# Patient Record
Sex: Female | Born: 1950 | Race: White | Hispanic: No | Smoking: Never smoker
Health system: Southern US, Community
[De-identification: ages and names within clinical notes are randomized; demographics above are authoritative.]

## PROBLEM LIST (undated history)

## (undated) DIAGNOSIS — C801 Malignant (primary) neoplasm, unspecified: Secondary | ICD-10-CM

## (undated) DIAGNOSIS — M810 Age-related osteoporosis without current pathological fracture: Secondary | ICD-10-CM

## (undated) DIAGNOSIS — K635 Polyp of colon: Secondary | ICD-10-CM

## (undated) DIAGNOSIS — D649 Anemia, unspecified: Secondary | ICD-10-CM

## (undated) DIAGNOSIS — H33329 Round hole, unspecified eye: Secondary | ICD-10-CM

## (undated) HISTORY — DX: Polyp of colon: K63.5

## (undated) HISTORY — PX: LAPAROSCOPIC HYSTERECTOMY: SHX1926

## (undated) HISTORY — PX: HAND / FINGER LESION EXCISION: SUR531

## (undated) HISTORY — DX: Age-related osteoporosis without current pathological fracture: M81.0

## (undated) HISTORY — PX: DILATION AND CURETTAGE OF UTERUS: SHX78

---

## 1978-03-24 HISTORY — PX: BILATERAL SALPINGECTOMY: SHX5743

## 1978-03-24 HISTORY — PX: TUBAL LIGATION: SHX77

## 1997-06-02 ENCOUNTER — Ambulatory Visit (HOSPITAL_COMMUNITY): Admission: RE | Admit: 1997-06-02 | Discharge: 1997-06-02 | Payer: Self-pay | Admitting: *Deleted

## 1998-07-20 ENCOUNTER — Other Ambulatory Visit: Admission: RE | Admit: 1998-07-20 | Discharge: 1998-07-20 | Payer: Self-pay | Admitting: *Deleted

## 1999-08-25 ENCOUNTER — Other Ambulatory Visit: Admission: RE | Admit: 1999-08-25 | Discharge: 1999-08-25 | Payer: Self-pay | Admitting: *Deleted

## 1999-12-02 ENCOUNTER — Emergency Department (HOSPITAL_COMMUNITY): Admission: EM | Admit: 1999-12-02 | Discharge: 1999-12-02 | Payer: Self-pay | Admitting: Emergency Medicine

## 2000-08-29 ENCOUNTER — Other Ambulatory Visit: Admission: RE | Admit: 2000-08-29 | Discharge: 2000-08-29 | Payer: Self-pay | Admitting: *Deleted

## 2001-09-30 ENCOUNTER — Other Ambulatory Visit: Admission: RE | Admit: 2001-09-30 | Discharge: 2001-09-30 | Payer: Self-pay | Admitting: Obstetrics and Gynecology

## 2002-07-14 ENCOUNTER — Ambulatory Visit (HOSPITAL_COMMUNITY): Admission: RE | Admit: 2002-07-14 | Discharge: 2002-07-14 | Payer: Self-pay | Admitting: Gastroenterology

## 2002-10-14 ENCOUNTER — Other Ambulatory Visit: Admission: RE | Admit: 2002-10-14 | Discharge: 2002-10-14 | Payer: Self-pay | Admitting: Obstetrics and Gynecology

## 2005-03-01 ENCOUNTER — Encounter: Admission: RE | Admit: 2005-03-01 | Discharge: 2005-03-01 | Payer: Self-pay | Admitting: Obstetrics and Gynecology

## 2006-01-29 ENCOUNTER — Encounter: Admission: RE | Admit: 2006-01-29 | Discharge: 2006-01-29 | Payer: Self-pay | Admitting: Obstetrics and Gynecology

## 2006-02-20 ENCOUNTER — Encounter: Admission: RE | Admit: 2006-02-20 | Discharge: 2006-02-20 | Payer: Self-pay | Admitting: Obstetrics and Gynecology

## 2007-02-27 ENCOUNTER — Observation Stay (HOSPITAL_COMMUNITY): Admission: EM | Admit: 2007-02-27 | Discharge: 2007-03-01 | Payer: Self-pay | Admitting: Emergency Medicine

## 2007-02-27 ENCOUNTER — Encounter (INDEPENDENT_AMBULATORY_CARE_PROVIDER_SITE_OTHER): Payer: Self-pay | Admitting: Surgery

## 2007-02-27 HISTORY — PX: APPENDECTOMY: SHX54

## 2007-04-01 ENCOUNTER — Encounter: Admission: RE | Admit: 2007-04-01 | Discharge: 2007-04-01 | Payer: Self-pay | Admitting: Obstetrics and Gynecology

## 2008-03-24 IMAGING — MG MM SCREEN MAMMOGRAM BILATERAL
5 series · 5 of 5 positions shown · non-contrast
Comparison: none

DG SCREEN MAMMOGRAM BILATERAL
Bilateral CC and MLO view(s) were taken.

DIGITAL SCREENING MAMMOGRAM WITH CAD:
The breast tissue is heterogeneously dense.  No masses or malignant type calcifications are 
identified.  Compared with prior studies.

[R CC]
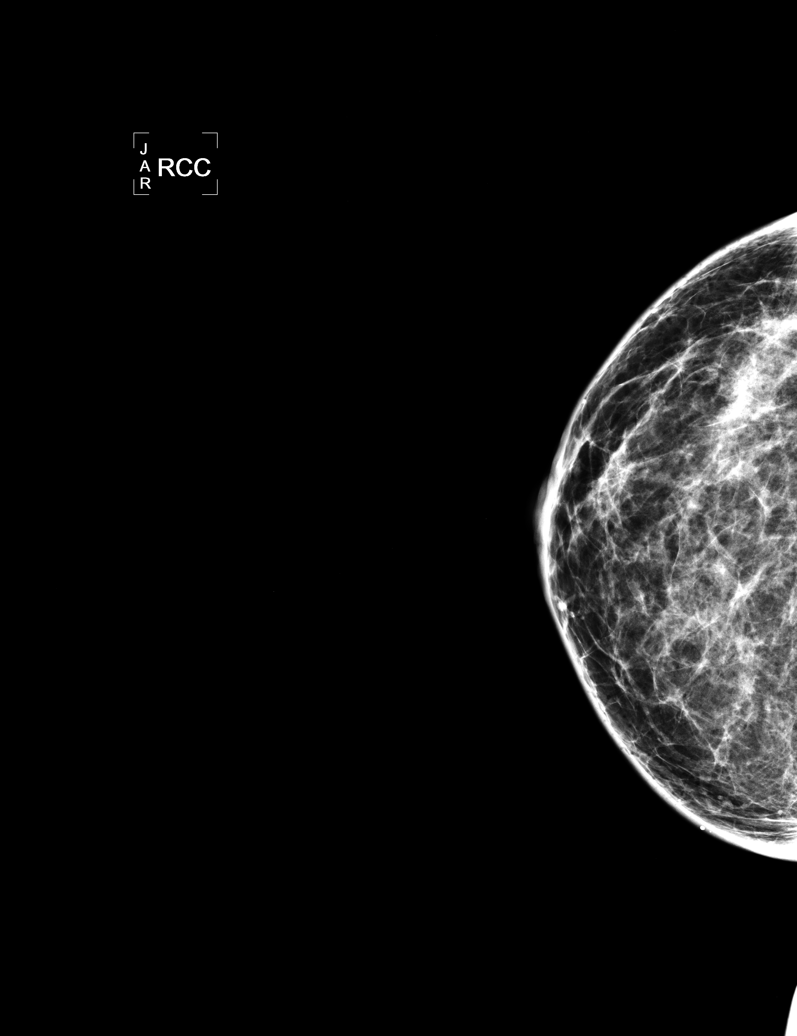

[L CC]
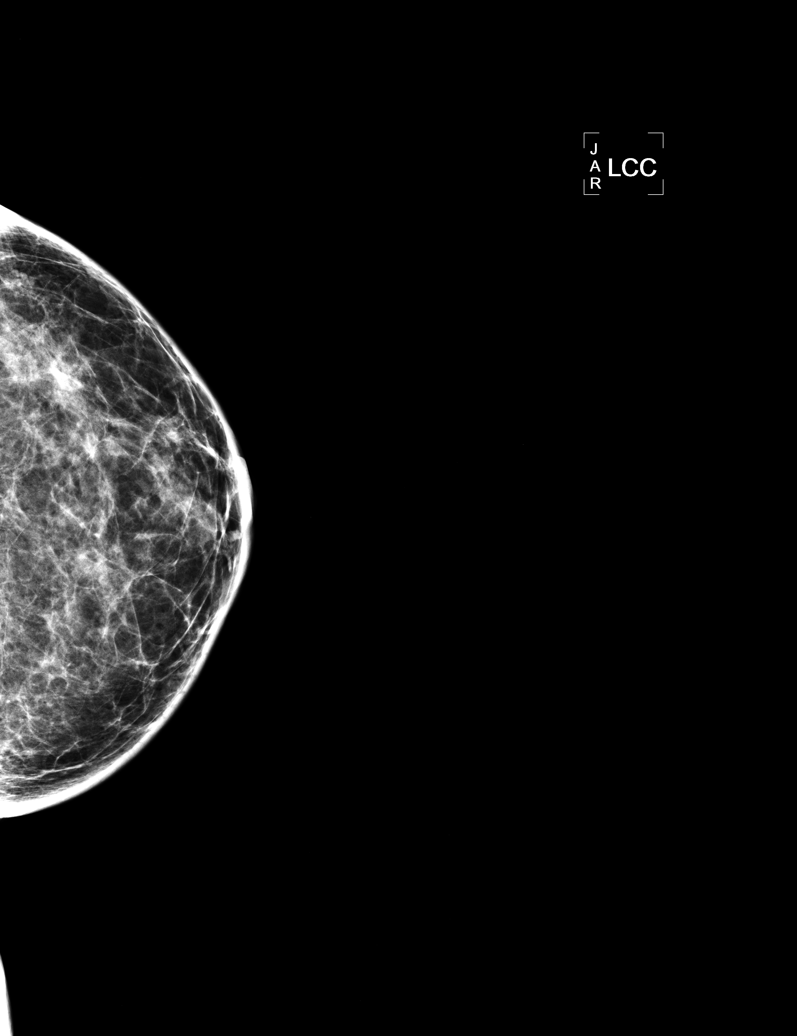

[L MLO (1 of 2)]
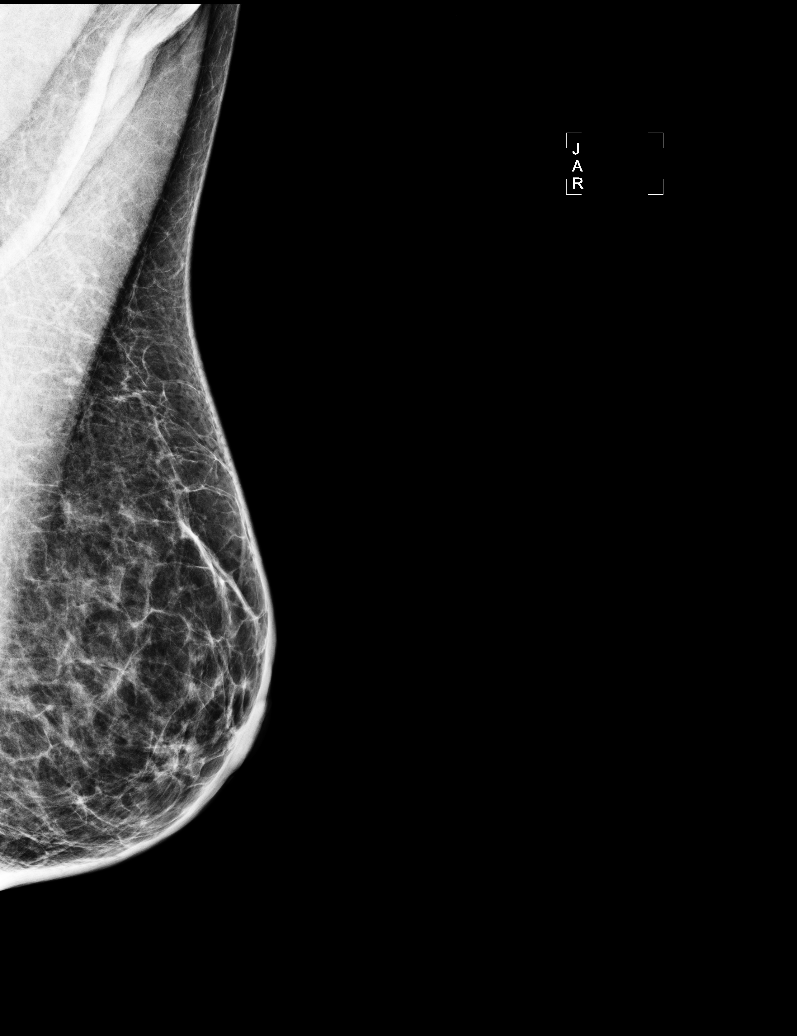

[R MLO]
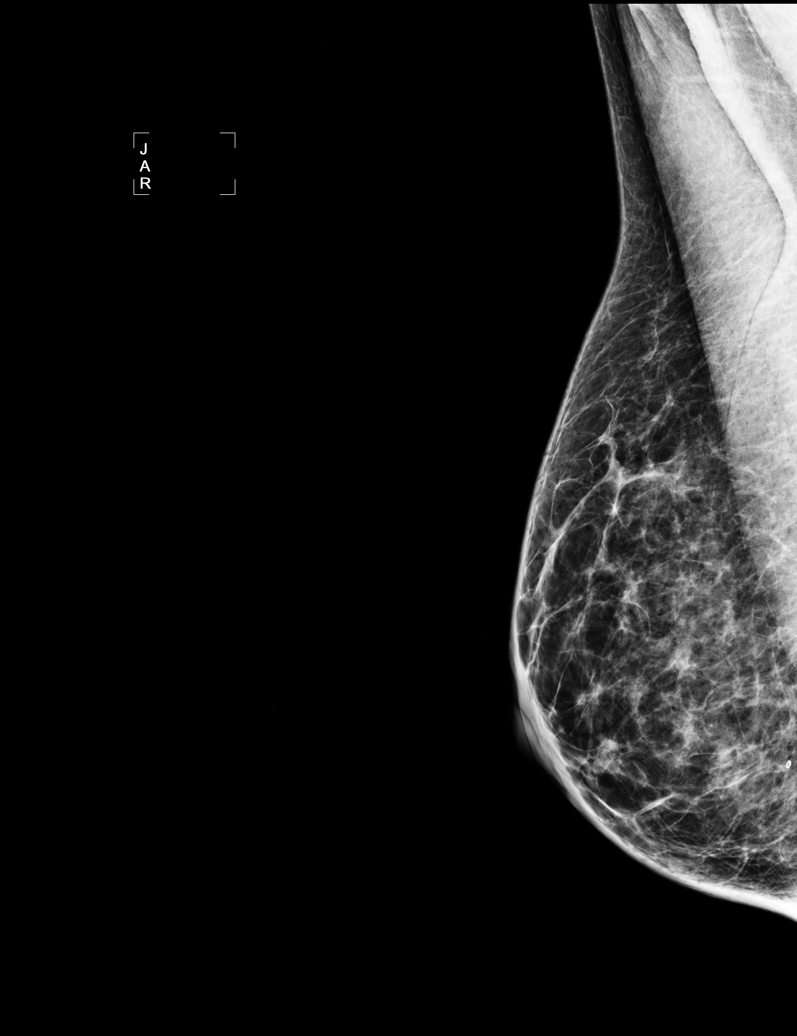

[L MLO (2 of 2)]
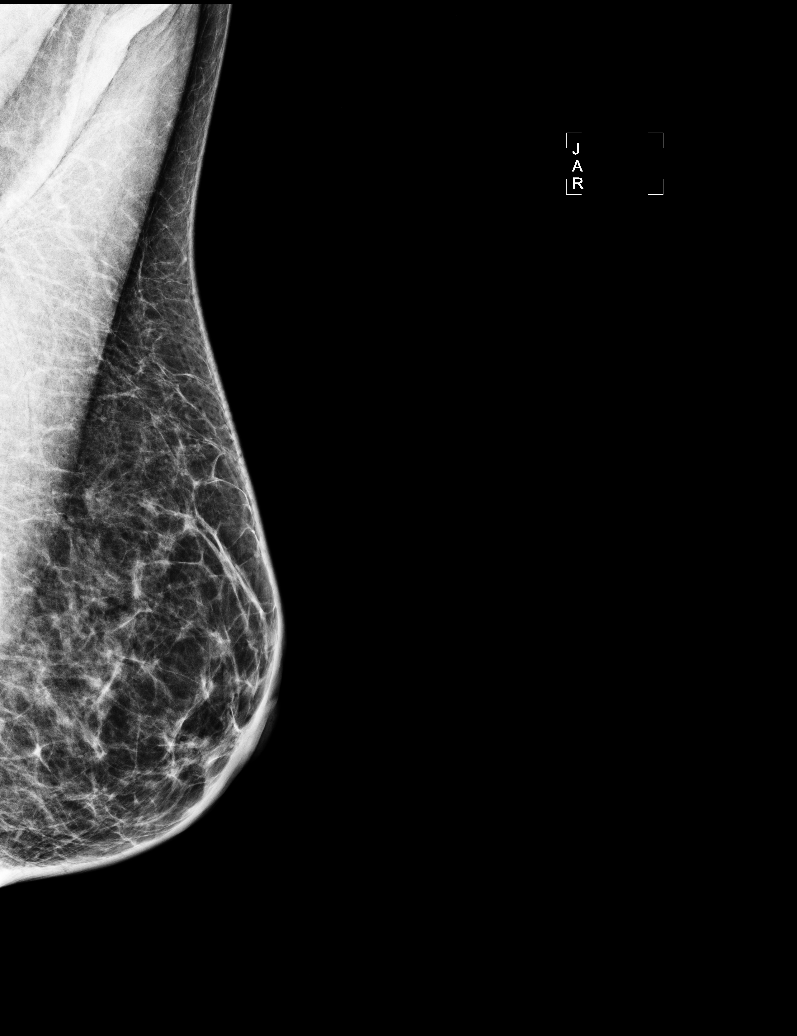

[5 of 5 positions shown; findings below may reference images not displayed]

IMPRESSION: No specific mammographic evidence of malignancy.  Next screening mammogram is recommended in one 
year.

ASSESSMENT: Negative - BI-RADS 1

Screening mammogram in 1 year.
ANALYZED BY COMPUTER AIDED DETECTION. , THIS PROCEDURE WAS A DIGITAL MAMMOGRAM.

## 2008-04-02 ENCOUNTER — Encounter: Admission: RE | Admit: 2008-04-02 | Discharge: 2008-04-02 | Payer: Self-pay | Admitting: Obstetrics and Gynecology

## 2009-04-06 ENCOUNTER — Encounter: Admission: RE | Admit: 2009-04-06 | Discharge: 2009-04-06 | Payer: Self-pay | Admitting: Obstetrics and Gynecology

## 2010-04-07 ENCOUNTER — Encounter
Admission: RE | Admit: 2010-04-07 | Discharge: 2010-04-07 | Payer: Self-pay | Source: Home / Self Care | Attending: Internal Medicine | Admitting: Internal Medicine

## 2010-05-14 ENCOUNTER — Encounter: Payer: Self-pay | Admitting: Obstetrics and Gynecology

## 2010-09-06 NOTE — Op Note (Signed)
NAMEDENA, ESPERANZA             ACCOUNT NO.:  [id number]   MEDICAL RECORD NO.:  [id number]          PATIENT TYPE:  INP   LOCATION:  0105                         FACILITY:  [facility]   PHYSICIAN:  Thornton Park. Daphine Deutscher, MD  DATE OF BIRTH:  Apr 16, 1951   DATE OF PROCEDURE:  02/27/2007  DATE OF DISCHARGE:                               OPERATIVE REPORT   PREOPERATIVE DIAGNOSIS:  Acute appendicitis.   POSTOPERATIVE DIAGNOSIS:  Acute suppurative appendicitis.   PROCEDURE:  Laparoscopic appendectomy.   SURGEON:  Thornton Park. Daphine Deutscher, M.D.   ANESTHESIA:  General.   DESCRIPTION OF PROCEDURE:  The patient was taken to room #1 the evening  of February 27, 2007 and given general anesthesia.  She received Mefoxin  1 gram preop.  The abdomen was prepped with Techni-Care and draped  sterilely.  A longitudinal incision was made down to the umbilicus  through which I inserted an applied medical Hassan-type trocar.  It was  insufflated.  Her cecum was noted to be very floppy and was very  distended, probably from her CT contrast.  Under laparoscopic vision, I  inserted a 5 mm trocar in the right upper quadrant and a 10/11 in the  left lower quadrant and then put the 0 degree scope in from the left  side.  The appendix itself was stuck and was fused to the wall on the  lateral aspect and I teased this up with a Kingsley Spittle and then was able to  identify the mesentery and went across that with the Harmonic scalpel.  This isolated the base and which I then transected with the Endo-GIA  with a vascular cartridge.  The appendix had one little attachment of  mesentery.  I transected that with the Harmonic and then placed the  appendix in a bag and brought it out through the umbilicus.  I inspected  it and then a small appendiceal stump seen both on the inside and there  was a small a suppurative appendix on the outside.  I irrigated the base  of the cecum and looked for evidence of bleeding and none was seen.  I   irrigated the pelvis and withdrew the irrigant.  Everything appeared  viable and there was no evidence of bleeding.  I then repaired the  umbilical defect with a figure-of-eight suture of 0 Vicryl and use a  laparoscope to check the area after it had been repaired.  I injected  the ports with Marcaine and then deflated the abdomen, closed skin with  4-0 Vicryl with Benzoin and Steri-Strips.  The patient tolerated  procedure well and was taken recovery room in satisfactory condition.      Thornton Park Daphine Deutscher, MD  Electronically Signed     MBM/MEDQ  D:  02/27/2007  T:  02/28/2007  Job:  [id number]

## 2010-09-09 NOTE — Op Note (Signed)
   NAME:  Robin Sellers, Robin Sellers                       ACCOUNT NO.:  [id number]   MEDICAL RECORD NO.:  [id number]                   PATIENT TYPE:  AMB   LOCATION:  ENDO                                 FACILITY:  [facility]   PHYSICIAN:  James L. Malon Kindle., M.D.          DATE OF BIRTH:  1951/04/05   DATE OF PROCEDURE:  07/14/2002  DATE OF DISCHARGE:                                 OPERATIVE REPORT   PROCEDURE:  Colonoscopy.   MEDICATIONS:  Fentanyl 75 mcg, Versed 6 mg IV.   INDICATIONS FOR PROCEDURE:  Heme positive stool in a 60 year old.   SCOPE:  Olympus pediatric colonoscope.   DESCRIPTION OF PROCEDURE:  The procedure had been explained to the patient  and consent obtained. With the patient in the left lateral decubitus  position, the pediatric video colonoscope was inserted and advanced under  direct visualization. The prep was excellent. We reached the transverse  colon without a great deal of difficulty, had trouble advancing from there  and we finally went ahead with the patient on her right side and advanced to  the cecum. The ileocecal valve and appendiceal orifice were seen. The  ileocecal valve was entered for a short distance and the terminal ileum  appeared normal. The scope was withdrawn and the cecum, ascending colon,  transverse colon, descending and sigmoid colon were seen well and no  significant diverticular disease.  No polyps were seen throughout. The scope  was withdrawn down to the rectum. The rectum was free of polyps. The scope  was retroflexed, there were some small internal hemorrhoids. The scope  withdrawn. The patient tolerated the procedure well.   ASSESSMENT:  Heme positive stool the only significant finding, internal  hemorrhoids.   PLAN:  Will give her a hemorrhoid instruction sheet, see back in the office  in three months and recheck her stools.                                                James L. Malon Kindle., M.D.    Waldron Session  D:  07/14/2002   T:  07/14/2002  Job:  [id number]   cc:   Gretta Arab. Valentina Lucks, M.D.  [address] [place]  [place] [address]  Fax: [phone]

## 2010-11-25 ENCOUNTER — Ambulatory Visit (HOSPITAL_BASED_OUTPATIENT_CLINIC_OR_DEPARTMENT_OTHER)
Admission: RE | Admit: 2010-11-25 | Discharge: 2010-11-25 | Disposition: A | Discharge status: Home / Self Care | Payer: 59 | Source: Ambulatory Visit | Attending: Orthopedic Surgery | Admitting: Orthopedic Surgery

## 2010-11-25 DIAGNOSIS — D1610 Benign neoplasm of short bones of unspecified upper limb: Secondary | ICD-10-CM | POA: Insufficient documentation

## 2010-11-25 DIAGNOSIS — M19049 Primary osteoarthritis, unspecified hand: Secondary | ICD-10-CM | POA: Insufficient documentation

## 2010-11-25 DIAGNOSIS — M6740 Ganglion, unspecified site: Secondary | ICD-10-CM | POA: Insufficient documentation

## 2010-11-29 NOTE — Op Note (Signed)
NAMEJAQUELINE, [name]             ACCOUNT NO.:  [id number]  MEDICAL RECORD NO.:  [id number]  LOCATION:                                 FACILITY:  PHYSICIAN:  Katy Fitch. Bertice Risse, M.D.      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  OPERATION:  Debridement of mucoid cyst followed by DIP joint arthrotomy, synovectomy, and osteophyte resection.  OPERATING SURGEON:  Katy Fitch. Zayed Griffie, MD  ASSISTANT:  Marveen Reeks Dasnoit, PA-C  ANESTHESIA:  2% lidocaine at metacarpal head level block, left index finger.  This was performed as a minor operating room procedure.  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  She has had prior drainage without relief of the cyst.  She has background osteoarthritis with marginal osteophyte formation at the base of the left index finger distal phalanx.  We explained her the spectrum of treatment options for mucoid cyst.  The highest level of success was achieved by DIP joint debridement with removal of osteophytes and joint irrigation.  Questions regarding the anticipated procedure were invited and answered in detail both in the office and in the holding area prior to surgery.  Questions were invited and answered.  PROCEDURE:  Robin Sellers was brought to room #1 of the [facility] and placed in supine position on the operating table.  Following informed consent and Betadine prep, 2% lidocaine was infiltrated at metacarpal head level to  obtain a digital block.  After 5 minutes, excellent anesthesia was achieved and confirmed by direct stimulation.  The left arm was then prepped with Betadine soap solution and sterilely draped.  Following a routine surgical time-out, the finger was exsanguinated with a gauze wrap and a 0.5-inch Penrose drain placed over the proximal phalangeal segment as digital tourniquet.  Procedure commenced with a curvilinear incision incorporating the cyst distally.  Subcutaneous tissues were carefully divided raising skin flaps allowing exposure of the mucin collection.  The mucin was debrided with a rongeur down to normal-appearing dermis.  A dorsoulnar arthrotomy was accomplished by resection of a triangular portion of the capsule between the terminal extensor tendon slip and the ulnar collateral ligament.  A large osteophyte at the base of distal phalanx was immediately evident.  This was removed piecemeal with a rongeur and a microcurette.  A micro-Freer elevator was used to palpate deep to the extensor mechanism looking for cartilaginous loose bodies.  The joint was then irrigated with a blunt dental needle and syringe under pressure.  Hemostasis was achieved followed by repair of the wound with mattress suture of 5-0 nylon.  There were no apparent complications.  Robin Sellers tolerated the surgery and anesthesia well.  For aftercare, she is provided prescriptions for Keflex 500 mg one p.o. q.8 h. x4 days as a prophylactic  antibiotic with the indication for antibiotics joint entry.  She is also provided Vicodin 5 mg one p.o. q. 4-6 h. p.r.n. pain, 20 tablets without refill.     Katy Fitch Wanita Derenzo, M.D.     RVS/MEDQ  D:  11/25/2010  T:  11/25/2010  Job:  [id number]  Electronically Signed by Josephine Igo M.D. on 11/29/2010 08:22:17 AM

## 2011-01-31 LAB — COMPREHENSIVE METABOLIC PANEL
ALT: 13
AST: 19
Albumin: 4.2
Alkaline Phosphatase: 36 — ABNORMAL LOW
BUN: 14
CO2: 28
Calcium: 9.6
Chloride: 102
Creatinine, Ser: 0.82
GFR calc Af Amer: >60
GFR calc non Af Amer: >60
Glucose, Bld: 128 — ABNORMAL HIGH
Potassium: 3.8
Sodium: 137
Total Bilirubin: 1.3 — ABNORMAL HIGH
Total Protein: 7.1

## 2011-01-31 LAB — CBC
HCT: 39.7
Hemoglobin: 13.7
MCHC: 34.5
MCV: 88.4
Platelets: 181
RBC: 4.5
RDW: 13.5
WBC: 20.2 — ABNORMAL HIGH

## 2011-01-31 LAB — DIFFERENTIAL
Basophils Absolute: 0.1
Basophils Relative: 1
Eosinophils Absolute: 0
Eosinophils Relative: 0
Lymphocytes Relative: 3 — ABNORMAL LOW
Lymphs Abs: 0.6 — ABNORMAL LOW
Monocytes Absolute: 0.9 — ABNORMAL HIGH
Monocytes Relative: 4
Neutro Abs: 18.6 — ABNORMAL HIGH
Neutrophils Relative %: 92 — ABNORMAL HIGH

## 2011-02-27 ENCOUNTER — Other Ambulatory Visit: Payer: Self-pay | Admitting: Internal Medicine

## 2011-02-27 DIAGNOSIS — Z1231 Encounter for screening mammogram for malignant neoplasm of breast: Secondary | ICD-10-CM

## 2011-04-11 ENCOUNTER — Ambulatory Visit
Admission: RE | Admit: 2011-04-11 | Discharge: 2011-04-11 | Disposition: A | Discharge status: Home / Self Care | Payer: 59 | Source: Ambulatory Visit | Attending: Internal Medicine | Admitting: Internal Medicine

## 2011-04-11 DIAGNOSIS — Z1231 Encounter for screening mammogram for malignant neoplasm of breast: Secondary | ICD-10-CM

## 2011-04-25 HISTORY — PX: RETINAL TEAR REPAIR CRYOTHERAPY: SHX5304

## 2011-10-31 HISTORY — PX: CATARACT EXTRACTION: SUR2

## 2012-03-07 ENCOUNTER — Other Ambulatory Visit: Payer: Self-pay | Admitting: Internal Medicine

## 2012-03-07 DIAGNOSIS — Z1231 Encounter for screening mammogram for malignant neoplasm of breast: Secondary | ICD-10-CM

## 2012-04-01 ENCOUNTER — Ambulatory Visit: Payer: 59

## 2012-04-03 ENCOUNTER — Ambulatory Visit
Admission: RE | Admit: 2012-04-03 | Discharge: 2012-04-03 | Disposition: A | Discharge status: Home / Self Care | Payer: 59 | Source: Ambulatory Visit | Attending: Internal Medicine | Admitting: Internal Medicine

## 2012-04-03 DIAGNOSIS — Z1231 Encounter for screening mammogram for malignant neoplasm of breast: Secondary | ICD-10-CM

## 2012-04-12 ENCOUNTER — Other Ambulatory Visit: Payer: Self-pay | Admitting: Obstetrics and Gynecology

## 2012-04-12 DIAGNOSIS — M8580 Other specified disorders of bone density and structure, unspecified site: Secondary | ICD-10-CM

## 2013-01-07 ENCOUNTER — Other Ambulatory Visit: Payer: 59

## 2013-02-12 ENCOUNTER — Ambulatory Visit
Admission: RE | Admit: 2013-02-12 | Discharge: 2013-02-12 | Disposition: A | Discharge status: Home / Self Care | Payer: 59 | Source: Ambulatory Visit | Attending: Obstetrics and Gynecology | Admitting: Obstetrics and Gynecology

## 2013-02-12 DIAGNOSIS — M8580 Other specified disorders of bone density and structure, unspecified site: Secondary | ICD-10-CM

## 2013-02-24 ENCOUNTER — Other Ambulatory Visit: Payer: Self-pay

## 2013-02-24 DIAGNOSIS — Z1231 Encounter for screening mammogram for malignant neoplasm of breast: Secondary | ICD-10-CM

## 2013-04-04 ENCOUNTER — Ambulatory Visit
Admission: RE | Admit: 2013-04-04 | Discharge: 2013-04-04 | Disposition: A | Discharge status: Home / Self Care | Payer: 59 | Source: Ambulatory Visit

## 2013-04-04 DIAGNOSIS — Z1231 Encounter for screening mammogram for malignant neoplasm of breast: Secondary | ICD-10-CM

## 2014-03-03 ENCOUNTER — Other Ambulatory Visit: Payer: Self-pay

## 2014-03-03 DIAGNOSIS — Z1231 Encounter for screening mammogram for malignant neoplasm of breast: Secondary | ICD-10-CM

## 2014-04-06 ENCOUNTER — Ambulatory Visit
Admission: RE | Admit: 2014-04-06 | Discharge: 2014-04-06 | Disposition: A | Discharge status: Home / Self Care | Payer: BC Managed Care – PPO | Source: Ambulatory Visit

## 2014-04-06 DIAGNOSIS — Z1231 Encounter for screening mammogram for malignant neoplasm of breast: Secondary | ICD-10-CM

## 2015-03-01 ENCOUNTER — Other Ambulatory Visit: Payer: Self-pay

## 2015-03-01 DIAGNOSIS — Z1231 Encounter for screening mammogram for malignant neoplasm of breast: Secondary | ICD-10-CM

## 2015-04-08 ENCOUNTER — Ambulatory Visit
Admission: RE | Admit: 2015-04-08 | Discharge: 2015-04-08 | Disposition: A | Discharge status: Home / Self Care | Payer: PRIVATE HEALTH INSURANCE | Source: Ambulatory Visit

## 2015-04-08 DIAGNOSIS — Z1231 Encounter for screening mammogram for malignant neoplasm of breast: Secondary | ICD-10-CM

## 2016-02-21 ENCOUNTER — Other Ambulatory Visit: Payer: Self-pay | Admitting: Internal Medicine

## 2016-02-21 DIAGNOSIS — Z1231 Encounter for screening mammogram for malignant neoplasm of breast: Secondary | ICD-10-CM

## 2016-04-11 ENCOUNTER — Ambulatory Visit
Admission: RE | Admit: 2016-04-11 | Discharge: 2016-04-11 | Disposition: A | Discharge status: Home / Self Care | Payer: PRIVATE HEALTH INSURANCE | Source: Ambulatory Visit | Attending: Internal Medicine | Admitting: Internal Medicine

## 2016-04-11 DIAGNOSIS — Z1231 Encounter for screening mammogram for malignant neoplasm of breast: Secondary | ICD-10-CM

## 2017-02-14 ENCOUNTER — Other Ambulatory Visit: Payer: Self-pay | Admitting: Internal Medicine

## 2017-02-14 DIAGNOSIS — Z1231 Encounter for screening mammogram for malignant neoplasm of breast: Secondary | ICD-10-CM

## 2017-04-04 ENCOUNTER — Other Ambulatory Visit: Payer: Self-pay | Admitting: Obstetrics and Gynecology

## 2017-04-05 ENCOUNTER — Other Ambulatory Visit: Payer: Self-pay | Admitting: Obstetrics and Gynecology

## 2017-04-05 DIAGNOSIS — M810 Age-related osteoporosis without current pathological fracture: Secondary | ICD-10-CM

## 2017-04-12 ENCOUNTER — Ambulatory Visit
Admission: RE | Admit: 2017-04-12 | Discharge: 2017-04-12 | Disposition: A | Discharge status: Home / Self Care | Payer: Medicare Other | Source: Ambulatory Visit | Attending: Internal Medicine | Admitting: Internal Medicine

## 2017-04-12 DIAGNOSIS — Z1231 Encounter for screening mammogram for malignant neoplasm of breast: Secondary | ICD-10-CM

## 2018-03-08 ENCOUNTER — Other Ambulatory Visit: Payer: Self-pay | Admitting: Internal Medicine

## 2018-03-08 DIAGNOSIS — Z1231 Encounter for screening mammogram for malignant neoplasm of breast: Secondary | ICD-10-CM

## 2018-03-14 ENCOUNTER — Other Ambulatory Visit: Payer: Self-pay | Admitting: Gastroenterology

## 2018-04-01 ENCOUNTER — Ambulatory Visit (HOSPITAL_COMMUNITY)
Admission: RE | Admit: 2018-04-01 | Discharge: 2018-04-01 | Disposition: A | Discharge status: Home / Self Care | Payer: Medicare Other | Source: Ambulatory Visit | Attending: Gastroenterology | Admitting: Gastroenterology

## 2018-04-01 ENCOUNTER — Other Ambulatory Visit: Payer: Self-pay

## 2018-04-01 ENCOUNTER — Encounter (HOSPITAL_COMMUNITY): Payer: Self-pay | Admitting: Emergency Medicine

## 2018-04-01 ENCOUNTER — Encounter (HOSPITAL_COMMUNITY)
Admission: RE | Disposition: A | Discharge status: Home / Self Care | Payer: Self-pay | Source: Ambulatory Visit | Attending: Gastroenterology

## 2018-04-01 ENCOUNTER — Ambulatory Visit (HOSPITAL_COMMUNITY): Payer: Medicare Other | Admitting: Certified Registered"

## 2018-04-01 DIAGNOSIS — D122 Benign neoplasm of ascending colon: Secondary | ICD-10-CM | POA: Diagnosis not present

## 2018-04-01 DIAGNOSIS — D120 Benign neoplasm of cecum: Secondary | ICD-10-CM | POA: Diagnosis not present

## 2018-04-01 DIAGNOSIS — Z86010 Personal history of colonic polyps: Secondary | ICD-10-CM | POA: Insufficient documentation

## 2018-04-01 DIAGNOSIS — K621 Rectal polyp: Secondary | ICD-10-CM | POA: Diagnosis not present

## 2018-04-01 DIAGNOSIS — Z79899 Other long term (current) drug therapy: Secondary | ICD-10-CM | POA: Insufficient documentation

## 2018-04-01 HISTORY — PX: POLYPECTOMY: SHX5525

## 2018-04-01 HISTORY — PX: COLONOSCOPY WITH PROPOFOL: SHX5780

## 2018-04-01 HISTORY — PX: SUBMUCOSAL INJECTION: SHX5543

## 2018-04-01 SURGERY — COLONOSCOPY WITH PROPOFOL
Anesthesia: Monitor Anesthesia Care

## 2018-04-01 MED ORDER — PROPOFOL 10 MG/ML IV BOLUS
INTRAVENOUS | Status: AC
  Filled 2018-04-01: qty 80

## 2018-04-01 MED ORDER — SODIUM CHLORIDE 0.9 % IV SOLN: INTRAVENOUS | Status: DC

## 2018-04-01 MED ORDER — PROPOFOL 500 MG/50ML IV EMUL
INTRAVENOUS | Status: DC | PRN
  Administered 2018-04-01: 135 ug/kg/min via INTRAVENOUS

## 2018-04-01 MED ORDER — SPOT INK MARKER SYRINGE KIT
PACK | SUBMUCOSAL | Status: AC
  Filled 2018-04-01: qty 5

## 2018-04-01 MED ORDER — SPOT INK MARKER SYRINGE KIT
PACK | SUBMUCOSAL | Status: DC | PRN
  Administered 2018-04-01: 2 mL via SUBMUCOSAL

## 2018-04-01 MED ORDER — PROPOFOL 10 MG/ML IV BOLUS
INTRAVENOUS | Status: DC | PRN
  Administered 2018-04-01 (×2): 20 mg via INTRAVENOUS
  Administered 2018-04-01: 40 mg via INTRAVENOUS

## 2018-04-01 MED ORDER — LACTATED RINGERS IV SOLN
INTRAVENOUS | Status: DC
  Administered 2018-04-01: 08:00:00 via INTRAVENOUS

## 2018-04-01 SURGICAL SUPPLY — 22 items

## 2018-04-01 NOTE — Transfer of Care (Signed)
Immediate Anesthesia Transfer of Care Note  Patient: Robin Sellers  Procedure(s) Performed: COLONOSCOPY WITH PROPOFOL (N/A ) POLYPECTOMY  Patient Location: PACU  Anesthesia Type:MAC  Level of Consciousness: awake, alert  and oriented  Airway & Oxygen Therapy: Patient Spontanous Breathing and Patient connected to face mask oxygen  Post-op Assessment: Report given to RN and Post -op Vital signs reviewed and stable  Post vital signs: Reviewed and stable  Last Vitals:  Vitals Value Taken Time  BP 108/36 04/01/2018  9:46 AM  Temp    Pulse    Resp 17 04/01/2018  9:47 AM  SpO2    Vitals shown include unvalidated device data.  Last Pain:  Vitals:   04/01/18 0726  TempSrc: Oral         Complications: No apparent anesthesia complications

## 2018-04-01 NOTE — Anesthesia Procedure Notes (Signed)
Date/Time: 04/01/2018 8:33 AM Performed by: Niel Hummer, CRNA Pre-anesthesia Checklist: Patient being monitored, Suction available, Emergency Drugs available and Patient identified Oxygen Delivery Method: Nasal cannula

## 2018-04-01 NOTE — Op Note (Signed)
[facility] Patient Name: Robin Sellers Procedure Date: 04/01/2018 MRN: [id number] Attending MD: Otis Brace , MD Date of Birth: March 23, 1951 CSN: [id number] Age: 67 Admit Type: Outpatient Procedure:                Colonoscopy Indications:              Therapeutic procedure for known colon polyps Providers:                Otis Brace, MD, Cleda Daub, RN, Cherylynn Ridges, Technician Referring MD:              Medicines:                Sedation Administered by an Anesthesia Professional Complications:            No immediate complications. Estimated Blood Loss:     Estimated blood loss was minimal. Procedure:                Pre-Anesthesia Assessment:                           - Prior to the procedure, a History and Physical                            was performed, and patient medications and                            allergies were reviewed. The patient's tolerance of                            previous anesthesia was also reviewed. The risks                            and benefits of the procedure and the sedation                            options and risks were discussed with the patient.                            All questions were answered, and informed consent                            was obtained. Prior Anticoagulants: The patient has                            taken no previous anticoagulant or antiplatelet                            agents. ASA Grade Assessment: II - A patient with                            mild systemic disease. After reviewing the risks  and benefits, the patient was deemed in                            satisfactory condition to undergo the procedure.                           After obtaining informed consent, the colonoscope                            was passed under direct vision. Throughout the                            procedure, the patient's blood pressure, pulse,  and                            oxygen saturations were monitored continuously. The                            PCF-H190DL ([id number]) Olympus peds colonscope was                            introduced through the anus and advanced to the the                            cecum, identified by appendiceal orifice and                            ileocecal valve. The colonoscopy was performed                            without difficulty. The patient tolerated the                            procedure well. The quality of the bowel                            preparation was good. Scope In: 8:41:10 AM Scope Out: 9:39:55 AM Scope Withdrawal Time: 0 hours 51 minutes 3 seconds  Total Procedure Duration: 0 hours 58 minutes 45 seconds  Findings:      The perianal and digital rectal examinations were normal.      moderately friable mucosa with contact bleeding was found in the entire       colon.      A 4 mm polyp was found in the cecum. The polyp was sessile. The polyp       was removed with a hot snare. Resection and retrieval were complete.      A 20 mm polyp was found in the proximal ascending colon. The polyp was       sessile. Area was unsuccessfully injected with Eleview for a lift       polypectomy. The polyp was removed with a piecemeal technique using a       hot snare. Resection and retrieval were complete. To close a defect       after polypectomy, three hemostatic clips were successfully placed. Area       was tattooed  with an injection of Spot (carbon black).      A 10 mm polyp was found in the distal ascending colon. The polyp was       sessile. The polyp was removed with a piecemeal technique using a hot       snare. Resection and retrieval were complete.      A 7 mm polyp was found in the rectum. The polyp was sessile. The polyp       was removed with a hot snare. Resection and retrieval were complete.      The retroflexed view of the distal rectum and anal verge was normal and        showed no anal or rectal abnormalities. Impression:               - One 4 mm polyp in the cecum, removed with a hot                            snare. Resected and retrieved.                           - One 20 mm polyp in the proximal ascending colon,                            removed piecemeal using a hot snare. Resected and                            retrieved. Treatment not successful. Clips were                            placed. Tattooed.                           - One 10 mm polyp in the distal ascending colon,                            removed piecemeal using a hot snare. Resected and                            retrieved.                           - One 7 mm polyp in the rectum, removed with a hot                            snare. Resected and retrieved. Moderate Sedation:      Moderate (conscious) sedation was personally administered by an       anesthesia professional. The following parameters were monitored: oxygen       saturation, heart rate, blood pressure, and response to care. Recommendation:           - Patient has a contact number available for                            emergencies. The signs and symptoms of potential  delayed complications were discussed with the                            patient. Return to normal activities tomorrow.                            Written discharge instructions were provided to the                            patient.                           - Resume previous diet.                           - Continue present medications.                           - Await pathology results.                           - Repeat colonoscopy date to be determined after                            pending pathology results are reviewed for                            surveillance based on pathology results.                           - Return to my office PRN. Procedure Code(s):        --- Professional ---                            [id number], Colonoscopy, flexible; with removal of                            tumor(s), polyp(s), or other lesion(s) by snare                            technique                           45381, Colonoscopy, flexible; with directed                            submucosal injection(s), any substance Diagnosis Code(s):        --- Professional ---                           D12.0, Benign neoplasm of cecum                           D12.2, Benign neoplasm of ascending colon                           K62.1, Rectal polyp  K63.5, Polyp of colon CPT copyright 2018 American Medical Association. All rights reserved. The codes documented in this report are preliminary and upon coder review may  be revised to meet current compliance requirements. Otis Brace, MD Otis Brace, MD 04/01/2018 9:49:46 AM Number of Addenda: 0

## 2018-04-01 NOTE — H&P (Signed)
Referring Provider:  Oletta Lamas Primary Care Physician:  Lorene Dy, MD Primary Gastroenterologist:  Dr. Oletta Lamas   Reason for Visit : Large Ascending colon polyp   HPI: Robin Sellers is a 67 y.o. female underwent surveillance colonoscopy on February 28, 2018 with Dr. Oletta Lamas and was found to have multiple polyps.  There was one large, 20 mm, sessile polyp in the ascending colon which was not removed.  She is undergoing another colonoscopy today for removal of large polyp.  She denies any GI symptoms today.  History reviewed. No pertinent past medical history.  History reviewed. No pertinent surgical history.  Prior to Admission medications   Medication Sig Start Date End Date Taking? Authorizing Provider  Ascorbic Acid (VITAMIN C) 1000 MG tablet Take 1,000 mg by mouth 4 (four) times a week.   Yes [provider]  Calcium Carb-Cholecalciferol (CALCIUM+D3 PO) Take 1 tablet by mouth 4 (four) times a week.   Yes [provider]  Cholecalciferol (D3 SUPER STRENGTH) 50 MCG (2000 UT) CAPS Take 2,000 Units by mouth 4 (four) times a week.   Yes [provider]  Multiple Vitamin (MULTIVITAMIN WITH MINERALS) TABS tablet Take 1 tablet by mouth 4 (four) times a week. WITH BIOTIN   Yes [provider]    Scheduled Meds: Continuous Infusions: . sodium chloride    . lactated ringers 125 mL/hr at 04/01/18 0741   PRN Meds:.  Allergies as of 03/14/2018  . (Not on File)    Family History  Problem Relation Age of Onset  . Breast cancer Neg Hx     Social History   Socioeconomic History  . Marital status: Legally Separated    Spouse name: Not on file  . Number of children: Not on file  . Years of education: Not on file  . Highest education level: Not on file  Occupational History  . Not on file  Social Needs  . Financial resource strain: Not on file  . Food insecurity:    Worry: Not on file    Inability: Not on file  . Transportation needs:     Medical: Not on file    Non-medical: Not on file  Tobacco Use  . Smoking status: Not on file  Substance and Sexual Activity  . Alcohol use: Not on file  . Drug use: Not on file  . Sexual activity: Not on file  Lifestyle  . Physical activity:    Days per week: Not on file    Minutes per session: Not on file  . Stress: Not on file  Relationships  . Social connections:    Talks on phone: Not on file    Gets together: Not on file    Attends religious service: Not on file    Active member of club or organization: Not on file    Attends meetings of clubs or organizations: Not on file    Relationship status: Not on file  . Intimate partner violence:    Fear of current or ex partner: Not on file    Emotionally abused: Not on file    Physically abused: Not on file    Forced sexual activity: Not on file  Other Topics Concern  . Not on file  Social History Narrative  . Not on file    Review of Systems: All negative except as stated above in HPI.  Physical Exam: Vital signs: Vitals:   04/01/18 0726  BP: (!) 119/56  Pulse: 68  Resp: 10  Temp:  98.8 F (37.1 C)  SpO2: 94%     General:   Alert,  Well-developed, well-nourished, pleasant and cooperative in NAD Lungs:  Clear throughout to auscultation.   No wheezes, crackles, or rhonchi. No acute distress. Heart:  Regular rate and rhythm; no murmurs, clicks, rubs,  or gallops. Abdomen: Soft, nontender, nondistended, bowel sounds present. Rectal:  Deferred  GI:  Lab Results: No results for input(s): WBC, HGB, HCT, PLT in the last 72 hours. BMET No results for input(s): NA, K, CL, CO2, GLUCOSE, BUN, CREATININE, CALCIUM in the last 72 hours. LFT No results for input(s): PROT, ALBUMIN, AST, ALT, ALKPHOS, BILITOT, BILIDIR, IBILI in the last 72 hours. PT/INR No results for input(s): LABPROT, INR in the last 72 hours.   Studies/Results: No results found.  Impression/Plan: -Large ascending colon  polyp.  Recommendations ------------------------- - Colonoscopy  with possible removal of large polyps today. -Increased risk of bleeding and perforation associated with removal of large polyp discussed with the patient.  Possibility of incomplete resection also discussed with the patient.  If not able to remove polyp completely, she may need to go to tertiary center for complete resection.  Risks (bleeding, infection, bowel perforation that could require surgery, sedation-related changes in cardiopulmonary systems), benefits (identification and possible treatment of source of symptoms, exclusion of certain causes of symptoms), and alternatives (watchful waiting, radiographic imaging studies, empiric medical treatment)  were explained to patient in detail and patient wishes to proceed.    LOS: 0 days   Otis Brace  MD, FACP 04/01/2018, 8:23 AM  Contact #  [phone]

## 2018-04-01 NOTE — Discharge Instructions (Signed)

## 2018-04-01 NOTE — Anesthesia Preprocedure Evaluation (Signed)
Anesthesia Evaluation  Patient identified by MRN, date of birth, ID band Patient awake    Reviewed: Allergy & Precautions, NPO status , Patient's Chart, lab work & pertinent test results  Airway Mallampati: I  TM Distance: >3 FB Neck ROM: Full    Dental  (+) Teeth Intact, Dental Advisory Given   Pulmonary neg pulmonary ROS,    Pulmonary exam normal breath sounds clear to auscultation       Cardiovascular Exercise Tolerance: Good negative cardio ROS Normal cardiovascular exam Rhythm:Regular Rate:Normal     Neuro/Psych negative neurological ROS  negative psych ROS   GI/Hepatic Neg liver ROS, Large Polyp of colon   Endo/Other  negative endocrine ROS  Renal/GU negative Renal ROS     Musculoskeletal negative musculoskeletal ROS (+)   Abdominal   Peds  Hematology negative hematology ROS (+)   Anesthesia Other Findings Day of surgery medications reviewed with the patient.  Reproductive/Obstetrics                             Anesthesia Physical Anesthesia Plan  ASA: I  Anesthesia Plan: MAC   Post-op Pain Management:    Induction: Intravenous  PONV Risk Score and Plan: 2 and Propofol infusion and Treatment may vary due to age or medical condition  Airway Management Planned: Nasal Cannula and Natural Airway  Additional Equipment:   Intra-op Plan:   Post-operative Plan:   Informed Consent: I have reviewed the patients History and Physical, chart, labs and discussed the procedure including the risks, benefits and alternatives for the proposed anesthesia with the patient or authorized representative who has indicated his/her understanding and acceptance.   Dental advisory given  Plan Discussed with: CRNA and Anesthesiologist  Anesthesia Plan Comments:         Anesthesia Quick Evaluation

## 2018-04-02 NOTE — Anesthesia Postprocedure Evaluation (Signed)
Anesthesia Post Note  Patient: Robin Sellers  Procedure(s) Performed: COLONOSCOPY WITH PROPOFOL (N/A ) POLYPECTOMY SUBMUCOSAL INJECTION HEMOSTASIS CLIP PLACEMENT     Patient location during evaluation: PACU Anesthesia Type: MAC Level of consciousness: awake and alert, awake and oriented Pain management: pain level controlled Vital Signs Assessment: post-procedure vital signs reviewed and stable Respiratory status: spontaneous breathing, nonlabored ventilation and respiratory function stable Cardiovascular status: stable and blood pressure returned to baseline Postop Assessment: no apparent nausea or vomiting Anesthetic complications: no    Last Vitals:  Vitals:   04/01/18 0950 04/01/18 1000  BP: (!) 113/56 (!) 120/52  Pulse: (!) 57 (!) 53  Resp: 18 17  Temp:    SpO2: 100% 100%    Last Pain:  Vitals:   04/01/18 1000  TempSrc:   PainSc: 0-No pain                 Catalina Gravel

## 2018-04-04 ENCOUNTER — Encounter (HOSPITAL_COMMUNITY): Payer: Self-pay | Admitting: Gastroenterology

## 2018-04-23 ENCOUNTER — Ambulatory Visit
Admission: RE | Admit: 2018-04-23 | Discharge: 2018-04-23 | Disposition: A | Discharge status: Home / Self Care | Payer: Medicare Other | Source: Ambulatory Visit | Attending: Internal Medicine | Admitting: Internal Medicine

## 2018-04-23 DIAGNOSIS — Z1231 Encounter for screening mammogram for malignant neoplasm of breast: Secondary | ICD-10-CM

## 2019-03-14 ENCOUNTER — Other Ambulatory Visit: Payer: Self-pay | Admitting: Internal Medicine

## 2019-03-14 DIAGNOSIS — Z1231 Encounter for screening mammogram for malignant neoplasm of breast: Secondary | ICD-10-CM

## 2019-03-24 ENCOUNTER — Other Ambulatory Visit: Payer: Self-pay | Admitting: Internal Medicine

## 2019-03-24 DIAGNOSIS — E2839 Other primary ovarian failure: Secondary | ICD-10-CM

## 2019-03-25 HISTORY — PX: COLONOSCOPY: SHX174

## 2019-05-08 ENCOUNTER — Ambulatory Visit
Admission: RE | Admit: 2019-05-08 | Discharge: 2019-05-08 | Disposition: A | Discharge status: Home / Self Care | Payer: Medicare Other | Source: Ambulatory Visit | Attending: Internal Medicine | Admitting: Internal Medicine

## 2019-05-08 ENCOUNTER — Other Ambulatory Visit: Payer: Self-pay

## 2019-05-08 DIAGNOSIS — Z1231 Encounter for screening mammogram for malignant neoplasm of breast: Secondary | ICD-10-CM

## 2019-06-19 ENCOUNTER — Other Ambulatory Visit: Payer: Self-pay

## 2019-06-19 ENCOUNTER — Ambulatory Visit
Admission: RE | Admit: 2019-06-19 | Discharge: 2019-06-19 | Disposition: A | Discharge status: Home / Self Care | Payer: Medicare Other | Source: Ambulatory Visit | Attending: Internal Medicine | Admitting: Internal Medicine

## 2019-06-19 DIAGNOSIS — E2839 Other primary ovarian failure: Secondary | ICD-10-CM

## 2020-03-29 ENCOUNTER — Other Ambulatory Visit: Payer: Self-pay | Admitting: Internal Medicine

## 2020-03-29 DIAGNOSIS — Z1231 Encounter for screening mammogram for malignant neoplasm of breast: Secondary | ICD-10-CM

## 2020-05-12 ENCOUNTER — Other Ambulatory Visit: Payer: Self-pay

## 2020-05-12 ENCOUNTER — Ambulatory Visit
Admission: RE | Admit: 2020-05-12 | Discharge: 2020-05-12 | Disposition: A | Discharge status: Home / Self Care | Payer: Medicare Other | Source: Ambulatory Visit | Attending: Internal Medicine | Admitting: Internal Medicine

## 2020-05-12 DIAGNOSIS — Z1231 Encounter for screening mammogram for malignant neoplasm of breast: Secondary | ICD-10-CM

## 2020-08-21 ENCOUNTER — Other Ambulatory Visit: Payer: Self-pay

## 2020-08-21 ENCOUNTER — Emergency Department (HOSPITAL_COMMUNITY): Payer: Medicare Other

## 2020-08-21 ENCOUNTER — Emergency Department (HOSPITAL_COMMUNITY)
Admission: EM | Admit: 2020-08-21 | Discharge: 2020-08-22 | Disposition: A | Discharge status: Home / Self Care | Payer: Medicare Other | Attending: Emergency Medicine | Admitting: Emergency Medicine

## 2020-08-21 DIAGNOSIS — R109 Unspecified abdominal pain: Secondary | ICD-10-CM | POA: Diagnosis present

## 2020-08-21 DIAGNOSIS — R1011 Right upper quadrant pain: Secondary | ICD-10-CM | POA: Diagnosis not present

## 2020-08-21 LAB — CBC WITH DIFFERENTIAL/PLATELET
Abs Immature Granulocytes: 0.02 10*3/uL (ref 0.00–0.07)
Basophils Absolute: 0.1 10*3/uL (ref 0.0–0.1)
Basophils Relative: 1 %
Eosinophils Absolute: 0.1 10*3/uL (ref 0.0–0.5)
Eosinophils Relative: 1 %
HCT: 40.9 % (ref 36.0–46.0)
Hemoglobin: 13.2 g/dL (ref 12.0–15.0)
Immature Granulocytes: 0 %
Lymphocytes Relative: 22 %
Lymphs Abs: 2 10*3/uL (ref 0.7–4.0)
MCH: 29.4 pg (ref 26.0–34.0)
MCHC: 32.3 g/dL (ref 30.0–36.0)
MCV: 91.1 fL (ref 80.0–100.0)
Monocytes Absolute: 0.7 10*3/uL (ref 0.1–1.0)
Monocytes Relative: 7 %
Neutro Abs: 6.3 10*3/uL (ref 1.7–7.7)
Neutrophils Relative %: 69 %
Platelets: 203 10*3/uL (ref 150–400)
RBC: 4.49 MIL/uL (ref 3.87–5.11)
RDW: 12.1 % (ref 11.5–15.5)
WBC: 9.2 10*3/uL (ref 4.0–10.5)
nRBC: 0 % (ref 0.0–0.2)

## 2020-08-21 LAB — COMPREHENSIVE METABOLIC PANEL
ALT: 19 U/L (ref 0–44)
AST: 26 U/L (ref 15–41)
Albumin: 4.6 g/dL (ref 3.5–5.0)
Alkaline Phosphatase: 41 U/L (ref 38–126)
Anion gap: 7 (ref 5–15)
BUN: 15 mg/dL (ref 8–23)
CO2: 30 mmol/L (ref 22–32)
Calcium: 9.4 mg/dL (ref 8.9–10.3)
Chloride: 103 mmol/L (ref 98–111)
Creatinine, Ser: 0.77 mg/dL (ref 0.44–1.00)
GFR, Estimated: >60 mL/min (ref >60)
Glucose, Bld: 81 mg/dL (ref 70–99)
Potassium: 3.9 mmol/L (ref 3.5–5.1)
Sodium: 140 mmol/L (ref 135–145)
Total Bilirubin: 0.9 mg/dL (ref 0.3–1.2)
Total Protein: 7.8 g/dL (ref 6.5–8.1)

## 2020-08-21 LAB — URINALYSIS, ROUTINE W REFLEX MICROSCOPIC
Bacteria, UA: NONE SEEN
Bilirubin Urine: NEGATIVE
Glucose, UA: NEGATIVE mg/dL
Ketones, ur: NEGATIVE mg/dL
Leukocytes,Ua: NEGATIVE
Nitrite: NEGATIVE
Protein, ur: NEGATIVE mg/dL
Specific Gravity, Urine: 1.003 — ABNORMAL LOW (ref 1.005–1.030)
pH: 8 (ref 5.0–8.0)

## 2020-08-21 LAB — LIPASE, BLOOD: Lipase: 34 U/L (ref 11–51)

## 2020-08-21 NOTE — ED Provider Notes (Addendum)
[facility] DEPT Provider Note   CSN: [id number] Arrival date & time: 08/21/20  1745     History Chief Complaint  Patient presents with  . Abdominal Pain    Robin Sellers is a 70 y.o. female.  HPI    70 year old female comes in a chief complaint of abdominal pain Patient reports that she started having severe abdominal pain last night around 2 AM.  She had some discomfort at 1030 before she went to bed.  The pain was severe, right-sided and there was no radiation of it.  The pain is since calmed down and now is a constant dull pain.  She denies any direct association with p.o. intake.  There is no associated nausea, vomiting, fevers, chills, UTI-like symptoms and there is no history of kidney stone or similar pain in the past. Of note, patient reports that she had noted that her pain was worse with deep inspiration or with cough.  Pt has no hx of PE, DVT and denies any exogenous hormone (testosterone / estrogen) use, long distance travels or surgery in the past 6 weeks, active cancer, recent immobilization.   No past medical history on file.  There are no problems to display for this patient.   Past Surgical History:  Procedure Laterality Date  . COLONOSCOPY WITH PROPOFOL N/A 04/01/2018   Procedure: COLONOSCOPY WITH PROPOFOL;  Surgeon: Otis Brace, MD;  Location: [facility];  Service: Gastroenterology;  Laterality: N/A;  . POLYPECTOMY  04/01/2018   Procedure: POLYPECTOMY;  Surgeon: Otis Brace, MD;  Location: [facility];  Service: Gastroenterology;;  . SUBMUCOSAL INJECTION  04/01/2018   Procedure: SUBMUCOSAL INJECTION;  Surgeon: Otis Brace, MD;  Location: [facility];  Service: Gastroenterology;;     OB History   No obstetric history on file.     Family History  Problem Relation Age of Onset  . Breast cancer Neg Hx        Home Medications Prior to Admission medications   Medication Sig Start Date End  Date Taking? Authorizing Provider  BIOTIN PO Take 1 tablet by mouth daily.   Yes [provider]  Calcium Carb-Cholecalciferol (CALCIUM+D3 PO) Take 1 tablet by mouth daily.   Yes [provider]  Cholecalciferol 50 MCG (2000 UT) CAPS Take 2,000 Units by mouth daily.   Yes [provider]  Multiple Vitamin (MULTIVITAMIN WITH MINERALS) TABS tablet Take 1 tablet by mouth daily. WITH BIOTIN   Yes [provider]    Allergies    Philippa Sicks scolymus (artichoke)]  Review of Systems   Review of Systems  Constitutional: Positive for activity change.  Respiratory: Negative for shortness of breath.   Cardiovascular: Negative for chest pain.  Gastrointestinal: Positive for abdominal pain.  Allergic/Immunologic: Negative for immunocompromised state.  Hematological: Does not bruise/bleed easily.  All other systems reviewed and are negative.   Physical Exam Updated Vital Signs BP (!) 145/78   Pulse 80   Temp 98.2 F (36.8 C) (Oral)   Resp 18   Ht 5\' 4"  (1.626 m)   Wt 50.8 kg   SpO2 98%   BMI 19.22 kg/m   Physical Exam Vitals and nursing note reviewed.  Constitutional:      Appearance: She is well-developed.  HENT:     Head: Normocephalic and atraumatic.  Cardiovascular:     Rate and Rhythm: Normal rate.  Pulmonary:     Effort: Pulmonary effort is normal.  Abdominal:     General: Bowel sounds are normal.  Tenderness: There is abdominal tenderness in the right upper quadrant. There is no guarding or rebound. Negative signs include Murphy's sign.  Musculoskeletal:     Cervical back: Normal range of motion and neck supple.  Skin:    General: Skin is warm and dry.  Neurological:     Mental Status: She is alert and oriented to person, place, and time.     ED Results / Procedures / Treatments   Labs (all labs ordered are listed, but only abnormal results are displayed) Labs Reviewed  URINALYSIS, ROUTINE W REFLEX MICROSCOPIC -  Abnormal; Notable for the following components:      Result Value   Color, Urine STRAW (*)    Specific Gravity, Urine 1.003 (*)    Hgb urine dipstick SMALL (*)    All other components within normal limits  CBC WITH DIFFERENTIAL/PLATELET  COMPREHENSIVE METABOLIC PANEL  LIPASE, BLOOD    EKG None  Radiology DG Chest 2 View  Result Date: 08/21/2020 CLINICAL DATA:  Chest pain EXAM: CHEST - 2 VIEW COMPARISON:  None. FINDINGS: The heart size and mediastinal contours are within normal limits. Both lungs are clear. The visualized skeletal structures are unremarkable. IMPRESSION: No active cardiopulmonary disease. Electronically Signed   By: Constance Holster M.D.   On: 08/21/2020 22:59   US Abdomen Limited RUQ (LIVER/GB)  Result Date: 08/21/2020 CLINICAL DATA:  Initial evaluation for acute right upper quadrant pain. EXAM: ULTRASOUND ABDOMEN LIMITED RIGHT UPPER QUADRANT COMPARISON:  None. FINDINGS: Gallbladder: No gallstones or wall thickening visualized. No sonographic Murphy sign noted by sonographer. Common bile duct: Diameter: 4.3 mm Liver: No focal lesion identified. Within normal limits in parenchymal echogenicity. Portal vein is patent on color Doppler imaging with normal direction of blood flow towards the liver. Other: None. IMPRESSION: Normal right upper quadrant ultrasound. Electronically Signed   By: Jeannine Boga M.D.   On: 08/21/2020 20:46    Procedures Procedures   Medications Ordered in ED Medications - No data to display  ED Course  I have reviewed the triage vital signs and the nursing notes.  Pertinent labs & imaging results that were available during my care of the patient were reviewed by me and considered in my medical decision making (see chart for details).    MDM Rules/Calculators/A&P                          70 year old comes in w/ chief complaint of abdominal pain. Initial concern was for hepatobiliary process.  Ultrasound and labs however are  completely reassuring.  No evidence of gallstone, sludge.  LFTs are normal, lipase is normal.  X-ray ordered because of this nonspecific pleuritic component and it is not revealing any evidence of pleural effusion, infiltrate.  Plan is to manage the patient conservatively for now.  She will see her PCP.  We have recommended taking antacids to see if that helps with the pain.  If her pain gets worse or she starts developing other symptoms, she will return to the ER and we will have to consider advanced imaging including CT scan.   Final Clinical Impression(s) / ED Diagnoses Final diagnoses:  RUQ pain    Rx / DC Orders ED Discharge Orders    None       Varney Biles, MD 08/21/20 2319

## 2020-08-21 NOTE — Discharge Instructions (Addendum)
Please come to the ER if you start having worsening abdominal pain, fevers, severe nausea and vomiting.  It might be a good idea to take antacids like Tums the next few days and see your primary care doctor in 1 week. Please try to keep a log of the pain -quality, pattern, timing, association to specific events like food intake -that might further help him with the diagnosis.  Take over-the-counter pain medicine for pain control for now.

## 2020-08-21 NOTE — ED Triage Notes (Signed)
Emergency Medicine Provider Triage Evaluation Note  Robin Sellers , a 70 y.o. female  was evaluated in triage.  Pt complains of RUQ pain that started last night. She notes she was having right-sided pain 2 weeks ago which resolved; however began having RUQ pain last night. No relationship to meals. No fever, chills, nausea, and vomiting. No overlying rash. She had a previous appendectomy, but no other operations.    Review of Systems  Positive: Abdominal pain Negative: Nausea, vomiting  Physical Exam  BP 125/68 (BP Location: Left Arm)   Pulse 74   Temp 98.2 F (36.8 C) (Oral)   Resp 18   Ht 5\' 4"  (1.626 m)   Wt 50.8 kg   SpO2 95%   BMI 19.22 kg/m  Gen:   Awake, no distress   HEENT:  Atraumatic  Resp:  Normal effort  Cardiac:  Normal rate  Abd:   Nondistended, RUQ TTP MSK:   Moves extremities without difficulty  Neuro:  Speech clear   Medical Decision Making  Medically screening exam initiated at 6:46 PM.  Appropriate orders placed.  Hamdi Vari was informed that the remainder of the evaluation will be completed by another provider, this initial triage assessment does not replace that evaluation, and the importance of remaining in the ED until their evaluation is complete.  Clinical Impression  RUQ pain. Possible gallbladder etiology. Labs ordered. RUQ [place].    Robin Sellers, [place] 08/21/20 1849

## 2020-08-21 NOTE — ED Triage Notes (Signed)
Patient reports sudden onset RUQ pain starting last night. Pain is still present and dull today. Patient has galbladder. Pain rated 5/10

## 2020-10-04 ENCOUNTER — Other Ambulatory Visit: Payer: Self-pay | Admitting: Internal Medicine

## 2020-10-04 DIAGNOSIS — R1011 Right upper quadrant pain: Secondary | ICD-10-CM

## 2020-10-26 ENCOUNTER — Ambulatory Visit
Admission: RE | Admit: 2020-10-26 | Discharge: 2020-10-26 | Disposition: A | Discharge status: Home / Self Care | Payer: Medicare Other | Source: Ambulatory Visit | Attending: Internal Medicine | Admitting: Internal Medicine

## 2020-10-26 DIAGNOSIS — R1011 Right upper quadrant pain: Secondary | ICD-10-CM

## 2020-10-27 ENCOUNTER — Ambulatory Visit
Admission: RE | Admit: 2020-10-27 | Discharge: 2020-10-27 | Disposition: A | Discharge status: Home / Self Care | Payer: Medicare Other | Source: Ambulatory Visit | Attending: Internal Medicine | Admitting: Internal Medicine

## 2020-10-27 ENCOUNTER — Other Ambulatory Visit: Payer: Self-pay | Admitting: Internal Medicine

## 2020-10-27 DIAGNOSIS — R1011 Right upper quadrant pain: Secondary | ICD-10-CM

## 2020-10-27 MED ORDER — IOPAMIDOL (ISOVUE-300) INJECTION 61%
100.0000 mL | Freq: Once | INTRAVENOUS | Status: AC | PRN
  Administered 2020-10-27: 100 mL via INTRAVENOUS

## 2020-11-01 ENCOUNTER — Encounter: Payer: Self-pay | Admitting: Gynecologic Oncology

## 2020-11-02 ENCOUNTER — Other Ambulatory Visit: Payer: Self-pay

## 2020-11-02 ENCOUNTER — Encounter: Payer: Self-pay | Admitting: Gynecologic Oncology

## 2020-11-02 ENCOUNTER — Inpatient Hospital Stay: Payer: Medicare Other | Attending: Gynecologic Oncology | Admitting: Gynecologic Oncology

## 2020-11-02 ENCOUNTER — Inpatient Hospital Stay: Payer: Medicare Other

## 2020-11-02 VITALS — BP 136/66 | HR 85 | Temp 97.3°F | Resp 16 | Ht 64.0 in | Wt 113.4 lb

## 2020-11-02 DIAGNOSIS — R1011 Right upper quadrant pain: Secondary | ICD-10-CM | POA: Diagnosis not present

## 2020-11-02 DIAGNOSIS — R600 Localized edema: Secondary | ICD-10-CM | POA: Diagnosis not present

## 2020-11-02 DIAGNOSIS — C786 Secondary malignant neoplasm of retroperitoneum and peritoneum: Secondary | ICD-10-CM | POA: Insufficient documentation

## 2020-11-02 DIAGNOSIS — C579 Malignant neoplasm of female genital organ, unspecified: Secondary | ICD-10-CM | POA: Insufficient documentation

## 2020-11-02 DIAGNOSIS — C800 Disseminated malignant neoplasm, unspecified: Secondary | ICD-10-CM

## 2020-11-02 DIAGNOSIS — Z801 Family history of malignant neoplasm of trachea, bronchus and lung: Secondary | ICD-10-CM | POA: Insufficient documentation

## 2020-11-02 DIAGNOSIS — C577 Malignant neoplasm of other specified female genital organs: Secondary | ICD-10-CM

## 2020-11-02 DIAGNOSIS — K5730 Diverticulosis of large intestine without perforation or abscess without bleeding: Secondary | ICD-10-CM | POA: Insufficient documentation

## 2020-11-02 DIAGNOSIS — C569 Malignant neoplasm of unspecified ovary: Secondary | ICD-10-CM

## 2020-11-02 DIAGNOSIS — K668 Other specified disorders of peritoneum: Secondary | ICD-10-CM

## 2020-11-02 DIAGNOSIS — R971 Elevated cancer antigen 125 [CA 125]: Secondary | ICD-10-CM | POA: Diagnosis not present

## 2020-11-02 DIAGNOSIS — Z8543 Personal history of malignant neoplasm of ovary: Secondary | ICD-10-CM | POA: Diagnosis not present

## 2020-11-02 DIAGNOSIS — M810 Age-related osteoporosis without current pathological fracture: Secondary | ICD-10-CM | POA: Insufficient documentation

## 2020-11-02 DIAGNOSIS — Z1211 Encounter for screening for malignant neoplasm of colon: Secondary | ICD-10-CM | POA: Insufficient documentation

## 2020-11-02 DIAGNOSIS — M818 Other osteoporosis without current pathological fracture: Secondary | ICD-10-CM

## 2020-11-02 DIAGNOSIS — Z803 Family history of malignant neoplasm of breast: Secondary | ICD-10-CM | POA: Insufficient documentation

## 2020-11-02 DIAGNOSIS — D398 Neoplasm of uncertain behavior of other specified female genital organs: Secondary | ICD-10-CM

## 2020-11-02 DIAGNOSIS — C482 Malignant neoplasm of peritoneum, unspecified: Secondary | ICD-10-CM | POA: Insufficient documentation

## 2020-11-02 LAB — CEA (IN HOUSE-CHCC): CEA (CHCC-In House): 1.07 ng/mL (ref 0.00–5.00)

## 2020-11-02 MED ORDER — SENNOSIDES-DOCUSATE SODIUM 8.6-50 MG PO TABS: 2.0000 | ORAL_TABLET | Freq: Every day | ORAL | 0 refills | Status: DC

## 2020-11-02 MED ORDER — IBUPROFEN 800 MG PO TABS: 800.0000 mg | ORAL_TABLET | Freq: Three times a day (TID) | ORAL | 0 refills | Status: DC | PRN

## 2020-11-02 MED ORDER — TRAMADOL HCL 50 MG PO TABS: 50.0000 mg | ORAL_TABLET | Freq: Four times a day (QID) | ORAL | 0 refills | Status: DC | PRN

## 2020-11-02 NOTE — Progress Notes (Signed)
GYNECOLOGIC ONCOLOGY NEW PATIENT CONSULTATION   Patient Name: Robin Sellers  Patient Age: 70 y.o. 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  I reviewed in detail the patient's findings on CT scan.  Prior to her visit, I spoke with one of the interventional radiologist given my concern that there may not be any tissue to biopsy.  He agreed that there was not a significant area of either omental or peritoneal disease that would be a good target for percutaneous biopsy.  We discussed the limitations of CT imaging to assess pelvic organs.  I have ordered a pelvic ultrasound to better assess the adnexa.  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  We will plan to get tumor markers including a CA-125 and CEA today.  In the setting of ovarian, fallopian tube, or primary peritoneal carcinoma, I discussed the typical treatment strategy. I discussed that the treatment approach for this disease is typically combination of cytoreductive surgery and chemotherapy. The sequencing can be either with upfront debulking followed by adjuvant chemotherapy sequentially or neoadjuvant chemotherapy followed by an interval cytoreductive attempt, then additional chemotherapy. This latter approach is associated with a reduced perioperative morbidity at the time of surgery.  I discussed that decisions regarding sequencing of therapy is individualized taking into account individual patient health, in addition to the apparent tumor distribution on imaging, and likelihood of complete surgical resection at the time of surgery. I discussed  that the overall survival observed in patients is equivalent for both approaches (neoadjuvant chemotherapy versus primary debulking surgery) provided that there is an optimal cytoreductive effort at the time of surgery (regardless of the timing of that surgery).   Given the need for tissue to establish a diagnosis, I am recommending that we proceed with diagnostic laparoscopy and possible biopsy.  I am concerned optimal cytoreduction may not be possible given CT findings.  If at the time of her diagnostic laparoscopy, complete cytoreduction does seem possible, then I would proceed with tumor debulking (in the event this appears to be a GYN malignancy).  If optimal cytoreduction was not deemed possible, then I would obtain tissue solely for diagnostic purposes with plan for a neoadjuvant approach.  We discussed the plan for a diagnostic laparoscopy, possible biopsy, possible conversion to laparotomy with tumor debulking including total hysterectomy, bilateral salpingo-oophorectomy, and any other indicated procedures. The risks of surgery were discussed in detail and she understands these to include infection; wound separation; hernia; vaginal cuff separation, injury to adjacent organs such as bowel, bladder, blood vessels, ureters and nerves; bleeding which may require blood transfusion; anesthesia risk; thromboembolic events; possible death; unforeseen complications; possible need for re-exploration; medical complications such as heart attack, stroke, pleural effusion and pneumonia; and, if full lymphadenectomy is performed the risk of lymphedema and lymphocyst. The patient will receive DVT and antibiotic prophylaxis as indicated. She voiced a clear understanding. She had the opportunity to ask questions. Perioperative instructions were reviewed with her. Prescriptions for post-op medications were sent to her pharmacy of choice.  The patient was counseled that if she undergoes laparotomy and tumor debulking, the  plan will be for hospital admission and discharged with 4 weeks of prophylactic Lovenox.  A  copy of this note was sent to the patient's referring provider.   90 minutes of total time was spent for this patient encounter, including preparation, face-to-face counseling with the patient and coordination of care, and documentation of the encounter.  Jeral Pinch, MD  Division of Gynecologic Oncology  Department of Obstetrics and Gynecology  [facility]  ___________________________________________  Chief Complaint: Chief Complaint  Patient presents with   Abdominal pain, right upper quadrant   Omental mass    History of Present Illness:  Robin Sellers is a 70 y.o. y.o. female who is seen in consultation at the request of Dr. Mancel Bale for an evaluation of imaging findings concerning for carcinomatosis.  The patient reports intermittent right upper quadrant pain that feel like she pulled a muscle beginning in March 2022.  She would have this pain only if she reached, pulled, or was active in some way.  Occasionally, she would have pain if she laid on her right side.  She denied any radiation of the pain or associated side effects such as nausea or emesis.  In April, she woke up with sharp pain in her right upper quadrant.  This was her first episode of sharp pain.  She thought that her pain was related to her gallbladder and presented to the emergency department.  She was seen in ED on 4/30. RUQ ultrasound and x-ray were normal as were LFTs and lipase.   Endorses a good appetite although somewhat decreased since finding out her CT results.  She denies any early satiety or bloating.  She has occasional "hardness" in her mid upper abdomen which she attributes to eating too much.  She reports normal bowel function.  She denies any urinary symptoms.  She is very healthy without significant medical diagnoses.  Her surgical history is notable for an appendectomy in  2008 performed laparoscopically for appendicitis.  She had an ectopic pregnancy in 1979 and had her right fallopian tube removed and her left tube tied.  Since that surgery, she has had occasional burning or cramping in her right lower quadrant.  She also had 2 D&Cs for miscarriages and a D&C for what sounds like either fibroids or polyps during her reproductive years when she began having mid cycle bleeding.  Patient's family history is notable for mother with lung cancer related to tobacco use and father with skin cancer.  Her daughter was diagnosed with breast cancer at age 64.  Daughter is now 18.  She had negative genetic testing.  There is no known family history of prostate, colon, or gynecologic cancer.  The patient lives in [place] alone.  She comes in with her husband, from who she she is legally separated, today.  They remain close friends.  She denies any tobacco use, reports occasional social alcohol use.  PAST MEDICAL HISTORY:  Past Medical History:  Diagnosis Date   Osteoporosis    Polyp of colon, unspecified part of colon, unspecified type    Benign     PAST SURGICAL HISTORY:  Past Surgical History:  Procedure Laterality Date   APPENDECTOMY  02/27/2007   [facility]   BILATERAL SALPINGECTOMY Right 03/1978   right ectopic, tied other tube   CATARACT EXTRACTION Right 10/31/2011   Dr. Katy Fitch   COLONOSCOPY  03/2019   Dr. Alessandra Bevels   COLONOSCOPY WITH PROPOFOL N/A 04/01/2018   Procedure: COLONOSCOPY WITH PROPOFOL;  Surgeon: Otis Brace, MD;  Location: [facility];  Service: Gastroenterology;  Laterality: N/A;   DILATION AND CURETTAGE  OF UTERUS     x3, 2 for miscarriages, one for polyp vs fibroid   POLYPECTOMY  04/01/2018   Procedure: POLYPECTOMY;  Surgeon: Otis Brace, MD;  Location: [facility];  Service: Gastroenterology;;   RETINAL TEAR REPAIR CRYOTHERAPY Right 04/25/2011   Dr. Dominic Pea INJECTION  04/01/2018   Procedure: SUBMUCOSAL INJECTION;   Surgeon: Otis Brace, MD;  Location: [facility];  Service: Gastroenterology;;   TUBAL LIGATION Left 03/1978    OB/GYN HISTORY:  OB History  Gravida Para Term Preterm AB Living  4 2          SAB IAB Ectopic Multiple Live Births               # Outcome Date GA Lbr Len/2nd Weight Sex Delivery Anes PTL Lv  4 Gravida           3 Gravida           2 Para           1 Para             No LMP recorded. Patient is postmenopausal.  Age at menarche: 19 Age at menopause: 30 Hx of HRT: Denies current use, has a history of using a patch followed by screening for several years which was stopped in 2012 or 2013. Hx of STDs: Denies Last pap: 2018 History of abnormal pap smears: Denies  SCREENING STUDIES:  Last mammogram: 2022  Last colonoscopy: 2020 -this was performed at [facility] gastroenterology.  Findings at the time included 2 polyps, both biopsied from the ascending colon.  1 showed a small sessile serrated adenoma, the other 1 was benign colonic mucosa.  Plan was for repeat colonoscopy in 3 years. Last bone mineral density: 2021  MEDICATIONS: Outpatient Encounter Medications as of 11/02/2020  Medication Sig   Biotin 5000 MCG TABS Take 5,000 mcg by mouth daily.   Cholecalciferol 50 MCG (2000 UT) CAPS Take 2,000 Units by mouth daily.   ibuprofen (ADVIL) 800 MG tablet Take 1 tablet (800 mg total) by mouth every 8 (eight) hours as needed for moderate pain. For AFTER surgery only   Multiple Vitamin (MULTIVITAMIN WITH MINERALS) TABS tablet Take 1 tablet by mouth daily. WITH BIOTIN   risedronate (ACTONEL) 150 MG tablet Take 150 mg by mouth every 30 (thirty) days.   senna-docusate (SENOKOT-S) 8.6-50 MG tablet Take 2 tablets by mouth at bedtime. For AFTER surgery, do not take if having diarrhea   traMADol (ULTRAM) 50 MG tablet Take 1 tablet (50 mg total) by mouth every 6 (six) hours as needed for severe pain. For AFTER surgery only, do not take and drive   [DISCONTINUED] Calcium  Carb-Cholecalciferol (CALCIUM+D3 PO) Take 1 tablet by mouth daily. Calicum 600mg  and Vitamin D is 12mcg (Patient not taking: Reported on 11/02/2020)   No facility-administered encounter medications on file as of 11/02/2020.    ALLERGIES:  Allergies  Allergen Reactions   Artichoke [Cynara Scolymus (Artichoke)] Rash     FAMILY HISTORY:  Family History  Problem Relation Age of Onset   Lung cancer Mother    Skin cancer Father    Breast cancer Daughter    Ovarian cancer Neg Hx    Endometrial cancer Neg Hx    Prostate cancer Neg Hx    Pancreatic cancer Neg Hx    Colon cancer Neg Hx      SOCIAL HISTORY:  Social Connections: Not on file    REVIEW OF SYSTEMS:  Denies appetite changes,  fevers, chills, fatigue, unexplained weight changes. Denies hearing loss, neck lumps or masses, mouth sores, ringing in ears or voice changes. Denies cough or wheezing.  Denies shortness of breath. Denies chest pain or palpitations. Denies leg swelling. Denies abdominal distention, pain, blood in stools, constipation, diarrhea, nausea, vomiting, or early satiety. Denies pain with intercourse, dysuria, frequency, hematuria or incontinence. Denies hot flashes, pelvic pain, vaginal bleeding or vaginal discharge.   Denies joint pain, back pain or muscle pain/cramps. Denies itching, rash, or wounds. Denies dizziness, headaches, numbness or seizures. Denies swollen lymph nodes or glands, denies easy bruising or bleeding. Denies anxiety, depression, confusion, or decreased concentration.  Physical Exam:  Vital Signs for this encounter:  Blood pressure 136/66, pulse 85, temperature (!) 97.3 F (36.3 C), temperature source Tympanic, resp. rate 16, height 5\' 4"  (1.626 m), weight 113 lb 6.4 oz (51.4 kg), SpO2 100 %. Body mass index is 19.47 kg/m. General: Alert, oriented, no acute distress.  HEENT: Normocephalic, atraumatic. Sclera anicteric.  Chest: Clear to auscultation bilaterally. No wheezes, rhonchi, or  rales. Cardiovascular: Regular rate and rhythm, no murmurs, rubs, or gallops.  Abdomen: Normoactive bowel sounds. Soft, nondistended, nontender to palpation. No masses or hepatosplenomegaly appreciated. No palpable fluid wave.  Extremities: Grossly normal range of motion. Warm, well perfused. No edema bilaterally.  Skin: No rashes or lesions.  Lymphatics: No cervical, supraclavicular, or inguinal adenopathy.  GU:  Normal external female genitalia.   No lesions. No discharge or bleeding.             Bladder/urethra:  No lesions or masses, well supported bladder             Vagina: Mildly atrophic, no lesions or masses.             Cervix: Normal appearing, no lesions.             Uterus: Small, mobile, no parametrial involvement or nodularity.             Adnexa: No masses appreciated.  Rectal: Some nodularity felt along the posterior cul-de-sac.  LABORATORY AND RADIOLOGIC DATA:  Outside medical records were reviewed to synthesize the above history, along with the history and physical obtained during the visit.   Lab Results  Component Value Date   WBC 9.2 08/21/2020   HGB 13.2 08/21/2020   HCT 40.9 08/21/2020   PLT 203 08/21/2020   GLUCOSE 81 08/21/2020   ALT 19 08/21/2020   AST 26 08/21/2020   NA 140 08/21/2020   K 3.9 08/21/2020   CL 103 08/21/2020   CREATININE 0.77 08/21/2020   BUN 15 08/21/2020   CO2 30 08/21/2020    CT A/P on 7/6: FINDINGS: Lower Chest: No acute findings.   Hepatobiliary: No hepatic masses identified. Gallbladder is unremarkable. No evidence of biliary ductal dilatation.   Pancreas:  No mass or inflammatory changes.   Spleen: Within normal limits in size and appearance.   Adrenals/Urinary Tract: No masses identified. No evidence of ureteral calculi or hydronephrosis. Unremarkable unopacified urinary bladder.   Stomach/Bowel: Diverticulosis is seen mainly involving the sigmoid colon, however there is no evidence of diverticulitis. No evidence of  bowel obstruction.   Vascular/Lymphatic: No pathologically enlarged lymph nodes. No acute vascular findings.   Reproductive:  No mass or other significant abnormality.   Other: Soft tissue stranding and caking is seen within the omental fat in the mid and left abdomen. Peritoneal thickening is also seen in the right perihepatic space and pelvic cul-de-sac. These  findings are suspicious for peritoneal carcinomatosis. No evidence of ascites.   Musculoskeletal:  No suspicious bone lesions identified.   IMPRESSION: Findings highly suspicious for peritoneal carcinomatosis. No site of primary malignancy identified.   Colonic diverticulosis, without radiographic evidence of diverticulitis.

## 2020-11-02 NOTE — Patient Instructions (Addendum)
Plan to have an ultrasound prior to surgery. We will also be checking lab work today and will contact you with the results.  Preparing for your Surgery  Plan for surgery on November 11, 2020 with Dr. Jeral Pinch at [place] will be scheduled for a diagnostic laparoscopy (looking in the abdomen with a camera through an incision), possible biopsy, possible exploratory laparotomy with tumor debulking (including total abdominal hysterectomy, bilateral salpingo-oophorectomy (removal of the uterus/cervix/fallopian tubes/ovaries)) and other indicated procedures.   If a cancer is identified and you have the open surgery listed above, you will be sent home with once daily lovenox injections to prevent blood clots. You will need the injections for a total of 28 days after surgery.  Pre-operative Testing -You will receive a phone call from presurgical testing at [facility] to arrange for a pre-operative appointment and lab work.  -Bring your insurance card, copy of an advanced directive if applicable, medication list  -At that visit, you will be asked to sign a consent for a possible blood transfusion in case a transfusion becomes necessary during surgery.  The need for a blood transfusion is rare but having consent is a necessary part of your care.     -You should not be taking blood thinners or aspirin at least ten days prior to surgery unless instructed by your surgeon.  -Do not take supplements such as fish oil (omega 3), red yeast rice, turmeric before your surgery. You want to avoid medications with aspirin in them including headache powders such as BC or Goody's), Excedrin migraine.  Day Before Surgery at [place] will be asked to take in a light diet the day before surgery. You will be advised you can have clear liquids up until 3 hours before your surgery.    Eat a light diet the day before surgery.  Examples including soups, broths, toast, yogurt, mashed potatoes.   AVOID GAS PRODUCING FOODS. Things to avoid include carbonated beverages (fizzy beverages, sodas), raw fruits and raw vegetables (uncooked), or beans.   If your bowels are filled with gas, your surgeon will have difficulty visualizing your pelvic organs which increases your surgical risks.  Your role in recovery Your role is to become active as soon as directed by your doctor, while still giving yourself time to heal.  Rest when you feel tired. You will be asked to do the following in order to speed your recovery:  - Cough and breathe deeply. This helps to clear and expand your lungs and can prevent pneumonia after surgery.  - [place]. Do mild physical activity. Walking or moving your legs help your circulation and body functions return to normal. Do not try to get up or walk alone the first time after surgery.   -If you develop swelling on one leg or the other, pain in the back of your leg, redness/warmth in one of your legs, please call the office or go to the Emergency Room to have a doppler to rule out a blood clot. For shortness of breath, chest pain-seek care in the Emergency Room as soon as possible. - Actively manage your pain. Managing your pain lets you move in comfort. We will ask you to rate your pain on a scale of zero to 10. It is your responsibility to tell your doctor or nurse where and how much you hurt so your pain can be treated.  Special Considerations -If you are diabetic, you may be placed  on insulin after surgery to have closer control over your blood sugars to promote healing and recovery.  This does not mean that you will be discharged on insulin.  If applicable, your oral antidiabetics will be resumed when you are tolerating a solid diet.  -Your final pathology results from surgery should be available around one week after surgery and the results will be relayed to you when available.  -Dr. Lahoma Crocker is the surgeon that assists your GYN  Oncologist with surgery.  If you end up staying the night, the next day after your surgery you will either see Dr. Denman George, Dr. Berline Lopes, or Dr. Lahoma Crocker.  -FMLA forms can be faxed to [phone] and please allow 5-7 business days for completion.  Pain Management After Surgery -You have been prescribed your pain medication and bowel regimen medications before surgery so that you can have these available when you are discharged from the hospital. The pain medication is for use ONLY AFTER surgery and a new prescription will not be given.   -Make sure that you have Tylenol and Ibuprofen at home to use on a regular basis after surgery for pain control. We recommend alternating the medications every hour to six hours since they work differently and are processed in the body differently for pain relief.  -Review the attached handout on narcotic use and their risks and side effects.   Bowel Regimen -You have been prescribed Sennakot-S to take nightly to prevent constipation especially if you are taking the narcotic pain medication intermittently.  It is important to prevent constipation and drink adequate amounts of liquids. You can stop taking this medication when you are not taking pain medication and you are back on your normal bowel routine.  Risks of Surgery Risks of surgery are low but include bleeding, infection, damage to surrounding structures, re-operation, blood clots, and very rarely death.   Blood Transfusion Information (For the consent to be signed before surgery)  We will be checking your blood type before surgery so in case of emergencies, we will know what type of blood you would need.                                            WHAT IS A BLOOD TRANSFUSION?  A transfusion is the replacement of blood or some of its parts. Blood is made up of multiple cells which provide different functions. Red blood cells carry oxygen and are used for blood loss replacement. White blood  cells fight against infection. Platelets control bleeding. Plasma helps clot blood. Other blood products are available for specialized needs, such as hemophilia or other clotting disorders. BEFORE THE TRANSFUSION  Who gives blood for transfusions?  You may be able to donate blood to be used at a later date on yourself (autologous donation). Relatives can be asked to donate blood. This is generally not any safer than if you have received blood from a stranger. The same precautions are taken to ensure safety when a relative's blood is donated. Healthy volunteers who are fully evaluated to make sure their blood is safe. This is blood bank blood. Transfusion therapy is the safest it has ever been in the practice of medicine. Before blood is taken from a donor, a complete history is taken to make sure that person has no history of diseases nor engages in risky social behavior (examples are intravenous drug  use or sexual activity with multiple partners). The donor's travel history is screened to minimize risk of transmitting infections, such as malaria. The donated blood is tested for signs of infectious diseases, such as HIV and hepatitis. The blood is then tested to be sure it is compatible with you in order to minimize the chance of a transfusion reaction. If you or a relative donates blood, this is often done in anticipation of surgery and is not appropriate for emergency situations. It takes many days to process the donated blood. RISKS AND COMPLICATIONS Although transfusion therapy is very safe and saves many lives, the main dangers of transfusion include:  Getting an infectious disease. Developing a transfusion reaction. This is an allergic reaction to something in the blood you were given. Every precaution is taken to prevent this. The decision to have a blood transfusion has been considered carefully by your caregiver before blood is given. Blood is not given unless the benefits outweigh the  risks.  AFTER SURGERY INSTRUCTIONS  Return to work: 4 weeks if applicable  (If you have surgery through the larger incision) You will have a white honeycomb dressing over your larger incision. This dressing can be removed 5 days after surgery and you do not need to reapply a new dressing. Once you remove the dressing, you will notice that you have the surgical glue (dermabond) on the incision and this will peel off on its own. You can get this dressing wet in the shower the days after surgery prior to removal on the 5th day.   Activity: 1. Be up and out of the bed during the day.  Take a nap if needed.  You may walk up steps but be careful and use the hand rail.  Stair climbing will tire you more than you think, you may need to stop part way and rest.   2. No lifting or straining for 6 weeks over 10 pounds. No pushing, pulling, straining for 6 weeks.  3. No driving for around 1 week(s).  Do not drive if you are taking narcotic pain medicine and make sure that your reaction time has returned.   4. You can shower as soon as the next day after surgery. Shower daily.  Use your regular soap and water (not directly on the incision) and pat your incision(s) dry afterwards; don't rub.  No tub baths or submerging your body in water until cleared by your surgeon. If you have the soap that was given to you by pre-surgical testing that was used before surgery, you do not need to use it afterwards because this can irritate your incisions.   5. No sexual activity and nothing in the vagina for 8 weeks (if you have a hysterectomy).  6. You may experience a small amount of clear drainage from your incisions, which is normal.  If the drainage persists, increases, or changes color please call the office.  7. Do not use creams, lotions, or ointments such as neosporin on your incisions after surgery until advised by your surgeon because they can cause removal of the dermabond glue on your incisions.    8. You may  experience vaginal spotting after surgery or around the 6-8 week mark from surgery when the stitches at the top of the vagina begin to dissolve.  The spotting is normal but if you experience heavy bleeding, call our office.  9. Take Tylenol or ibuprofen first for pain and only use narcotic pain medication for severe pain not relieved by the  Tylenol or Ibuprofen.  Monitor your Tylenol intake to a max of 4,000 mg in a 24 hour period. You can alternate these medications after surgery.  Diet: 1. Low sodium Heart Healthy Diet is recommended but you are cleared to resume your normal (before surgery) diet after your procedure.  2. It is safe to use a laxative, such as Miralax or Colace, if you have difficulty moving your bowels. You have been prescribed Sennakot at bedtime every evening to keep bowel movements regular and to prevent constipation.    Wound Care: 1. Keep clean and dry.  Shower daily.  Reasons to call the Doctor: Fever - Oral temperature greater than 100.4 degrees Fahrenheit Foul-smelling vaginal discharge Difficulty urinating Nausea and vomiting Increased pain at the site of the incision that is unrelieved with pain medicine. Difficulty breathing with or without chest pain New calf pain especially if only on one side Sudden, continuing increased vaginal bleeding with or without clots.   Contacts: For questions or concerns you should contact:  Dr. Jeral Pinch at [phone]  Joylene John, NP at [phone]  After Hours: call [phone] and have the GYN Oncologist paged/contacted (after 5 pm or on the weekends).  Messages sent via mychart are for non-urgent matters and are not responded to after hours so for urgent needs, please call the after hours number.

## 2020-11-02 NOTE — H&P (View-Only) (Signed)
GYNECOLOGIC ONCOLOGY NEW PATIENT CONSULTATION   Patient Name: Robin Sellers  Patient Age: 70 y.o. 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  I reviewed in detail the patient's findings on CT scan.  Prior to her visit, I spoke with one of the interventional radiologist given my concern that there may not be any tissue to biopsy.  He agreed that there was not a significant area of either omental or peritoneal disease that would be a good target for percutaneous biopsy.  We discussed the limitations of CT imaging to assess pelvic organs.  I have ordered a pelvic ultrasound to better assess the adnexa.  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  We will plan to get tumor markers including a CA-125 and CEA today.  In the setting of ovarian, fallopian tube, or primary peritoneal carcinoma, I discussed the typical treatment strategy. I discussed that the treatment approach for this disease is typically combination of cytoreductive surgery and chemotherapy. The sequencing can be either with upfront debulking followed by adjuvant chemotherapy sequentially or neoadjuvant chemotherapy followed by an interval cytoreductive attempt, then additional chemotherapy. This latter approach is associated with a reduced perioperative morbidity at the time of surgery.  I discussed that decisions regarding sequencing of therapy is individualized taking into account individual patient health, in addition to the apparent tumor distribution on imaging, and likelihood of complete surgical resection at the time of surgery. I discussed  that the overall survival observed in patients is equivalent for both approaches (neoadjuvant chemotherapy versus primary debulking surgery) provided that there is an optimal cytoreductive effort at the time of surgery (regardless of the timing of that surgery).   Given the need for tissue to establish a diagnosis, I am recommending that we proceed with diagnostic laparoscopy and possible biopsy.  I am concerned optimal cytoreduction may not be possible given CT findings.  If at the time of her diagnostic laparoscopy, complete cytoreduction does seem possible, then I would proceed with tumor debulking (in the event this appears to be a GYN malignancy).  If optimal cytoreduction was not deemed possible, then I would obtain tissue solely for diagnostic purposes with plan for a neoadjuvant approach.  We discussed the plan for a diagnostic laparoscopy, possible biopsy, possible conversion to laparotomy with tumor debulking including total hysterectomy, bilateral salpingo-oophorectomy, and any other indicated procedures. The risks of surgery were discussed in detail and she understands these to include infection; wound separation; hernia; vaginal cuff separation, injury to adjacent organs such as bowel, bladder, blood vessels, ureters and nerves; bleeding which may require blood transfusion; anesthesia risk; thromboembolic events; possible death; unforeseen complications; possible need for re-exploration; medical complications such as heart attack, stroke, pleural effusion and pneumonia; and, if full lymphadenectomy is performed the risk of lymphedema and lymphocyst. The patient will receive DVT and antibiotic prophylaxis as indicated. She voiced a clear understanding. She had the opportunity to ask questions. Perioperative instructions were reviewed with her. Prescriptions for post-op medications were sent to her pharmacy of choice.  The patient was counseled that if she undergoes laparotomy and tumor debulking, the  plan will be for hospital admission and discharged with 4 weeks of prophylactic Lovenox.  A  copy of this note was sent to the patient's referring provider.   90 minutes of total time was spent for this patient encounter, including preparation, face-to-face counseling with the patient and coordination of care, and documentation of the encounter.  Jeral Pinch, MD  Division of Gynecologic Oncology  Department of Obstetrics and Gynecology  [facility]  ___________________________________________  Chief Complaint: Chief Complaint  Patient presents with   Abdominal pain, right upper quadrant   Omental mass    History of Present Illness:  Robin Sellers is a 70 y.o. y.o. female who is seen in consultation at the request of Dr. Mancel Bale for an evaluation of imaging findings concerning for carcinomatosis.  The patient reports intermittent right upper quadrant pain that feel like she pulled a muscle beginning in March 2022.  She would have this pain only if she reached, pulled, or was active in some way.  Occasionally, she would have pain if she laid on her right side.  She denied any radiation of the pain or associated side effects such as nausea or emesis.  In April, she woke up with sharp pain in her right upper quadrant.  This was her first episode of sharp pain.  She thought that her pain was related to her gallbladder and presented to the emergency department.  She was seen in ED on 4/30. RUQ ultrasound and x-ray were normal as were LFTs and lipase.   Endorses a good appetite although somewhat decreased since finding out her CT results.  She denies any early satiety or bloating.  She has occasional "hardness" in her mid upper abdomen which she attributes to eating too much.  She reports normal bowel function.  She denies any urinary symptoms.  She is very healthy without significant medical diagnoses.  Her surgical history is notable for an appendectomy in  2008 performed laparoscopically for appendicitis.  She had an ectopic pregnancy in 1979 and had her right fallopian tube removed and her left tube tied.  Since that surgery, she has had occasional burning or cramping in her right lower quadrant.  She also had 2 D&Cs for miscarriages and a D&C for what sounds like either fibroids or polyps during her reproductive years when she began having mid cycle bleeding.  Patient's family history is notable for mother with lung cancer related to tobacco use and father with skin cancer.  Her daughter was diagnosed with breast cancer at age 62.  Daughter is now 11.  She had negative genetic testing.  There is no known family history of prostate, colon, or gynecologic cancer.  The patient lives in [place] alone.  She comes in with her husband, from who she she is legally separated, today.  They remain close friends.  She denies any tobacco use, reports occasional social alcohol use.  PAST MEDICAL HISTORY:  Past Medical History:  Diagnosis Date   Osteoporosis    Polyp of colon, unspecified part of colon, unspecified type    Benign     PAST SURGICAL HISTORY:  Past Surgical History:  Procedure Laterality Date   APPENDECTOMY  02/27/2007   [facility]sc   BILATERAL SALPINGECTOMY Right 03/1978   right ectopic, tied other tube   CATARACT EXTRACTION Right 10/31/2011   Dr. Katy Fitch   COLONOSCOPY  03/2019   Dr. Alessandra Bevels   COLONOSCOPY WITH PROPOFOL N/A 04/01/2018   Procedure: COLONOSCOPY WITH PROPOFOL;  Surgeon: Otis Brace, MD;  Location: [facility];  Service: Gastroenterology;  Laterality: N/A;   DILATION AND CURETTAGE  OF UTERUS     x3, 2 for miscarriages, one for polyp vs fibroid   POLYPECTOMY  04/01/2018   Procedure: POLYPECTOMY;  Surgeon: Otis Brace, MD;  Location: [facility];  Service: Gastroenterology;;   RETINAL TEAR REPAIR CRYOTHERAPY Right 04/25/2011   Dr. Dominic Pea INJECTION  04/01/2018   Procedure: SUBMUCOSAL INJECTION;   Surgeon: Otis Brace, MD;  Location: [facility];  Service: Gastroenterology;;   TUBAL LIGATION Left 03/1978    OB/GYN HISTORY:  OB History  Gravida Para Term Preterm AB Living  4 2          SAB IAB Ectopic Multiple Live Births               # Outcome Date GA Lbr Len/2nd Weight Sex Delivery Anes PTL Lv  4 Gravida           3 Gravida           2 Para           1 Para             No LMP recorded. Patient is postmenopausal.  Age at menarche: 27 Age at menopause: 42 Hx of HRT: Denies current use, has a history of using a patch followed by screening for several years which was stopped in 2012 or 2013. Hx of STDs: Denies Last pap: 2018 History of abnormal pap smears: Denies  SCREENING STUDIES:  Last mammogram: 2022  Last colonoscopy: 2020 -this was performed at [facility] gastroenterology.  Findings at the time included 2 polyps, both biopsied from the ascending colon.  1 showed a small sessile serrated adenoma, the other 1 was benign colonic mucosa.  Plan was for repeat colonoscopy in 3 years. Last bone mineral density: 2021  MEDICATIONS: Outpatient Encounter Medications as of 11/02/2020  Medication Sig   Biotin 5000 MCG TABS Take 5,000 mcg by mouth daily.   Cholecalciferol 50 MCG (2000 UT) CAPS Take 2,000 Units by mouth daily.   ibuprofen (ADVIL) 800 MG tablet Take 1 tablet (800 mg total) by mouth every 8 (eight) hours as needed for moderate pain. For AFTER surgery only   Multiple Vitamin (MULTIVITAMIN WITH MINERALS) TABS tablet Take 1 tablet by mouth daily. WITH BIOTIN   risedronate (ACTONEL) 150 MG tablet Take 150 mg by mouth every 30 (thirty) days.   senna-docusate (SENOKOT-S) 8.6-50 MG tablet Take 2 tablets by mouth at bedtime. For AFTER surgery, do not take if having diarrhea   traMADol (ULTRAM) 50 MG tablet Take 1 tablet (50 mg total) by mouth every 6 (six) hours as needed for severe pain. For AFTER surgery only, do not take and drive   [DISCONTINUED] Calcium  Carb-Cholecalciferol (CALCIUM+D3 PO) Take 1 tablet by mouth daily. Calicum 600mg  and Vitamin D is 79mcg (Patient not taking: Reported on 11/02/2020)   No facility-administered encounter medications on file as of 11/02/2020.    ALLERGIES:  Allergies  Allergen Reactions   Artichoke [Cynara Scolymus (Artichoke)] Rash     FAMILY HISTORY:  Family History  Problem Relation Age of Onset   Lung cancer Mother    Skin cancer Father    Breast cancer Daughter    Ovarian cancer Neg Hx    Endometrial cancer Neg Hx    Prostate cancer Neg Hx    Pancreatic cancer Neg Hx    Colon cancer Neg Hx      SOCIAL HISTORY:  Social Connections: Not on file    REVIEW OF SYSTEMS:  Denies appetite changes,  fevers, chills, fatigue, unexplained weight changes. Denies hearing loss, neck lumps or masses, mouth sores, ringing in ears or voice changes. Denies cough or wheezing.  Denies shortness of breath. Denies chest pain or palpitations. Denies leg swelling. Denies abdominal distention, pain, blood in stools, constipation, diarrhea, nausea, vomiting, or early satiety. Denies pain with intercourse, dysuria, frequency, hematuria or incontinence. Denies hot flashes, pelvic pain, vaginal bleeding or vaginal discharge.   Denies joint pain, back pain or muscle pain/cramps. Denies itching, rash, or wounds. Denies dizziness, headaches, numbness or seizures. Denies swollen lymph nodes or glands, denies easy bruising or bleeding. Denies anxiety, depression, confusion, or decreased concentration.  Physical Exam:  Vital Signs for this encounter:  Blood pressure 136/66, pulse 85, temperature (!) 97.3 F (36.3 C), temperature source Tympanic, resp. rate 16, height 5\' 4"  (1.626 m), weight 113 lb 6.4 oz (51.4 kg), SpO2 100 %. Body mass index is 19.47 kg/m. General: Alert, oriented, no acute distress.  HEENT: Normocephalic, atraumatic. Sclera anicteric.  Chest: Clear to auscultation bilaterally. No wheezes, rhonchi, or  rales. Cardiovascular: Regular rate and rhythm, no murmurs, rubs, or gallops.  Abdomen: Normoactive bowel sounds. Soft, nondistended, nontender to palpation. No masses or hepatosplenomegaly appreciated. No palpable fluid wave.  Extremities: Grossly normal range of motion. Warm, well perfused. No edema bilaterally.  Skin: No rashes or lesions.  Lymphatics: No cervical, supraclavicular, or inguinal adenopathy.  GU:  Normal external female genitalia.   No lesions. No discharge or bleeding.             Bladder/urethra:  No lesions or masses, well supported bladder             Vagina: Mildly atrophic, no lesions or masses.             Cervix: Normal appearing, no lesions.             Uterus: Small, mobile, no parametrial involvement or nodularity.             Adnexa: No masses appreciated.  Rectal: Some nodularity felt along the posterior cul-de-sac.  LABORATORY AND RADIOLOGIC DATA:  Outside medical records were reviewed to synthesize the above history, along with the history and physical obtained during the visit.   Lab Results  Component Value Date   WBC 9.2 08/21/2020   HGB 13.2 08/21/2020   HCT 40.9 08/21/2020   PLT 203 08/21/2020   GLUCOSE 81 08/21/2020   ALT 19 08/21/2020   AST 26 08/21/2020   NA 140 08/21/2020   K 3.9 08/21/2020   CL 103 08/21/2020   CREATININE 0.77 08/21/2020   BUN 15 08/21/2020   CO2 30 08/21/2020    CT A/P on 7/6: FINDINGS: Lower Chest: No acute findings.   Hepatobiliary: No hepatic masses identified. Gallbladder is unremarkable. No evidence of biliary ductal dilatation.   Pancreas:  No mass or inflammatory changes.   Spleen: Within normal limits in size and appearance.   Adrenals/Urinary Tract: No masses identified. No evidence of ureteral calculi or hydronephrosis. Unremarkable unopacified urinary bladder.   Stomach/Bowel: Diverticulosis is seen mainly involving the sigmoid colon, however there is no evidence of diverticulitis. No evidence of  bowel obstruction.   Vascular/Lymphatic: No pathologically enlarged lymph nodes. No acute vascular findings.   Reproductive:  No mass or other significant abnormality.   Other: Soft tissue stranding and caking is seen within the omental fat in the mid and left abdomen. Peritoneal thickening is also seen in the right perihepatic space and pelvic cul-de-sac. These  findings are suspicious for peritoneal carcinomatosis. No evidence of ascites.   Musculoskeletal:  No suspicious bone lesions identified.   IMPRESSION: Findings highly suspicious for peritoneal carcinomatosis. No site of primary malignancy identified.   Colonic diverticulosis, without radiographic evidence of diverticulitis.

## 2020-11-03 ENCOUNTER — Telehealth: Payer: Self-pay | Admitting: *Deleted

## 2020-11-03 LAB — CA 125: Cancer Antigen (CA) 125: 88.2 U/mL — ABNORMAL HIGH (ref 0.0–38.1)

## 2020-11-03 NOTE — Telephone Encounter (Signed)
Patient called and asked if it was ok to receive her second booster before surgery, per Melissa APP it is fine to receive the booster. Patient verbalized understanding

## 2020-11-05 NOTE — Patient Instructions (Addendum)
DUE TO COVID-19 ONLY ONE VISITOR IS ALLOWED TO COME WITH YOU AND STAY IN THE WAITING ROOM ONLY DURING PRE OP AND PROCEDURE DAY OF SURGERY. THE 1 VISITOR  MAY VISIT WITH YOU AFTER SURGERY IN YOUR PRIVATE ROOM DURING VISITING HOURS ONLY!  YOU NEED TO HAVE A COVID 19 TEST ON: 11/09/20 @  12:30 PM, THIS TEST MUST BE DONE BEFORE SURGERY,  COVID TESTING SITE [place] [address], IT IS ON THE RIGHT GOING OUT [address] APPROXIMATELY  2 MINUTES PAST [employer] ON THE RIGHT. ONCE YOUR COVID TEST IS COMPLETED,  PLEASE BEGIN THE QUARANTINE INSTRUCTIONS AS OUTLINED IN YOUR HANDOUT.               Robin Sellers   Your procedure is scheduled on:    Report to [facility] Main  Entrance   Report to admitting at : 12:00 PM     Call this number if you have problems the morning of surgery [phone]    Remember: Eat a light diet the day before surgery.  Examples including soups, broths, toast, yogurt, mashed potatoes.  Things to avoid include carbonated beverages (fizzy beverages), raw fruits and raw vegetables, or beans.   If your bowels are filled with gas, your surgeon will have difficulty visualizing your pelvic organs which increases your surgical risks. Do not eat solid food :After Midnight. Clear liquids until: 11:00 AM.  CLEAR LIQUID DIET  Foods Allowed                                                                     Foods Excluded  Coffee and tea, regular and decaf                             liquids that you cannot  Plain Jell-O any favor except red or purple                                           see through such as: Fruit ices (not with fruit pulp)                                     milk, soups, orange juice  Iced Popsicles                                    All solid food Carbonated beverages, regular and diet                                    Cranberry, grape and apple juices Sports drinks like Gatorade Lightly seasoned clear  broth or consume(fat free) Sugar, honey syrup  Sample Menu Breakfast  Lunch                                     Supper Cranberry juice                    Beef broth                            Chicken broth Jell-O                                     Grape juice                           Apple juice Coffee or tea                        Jell-O                                      Popsicle                                                Coffee or tea                        Coffee or tea  _____________________________________________________________________   BRUSH YOUR TEETH MORNING OF SURGERY AND RINSE YOUR MOUTH OUT, NO CHEWING GUM CANDY OR MINTS.    Take these medicines the morning of surgery with A SIP OF WATER: N/A                               You may not have any metal on your body including hair pins and              piercings  Do not wear jewelry, make-up, lotions, powders or perfumes, deodorant             Do not wear nail polish on your fingernails.  Do not shave  48 hours prior to surgery.    Do not bring valuables to the hospital. [facility].  Contacts, dentures or bridgework may not be worn into surgery.  Leave suitcase in the car. After surgery it may be brought to your room.     Patients discharged the day of surgery will not be allowed to drive home. IF YOU ARE HAVING SURGERY AND GOING HOME THE SAME DAY, YOU MUST HAVE AN ADULT TO DRIVE YOU HOME AND BE WITH YOU FOR 24 HOURS. YOU MAY GO HOME BY TAXI OR [name] OR ORTHERWISE, BUT AN ADULT MUST ACCOMPANY YOU HOME AND STAY WITH YOU FOR 24 HOURS.  Name and phone number of your driver:  Special Instructions: N/A              Please read over the following fact sheets you were given: _____________________________________________________________________  Robin Sellers - Preparing for Surgery Before surgery, you can play an important role.  Because  skin is not sterile, your skin needs to be as free of germs as possible.  You can reduce the number of germs on your skin by washing with CHG (chlorahexidine gluconate) soap before surgery.  CHG is an antiseptic cleaner which kills germs and bonds with the skin to continue killing germs even after washing. Please DO NOT use if you have an allergy to CHG or antibacterial soaps.  If your skin becomes reddened/irritated stop using the CHG and inform your nurse when you arrive at Short Stay. Do not shave (including legs and underarms) for at least 48 hours prior to the first CHG shower.  You may shave your face/neck. Please follow these instructions carefully:  1.  Shower with CHG Soap the night before surgery and the  morning of Surgery.  2.  If you choose to wash your hair, wash your hair first as usual with your  normal  shampoo.  3.  After you shampoo, rinse your hair and body thoroughly to remove the  shampoo.                           4.  Use CHG as you would any other liquid soap.  You can apply chg directly  to the skin and wash                       Gently with a scrungie or clean washcloth.  5.  Apply the CHG Soap to your body ONLY FROM THE NECK DOWN.   Do not use on face/ open                           Wound or open sores. Avoid contact with eyes, ears mouth and genitals (private parts).                       Wash face,  Genitals (private parts) with your normal soap.             6.  Wash thoroughly, paying special attention to the area where your surgery  will be performed.  7.  Thoroughly rinse your body with warm water from the neck down.  8.  DO NOT shower/wash with your normal soap after using and rinsing off  the CHG Soap.                9.  Pat yourself dry with a clean towel.            10.  Wear clean pajamas.            11.  Place clean sheets on your bed the night of your first shower and do not  sleep with pets. Day of Surgery : Do not apply any lotions/deodorants the morning of  surgery.  Please wear clean clothes to the hospital/surgery center.  FAILURE TO FOLLOW THESE INSTRUCTIONS MAY RESULT IN THE CANCELLATION OF YOUR SURGERY PATIENT SIGNATURE_________________________________  NURSE SIGNATURE__________________________________  ________________________________________________________________________

## 2020-11-08 ENCOUNTER — Encounter (HOSPITAL_COMMUNITY): Payer: Self-pay

## 2020-11-08 ENCOUNTER — Other Ambulatory Visit: Payer: Self-pay

## 2020-11-08 ENCOUNTER — Ambulatory Visit (HOSPITAL_COMMUNITY)
Admission: RE | Admit: 2020-11-08 | Discharge: 2020-11-08 | Disposition: A | Discharge status: Home / Self Care | Payer: Medicare Other | Source: Ambulatory Visit | Attending: Gynecologic Oncology | Admitting: Gynecologic Oncology

## 2020-11-08 ENCOUNTER — Encounter (HOSPITAL_COMMUNITY)
Admission: RE | Admit: 2020-11-08 | Discharge: 2020-11-08 | Disposition: A | Discharge status: Home / Self Care | Payer: Medicare Other | Source: Ambulatory Visit | Attending: Gynecologic Oncology | Admitting: Gynecologic Oncology

## 2020-11-08 DIAGNOSIS — K668 Other specified disorders of peritoneum: Secondary | ICD-10-CM | POA: Diagnosis present

## 2020-11-08 HISTORY — DX: Anemia, unspecified: D64.9

## 2020-11-08 HISTORY — DX: Malignant (primary) neoplasm, unspecified: C80.1

## 2020-11-08 LAB — COMPREHENSIVE METABOLIC PANEL
ALT: 16 U/L (ref 0–44)
AST: 22 U/L (ref 15–41)
Albumin: 4.6 g/dL (ref 3.5–5.0)
Alkaline Phosphatase: 40 U/L (ref 38–126)
Anion gap: 11 (ref 5–15)
BUN: 24 mg/dL — ABNORMAL HIGH (ref 8–23)
CO2: 23 mmol/L (ref 22–32)
Calcium: 9.6 mg/dL (ref 8.9–10.3)
Chloride: 105 mmol/L (ref 98–111)
Creatinine, Ser: 0.73 mg/dL (ref 0.44–1.00)
GFR, Estimated: >60 mL/min (ref >60)
Glucose, Bld: 110 mg/dL — ABNORMAL HIGH (ref 70–99)
Potassium: 4.2 mmol/L (ref 3.5–5.1)
Sodium: 139 mmol/L (ref 135–145)
Total Bilirubin: 0.6 mg/dL (ref 0.3–1.2)
Total Protein: 7.4 g/dL (ref 6.5–8.1)

## 2020-11-08 LAB — URINALYSIS, ROUTINE W REFLEX MICROSCOPIC
Bilirubin Urine: NEGATIVE
Glucose, UA: NEGATIVE mg/dL
Hgb urine dipstick: NEGATIVE
Ketones, ur: NEGATIVE mg/dL
Leukocytes,Ua: NEGATIVE
Nitrite: NEGATIVE
Protein, ur: NEGATIVE mg/dL
Specific Gravity, Urine: 1.015 (ref 1.005–1.030)
pH: 5 (ref 5.0–8.0)

## 2020-11-08 LAB — CBC
HCT: 39.3 % (ref 36.0–46.0)
Hemoglobin: 12.9 g/dL (ref 12.0–15.0)
MCH: 29.5 pg (ref 26.0–34.0)
MCHC: 32.8 g/dL (ref 30.0–36.0)
MCV: 89.9 fL (ref 80.0–100.0)
Platelets: 209 10*3/uL (ref 150–400)
RBC: 4.37 MIL/uL (ref 3.87–5.11)
RDW: 12.5 % (ref 11.5–15.5)
WBC: 8.7 10*3/uL (ref 4.0–10.5)
nRBC: 0 % (ref 0.0–0.2)

## 2020-11-08 NOTE — Progress Notes (Signed)
COVID Vaccine Completed:Yes Date COVID Vaccine completed: 10/2020 Second boaster COVID vaccine manufacturer: [employer]   PCP - Dr. Ollen Bowl Cardiologist -   Chest x-ray -  EKG -  Stress Test -  ECHO -  Cardiac Cath -  Pacemaker/ICD device last checked:  Sleep Study -  CPAP -   Fasting Blood Sugar -  Checks Blood Sugar _____ times a day  Blood Thinner Instructions: Aspirin Instructions: Last Dose:  Anesthesia review:   Patient denies shortness of breath, fever, cough and chest pain at PAT appointment   Patient verbalized understanding of instructions that were given to them at the PAT appointment. Patient was also instructed that they will need to review over the PAT instructions again at home before surgery.

## 2020-11-09 ENCOUNTER — Other Ambulatory Visit (HOSPITAL_COMMUNITY)
Admission: RE | Admit: 2020-11-09 | Discharge: 2020-11-09 | Disposition: A | Discharge status: Home / Self Care | Payer: Medicare Other | Source: Ambulatory Visit | Attending: Gynecologic Oncology | Admitting: Gynecologic Oncology

## 2020-11-09 DIAGNOSIS — Z20822 Contact with and (suspected) exposure to covid-19: Secondary | ICD-10-CM | POA: Diagnosis not present

## 2020-11-09 DIAGNOSIS — Z01812 Encounter for preprocedural laboratory examination: Secondary | ICD-10-CM | POA: Diagnosis present

## 2020-11-09 LAB — SARS CORONAVIRUS 2 (TAT 6-24 HRS): SARS Coronavirus 2: NEGATIVE

## 2020-11-10 ENCOUNTER — Telehealth: Payer: Self-pay

## 2020-11-10 NOTE — Telephone Encounter (Signed)
Telephone call to check on pre-operative status.  Patient compliant with pre-operative instructions.  Reinforced NPO after midnight.  No questions or concerns voiced.  Instructed to call for any needs.   

## 2020-11-11 ENCOUNTER — Ambulatory Visit (HOSPITAL_COMMUNITY)
Admission: RE | Admit: 2020-11-11 | Discharge: 2020-11-11 | Disposition: A | Discharge status: Home / Self Care | Payer: Medicare Other | Attending: Gynecologic Oncology | Admitting: Gynecologic Oncology

## 2020-11-11 ENCOUNTER — Inpatient Hospital Stay (HOSPITAL_COMMUNITY): Payer: Medicare Other | Admitting: Anesthesiology

## 2020-11-11 ENCOUNTER — Encounter (HOSPITAL_COMMUNITY): Payer: Self-pay | Admitting: Gynecologic Oncology

## 2020-11-11 ENCOUNTER — Encounter (HOSPITAL_COMMUNITY)
Admission: RE | Disposition: A | Discharge status: Home / Self Care | Payer: Self-pay | Source: Home / Self Care | Attending: Gynecologic Oncology

## 2020-11-11 DIAGNOSIS — Z808 Family history of malignant neoplasm of other organs or systems: Secondary | ICD-10-CM | POA: Diagnosis not present

## 2020-11-11 DIAGNOSIS — R971 Elevated cancer antigen 125 [CA 125]: Secondary | ICD-10-CM | POA: Insufficient documentation

## 2020-11-11 DIAGNOSIS — C801 Malignant (primary) neoplasm, unspecified: Secondary | ICD-10-CM

## 2020-11-11 DIAGNOSIS — Z79899 Other long term (current) drug therapy: Secondary | ICD-10-CM | POA: Diagnosis not present

## 2020-11-11 DIAGNOSIS — Z91018 Allergy to other foods: Secondary | ICD-10-CM | POA: Insufficient documentation

## 2020-11-11 DIAGNOSIS — Z801 Family history of malignant neoplasm of trachea, bronchus and lung: Secondary | ICD-10-CM | POA: Diagnosis not present

## 2020-11-11 DIAGNOSIS — Z803 Family history of malignant neoplasm of breast: Secondary | ICD-10-CM | POA: Diagnosis not present

## 2020-11-11 DIAGNOSIS — C786 Secondary malignant neoplasm of retroperitoneum and peritoneum: Secondary | ICD-10-CM

## 2020-11-11 DIAGNOSIS — C800 Disseminated malignant neoplasm, unspecified: Secondary | ICD-10-CM

## 2020-11-11 HISTORY — PX: LAPAROSCOPY: SHX197

## 2020-11-11 LAB — TYPE AND SCREEN
ABO/RH(D): A POS
Antibody Screen: NEGATIVE

## 2020-11-11 LAB — ABO/RH: ABO/RH(D): A POS

## 2020-11-11 SURGERY — LAPAROSCOPY, DIAGNOSTIC
Anesthesia: General | Site: Abdomen

## 2020-11-11 MED ORDER — ONDANSETRON HCL 4 MG/2ML IJ SOLN
INTRAMUSCULAR | Status: AC
  Filled 2020-11-11: qty 2

## 2020-11-11 MED ORDER — ROCURONIUM BROMIDE 10 MG/ML (PF) SYRINGE
PREFILLED_SYRINGE | INTRAVENOUS | Status: DC | PRN
  Administered 2020-11-11: 60 mg via INTRAVENOUS

## 2020-11-11 MED ORDER — ONDANSETRON HCL 4 MG/2ML IJ SOLN: 4.0000 mg | Freq: Once | INTRAMUSCULAR | Status: DC | PRN

## 2020-11-11 MED ORDER — FENTANYL CITRATE (PF) 100 MCG/2ML IJ SOLN
INTRAMUSCULAR | Status: AC
  Filled 2020-11-11: qty 2

## 2020-11-11 MED ORDER — MIDAZOLAM HCL 2 MG/2ML IJ SOLN
INTRAMUSCULAR | Status: AC
  Filled 2020-11-11: qty 2

## 2020-11-11 MED ORDER — EPHEDRINE 5 MG/ML INJ
INTRAVENOUS | Status: AC
  Filled 2020-11-11: qty 5

## 2020-11-11 MED ORDER — LIDOCAINE 2% (20 MG/ML) 5 ML SYRINGE
INTRAMUSCULAR | Status: DC | PRN
Start: 2020-11-11 — End: 2020-11-11
  Administered 2020-11-11: 60 mg via INTRAVENOUS

## 2020-11-11 MED ORDER — LACTATED RINGERS IR SOLN
Status: DC | PRN
  Administered 2020-11-11: 1000 mL

## 2020-11-11 MED ORDER — ACETAMINOPHEN 500 MG PO TABS
1000.0000 mg | ORAL_TABLET | ORAL | Status: AC
  Administered 2020-11-11: 1000 mg via ORAL
  Filled 2020-11-11: qty 2

## 2020-11-11 MED ORDER — FENTANYL CITRATE (PF) 100 MCG/2ML IJ SOLN
INTRAMUSCULAR | Status: DC | PRN
  Administered 2020-11-11: 100 ug via INTRAVENOUS

## 2020-11-11 MED ORDER — DEXAMETHASONE SODIUM PHOSPHATE 10 MG/ML IJ SOLN
INTRAMUSCULAR | Status: DC | PRN
  Administered 2020-11-11: 10 mg via INTRAVENOUS

## 2020-11-11 MED ORDER — HEPARIN SODIUM (PORCINE) 5000 UNIT/ML IJ SOLN
5000.0000 [IU] | INTRAMUSCULAR | Status: AC
  Administered 2020-11-11: 5000 [IU] via SUBCUTANEOUS
  Filled 2020-11-11: qty 1

## 2020-11-11 MED ORDER — BUPIVACAINE HCL (PF) 0.25 % IJ SOLN
INTRAMUSCULAR | Status: DC | PRN
  Administered 2020-11-11: 19 mL

## 2020-11-11 MED ORDER — FENTANYL CITRATE (PF) 250 MCG/5ML IJ SOLN
INTRAMUSCULAR | Status: AC
  Filled 2020-11-11: qty 5

## 2020-11-11 MED ORDER — SUGAMMADEX SODIUM 200 MG/2ML IV SOLN
INTRAVENOUS | Status: DC | PRN
  Administered 2020-11-11: 200 mg via INTRAVENOUS

## 2020-11-11 MED ORDER — PROPOFOL 10 MG/ML IV BOLUS
INTRAVENOUS | Status: DC | PRN
  Administered 2020-11-11: 150 mg via INTRAVENOUS

## 2020-11-11 MED ORDER — ONDANSETRON HCL 4 MG/2ML IJ SOLN
INTRAMUSCULAR | Status: DC | PRN
  Administered 2020-11-11: 4 mg via INTRAVENOUS

## 2020-11-11 MED ORDER — LACTATED RINGERS IV SOLN: INTRAVENOUS | Status: DC

## 2020-11-11 MED ORDER — BUPIVACAINE HCL 0.25 % IJ SOLN
INTRAMUSCULAR | Status: AC
  Filled 2020-11-11: qty 1

## 2020-11-11 MED ORDER — PHENYLEPHRINE 40 MCG/ML (10ML) SYRINGE FOR IV PUSH (FOR BLOOD PRESSURE SUPPORT)
PREFILLED_SYRINGE | INTRAVENOUS | Status: AC
  Filled 2020-11-11: qty 10

## 2020-11-11 MED ORDER — PHENYLEPHRINE 40 MCG/ML (10ML) SYRINGE FOR IV PUSH (FOR BLOOD PRESSURE SUPPORT)
PREFILLED_SYRINGE | INTRAVENOUS | Status: AC
  Filled 2020-11-11: qty 20

## 2020-11-11 MED ORDER — PHENYLEPHRINE 40 MCG/ML (10ML) SYRINGE FOR IV PUSH (FOR BLOOD PRESSURE SUPPORT)
PREFILLED_SYRINGE | INTRAVENOUS | Status: DC | PRN
Start: 2020-11-11 — End: 2020-11-11
  Administered 2020-11-11: 80 ug via INTRAVENOUS

## 2020-11-11 MED ORDER — DEXAMETHASONE SODIUM PHOSPHATE 4 MG/ML IJ SOLN: 4.0000 mg | INTRAMUSCULAR | Status: DC

## 2020-11-11 MED ORDER — SODIUM CHLORIDE 0.9% FLUSH: 3.0000 mL | Freq: Two times a day (BID) | INTRAVENOUS | Status: DC

## 2020-11-11 MED ORDER — PROPOFOL 10 MG/ML IV BOLUS
INTRAVENOUS | Status: AC
  Filled 2020-11-11: qty 20

## 2020-11-11 MED ORDER — CHLORHEXIDINE GLUCONATE 0.12 % MT SOLN
15.0000 mL | Freq: Once | OROMUCOSAL | Status: AC
  Administered 2020-11-11: 15 mL via OROMUCOSAL

## 2020-11-11 MED ORDER — FENTANYL CITRATE (PF) 100 MCG/2ML IJ SOLN
25.0000 ug | INTRAMUSCULAR | Status: DC | PRN
  Administered 2020-11-11: 25 ug via INTRAVENOUS
  Administered 2020-11-11: 50 ug via INTRAVENOUS

## 2020-11-11 MED ORDER — STERILE WATER FOR IRRIGATION IR SOLN
Status: DC | PRN
  Administered 2020-11-11: 1000 mL

## 2020-11-11 MED ORDER — MIDAZOLAM HCL 5 MG/5ML IJ SOLN
INTRAMUSCULAR | Status: DC | PRN
Start: 2020-11-11 — End: 2020-11-11
  Administered 2020-11-11: 2 mg via INTRAVENOUS

## 2020-11-11 SURGICAL SUPPLY — 99 items
ADH SKN CLS APL DERMABOND .7 (GAUZE/BANDAGES/DRESSINGS) ×4
AGENT HMST KT MTR STRL THRMB (HEMOSTASIS)
APL PRP STRL LF DISP 70% ISPRP (MISCELLANEOUS) ×2
APL SRG 38 LTWT LNG FL B (MISCELLANEOUS) ×2
APPLICATOR ARISTA FLEXITIP XL (MISCELLANEOUS) ×3 IMPLANT
ATTRACTOMAT 16X20 MAGNETIC DRP (DRAPES) IMPLANT
BACTOSHIELD CHG 4% 4OZ (MISCELLANEOUS) ×2
BAG COUNTER SPONGE SURGICOUNT (BAG) ×2 IMPLANT
BAG SPEC RTRVL LRG 6X4 10 (ENDOMECHANICALS)
BAG SPNG CNTER NS LX DISP (BAG) ×2
BAG SURGICOUNT SPONGE COUNTING (BAG) ×1
BLADE EXTENDED COATED 6.5IN (ELECTRODE) ×4 IMPLANT
CABLE HIGH FREQUENCY MONO STRZ (ELECTRODE) ×3 IMPLANT
CELLS DAT CNTRL 66122 CELL SVR (MISCELLANEOUS) IMPLANT
CHLORAPREP W/TINT 26 (MISCELLANEOUS) ×4 IMPLANT
CLIP VESOCCLUDE LG 6/CT (CLIP) ×4 IMPLANT
CLIP VESOCCLUDE MED 6/CT (CLIP) ×4 IMPLANT
CLIP VESOCCLUDE MED LG 6/CT (CLIP) ×4 IMPLANT
CNTNR URN SCR LID CUP LEK RST (MISCELLANEOUS) IMPLANT
CONT SPEC 4OZ STRL OR WHT (MISCELLANEOUS)
COVER SURGICAL LIGHT HANDLE (MISCELLANEOUS) ×4 IMPLANT
DECANTER SPIKE VIAL GLASS SM (MISCELLANEOUS) IMPLANT
DERMABOND ADVANCED (GAUZE/BANDAGES/DRESSINGS) ×4
DERMABOND ADVANCED .7 DNX12 (GAUZE/BANDAGES/DRESSINGS) ×3 IMPLANT
DRAPE INCISE IOBAN 66X45 STRL (DRAPES) IMPLANT
DRAPE SURG IRRIG POUCH 19X23 (DRAPES) ×4 IMPLANT
DRAPE WARM FLUID 44X44 (DRAPES) ×4 IMPLANT
DRSG OPSITE POSTOP 4X10 (GAUZE/BANDAGES/DRESSINGS) IMPLANT
DRSG OPSITE POSTOP 4X6 (GAUZE/BANDAGES/DRESSINGS) IMPLANT
DRSG OPSITE POSTOP 4X8 (GAUZE/BANDAGES/DRESSINGS) IMPLANT
ELECT REM PT RETURN 15FT ADLT (MISCELLANEOUS) ×4 IMPLANT
GAUZE 4X4 16PLY ~~LOC~~+RFID DBL (SPONGE) ×6 IMPLANT
GLOVE SURG ENC MOIS LTX SZ6 (GLOVE) ×8 IMPLANT
GLOVE SURG ENC MOIS LTX SZ6.5 (GLOVE) ×8 IMPLANT
GOWN STRL REUS W/ TWL LRG LVL3 (GOWN DISPOSABLE) ×4 IMPLANT
GOWN STRL REUS W/TWL LRG LVL3 (GOWN DISPOSABLE) ×8
HEMOSTAT ARISTA ABSORB 3G PWDR (HEMOSTASIS) ×3 IMPLANT
HOLDER FOLEY CATH W/STRAP (MISCELLANEOUS) IMPLANT
IRRIG SUCT STRYKERFLOW 2 WTIP (MISCELLANEOUS) ×4
IRRIGATION SUCT STRKRFLW 2 WTP (MISCELLANEOUS) ×1 IMPLANT
KIT BASIN OR (CUSTOM PROCEDURE TRAY) ×4 IMPLANT
KIT TURNOVER KIT A (KITS) ×4 IMPLANT
LIGASURE IMPACT 36 18CM CVD LR (INSTRUMENTS) IMPLANT
LOOP VESSEL MAXI BLUE (MISCELLANEOUS) IMPLANT
MANIPULATOR UTERINE 4.5 ZUMI (MISCELLANEOUS) IMPLANT
NEEDLE HYPO 22GX1.5 SAFETY (NEEDLE) ×8 IMPLANT
NS IRRIG 1000ML POUR BTL (IV SOLUTION) ×8 IMPLANT
PACK GENERAL/GYN (CUSTOM PROCEDURE TRAY) ×4 IMPLANT
PAD POSITIONING PINK XL (MISCELLANEOUS) ×4 IMPLANT
PENCIL SMOKE EVACUATOR (MISCELLANEOUS) IMPLANT
POUCH SPECIMEN RETRIEVAL 10MM (ENDOMECHANICALS) IMPLANT
RELOAD PROXIMATE 75MM BLUE (ENDOMECHANICALS) IMPLANT
RELOAD PROXIMATE TA60MM BLUE (ENDOMECHANICALS) IMPLANT
RELOAD STAPLE 60 BLU REG PROX (ENDOMECHANICALS) IMPLANT
RELOAD STAPLE 75 3.8 BLU REG (ENDOMECHANICALS) IMPLANT
RETRACTOR WND ALEXIS 18 MED (MISCELLANEOUS) IMPLANT
RETRACTOR WND ALEXIS 25 LRG (MISCELLANEOUS) IMPLANT
RTRCTR WOUND ALEXIS 18CM MED (MISCELLANEOUS)
RTRCTR WOUND ALEXIS 25CM LRG (MISCELLANEOUS)
SCISSORS LAP 5X35 DISP (ENDOMECHANICALS) ×3 IMPLANT
SCRUB CHG 4% DYNA-HEX 4OZ (MISCELLANEOUS) ×2 IMPLANT
SEALER TISSUE G2 CVD JAW 45CM (ENDOMECHANICALS) IMPLANT
SET TUBE SMOKE EVAC HIGH FLOW (TUBING) ×3 IMPLANT
SHEET LAVH (DRAPES) ×4 IMPLANT
SLEEVE SUCTION CATH 165 (SLEEVE) ×4 IMPLANT
SLEEVE XCEL OPT CAN 5 100 (ENDOMECHANICALS) ×4 IMPLANT
SOL ANTI FOG 6CC (MISCELLANEOUS) ×1 IMPLANT
SOLUTION ANTI FOG 6CC (MISCELLANEOUS) ×2
SPONGE T-LAP 18X18 ~~LOC~~+RFID (SPONGE) IMPLANT
STAPLER GUN LINEAR PROX 60 (STAPLE) IMPLANT
STAPLER PROXIMATE 75MM BLUE (STAPLE) IMPLANT
STAPLER VISISTAT 35W (STAPLE) IMPLANT
SURGIFLO W/THROMBIN 8M KIT (HEMOSTASIS) IMPLANT
SUT MNCRL AB 4-0 PS2 18 (SUTURE) ×8 IMPLANT
SUT PDS AB 1 TP1 96 (SUTURE) ×2 IMPLANT
SUT SILK 3 0 SH CR/8 (SUTURE) IMPLANT
SUT VIC AB 0 CT1 36 (SUTURE) ×4 IMPLANT
SUT VIC AB 2-0 CT1 27 (SUTURE)
SUT VIC AB 2-0 CT1 36 (SUTURE) ×2 IMPLANT
SUT VIC AB 2-0 CT1 TAPERPNT 27 (SUTURE) ×2 IMPLANT
SUT VIC AB 2-0 CT2 27 (SUTURE) ×6 IMPLANT
SUT VIC AB 2-0 SH 27 (SUTURE)
SUT VIC AB 2-0 SH 27X BRD (SUTURE) IMPLANT
SUT VIC AB 3-0 CTX 36 (SUTURE) IMPLANT
SUT VIC AB 3-0 SH 18 (SUTURE) IMPLANT
SUT VIC AB 3-0 SH 27 (SUTURE)
SUT VIC AB 3-0 SH 27X BRD (SUTURE) ×1 IMPLANT
SUT VIC AB 4-0 PS2 18 (SUTURE) ×6 IMPLANT
SYR 30ML LL (SYRINGE) ×8 IMPLANT
SYS RETRIEVAL 5MM INZII UNIV (BASKET)
SYSTEM RETRIEVL 5MM INZII UNIV (BASKET) IMPLANT
TOWEL OR 17X26 10 PK STRL BLUE (TOWEL DISPOSABLE) ×4 IMPLANT
TOWEL OR NON WOVEN STRL DISP B (DISPOSABLE) ×4 IMPLANT
TRAY FOLEY MTR SLVR 16FR STAT (SET/KITS/TRAYS/PACK) ×4 IMPLANT
TRAY LAPAROSCOPIC (CUSTOM PROCEDURE TRAY) ×4 IMPLANT
TROCAR BLADELESS OPT 5 100 (ENDOMECHANICALS) ×4 IMPLANT
TROCAR XCEL 12X100 BLDLESS (ENDOMECHANICALS) IMPLANT
TROCAR XCEL BLUNT TIP 100MML (ENDOMECHANICALS) IMPLANT
UNDERPAD 30X36 HEAVY ABSORB (UNDERPADS AND DIAPERS) ×4 IMPLANT

## 2020-11-11 NOTE — Op Note (Signed)
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   Anesthesia: GET  Urine Output: 350cc  Operative Findings: On EUA, small mobile uterus. On intra-abdominal entry, carcinomatosis appreciated studding the right anterior diaphragm, bilateral liver surfaces, mesentery, pelvic peritoneum. Omental cake with measuring up to 2x3cm. Right ovary adherent to the right uterus with peritoneal studding adjacent. Frondular implants noted along right pelvic sidewall. Miliary disease along anterior cul de sac. Cul de sac covered with friable miliary disease. Small volume dark amber ascites.  Estimated Blood Loss:  Minimal      Total IV Fluids: see I&O flowsheet         Specimens: left anterior cul de sac peritoneum, right pelvic sidewall peritoneal biopsy         Complications:  None apparent; patient tolerated the procedure well.         Disposition: PACU - hemodynamically stable.  Procedure Details  The patient was seen in the Holding Room. The risks, benefits, complications, treatment options, and expected outcomes were discussed with the patient.  The patient concurred with the proposed plan, giving informed consent.  The site of surgery properly noted/marked. The patient was identified as Robin Sellers and the procedure verified as a diagnostic laparoscopy, biopsy, possible ex-lap with staging.  After induction of anesthesia, the patient was draped and prepped in the usual sterile manner. Patient was placed in supine position after anesthesia and draped and prepped in the usual sterile manner  as follows: Her arms were tucked to her side with all appropriate precautions.  The shoulders were stabilized with padded shoulder blocks applied to the acromium processes.  The patient was placed in the semi-lithotomy position in [place].  The perineum and vagina were prepped with CholoraPrep. The patient was draped after the CholoraPrep had been allowed to dry for 3 minutes.  A Time Out was held and the above information confirmed.  The urethra was prepped with Betadine. Foley catheter was placed.  A sterile speculum was placed in the vagina.  The cervix was grasped with a single-tooth tenaculum. A Hulka was placed. OG tube placement was confirmed and to suction.   Next, a 5 mm skin incision was made 1 cm below the subcostal margin in the midclavicular line.  The 5 mm Optiview port and scope was used for direct entry.  Opening pressure was under 10 mm CO2.  The abdomen was insufflated and the findings were noted as above.   At this point and all points during the procedure, the patient's intra-abdominal pressure did not exceed 15 mmHg. Next, an 5 mm skin incision was made in the right and left abdomen and 54mm ports were placed under direct visualization.   Ascites collected to be sent. Sharp dissection was used with the anterior cul de sac elevated to excise an area of miliary disease on the peritoneum. An additional area with a frondular implant was elevated and excised from right pelvic sidewall. Hemostasis was achieved with monopolar electrocautery. Arista was placed on oozing retroperitoneum from area where peritoneum was stripped.   Irrigation was used and excellent hemostasis was achieved.  At this point in the procedure was completed.  Instruments were removed under  direct visulaization.  Abdomen was de-sufflated. The skin was closed with 4-0 Monocryl using a mattress stitch.  Dermabond was applied.    The vagina was swabbed with  minimal bleeding noted.   All sponge, lap and needle counts  were correct x  3.   The patient was transferred to the recovery room in stable condition.  Jeral Pinch, MD

## 2020-11-11 NOTE — Anesthesia Postprocedure Evaluation (Signed)
Anesthesia Post Note  Patient: Robin Sellers  Procedure(s) Performed: LAPAROSCOPY DIAGNOSTIC WITH PERITONEAL BIOPSY (Abdomen)     Patient location during evaluation: PACU Anesthesia Type: General Level of consciousness: awake and alert Pain management: pain level controlled Vital Signs Assessment: post-procedure vital signs reviewed and stable Respiratory status: spontaneous breathing, nonlabored ventilation, respiratory function stable and patient connected to nasal cannula oxygen Cardiovascular status: blood pressure returned to baseline and stable Postop Assessment: no apparent nausea or vomiting Anesthetic complications: no   No notable events documented.  Last Vitals:  Vitals:   11/11/20 1445 11/11/20 1500  BP: 124/62 132/75  Pulse: 72 76  Resp: (!) 9 14  Temp: 36.4 C   SpO2: 96% 99%    Last Pain:  Vitals:   11/11/20 1500  TempSrc:   PainSc: (S) 2                  Catalina Gravel

## 2020-11-11 NOTE — Interval H&P Note (Signed)
History and Physical Interval Note:  11/11/2020 11:52 AM  Robin Sellers  has presented today for surgery, with the diagnosis of PERITONEAL CARCINOMATOSIS.  The various methods of treatment have been discussed with the patient and family. After consideration of risks, benefits and other options for treatment, the patient has consented to  Procedure(s): LAPAROSCOPY DIAGNOSTIC WITH PERITONEAL BIOPSY (N/A) POSSIBLE HYSTERECTOMY ABDOMINAL WITH BILATERAL SALPINGO-OOPHORECTOMY (N/A) POSSIBLE TUMOR DEBULKING (N/A) POSSIBLE EXPLORATORY LAPAROTOMY (N/A) as a surgical intervention.  The patient's history has been reviewed, patient examined, no change in status, stable for surgery.  I have reviewed the patient's chart and labs.  Questions were answered to the patient's satisfaction.     [name]

## 2020-11-11 NOTE — Transfer of Care (Signed)
Immediate Anesthesia Transfer of Care Note  Patient: Robin Sellers  Procedure(s) Performed: LAPAROSCOPY DIAGNOSTIC WITH PERITONEAL BIOPSY (Abdomen)  Patient Location: PACU  Anesthesia Type:General  Level of Consciousness: sedated, patient cooperative and responds to stimulation  Airway & Oxygen Therapy: Patient Spontanous Breathing and Patient connected to face mask oxygen  Post-op Assessment: Report given to RN and Post -op Vital signs reviewed and stable  Post vital signs: Reviewed and stable  Last Vitals:  Vitals Value Taken Time  BP 140/73 11/11/20 1341  Temp    Pulse 72 11/11/20 1342  Resp 15 11/11/20 1342  SpO2 100 % 11/11/20 1342  Vitals shown include unvalidated device data.  Last Pain:  Vitals:   11/11/20 1132  TempSrc:   PainSc: 0-No pain         Complications: No notable events documented.

## 2020-11-11 NOTE — Anesthesia Preprocedure Evaluation (Signed)
Anesthesia Evaluation  Patient identified by MRN, date of birth, ID band Patient awake    Reviewed: Allergy & Precautions, NPO status , Patient's Chart, lab work & pertinent test results  Airway Mallampati: II  TM Distance: >3 FB Neck ROM: Full    Dental  (+) Teeth Intact, Dental Advisory Given   Pulmonary neg pulmonary ROS,    Pulmonary exam normal breath sounds clear to auscultation       Cardiovascular negative cardio ROS Normal cardiovascular exam Rhythm:Regular Rate:Normal     Neuro/Psych negative neurological ROS     GI/Hepatic Neg liver ROS, PERITONEAL CARCINOMATOSIS   Endo/Other  negative endocrine ROS  Renal/GU negative Renal ROS     Musculoskeletal negative musculoskeletal ROS (+)   Abdominal   Peds  Hematology negative hematology ROS (+)   Anesthesia Other Findings Day of surgery medications reviewed with the patient.  Reproductive/Obstetrics                             Anesthesia Physical Anesthesia Plan  ASA: 3  Anesthesia Plan: General   Post-op Pain Management:    Induction: Intravenous  PONV Risk Score and Plan: 4 or greater and Midazolam, Dexamethasone and Ondansetron  Airway Management Planned: Oral ETT  Additional Equipment:   Intra-op Plan:   Post-operative Plan: Extubation in OR  Informed Consent: I have reviewed the patients History and Physical, chart, labs and discussed the procedure including the risks, benefits and alternatives for the proposed anesthesia with the patient or authorized representative who has indicated his/her understanding and acceptance.     Dental advisory given  Plan Discussed with: CRNA  Anesthesia Plan Comments:         Anesthesia Quick Evaluation

## 2020-11-11 NOTE — Anesthesia Procedure Notes (Signed)
Procedure Name: Intubation Date/Time: 11/11/2020 12:55 PM Performed by: Gean Maidens, CRNA Pre-anesthesia Checklist: Patient identified, Emergency Drugs available, Suction available, Patient being monitored and Timeout performed Patient Re-evaluated:Patient Re-evaluated prior to induction Oxygen Delivery Method: Circle system utilized Preoxygenation: Pre-oxygenation with 100% oxygen Induction Type: IV induction Ventilation: Mask ventilation without difficulty Laryngoscope Size: Mac and 3 Grade View: Grade I Tube type: Oral Tube size: 7.0 mm Number of attempts: 1 Airway Equipment and Method: Stylet Placement Confirmation: ETT inserted through vocal cords under direct vision and breath sounds checked- equal and bilateral Secured at: 21 cm Tube secured with: Tape Dental Injury: Teeth and Oropharynx as per pre-operative assessment

## 2020-11-11 NOTE — Discharge Instructions (Signed)
11/11/2020  Activity: 1. Be up and out of the bed during the day.  Take a nap if needed.  You may walk up steps but be careful and use the hand rail.  Stair climbing will tire you more than you think, you may need to stop part way and rest.   2. No lifting or straining for 6 weeks.  3. No driving until you can brake safely and are not using narcotics. For most this will be 3-7 days.   4. Shower daily.  Use soap and water on your incision and pat dry; don't rub.   Medications:  - Take ibuprofen and tylenol first line for pain control. Take these regularly (every 6 hours) to decrease the build up of pain.  - If necessary, for severe pain not relieved by ibuprofen, take oxycodone.  - While taking percocet you should take sennakot every night to reduce the likelihood of constipation. If this causes diarrhea, stop its use.  Diet: 1. Low sodium Heart Healthy Diet is recommended.  2. It is safe to use a laxative if you have difficulty moving your bowels.   Wound Care: 1. Keep clean and dry.  Shower daily. Your incisions have suture and glue on them.  Reasons to call the Doctor:  Fever - Oral temperature greater than 100.4 degrees Fahrenheit Difficulty urinating Nausea and vomiting Increased pain at the site of the incision that is unrelieved with pain medicine. Difficulty breathing with or without chest pain New calf pain especially if only on one side Sudden, continuing increased vaginal bleeding with or without clots.   Follow-up: 1. See Jeral Pinch in 3 weeks. You will have a phone visit next week once pathology back.  Contacts: For questions or concerns you should contact:  Dr. Jeral Pinch at [phone] After hours and on week-ends call [phone] and ask to speak to the physician on call for Gynecologic Oncology

## 2020-11-12 ENCOUNTER — Telehealth: Payer: Self-pay | Admitting: Oncology

## 2020-11-12 ENCOUNTER — Encounter (HOSPITAL_COMMUNITY): Payer: Self-pay | Admitting: Gynecologic Oncology

## 2020-11-12 ENCOUNTER — Telehealth: Payer: Self-pay

## 2020-11-12 DIAGNOSIS — C800 Disseminated malignant neoplasm, unspecified: Secondary | ICD-10-CM

## 2020-11-12 NOTE — Telephone Encounter (Signed)
Called Robin Sellers and scheduled new patient appointment with Dr. Alvy Bimler on 11/16/2020 at 2:00 with 1:30 arrival.

## 2020-11-12 NOTE — Telephone Encounter (Signed)
Spoke with Robin Sellers this morning. She states she is eating, drinking and urinating well. She has had a BM this morning Passing gas. She is taking senokot as prescribed and encouraged her to drink plenty of water. She denies fever or chills. Incisions are dry and intact. Her pain is controlled with tylenol and ibuprofen.  Instructed to call office with any fever, chills, purulent drainage, uncontrolled pain or any other questions or concerns. Patient verbalizes understanding.  Pt aware of post op appointments as well as the office number [phone] and after hours number [phone] to call if she has any questions or concerns

## 2020-11-14 ENCOUNTER — Other Ambulatory Visit: Payer: Self-pay

## 2020-11-14 ENCOUNTER — Emergency Department (HOSPITAL_COMMUNITY)
Admission: EM | Admit: 2020-11-14 | Discharge: 2020-11-14 | Disposition: A | Discharge status: Home / Self Care | Payer: Medicare Other | Attending: Emergency Medicine | Admitting: Emergency Medicine

## 2020-11-14 DIAGNOSIS — Z86010 Personal history of colonic polyps: Secondary | ICD-10-CM | POA: Insufficient documentation

## 2020-11-14 DIAGNOSIS — R1900 Intra-abdominal and pelvic swelling, mass and lump, unspecified site: Secondary | ICD-10-CM | POA: Insufficient documentation

## 2020-11-14 DIAGNOSIS — Z8543 Personal history of malignant neoplasm of ovary: Secondary | ICD-10-CM | POA: Insufficient documentation

## 2020-11-14 DIAGNOSIS — Z9889 Other specified postprocedural states: Secondary | ICD-10-CM

## 2020-11-14 NOTE — Discharge Instructions (Addendum)
You were evaluated in the Emergency Department and after careful evaluation, we did not find any emergent condition requiring admission or further testing in the hospital.  Your exam/testing today was overall reassuring.  We have messaged Dr. Berline Lopes about your visit to the emergency department and are hopeful she can see you soon in the office.  Please return to the Emergency Department if you experience any worsening of your condition.  Thank you for allowing [place] to be a part of your care.

## 2020-11-14 NOTE — ED Provider Notes (Signed)
[facility] Emergency Department Provider Note MRN:  [id number]  Arrival date & time: 11/14/20     Chief Complaint   Post-op Problem   History of Present Illness   Robin Sellers is a 70 y.o. year-old female with a history of ovarian cancer presenting to the ED with chief complaint of stab problem.  Patient is endorsing some swelling to the lower portion of her abdomen.  She is 3 days postop from exploratory laparoscopy and peritoneal biopsy.  Recent diagnosis of ovarian cancer.  She says that she "feels great" but noticed the swelling today getting slightly worse throughout the day.  She called the on-call number and was advised to come be evaluated in the emergency department.  Denies any fever, no vaginal bleeding or discharge, no chest pain or shortness of breath, no abdominal pain.  Review of Systems  A complete 10 system review of systems was obtained and all systems are negative except as noted in the HPI and PMH.   Patient's Health History    Past Medical History:  Diagnosis Date   Anemia    Cancer ([place])    Osteoporosis    Polyp of colon, unspecified part of colon, unspecified type    Benign    Past Surgical History:  Procedure Laterality Date   APPENDECTOMY  02/27/2007   [facility]   BILATERAL SALPINGECTOMY Right 03/1978   right ectopic, tied other tube   CATARACT EXTRACTION Right 10/31/2011   Dr. Katy Fitch   COLONOSCOPY  03/2019   Dr. Alessandra Bevels   COLONOSCOPY WITH PROPOFOL N/A 04/01/2018   Procedure: COLONOSCOPY WITH PROPOFOL;  Surgeon: Otis Brace, MD;  Location: [facility];  Service: Gastroenterology;  Laterality: N/A;   DILATION AND CURETTAGE OF UTERUS     x3, 2 for miscarriages, one for polyp vs fibroid   HAND / FINGER LESION EXCISION Left    lt. index finger   LAPAROSCOPY N/A 11/11/2020   Procedure: LAPAROSCOPY DIAGNOSTIC WITH PERITONEAL BIOPSY;  Surgeon: [name], MD;  Location: [facility];  Service: Gynecology;   Laterality: N/A;   POLYPECTOMY  04/01/2018   Procedure: POLYPECTOMY;  Surgeon: Otis Brace, MD;  Location: [facility];  Service: Gastroenterology;;   RETINAL TEAR REPAIR CRYOTHERAPY Right 04/25/2011   Dr. Dominic Pea INJECTION  04/01/2018   Procedure: SUBMUCOSAL INJECTION;  Surgeon: Otis Brace, MD;  Location: [facility];  Service: Gastroenterology;;   TUBAL LIGATION Left 03/1978    Family History  Problem Relation Age of Onset   Lung cancer Mother    Skin cancer Father    Breast cancer Daughter    Ovarian cancer Neg Hx    Endometrial cancer Neg Hx    Prostate cancer Neg Hx    Pancreatic cancer Neg Hx    Colon cancer Neg Hx     Social History   Socioeconomic History   Marital status: Legally Separated    Spouse name: Not on file   Number of children: Not on file   Years of education: Not on file   Highest education level: Not on file  Occupational History   Not on file  Tobacco Use   Smoking status: Never    Passive exposure: Past (18 years)   Smokeless tobacco: Never  Vaping Use   Vaping Use: Never used  Substance and Sexual Activity   Alcohol use: Yes    Comment: Very occaionally   Drug use: Never   Sexual activity: Not Currently  Other Topics Concern  Not on file  Social History Narrative   Not on file   Social Determinants of Health   Financial Resource Strain: Not on file  Food Insecurity: Not on file  Transportation Needs: Not on file  Physical Activity: Not on file  Stress: Not on file  Social Connections: Not on file  Intimate Partner Violence: Not on file     Physical Exam   Vitals:   11/14/20 0054  BP: (!) 124/58  Pulse: 73  Resp: 18  Temp: 98 F (36.7 C)  SpO2: 100%    CONSTITUTIONAL: Well-appearing, NAD NEURO:  Alert and oriented x 3, no focal deficits EYES:  eyes equal and reactive ENT/NECK:  no LAD, no JVD CARDIO: Regular rate, well-perfused, normal S1 and S2 PULM:  CTAB no wheezing or rhonchi GI/GU:   normal bowel sounds, non-distended, non-tender; mild edema to the lower abdomen MSK/SPINE:  No gross deformities, no edema SKIN:  no rash, atraumatic PSYCH:  Appropriate speech and behavior  *Additional and/or pertinent findings included in MDM below  Diagnostic and Interventional Summary    EKG Interpretation  Date/Time:    Ventricular Rate:    PR Interval:    QRS Duration:   QT Interval:    QTC Calculation:   R Axis:     Text Interpretation:         Labs Reviewed - No data to display  No orders to display    Medications - No data to display   Procedures  /  Critical Care Procedures  ED Course and Medical Decision Making  I have reviewed the triage vital signs, the nursing notes, and pertinent available records from the EMR.  Listed above are laboratory and imaging tests that I personally ordered, reviewed, and interpreted and then considered in my medical decision making (see below for details).  Overall a very well-appearing patient sitting upright in bed, normal vital signs, ambulating without issue.  Abdomen is completely soft and nontender with no rebound guarding or rigidity.  There is some mild swelling noted to the lower abdomen and mons area.  Suspect this is some dependent edema postoperatively, possibly also related to seroma.  Given the lack of bruising, no erythema, no increased warmth, and well-appearing incision sites there is nothing to suggest infection or any significant postoperative bleeding.  Nothing to suggest an emergent process, Dr. Berline Lopes her surgeon is messaged and hopefully she will be seen in the office soon.  Patient is agreeable with this plan and will return if symptoms worsen.       Barth Kirks. Sedonia Small, [place] [email or web address]  Final Clinical Impressions(s) / ED Diagnoses     ICD-10-CM   1. Post-operative state  [phone]       ED Discharge Orders     None        Discharge  Instructions Discussed with and Provided to Patient:    Discharge Instructions      You were evaluated in the Emergency Department and after careful evaluation, we did not find any emergent condition requiring admission or further testing in the hospital.  Your exam/testing today was overall reassuring.  We have messaged Dr. Berline Lopes about your visit to the emergency department and are hopeful she can see you soon in the office.  Please return to the Emergency Department if you experience any worsening of your condition.  Thank you for allowing [place] to be a part of your care.  Maudie Flakes, MD 11/14/20 [phone]

## 2020-11-14 NOTE — ED Triage Notes (Signed)
Pt came in with c/o pelvic swelling. Pt states that she had exploratory surgery on Thur. She was diagnosed with ovarian cancer. Pt has three laps sites to abdomen, but she noticed today that R side of her pelvis. Pt states it has progressively gotten worse today. It is tender to touch

## 2020-11-15 ENCOUNTER — Inpatient Hospital Stay (HOSPITAL_BASED_OUTPATIENT_CLINIC_OR_DEPARTMENT_OTHER): Payer: Medicare Other | Admitting: Gynecologic Oncology

## 2020-11-15 ENCOUNTER — Ambulatory Visit (HOSPITAL_COMMUNITY)
Admission: RE | Admit: 2020-11-15 | Discharge: 2020-11-15 | Disposition: A | Discharge status: Home / Self Care | Payer: Medicare Other | Source: Ambulatory Visit | Attending: Gynecologic Oncology | Admitting: Gynecologic Oncology

## 2020-11-15 ENCOUNTER — Inpatient Hospital Stay: Payer: Medicare Other

## 2020-11-15 ENCOUNTER — Telehealth: Payer: Self-pay

## 2020-11-15 ENCOUNTER — Encounter: Payer: Self-pay | Admitting: Hematology and Oncology

## 2020-11-15 ENCOUNTER — Encounter: Payer: Self-pay | Admitting: Gynecologic Oncology

## 2020-11-15 VITALS — BP 134/53 | HR 78 | Temp 97.3°F | Resp 18 | Ht 64.0 in | Wt 111.6 lb

## 2020-11-15 DIAGNOSIS — C800 Disseminated malignant neoplasm, unspecified: Secondary | ICD-10-CM

## 2020-11-15 DIAGNOSIS — C579 Malignant neoplasm of female genital organ, unspecified: Secondary | ICD-10-CM

## 2020-11-15 DIAGNOSIS — C786 Secondary malignant neoplasm of retroperitoneum and peritoneum: Secondary | ICD-10-CM | POA: Diagnosis not present

## 2020-11-15 DIAGNOSIS — R600 Localized edema: Secondary | ICD-10-CM

## 2020-11-15 DIAGNOSIS — Z7189 Other specified counseling: Secondary | ICD-10-CM

## 2020-11-15 LAB — CBC (CANCER CENTER ONLY)
HCT: 39.1 % (ref 36.0–46.0)
Hemoglobin: 12.7 g/dL (ref 12.0–15.0)
MCH: 29.3 pg (ref 26.0–34.0)
MCHC: 32.5 g/dL (ref 30.0–36.0)
MCV: 90.1 fL (ref 80.0–100.0)
Platelet Count: 176 10*3/uL (ref 150–400)
RBC: 4.34 MIL/uL (ref 3.87–5.11)
RDW: 12.6 % (ref 11.5–15.5)
WBC Count: 8.3 10*3/uL (ref 4.0–10.5)
nRBC: 0 % (ref 0.0–0.2)

## 2020-11-15 LAB — CYTOLOGY - NON PAP

## 2020-11-15 LAB — SURGICAL PATHOLOGY

## 2020-11-15 MED ORDER — IOHEXOL 350 MG/ML SOLN
80.0000 mL | Freq: Once | INTRAVENOUS | Status: AC | PRN
  Administered 2020-11-15: 80 mL via INTRAVENOUS

## 2020-11-15 NOTE — Telephone Encounter (Signed)
Received phone call from Robin Sellers this morning. Patient states she called the on-call number over the weekend due to RLQ swelling and was advised to go to the emergency room. The emergency room advised her to follow up with her doctor. Patient states her right upper leg has now started to swell. The swelling in her leg comes and goes. Patient denies pain, redness, and warmth at the site, fever and chills. She has been scheduled to follow up with Dr. Berline Lopes today at 12:00. Patient verbalizes understanding and is in agreement.

## 2020-11-15 NOTE — Patient Instructions (Signed)
I will release your blood test to you once back today.  If we need to talk about anything that it shows, I will give you a call.  As soon as I see your CT scan, I will call you with these results.  Based on your symptoms and my exam, I am suspicious that you may have had some bleeding from one of the 2 areas where I took a biopsy.  There is evidence of some bruising that tracks down to your labia on the right.  The CT scan will help [place] know if this is something that we can just keep a close eye on or if there is something else that we may need to do.

## 2020-11-15 NOTE — Progress Notes (Signed)
I called the patient, reviewed CT scan. She is relieved with this news. Discussed that she could use call therapy or compression if she feels like this helps. Otherwise, symptom should improve with time is her body reabsorbs CO2.  Valarie Cones MD

## 2020-11-15 NOTE — Progress Notes (Signed)
Gynecologic Oncology Return Clinic Visit  11/15/20  Reason for Visit: swelling  Treatment History: Diagnostic laparoscopy with peritoneal biopsies on 7/21.   Interval History: The patient reports overall meeting postoperative milestones since Thursday.  On Saturday, she developed swelling and mild pain over her right mons and low pelvis.  She thought her husband advised, who called the after-hours nursing line, and she was referred to the emergency department.  She was seen there and given no other symptoms, rather benign exam, and normal vital signs, plan was for outpatient follow-up with me.  She notes that yesterday, she had some intermittent swelling that extended down into her right inner thigh.  This has since resolved.  She used some cold therapy yesterday, which seemed to help with the swelling.  She continues to endorse only mild pain with palpation over her mons area.  She endorses normal bowel and bladder function.  She is having very light and brown spotting since surgery.  She denies any fevers or chills.  She endorses a good appetite without nausea or emesis.  She denies any lower extremity swelling, pain to the touch, or erythema.  Past Medical/Surgical History: Past Medical History:  Diagnosis Date   Anemia    Cancer ([place])    Osteoporosis    Polyp of colon, unspecified part of colon, unspecified type    Benign    Past Surgical History:  Procedure Laterality Date   APPENDECTOMY  02/27/2007   [facility]   BILATERAL SALPINGECTOMY Right 03/1978   right ectopic, tied other tube   CATARACT EXTRACTION Right 10/31/2011   Dr. Katy Fitch   COLONOSCOPY  03/2019   Dr. Alessandra Bevels   COLONOSCOPY WITH PROPOFOL N/A 04/01/2018   Procedure: COLONOSCOPY WITH PROPOFOL;  Surgeon: Otis Brace, MD;  Location: [facility];  Service: Gastroenterology;  Laterality: N/A;   DILATION AND CURETTAGE OF UTERUS     x3, 2 for miscarriages, one for polyp vs fibroid   HAND / FINGER LESION EXCISION Left     lt. index finger   LAPAROSCOPY N/A 11/11/2020   Procedure: LAPAROSCOPY DIAGNOSTIC WITH PERITONEAL BIOPSY;  Surgeon: [name], MD;  Location: [facility];  Service: Gynecology;  Laterality: N/A;   POLYPECTOMY  04/01/2018   Procedure: POLYPECTOMY;  Surgeon: Otis Brace, MD;  Location: [facility];  Service: Gastroenterology;;   RETINAL TEAR REPAIR CRYOTHERAPY Right 04/25/2011   Dr. Dominic Pea INJECTION  04/01/2018   Procedure: SUBMUCOSAL INJECTION;  Surgeon: Otis Brace, MD;  Location: [facility];  Service: Gastroenterology;;   TUBAL LIGATION Left 03/1978    Family History  Problem Relation Age of Onset   Lung cancer Mother    Skin cancer Father    Breast cancer Daughter    Ovarian cancer Neg Hx    Endometrial cancer Neg Hx    Prostate cancer Neg Hx    Pancreatic cancer Neg Hx    Colon cancer Neg Hx     Social History   Socioeconomic History   Marital status: Legally Separated    Spouse name: Not on file   Number of children: Not on file   Years of education: Not on file   Highest education level: Not on file  Occupational History   Not on file  Tobacco Use   Smoking status: Never    Passive exposure: Past (18 years)   Smokeless tobacco: Never  Vaping Use   Vaping Use: Never used  Substance and Sexual Activity   Alcohol use: Yes    Comment:  Very occaionally   Drug use: Never   Sexual activity: Not Currently  Other Topics Concern   Not on file  Social History Narrative   Not on file   Social Determinants of Health   Financial Resource Strain: Not on file  Food Insecurity: Not on file  Transportation Needs: Not on file  Physical Activity: Not on file  Stress: Not on file  Social Connections: Not on file    Current Medications:  Current Outpatient Medications:    Biotin 5000 MCG TABS, Take 5,000 mcg by mouth daily., Disp: , Rfl:    Calcium Carbonate-Vitamin D 600-400 MG-UNIT tablet, Take 1 tablet by mouth daily., Disp: , Rfl:     Cholecalciferol 50 MCG (2000 UT) CAPS, Take 2,000 Units by mouth daily., Disp: , Rfl:    ibuprofen (ADVIL) 800 MG tablet, Take 1 tablet (800 mg total) by mouth every 8 (eight) hours as needed for moderate pain. For AFTER surgery only, Disp: 30 tablet, Rfl: 0   Multiple Vitamin (MULTIVITAMIN WITH MINERALS) TABS tablet, Take 1 tablet by mouth daily. WITH BIOTIN, Disp: , Rfl:    risedronate (ACTONEL) 150 MG tablet, Take 150 mg by mouth every 30 (thirty) days., Disp: , Rfl:    senna-docusate (SENOKOT-S) 8.6-50 MG tablet, Take 2 tablets by mouth at bedtime. For AFTER surgery, do not take if having diarrhea, Disp: 30 tablet, Rfl: 0   traMADol (ULTRAM) 50 MG tablet, Take 1 tablet (50 mg total) by mouth every 6 (six) hours as needed for severe pain. For AFTER surgery only, do not take and drive, Disp: 10 tablet, Rfl: 0   tretinoin (RETIN-A) 0.025 % cream, Apply 1 application topically at bedtime as needed (blemishes)., Disp: , Rfl:   Review of Systems: Pertinent positives per HPI. Denies appetite changes, fevers, chills, fatigue, unexplained weight changes. Denies hearing loss, neck lumps or masses, mouth sores, ringing in ears or voice changes. Denies cough or wheezing.  Denies shortness of breath. Denies chest pain or palpitations.  Denies abdominal distention, pain, blood in stools, constipation, diarrhea, nausea, vomiting, or early satiety. Denies pain with intercourse, dysuria, frequency, hematuria or incontinence. Denies hot flashes, pelvic pain, vaginal bleeding or vaginal discharge.   Denies joint pain, back pain or muscle pain/cramps. Denies itching, rash, or wounds. Denies dizziness, headaches, numbness or seizures. Denies swollen lymph nodes or glands, denies easy bruising or bleeding. Denies anxiety, depression, confusion, or decreased concentration.  Physical Exam: BP (!) 134/53 (BP Location: Right Arm, Patient Position: Sitting)   Pulse 78   Temp (!) 97.3 F (36.3 C) (Tympanic)    Resp 18   Ht _0  (1.626 m)   Wt 111 lb 9.6 oz (50.6 kg)   SpO2 100%   BMI 19.16 kg/m  General: Alert, oriented, no acute distress. HEENT: Posterior oropharynx clear, sclera anicteric. Chest: Unlabored breathing on room air. Abdomen: soft, nontender in upper and mid abdomen, nondistended.  Incision sites are healing well with Dermabond in place.  No masses appreciated.  In the very low abdomen and over the pubic symphysis, there is some edema with moderate tenderness to deeper palpation on the superior and right aspect of the mons.  Area feels very mildly warm to the touch, nonblanching. Extremities: Grossly normal range of motion.  Warm, well perfused.  No appreciable edema of the right upper leg. GU: Normal appearing external genitalia, mild bruising noted along the right labia.  Bimanual exam reveals mobile uterus, some fullness appreciated in the anterior cul-de-sac with palpation  using the abdominal hand.  Laboratory & Radiologic Studies: A. CUL DE SAC, LEFT ANTERIOR, EXCISION:  -  Poorly differentiated carcinoma  -  See comment   B. SIDEWALL, RIGHT, BIOPSY:  -  Poorly differentiated carcinoma  -  See comment   COMMENT:   By immunohistochemistry, the neoplastic cells are positive for  cytokeratin 7, p53, PAX8 and WT1 but negative for cytokeratin 20, GATA3,  CDX2 and TTF-1.  Overall, the immunophenotype is consistent with a  gynecologic primary. Dr. Berline Lopes was notified of these results on December 16, 2020.   Cytology: FINAL MICROSCOPIC DIAGNOSIS:  - Malignant cells consistent with metastatic adenocarcinoma   Assessment & Plan: Robin Sellers is a 70 y.o. 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  The patient is overall meeting postoperative milestones.  In the setting of her mons edema extending into the right leg and bruising of the right labia, I suspect that she had some  bleeding after surgery related to the peritoneal stripping for biopsy.  We will plan to check a CBC today and have ordered a CT scan.  I will call her with these results.    I reviewed in detail pictures that I took during surgery with findings as well as final pathology, which resulted this morning.  This confirms a high-grade adenocarcinoma of GYN origin.  The patient is set up to see Dr. Alvy Bimler tomorrow.  We reviewed plan again as previously discussed at her initial visit to start with neoadjuvant chemotherapy.  Given burden of disease, optimal cytoreduction would not be possible at this time.  We will plan for 3 cycles of chemotherapy followed by interval imaging to assess for possible surgery.  32 minutes of total time was spent for this patient encounter, including preparation, face-to-face counseling with the patient and coordination of care, and documentation of the encounter.  Jeral Pinch, MD  Division of Gynecologic Oncology  Department of Obstetrics and Gynecology  [facility]

## 2020-11-15 NOTE — Telephone Encounter (Signed)
Spoke with Robin Sellers and let her know her CT has been scheduled at [facility] today at 4 pm. She is to drink her first bottle of contrast at 2pm and her second bottle at 3pm. She is to arrive at 3:45pm. Patient verbalized understanding and is in agreement.

## 2020-11-16 ENCOUNTER — Encounter: Payer: Self-pay | Admitting: Hematology and Oncology

## 2020-11-16 ENCOUNTER — Inpatient Hospital Stay (HOSPITAL_BASED_OUTPATIENT_CLINIC_OR_DEPARTMENT_OTHER): Payer: Medicare Other | Admitting: Hematology and Oncology

## 2020-11-16 ENCOUNTER — Other Ambulatory Visit: Payer: Self-pay

## 2020-11-16 VITALS — BP 140/61 | HR 93 | Temp 98.7°F | Resp 18 | Ht 64.0 in | Wt 112.4 lb

## 2020-11-16 DIAGNOSIS — C786 Secondary malignant neoplasm of retroperitoneum and peritoneum: Secondary | ICD-10-CM

## 2020-11-16 DIAGNOSIS — C482 Malignant neoplasm of peritoneum, unspecified: Secondary | ICD-10-CM

## 2020-11-16 DIAGNOSIS — C579 Malignant neoplasm of female genital organ, unspecified: Secondary | ICD-10-CM

## 2020-11-16 MED ORDER — PROCHLORPERAZINE MALEATE 10 MG PO TABS: 10.0000 mg | ORAL_TABLET | Freq: Four times a day (QID) | ORAL | 1 refills | Status: DC | PRN

## 2020-11-16 MED ORDER — ONDANSETRON HCL 8 MG PO TABS: 8.0000 mg | ORAL_TABLET | Freq: Three times a day (TID) | ORAL | 1 refills | Status: DC | PRN

## 2020-11-16 MED ORDER — DEXAMETHASONE 4 MG PO TABS: ORAL_TABLET | ORAL | 6 refills | Status: DC

## 2020-11-16 NOTE — Assessment & Plan Note (Signed)
I reviewed plan of care with the patient and her spouse We reviewed recent CT imaging We discussed neoadjuvant approach to her disease  We reviewed the NCCN guidelines We discussed the role of chemotherapy. The intent is of curative intent.  We discussed some of the risks, benefits, side-effects of carboplatin & Taxol. Treatment is intravenous, every 3 weeks x 6 cycles  Some of the short term side-effects included, though not limited to, including weight loss, life threatening infections, risk of allergic reactions, need for transfusions of blood products, nausea, vomiting, change in bowel habits, loss of hair, admission to hospital for various reasons, and risks of death.   Long term side-effects are also discussed including risks of infertility, permanent damage to nerve function, hearing loss, chronic fatigue, kidney damage with possibility needing hemodialysis, and rare secondary malignancy including bone marrow disorders.  The patient is aware that the response rates discussed earlier is not guaranteed.  After a long discussion, patient made an informed decision to proceed with the prescribed plan of care.   Patient education material was dispensed. We discussed premedication with dexamethasone before chemotherapy.  I will schedule chemo education class tomorrow We discussed cranial prosthesis versus hair preservation with Dignicap; she is undecided but will let us know her final decision tomorrow so that we can get her scheduled for first dose of treatment as soon as possible I will see her prior to cycle 2 of treatment

## 2020-11-16 NOTE — Progress Notes (Signed)
[place] NOTE  Patient Care Team: Lorene Dy, MD as PCP - General (Internal Medicine)  ASSESSMENT & PLAN:  Primary peritoneal carcinomatosis [facility]) I reviewed plan of care with the patient and her spouse We reviewed recent CT imaging We discussed neoadjuvant approach to her disease  We reviewed the NCCN guidelines We discussed the role of chemotherapy. The intent is of curative intent.  We discussed some of the risks, benefits, side-effects of carboplatin & Taxol. Treatment is intravenous, every 3 weeks x 6 cycles  Some of the short term side-effects included, though not limited to, including weight loss, life threatening infections, risk of allergic reactions, need for transfusions of blood products, nausea, vomiting, change in bowel habits, loss of hair, admission to hospital for various reasons, and risks of death.   Long term side-effects are also discussed including risks of infertility, permanent damage to nerve function, hearing loss, chronic fatigue, kidney damage with possibility needing hemodialysis, and rare secondary malignancy including bone marrow disorders.  The patient is aware that the response rates discussed earlier is not guaranteed.  After a long discussion, patient made an informed decision to proceed with the prescribed plan of care.   Patient education material was dispensed. We discussed premedication with dexamethasone before chemotherapy.  I will schedule chemo education class tomorrow We discussed cranial prosthesis versus hair preservation with Dignicap; she is undecided but will let us know her final decision tomorrow so that we can get her scheduled for first dose of treatment as soon as possible I will see her prior to cycle 2 of treatment  Orders Placed This Encounter  Procedures   CBC with Differential ([place] Only)    Standing Status:   Standing    Number of Occurrences:   20    Standing Expiration Date:    11/16/2021   CMP ([place] only)    Standing Status:   Standing    Number of Occurrences:   20    Standing Expiration Date:   11/16/2021   CA 125    Standing Status:   Standing    Number of Occurrences:   11    Standing Expiration Date:   11/16/2021    The total time spent in the appointment was 60 minutes encounter with patients including review of chart and various tests results, discussions about plan of care and coordination of care plan   All questions were answered. The patient knows to call the clinic with any problems, questions or concerns. No barriers to learning was detected.  [name], MD 7/26/20224:07 PM  CHIEF COMPLAINTS/PURPOSE OF CONSULTATION:  Primary peritoneal carcinomatosis, for further management  HISTORY OF PRESENTING ILLNESS:  Robin Sellers 70 y.o. female is here because of recent diagnosis of cancer "Robin Sellers" is here accompanied by her spouse She is a semiretired bookkeeper Her symptoms began several months ago with right upper quadrant pain leading to multiple imaging studies and subsequent biopsy She was seen in the emergency department due to recent swelling but that has improved  I have reviewed her chart and materials related to her cancer extensively and collaborated history with the patient. Summary of oncologic history is as follows: Oncology History  Primary peritoneal carcinomatosis ([place])  08/21/2020 Initial Diagnosis   The patient reports intermittent right upper quadrant pain that feel like she pulled a muscle beginning in March 2022.  In April, she woke up with sharp pain in her right upper quadrant.  This was her first episode of  sharp pain.  She thought that her pain was related to her gallbladder and presented to the emergency department.  She was seen in ED on 4/30. RUQ ultrasound and x-ray were normal as were LFTs and lipase.    10/27/2020 Imaging   Findings highly suspicious for peritoneal carcinomatosis. No site of primary  malignancy identified.   Colonic diverticulosis, without radiographic evidence of diverticulitis   11/02/2020 Initial Diagnosis   Primary peritoneal carcinomatosis ([place])   11/02/2020 Tumor Marker   Patient's tumor was tested for the following markers: CA-125. Results of the tumor marker test revealed 88.   11/11/2020 Pathology Results   FINAL MICROSCOPIC DIAGNOSIS:   A. CUL DE SAC, LEFT ANTERIOR, EXCISION:  -  Poorly differentiated carcinoma  -  See comment   B. SIDEWALL, RIGHT, BIOPSY:  -  Poorly differentiated carcinoma  -  See comment   COMMENT:   By immunohistochemistry, the neoplastic cells are positive for cytokeratin 7, p53, PAX8 and WT1 but negative for cytokeratin 20, GATA3, CDX2 and TTF-1.  Overall, the immunophenotype is consistent with a  gynecologic primary.    11/11/2020 Pathology Results   FINAL MICROSCOPIC DIAGNOSIS:  - Malignant cells consistent with metastatic adenocarcinoma    11/11/2020 Surgery   Pre-operative Diagnosis: Carcinomatosis, mildly elevated CA-125   Post-operative Diagnosis: same, At least stage IIIC gyn malignancy   Operation: Diagnostic laparoscopy, peritoneal biopsies    Surgeon: [name] Operative Findings: On EUA, small mobile uterus. On intra-abdominal entry, carcinomatosis appreciated studding the right anterior diaphragm, bilateral liver surfaces, mesentery, pelvic peritoneum. Omental cake with measuring up to 2x3cm. Right ovary adherent to the right uterus with peritoneal studding adjacent. Frondular implants noted along right pelvic sidewall. Miliary disease along anterior cul de sac. Cul de sac covered with friable miliary disease. Small volume dark amber ascites. Specimens: left anterior cul de sac peritoneum, right pelvic sidewall peritoneal biopsy          11/15/2020 Cancer Staging   Staging form: Ovary, Fallopian Tube, and Primary Peritoneal Carcinoma, AJCC 8th Edition - Clinical stage from 11/15/2020: FIGO Stage III  (cT3, cN0, cM0) - Signed by [name], MD on 11/15/2020  Stage prefix: Initial diagnosis    11/17/2020 -  Chemotherapy    Patient is on Treatment Plan: OVARIAN CARBOPLATIN (AUC 6) / PACLITAXEL (175) Q21D X 6 CYCLES         MEDICAL HISTORY:  Past Medical History:  Diagnosis Date   Anemia    Cancer ([place])    Osteoporosis    Polyp of colon, unspecified part of colon, unspecified type    Benign    SURGICAL HISTORY: Past Surgical History:  Procedure Laterality Date   APPENDECTOMY  02/27/2007   [facility]   BILATERAL SALPINGECTOMY Right 03/1978   right ectopic, tied other tube   CATARACT EXTRACTION Right 10/31/2011   Dr. Katy Fitch   COLONOSCOPY  03/2019   Dr. Alessandra Bevels   COLONOSCOPY WITH PROPOFOL N/A 04/01/2018   Procedure: COLONOSCOPY WITH PROPOFOL;  Surgeon: Otis Brace, MD;  Location: [facility];  Service: Gastroenterology;  Laterality: N/A;   DILATION AND CURETTAGE OF UTERUS     x3, 2 for miscarriages, one for polyp vs fibroid   HAND / FINGER LESION EXCISION Left    lt. index finger   LAPAROSCOPY N/A 11/11/2020   Procedure: LAPAROSCOPY DIAGNOSTIC WITH PERITONEAL BIOPSY;  Surgeon: [name], MD;  Location: [facility] ORS;  Service: Gynecology;  Laterality: N/A;   POLYPECTOMY  04/01/2018   Procedure: POLYPECTOMY;  Surgeon: Otis Brace, MD;  Location: [facility];  Service: Gastroenterology;;   RETINAL TEAR REPAIR CRYOTHERAPY Right 04/25/2011   Dr. Dominic Pea INJECTION  04/01/2018   Procedure: SUBMUCOSAL INJECTION;  Surgeon: Otis Brace, MD;  Location: [facility];  Service: Gastroenterology;;   TUBAL LIGATION Left 03/1978    SOCIAL HISTORY: Social History   Socioeconomic History   Marital status: Legally Separated    Spouse name: Not on file   Number of children: Not on file   Years of education: Not on file   Highest education level: Not on file  Occupational History   Not on file  Tobacco Use   Smoking status: Never    Passive  exposure: Past (18 years)   Smokeless tobacco: Never  Vaping Use   Vaping Use: Never used  Substance and Sexual Activity   Alcohol use: Yes    Comment: Very occaionally   Drug use: Never   Sexual activity: Not Currently  Other Topics Concern   Not on file  Social History Narrative   Not on file   Social Determinants of Health   Financial Resource Strain: Not on file  Food Insecurity: Not on file  Transportation Needs: Not on file  Physical Activity: Not on file  Stress: Not on file  Social Connections: Not on file  Intimate Partner Violence: Not on file    FAMILY HISTORY: Family History  Problem Relation Age of Onset   Lung cancer Mother    Skin cancer Father    Breast cancer Daughter    Ovarian cancer Neg Hx    Endometrial cancer Neg Hx    Prostate cancer Neg Hx    Pancreatic cancer Neg Hx    Colon cancer Neg Hx     ALLERGIES:  is allergic to artichoke [place] scolymus (artichoke)].  MEDICATIONS:  Current Outpatient Medications  Medication Sig Dispense Refill   dexamethasone (DECADRON) 4 MG tablet Take 2 tabs at the night before and 2 tab the morning of chemotherapy, every 3 weeks, by mouth x 6 cycles 36 tablet 6   Biotin 5000 MCG TABS Take 5,000 mcg by mouth daily.     Calcium Carbonate-Vitamin D 600-400 MG-UNIT tablet Take 1 tablet by mouth daily.     Cholecalciferol 50 MCG (2000 UT) CAPS Take 2,000 Units by mouth daily.     Multiple Vitamin (MULTIVITAMIN WITH MINERALS) TABS tablet Take 1 tablet by mouth daily. WITH BIOTIN     ondansetron (ZOFRAN) 8 MG tablet Take 1 tablet (8 mg total) by mouth every 8 (eight) hours as needed for refractory nausea / vomiting. 30 tablet 1   prochlorperazine (COMPAZINE) 10 MG tablet Take 1 tablet (10 mg total) by mouth every 6 (six) hours as needed (Nausea or vomiting). 30 tablet 1   risedronate (ACTONEL) 150 MG tablet Take 150 mg by mouth every 30 (thirty) days.     senna-docusate (SENOKOT-S) 8.6-50 MG tablet Take 2 tablets by  mouth at bedtime. For AFTER surgery, do not take if having diarrhea 30 tablet 0   traMADol (ULTRAM) 50 MG tablet Take 1 tablet (50 mg total) by mouth every 6 (six) hours as needed for severe pain. For AFTER surgery only, do not take and drive 10 tablet 0   tretinoin (RETIN-A) 0.025 % cream Apply 1 application topically at bedtime as needed (blemishes).     No current facility-administered medications for this visit.    REVIEW OF SYSTEMS:   Constitutional: Denies fevers, chills or abnormal night  sweats Eyes: Denies blurriness of vision, double vision or watery eyes Ears, nose, mouth, throat, and face: Denies mucositis or sore throat Respiratory: Denies cough, dyspnea or wheezes Cardiovascular: Denies palpitation, chest discomfort or lower extremity swelling Gastrointestinal:  Denies nausea, heartburn or change in bowel habits Skin: Denies abnormal skin rashes Lymphatics: Denies new lymphadenopathy or easy bruising Neurological:Denies numbness, tingling or new weaknesses Behavioral/Psych: Mood is stable, no new changes  All other systems were reviewed with the patient and are negative.  PHYSICAL EXAMINATION: ECOG PERFORMANCE STATUS: 1 - Symptomatic but completely ambulatory  Vitals:   11/16/20 1345  BP: 140/61  Pulse: 93  Resp: 18  Temp: 98.7 F (37.1 C)  SpO2: 100%   Filed Weights   11/16/20 1345  Weight: 112 lb 6.4 oz (51 kg)    GENERAL:alert, no distress and comfortable SKIN: skin color, texture, turgor are normal, no rashes or significant lesions EYES: normal, conjunctiva are pink and non-injected, sclera clear OROPHARYNX:no exudate, no erythema and lips, buccal mucosa, and tongue normal  NECK: supple, thyroid normal size, non-tender, without nodularity LYMPH:  no palpable lymphadenopathy in the cervical, axillary or inguinal LUNGS: clear to auscultation and percussion with normal breathing effort HEART: regular rate & rhythm and no murmurs and no lower extremity  edema ABDOMEN:abdomen soft, non-tender and normal bowel sounds.  Noted well-healed surgical scar Musculoskeletal:no cyanosis of digits and no clubbing  PSYCH: alert & oriented x 3 with fluent speech NEURO: no focal motor/sensory deficits  LABORATORY DATA:  I have reviewed the data as listed Lab Results  Component Value Date   WBC 8.3 11/15/2020   HGB 12.7 11/15/2020   HCT 39.1 11/15/2020   MCV 90.1 11/15/2020   PLT 176 11/15/2020   Recent Labs    08/21/20 1846 11/08/20 1415  NA 140 139  K 3.9 4.2  CL 103 105  CO2 30 23  GLUCOSE 81 110*  BUN 15 24*  CREATININE 0.77 0.73  CALCIUM 9.4 9.6  GFRNONAA >60 >60  PROT 7.8 7.4  ALBUMIN 4.6 4.6  AST 26 22  ALT 19 16  ALKPHOS 41 40  BILITOT 0.9 0.6    RADIOGRAPHIC STUDIES: I have personally reviewed the radiological images as listed and agreed with the findings in the report. CT ABDOMEN WO CONTRAST  Result Date: 10/27/2020 CLINICAL DATA:  Right upper quadrant pain for 3 months. History of appendectomy. EXAM: CT ABDOMEN WITHOUT CONTRAST TECHNIQUE: Multidetector CT imaging of the abdomen was performed following the standard protocol without IV contrast. COMPARISON:  08/21/2020 ultrasound.  Abdominal CT 02/27/2007. FINDINGS: Mild degradation secondary to lack of IV contrast and paucity of abdominal/retroperitoneal fat. Lower chest: Clear lung bases. Normal heart size without pericardial or pleural effusion. Hepatobiliary: Normal liver. Normal gallbladder, without biliary ductal dilatation. Pancreas: Normal, without mass or ductal dilatation. Spleen: Normal in size, without focal abnormality. Adrenals/Urinary Tract: Normal adrenal glands. No renal calculi or hydronephrosis. Stomach/Bowel: Mild apparent gastric antral wall thickening including on 26/2 is favored to be due to underdistention. Normal colon. Normal small bowel. Vascular/Lymphatic: Normal abdominal aortic caliber. Abdominal aortic atherosclerosis. Limited evaluation for  retroperitoneal adenopathy secondary to paucity of fat and lack of IV contrast. Other: Trace perihepatic ascites. Omental thickening and nodularity, including on 29/2 and 39/2. Musculoskeletal: Osteopenia.  Degenerate disc disease at L4-5. IMPRESSION: 1. Degraded exam secondary to lack of IV contrast and paucity of abdominal fat. 2. Peritoneal thickening and nodularity, highly suspicious for carcinomatosis. 3. Trace abdominal ascites. 4. Recommend further evaluation with contrast  enhanced abdominopelvic CT. These results will be called to the ordering clinician or representative by the Radiologist Assistant, and communication documented in the PACS or [employer]. Electronically Signed   By: Abigail Miyamoto M.D.   On: 10/27/2020 07:27   CT PELVIS W CONTRAST  Result Date: 11/15/2020 CLINICAL DATA:  Diagnostic laparoscopy with peritoneal biopsies 721, edema, suspect hematoma EXAM: CT PELVIS WITH CONTRAST TECHNIQUE: Multidetector CT imaging of the pelvis was performed using the standard protocol following the bolus administration of intravenous contrast. CONTRAST:  59mL OMNIPAQUE IOHEXOL 350 MG/ML SOLN COMPARISON:  CT abdomen pelvis, 10/27/2020 FINDINGS: Urinary Tract:  No abnormality visualized. Bowel: Unremarkable visualized pelvic bowel loops. The appendix is surgically absent. Vascular/Lymphatic: No pathologically enlarged lymph nodes. No significant vascular abnormality seen. Reproductive:  No mass or other significant abnormality Other: Small volume pneumoperitoneum and small locules of air within the subcutaneous soft tissues of the abdomen. Soft tissue thickening and nodularity of the partially included omentum (series 2, image 4). Musculoskeletal: No suspicious bone lesions identified. IMPRESSION: 1. Small volume pneumoperitoneum and small locules of air within the subcutaneous soft tissues of the abdomen, in keeping with recent laparoscopy. 2. No evidence of hematoma or other postoperative complication.  3. Soft tissue thickening and nodularity of the partially included omentum, in keeping with reported peritoneal carcinomatosis. Electronically Signed   By: Eddie Candle M.D.   On: 11/15/2020 16:43   CT ABDOMEN PELVIS W CONTRAST  Result Date: 10/27/2020 CLINICAL DATA:  Right upper quadrant pain for 3 months. Creatinine was obtained on site at [place] at [address] Results: Creatinine 0.7 mg/dL. EXAM: CT ABDOMEN AND PELVIS WITH CONTRAST TECHNIQUE: Multidetector CT imaging of the abdomen and pelvis was performed using the standard protocol following bolus administration of intravenous contrast. CONTRAST:  174mL ISOVUE-300 IOPAMIDOL (ISOVUE-300) INJECTION 61% COMPARISON:  Noncontrast CT on 10/26/2020 FINDINGS: Lower Chest: No acute findings. Hepatobiliary: No hepatic masses identified. Gallbladder is unremarkable. No evidence of biliary ductal dilatation. Pancreas:  No mass or inflammatory changes. Spleen: Within normal limits in size and appearance. Adrenals/Urinary Tract: No masses identified. No evidence of ureteral calculi or hydronephrosis. Unremarkable unopacified urinary bladder. Stomach/Bowel: Diverticulosis is seen mainly involving the sigmoid colon, however there is no evidence of diverticulitis. No evidence of bowel obstruction. Vascular/Lymphatic: No pathologically enlarged lymph nodes. No acute vascular findings. Reproductive:  No mass or other significant abnormality. Other: Soft tissue stranding and caking is seen within the omental fat in the mid and left abdomen. Peritoneal thickening is also seen in the right perihepatic space and pelvic cul-de-sac. These findings are suspicious for peritoneal carcinomatosis. No evidence of ascites. Musculoskeletal:  No suspicious bone lesions identified. IMPRESSION: Findings highly suspicious for peritoneal carcinomatosis. No site of primary malignancy identified. Colonic diverticulosis, without radiographic evidence of diverticulitis.  Electronically Signed   By: Marlaine Hind M.D.   On: 10/27/2020 16:43   US PELVIC COMPLETE WITH TRANSVAGINAL  Result Date: 11/08/2020 CLINICAL DATA:  Carcinomatosis, omental mass EXAM: TRANSABDOMINAL AND TRANSVAGINAL ULTRASOUND OF PELVIS TECHNIQUE: Both transabdominal and transvaginal ultrasound examinations of the pelvis were performed. Transabdominal technique was performed for global imaging of the pelvis including uterus, ovaries, adnexal regions, and pelvic cul-de-sac. It was necessary to proceed with endovaginal exam following the transabdominal exam to visualize the uterus, endometrium, ovaries. COMPARISON:  None FINDINGS: Uterus Measurements: 5.1 x 2.7 x 3.3 cm = volume: 24 mL. Anteverted. Scattered calcifications. No mass. Endometrium Thickness: 2 mm.  No endometrial fluid or focal abnormality Right ovary  Measurements: 1.8 x 0.8 x 1.5 cm = volume: 1.1 mL. Tiny simple cyst within RIGHT ovary 7 mm diameter; No followup imaging recommended Note: This recommendation does not apply to premenarchal patients or to those with increased risk (genetic, family history, elevated tumor markers or other high-risk factors) of ovarian cancer. Reference: Radiology 2019 Nov; 293(2):359-371. Left ovary Measurements: 2.6 x 1.8 x 1.8 cm = volume: 4.2 mL. Small cyst within LEFT ovary, bilobed but otherwise simple, 10 x 13 x 9 mm; No followup imaging recommended. Note: This recommendation does not apply to premenarchal patients or to those with increased risk (genetic, family history, elevated tumor markers or other high-risk factors) of ovarian cancer. Reference: Radiology 2019 Nov; 293(2):359-371. Other findings No free pelvic fluid.  No adnexal masses. IMPRESSION: No significant pelvic sonographic abnormalities. Electronically Signed   By: Lavonia Dana M.D.   On: 11/08/2020 11:55

## 2020-11-16 NOTE — Progress Notes (Signed)
START ON PATHWAY REGIMEN - Ovarian     A cycle is every 21 days:     Paclitaxel      Carboplatin   **Always confirm dose/schedule in your pharmacy ordering system**  Patient Characteristics: Preoperative or Nonsurgical Candidate (Clinical Staging), Newly Diagnosed, Neoadjuvant Therapy followed by Surgery BRCA Mutation Status: Awaiting Test Results Therapeutic Status: Preoperative or Nonsurgical Candidate (Clinical Staging) AJCC T Category: cT3 AJCC 8 Stage Grouping: Unknown AJCC N Category: cN0 AJCC M Category: cM0 Therapy Plan: Neoadjuvant Therapy followed by Surgery Intent of Therapy: Curative Intent, Discussed with Patient 

## 2020-11-17 ENCOUNTER — Telehealth: Payer: Self-pay | Admitting: *Deleted

## 2020-11-17 ENCOUNTER — Encounter: Payer: Self-pay | Admitting: Gynecologic Oncology

## 2020-11-17 ENCOUNTER — Inpatient Hospital Stay: Payer: Medicare Other

## 2020-11-17 ENCOUNTER — Other Ambulatory Visit: Payer: Self-pay

## 2020-11-17 NOTE — Progress Notes (Signed)
Met with patient at registration to introduce myself as Arboriculturist and to offer available resources.  Discussed one-time $1000 Radio broadcast assistant to assist with personal expenses while going through treatment. She states she would not qualify.  Gave her my card if interested in applying and for any additional financial questions or concerns.

## 2020-11-18 ENCOUNTER — Telehealth: Payer: Self-pay | Admitting: Hematology and Oncology

## 2020-11-18 ENCOUNTER — Ambulatory Visit: Payer: Medicare Other | Admitting: Gynecologic Oncology

## 2020-11-18 NOTE — Telephone Encounter (Signed)
Scheduled appts per 7/27 sch msg. Called pt, no answer. Left msg with appts dates and times.

## 2020-11-19 NOTE — Progress Notes (Signed)
Pharmacist Chemotherapy Monitoring - Initial Assessment    Anticipated start date: 11/26/20   The following has been reviewed per standard work regarding the patient's treatment regimen: The patient's diagnosis, treatment plan and drug doses, and organ/hematologic function Lab orders and baseline tests specific to treatment regimen  The treatment plan start date, drug sequencing, and pre-medications Prior authorization status  Patient's documented medication list, including drug-drug interaction screen and prescriptions for anti-emetics and supportive care specific to the treatment regimen The drug concentrations, fluid compatibility, administration routes, and timing of the medications to be used The patient's access for treatment and lifetime cumulative dose history, if applicable  The patient's medication allergies and previous infusion related reactions, if applicable   Changes made to treatment plan:  treatment plan date  Follow up needed:  N/A   Wynona Neat, [facility], 11/19/2020  7:59 AM

## 2020-11-25 ENCOUNTER — Ambulatory Visit: Payer: Medicare Other | Admitting: Gynecologic Oncology

## 2020-11-26 ENCOUNTER — Inpatient Hospital Stay: Payer: Medicare Other | Attending: Gynecologic Oncology

## 2020-11-26 ENCOUNTER — Other Ambulatory Visit: Payer: Self-pay | Admitting: Hematology and Oncology

## 2020-11-26 ENCOUNTER — Other Ambulatory Visit: Payer: Self-pay

## 2020-11-26 ENCOUNTER — Inpatient Hospital Stay: Payer: Medicare Other

## 2020-11-26 VITALS — BP 131/68 | HR 77 | Temp 98.3°F | Resp 18 | Ht 64.0 in | Wt 114.0 lb

## 2020-11-26 DIAGNOSIS — Z79899 Other long term (current) drug therapy: Secondary | ICD-10-CM | POA: Insufficient documentation

## 2020-11-26 DIAGNOSIS — C482 Malignant neoplasm of peritoneum, unspecified: Secondary | ICD-10-CM

## 2020-11-26 DIAGNOSIS — R739 Hyperglycemia, unspecified: Secondary | ICD-10-CM | POA: Diagnosis not present

## 2020-11-26 DIAGNOSIS — T380X5A Adverse effect of glucocorticoids and synthetic analogues, initial encounter: Secondary | ICD-10-CM | POA: Insufficient documentation

## 2020-11-26 DIAGNOSIS — M810 Age-related osteoporosis without current pathological fracture: Secondary | ICD-10-CM | POA: Insufficient documentation

## 2020-11-26 DIAGNOSIS — R971 Elevated cancer antigen 125 [CA 125]: Secondary | ICD-10-CM | POA: Insufficient documentation

## 2020-11-26 DIAGNOSIS — Z5111 Encounter for antineoplastic chemotherapy: Secondary | ICD-10-CM | POA: Diagnosis present

## 2020-11-26 DIAGNOSIS — C786 Secondary malignant neoplasm of retroperitoneum and peritoneum: Secondary | ICD-10-CM | POA: Diagnosis present

## 2020-11-26 DIAGNOSIS — C579 Malignant neoplasm of female genital organ, unspecified: Secondary | ICD-10-CM | POA: Diagnosis present

## 2020-11-26 LAB — CBC WITH DIFFERENTIAL (CANCER CENTER ONLY)
Abs Immature Granulocytes: 0.08 10*3/uL — ABNORMAL HIGH (ref 0.00–0.07)
Basophils Absolute: 0 10*3/uL (ref 0.0–0.1)
Basophils Relative: 0 %
Eosinophils Absolute: 0 10*3/uL (ref 0.0–0.5)
Eosinophils Relative: 0 %
HCT: 39.1 % (ref 36.0–46.0)
Hemoglobin: 12.8 g/dL (ref 12.0–15.0)
Immature Granulocytes: 1 %
Lymphocytes Relative: 4 %
Lymphs Abs: 0.6 10*3/uL — ABNORMAL LOW (ref 0.7–4.0)
MCH: 29.3 pg (ref 26.0–34.0)
MCHC: 32.7 g/dL (ref 30.0–36.0)
MCV: 89.5 fL (ref 80.0–100.0)
Monocytes Absolute: 0.2 10*3/uL (ref 0.1–1.0)
Monocytes Relative: 2 %
Neutro Abs: 13.4 10*3/uL — ABNORMAL HIGH (ref 1.7–7.7)
Neutrophils Relative %: 93 %
Platelet Count: 191 10*3/uL (ref 150–400)
RBC: 4.37 MIL/uL (ref 3.87–5.11)
RDW: 12.6 % (ref 11.5–15.5)
WBC Count: 14.3 10*3/uL — ABNORMAL HIGH (ref 4.0–10.5)
nRBC: 0 % (ref 0.0–0.2)

## 2020-11-26 LAB — CMP (CANCER CENTER ONLY)
ALT: 14 U/L (ref 0–44)
AST: 16 U/L (ref 15–41)
Albumin: 4 g/dL (ref 3.5–5.0)
Alkaline Phosphatase: 48 U/L (ref 38–126)
Anion gap: 9 (ref 5–15)
BUN: 17 mg/dL (ref 8–23)
CO2: 25 mmol/L (ref 22–32)
Calcium: 9.6 mg/dL (ref 8.9–10.3)
Chloride: 104 mmol/L (ref 98–111)
Creatinine: 0.79 mg/dL (ref 0.44–1.00)
GFR, Estimated: >60 mL/min (ref >60)
Glucose, Bld: 144 mg/dL — ABNORMAL HIGH (ref 70–99)
Potassium: 4.1 mmol/L (ref 3.5–5.1)
Sodium: 138 mmol/L (ref 135–145)
Total Bilirubin: 0.5 mg/dL (ref 0.3–1.2)
Total Protein: 7.3 g/dL (ref 6.5–8.1)

## 2020-11-26 MED ORDER — FAMOTIDINE 20 MG IN NS 100 ML IVPB
20.0000 mg | Freq: Once | INTRAVENOUS | Status: AC
  Administered 2020-11-26: 20 mg via INTRAVENOUS

## 2020-11-26 MED ORDER — SODIUM CHLORIDE 0.9 % IV SOLN
Freq: Once | INTRAVENOUS | Status: AC
  Filled 2020-11-26: qty 250

## 2020-11-26 MED ORDER — PALONOSETRON HCL INJECTION 0.25 MG/5ML
INTRAVENOUS | Status: AC
  Filled 2020-11-26: qty 5

## 2020-11-26 MED ORDER — FAMOTIDINE 20 MG IN NS 100 ML IVPB
INTRAVENOUS | Status: AC
  Filled 2020-11-26: qty 100

## 2020-11-26 MED ORDER — DIPHENHYDRAMINE HCL 50 MG/ML IJ SOLN
INTRAMUSCULAR | Status: AC
  Filled 2020-11-26: qty 1

## 2020-11-26 MED ORDER — SODIUM CHLORIDE 0.9 % IV SOLN
406.2000 mg | Freq: Once | INTRAVENOUS | Status: AC
  Administered 2020-11-26: 410 mg via INTRAVENOUS
  Filled 2020-11-26: qty 41

## 2020-11-26 MED ORDER — DIPHENHYDRAMINE HCL 50 MG/ML IJ SOLN
25.0000 mg | Freq: Once | INTRAMUSCULAR | Status: AC
  Administered 2020-11-26: 25 mg via INTRAVENOUS

## 2020-11-26 MED ORDER — PALONOSETRON HCL INJECTION 0.25 MG/5ML
0.2500 mg | Freq: Once | INTRAVENOUS | Status: AC
  Administered 2020-11-26: 0.25 mg via INTRAVENOUS

## 2020-11-26 MED ORDER — SODIUM CHLORIDE 0.9 % IV SOLN
10.0000 mg | Freq: Once | INTRAVENOUS | Status: AC
  Administered 2020-11-26: 10 mg via INTRAVENOUS
  Filled 2020-11-26: qty 10

## 2020-11-26 MED ORDER — SODIUM CHLORIDE 0.9 % IV SOLN
150.0000 mg | Freq: Once | INTRAVENOUS | Status: AC
  Administered 2020-11-26: 150 mg via INTRAVENOUS
  Filled 2020-11-26: qty 150

## 2020-11-26 MED ORDER — SODIUM CHLORIDE 0.9 % IV SOLN
175.0000 mg/m2 | Freq: Once | INTRAVENOUS | Status: AC
  Administered 2020-11-26: 264 mg via INTRAVENOUS
  Filled 2020-11-26: qty 44

## 2020-11-26 NOTE — Patient Instructions (Addendum)
[facility]  Discharge Instructions: Thank you for choosing [facility] to provide your oncology and hematology care.   If you have a lab appointment with the [facility], please go directly to the [facility] and check in at the registration area.   Wear comfortable clothing and clothing appropriate for easy access to any Portacath or PICC line.   We strive to give you quality time with your provider. You may need to reschedule your appointment if you arrive late (15 or more minutes).  Arriving late affects you and other patients whose appointments are after yours.  Also, if you miss three or more appointments without notifying the office, you may be dismissed from the clinic at the provider's discretion.      For prescription refill requests, have your pharmacy contact our office and allow 72 hours for refills to be completed.    Today you received the following chemotherapy and/or immunotherapy agents Taxol and Carboplatin       To help prevent nausea and vomiting after your treatment, we encourage you to take your nausea medication as directed.  BELOW ARE SYMPTOMS THAT SHOULD BE REPORTED IMMEDIATELY: *FEVER GREATER THAN 100.4 F (38 C) OR HIGHER *CHILLS OR SWEATING *NAUSEA AND VOMITING THAT IS NOT CONTROLLED WITH YOUR NAUSEA MEDICATION *UNUSUAL SHORTNESS OF BREATH *UNUSUAL BRUISING OR BLEEDING *URINARY PROBLEMS (pain or burning when urinating, or frequent urination) *BOWEL PROBLEMS (unusual diarrhea, constipation, pain near the anus) TENDERNESS IN MOUTH AND THROAT WITH OR WITHOUT PRESENCE OF ULCERS (sore throat, sores in mouth, or a toothache) UNUSUAL RASH, SWELLING OR PAIN  UNUSUAL VAGINAL DISCHARGE OR ITCHING   Items with * indicate a potential emergency and should be followed up as soon as possible or go to the Emergency Department if any problems should occur.  Please show the CHEMOTHERAPY ALERT CARD or IMMUNOTHERAPY ALERT CARD at  check-in to the Emergency Department and triage nurse.  Should you have questions after your visit or need to cancel or reschedule your appointment, please contact [facility]  Dept: [phone]  and follow the prompts.  Office hours are 8:00 a.m. to 4:30 p.m. Monday - Friday. Please note that voicemails left after 4:00 p.m. may not be returned until the following business day.  We are closed weekends and major holidays. You have access to a nurse at all times for urgent questions. Please call the main number to the clinic Dept: [phone] and follow the prompts.   For any non-urgent questions, you may also contact your provider using MyChart. We now offer e-Visits for anyone 43 and older to request care online for non-urgent symptoms. For details visit [email or web address].   Also download the MyChart app! Go to the app store, search "MyChart", open the app, select [facility], and log in with your MyChart username and password.  Due to Covid, a mask is required upon entering the hospital/clinic. If you do not have a mask, one will be given to you upon arrival. For doctor visits, patients may have 1 support person aged 84 or older with them. For treatment visits, patients cannot have anyone with them due to current Covid guidelines and our immunocompromised population.   Paclitaxel injection What is this medication? PACLITAXEL (PAK li TAX el) is a chemotherapy drug. It targets fast dividing cells, like cancer cells, and causes these cells to die. This medicine is used to treat ovarian cancer, breast cancer, lung cancer, Kaposi's sarcoma, andother cancers. This  medicine may be used for other purposes; ask your health care provider orpharmacist if you have questions. COMMON BRAND NAME(S): Onxol, Taxol What should I tell my care team before I take this medication? They need to know if you have any of these conditions: history of irregular heartbeat liver  disease low blood counts, like low white cell, platelet, or red cell counts lung or breathing disease, like asthma tingling of the fingers or toes, or other nerve disorder an unusual or allergic reaction to paclitaxel, alcohol, polyoxyethylated castor oil, other chemotherapy, other medicines, foods, dyes, or preservatives pregnant or trying to get pregnant breast-feeding How should I use this medication? This drug is given as an infusion into a vein. It is administered in a hospitalor clinic by a specially trained health care professional. Talk to your pediatrician regarding the use of this medicine in children.Special care may be needed. Overdosage: If you think you have taken too much of this medicine contact apoison control center or emergency room at once. NOTE: This medicine is only for you. Do not share this medicine with others. What if I miss a dose? It is important not to miss your dose. Call your doctor or health careprofessional if you are unable to keep an appointment. What may interact with this medication? Do not take this medicine with any of the following medications: live virus vaccines This medicine may also interact with the following medications: antiviral medicines for hepatitis, HIV or AIDS certain antibiotics like erythromycin and clarithromycin certain medicines for fungal infections like ketoconazole and itraconazole certain medicines for seizures like carbamazepine, phenobarbital, phenytoin gemfibrozil nefazodone rifampin St. John's wort This list may not describe all possible interactions. Give your health care provider a list of all the medicines, herbs, non-prescription drugs, or dietary supplements you use. Also tell them if you smoke, drink alcohol, or use illegaldrugs. Some items may interact with your medicine. What should I watch for while using this medication? Your condition will be monitored carefully while you are receiving this medicine. You will  need important blood work done while you are taking thismedicine. This medicine can cause serious allergic reactions. To reduce your risk you will need to take other medicine(s) before treatment with this medicine. If you experience allergic reactions like skin rash, itching or hives, swelling of theface, lips, or tongue, tell your doctor or health care professional right away. In some cases, you may be given additional medicines to help with side effects.Follow all directions for their use. This drug may make you feel generally unwell. This is not uncommon, as chemotherapy can affect healthy cells as well as cancer cells. Report any side effects. Continue your course of treatment even though you feel ill unless yourdoctor tells you to stop. Call your doctor or health care professional for advice if you get a fever, chills or sore throat, or other symptoms of a cold or flu. Do not treat yourself. This drug decreases your body's ability to fight infections. Try toavoid being around people who are sick. This medicine may increase your risk to bruise or bleed. Call your doctor orhealth care professional if you notice any unusual bleeding. Be careful brushing and flossing your teeth or using a toothpick because you may get an infection or bleed more easily. If you have any dental work done,tell your dentist you are receiving this medicine. Avoid taking products that contain aspirin, acetaminophen, ibuprofen, naproxen, or ketoprofen unless instructed by your doctor. These medicines may hide afever. Do not become pregnant while  taking this medicine. Women should inform their doctor if they wish to become pregnant or think they might be pregnant. There is a potential for serious side effects to an unborn child. Talk to your health care professional or pharmacist for more information. Do not breast-feed aninfant while taking this medicine. Men are advised not to father a child while receiving this medicine. This  product may contain alcohol. Ask your pharmacist or healthcare provider if this medicine contains alcohol. Be sure to tell all healthcare providers you are taking this medicine. Certain medicines, like metronidazole and disulfiram, can cause an unpleasant reaction when taken with alcohol. The reaction includes flushing, headache, nausea, vomiting, sweating, and increased thirst. Thereaction can last from 30 minutes to several hours. What side effects may I notice from receiving this medication? Side effects that you should report to your doctor or health care professionalas soon as possible: allergic reactions like skin rash, itching or hives, swelling of the face, lips, or tongue breathing problems changes in vision fast, irregular heartbeat high or low blood pressure mouth sores pain, tingling, numbness in the hands or feet signs of decreased platelets or bleeding - bruising, pinpoint red spots on the skin, black, tarry stools, blood in the urine signs of decreased red blood cells - unusually weak or tired, feeling faint or lightheaded, falls signs of infection - fever or chills, cough, sore throat, pain or difficulty passing urine signs and symptoms of liver injury like dark yellow or brown urine; general ill feeling or flu-like symptoms; light-colored stools; loss of appetite; nausea; right upper belly pain; unusually weak or tired; yellowing of the eyes or skin swelling of the ankles, feet, hands unusually slow heartbeat Side effects that usually do not require medical attention (report to yourdoctor or health care professional if they continue or are bothersome): diarrhea hair loss loss of appetite muscle or joint pain nausea, vomiting pain, redness, or irritation at site where injected tiredness This list may not describe all possible side effects. Call your doctor for medical advice about side effects. You may report side effects to FDA at1-800-FDA-1088. Where should I keep my  medication? This drug is given in a hospital or clinic and will not be stored at home. NOTE: This sheet is a summary. It may not cover all possible information. If you have questions about this medicine, talk to your doctor, pharmacist, orhealth care provider.  2022 Elsevier/Gold Standard (2019-03-12 13:37:23)  Carboplatin injection What is this medication? CARBOPLATIN (KAR boe pla tin) is a chemotherapy drug. It targets fast dividing cells, like cancer cells, and causes these cells to die. This medicine is usedto treat ovarian cancer and many other cancers. This medicine may be used for other purposes; ask your health care provider orpharmacist if you have questions. COMMON BRAND NAME(S): Paraplatin What should I tell my care team before I take this medication? They need to know if you have any of these conditions: blood disorders hearing problems kidney disease recent or ongoing radiation therapy an unusual or allergic reaction to carboplatin, cisplatin, other chemotherapy, other medicines, foods, dyes, or preservatives pregnant or trying to get pregnant breast-feeding How should I use this medication? This drug is usually given as an infusion into a vein. It is administered in [facility] or clinic by a specially trained health care professional. Talk to your pediatrician regarding the use of this medicine in children.Special care may be needed. Overdosage: If you think you have taken too much of this medicine contact apoison control center or  emergency room at once. NOTE: This medicine is only for you. Do not share this medicine with others. What if I miss a dose? It is important not to miss a dose. Call your doctor or health careprofessional if you are unable to keep an appointment. What may interact with this medication? medicines for seizures medicines to increase blood counts like filgrastim, pegfilgrastim, sargramostim some antibiotics like amikacin, gentamicin, neomycin,  streptomycin, tobramycin vaccines Talk to your doctor or health care professional before taking any of thesemedicines: acetaminophen aspirin ibuprofen ketoprofen naproxen This list may not describe all possible interactions. Give your health care provider a list of all the medicines, herbs, non-prescription drugs, or dietary supplements you use. Also tell them if you smoke, drink alcohol, or use illegaldrugs. Some items may interact with your medicine. What should I watch for while using this medication? Your condition will be monitored carefully while you are receiving this medicine. You will need important blood work done while you are taking thismedicine. This drug may make you feel generally unwell. This is not uncommon, as chemotherapy can affect healthy cells as well as cancer cells. Report any side effects. Continue your course of treatment even though you feel ill unless yourdoctor tells you to stop. In some cases, you may be given additional medicines to help with side effects.Follow all directions for their use. Call your doctor or health care professional for advice if you get a fever, chills or sore throat, or other symptoms of a cold or flu. Do not treat yourself. This drug decreases your body's ability to fight infections. Try toavoid being around people who are sick. This medicine may increase your risk to bruise or bleed. Call your doctor orhealth care professional if you notice any unusual bleeding. Be careful brushing and flossing your teeth or using a toothpick because you may get an infection or bleed more easily. If you have any dental work done,tell your dentist you are receiving this medicine. Avoid taking products that contain aspirin, acetaminophen, ibuprofen, naproxen, or ketoprofen unless instructed by your doctor. These medicines may hide afever. Do not become pregnant while taking this medicine. Women should inform their doctor if they wish to become pregnant or think  they might be pregnant. There is a potential for serious side effects to an unborn child. Talk to your health care professional or pharmacist for more information. Do not breast-feed aninfant while taking this medicine. What side effects may I notice from receiving this medication? Side effects that you should report to your doctor or health care professionalas soon as possible: allergic reactions like skin rash, itching or hives, swelling of the face, lips, or tongue signs of infection - fever or chills, cough, sore throat, pain or difficulty passing urine signs of decreased platelets or bleeding - bruising, pinpoint red spots on the skin, black, tarry stools, nosebleeds signs of decreased red blood cells - unusually weak or tired, fainting spells, lightheadedness breathing problems changes in hearing changes in vision chest pain high blood pressure low blood counts - This drug may decrease the number of white blood cells, red blood cells and platelets. You may be at increased risk for infections and bleeding. nausea and vomiting pain, swelling, redness or irritation at the injection site pain, tingling, numbness in the hands or feet problems with balance, talking, walking trouble passing urine or change in the amount of urine Side effects that usually do not require medical attention (report to yourdoctor or health care professional if they continue or are  bothersome): hair loss loss of appetite metallic taste in the mouth or changes in taste This list may not describe all possible side effects. Call your doctor for medical advice about side effects. You may report side effects to FDA at1-800-FDA-1088. Where should I keep my medication? This drug is given in a hospital or clinic and will not be stored at home. NOTE: This sheet is a summary. It may not cover all possible information. If you have questions about this medicine, talk to your doctor, pharmacist, orhealth care provider.  2022  Elsevier/Gold Standard (2007-07-16 14:38:05)

## 2020-11-27 LAB — CA 125: Cancer Antigen (CA) 125: 76.8 U/mL — ABNORMAL HIGH (ref 0.0–38.1)

## 2020-11-29 ENCOUNTER — Telehealth: Payer: Self-pay | Admitting: *Deleted

## 2020-12-03 ENCOUNTER — Encounter: Payer: Self-pay | Admitting: Gynecologic Oncology

## 2020-12-03 ENCOUNTER — Other Ambulatory Visit: Payer: Self-pay

## 2020-12-03 ENCOUNTER — Inpatient Hospital Stay (HOSPITAL_BASED_OUTPATIENT_CLINIC_OR_DEPARTMENT_OTHER): Payer: Medicare Other | Admitting: Gynecologic Oncology

## 2020-12-03 VITALS — BP 130/61 | HR 80 | Temp 97.0°F | Resp 18 | Wt 112.2 lb

## 2020-12-03 DIAGNOSIS — Z7189 Other specified counseling: Secondary | ICD-10-CM

## 2020-12-03 DIAGNOSIS — C482 Malignant neoplasm of peritoneum, unspecified: Secondary | ICD-10-CM

## 2020-12-03 DIAGNOSIS — Z9889 Other specified postprocedural states: Secondary | ICD-10-CM

## 2020-12-03 DIAGNOSIS — C800 Disseminated malignant neoplasm, unspecified: Secondary | ICD-10-CM

## 2020-12-03 NOTE — Patient Instructions (Signed)
You are healing great from surgery!  Remember no heavy lifting (no more than 10 pounds) until 6 weeks after surgery.  I will see you back after cycle 3 and your CT scan.

## 2020-12-03 NOTE — Progress Notes (Signed)
Gynecologic Oncology Return Clinic Visit  12/03/20  Reason for Visit: follow-up after surgery, discussion of treatment plan  Treatment History: Oncology History  Primary peritoneal carcinomatosis ([place])  08/21/2020 Initial Diagnosis   The patient reports intermittent right upper quadrant pain that feel like she pulled a muscle beginning in March 2022.  In April, she woke up with sharp pain in her right upper quadrant.  This was her first episode of sharp pain.  She thought that her pain was related to her gallbladder and presented to the emergency department.  She was seen in ED on 4/30. RUQ ultrasound and x-ray were normal as were LFTs and lipase.    10/27/2020 Imaging   Findings highly suspicious for peritoneal carcinomatosis. No site of primary malignancy identified.   Colonic diverticulosis, without radiographic evidence of diverticulitis   11/02/2020 Initial Diagnosis   Primary peritoneal carcinomatosis ([place])   11/02/2020 Tumor Marker   Patient's tumor was tested for the following markers: CA-125. Results of the tumor marker test revealed 88.   11/11/2020 Pathology Results   FINAL MICROSCOPIC DIAGNOSIS:   A. CUL DE SAC, LEFT ANTERIOR, EXCISION:  -  Poorly differentiated carcinoma  -  See comment   B. SIDEWALL, RIGHT, BIOPSY:  -  Poorly differentiated carcinoma  -  See comment   COMMENT:   By immunohistochemistry, the neoplastic cells are positive for cytokeratin 7, p53, PAX8 and WT1 but negative for cytokeratin 20, GATA3, CDX2 and TTF-1.  Overall, the immunophenotype is consistent with a  gynecologic primary.    11/11/2020 Pathology Results   FINAL MICROSCOPIC DIAGNOSIS:  - Malignant cells consistent with metastatic adenocarcinoma    11/11/2020 Surgery   Pre-operative Diagnosis: Carcinomatosis, mildly elevated CA-125   Post-operative Diagnosis: same, At least stage IIIC gyn malignancy   Operation: Diagnostic laparoscopy, peritoneal biopsies    Surgeon: Jeral Pinch  MD Operative Findings: On EUA, small mobile uterus. On intra-abdominal entry, carcinomatosis appreciated studding the right anterior diaphragm, bilateral liver surfaces, mesentery, pelvic peritoneum. Omental cake with measuring up to 2x3cm. Right ovary adherent to the right uterus with peritoneal studding adjacent. Frondular implants noted along right pelvic sidewall. Miliary disease along anterior cul de sac. Cul de sac covered with friable miliary disease. Small volume dark amber ascites. Specimens: left anterior cul de sac peritoneum, right pelvic sidewall peritoneal biopsy          11/15/2020 Cancer Staging   Staging form: Ovary, Fallopian Tube, and Primary Peritoneal Carcinoma, AJCC 8th Edition - Clinical stage from 11/15/2020: FIGO Stage III (cT3, cN0, cM0) - Signed by [name], MD on 11/15/2020 Stage prefix: Initial diagnosis   11/26/2020 -  Chemotherapy    Patient is on Treatment Plan: OVARIAN CARBOPLATIN (AUC 6) / PACLITAXEL (175) Q21D X 6 CYCLES       11/26/2020 Tumor Marker   Patient's tumor was tested for the following markers: CA-125. Results of the tumor marker test revealed 76.8.     Interval History: The patient presents today for surgical follow-up.  Since surgery, she had her first cycle of carboplatin and paclitaxel on 8/5.  She has done very well after.  She had some knee pain over the weekend that resolved with Tylenol.  She reports a good appetite without nausea or emesis.  She reports normal bowel and bladder function.  She is continuing to walk 2-2.5 miles a day.  She denies any significant pain associated with her incisions.  She denies any fevers or chills.  Past Medical/Surgical History: Past Medical History:  Diagnosis Date   Anemia    Cancer ([place])    Osteoporosis    Polyp of colon, unspecified part of colon, unspecified type    Benign    Past Surgical History:  Procedure Laterality Date   APPENDECTOMY  02/27/2007   [facility]   BILATERAL SALPINGECTOMY Right  03/1978   right ectopic, tied other tube   CATARACT EXTRACTION Right 10/31/2011   Dr. Katy Fitch   COLONOSCOPY  03/2019   Dr. Alessandra Bevels   COLONOSCOPY WITH PROPOFOL N/A 04/01/2018   Procedure: COLONOSCOPY WITH PROPOFOL;  Surgeon: Otis Brace, MD;  Location: [facility];  Service: Gastroenterology;  Laterality: N/A;   DILATION AND CURETTAGE OF UTERUS     x3, 2 for miscarriages, one for polyp vs fibroid   HAND / FINGER LESION EXCISION Left    lt. index finger   LAPAROSCOPY N/A 11/11/2020   Procedure: LAPAROSCOPY DIAGNOSTIC WITH PERITONEAL BIOPSY;  Surgeon: [name], MD;  Location: [facility];  Service: Gynecology;  Laterality: N/A;   POLYPECTOMY  04/01/2018   Procedure: POLYPECTOMY;  Surgeon: Otis Brace, MD;  Location: [facility];  Service: Gastroenterology;;   RETINAL TEAR REPAIR CRYOTHERAPY Right 04/25/2011   Dr. Dominic Pea INJECTION  04/01/2018   Procedure: SUBMUCOSAL INJECTION;  Surgeon: Otis Brace, MD;  Location: [facility];  Service: Gastroenterology;;   TUBAL LIGATION Left 03/1978    Family History  Problem Relation Age of Onset   Lung cancer Mother    Skin cancer Father    Breast cancer Daughter    Ovarian cancer Neg Hx    Endometrial cancer Neg Hx    Prostate cancer Neg Hx    Pancreatic cancer Neg Hx    Colon cancer Neg Hx     Social History   Socioeconomic History   Marital status: Legally Separated    Spouse name: Not on file   Number of children: Not on file   Years of education: Not on file   Highest education level: Not on file  Occupational History   Not on file  Tobacco Use   Smoking status: Never    Passive exposure: Past (18 years)   Smokeless tobacco: Never  Vaping Use   Vaping Use: Never used  Substance and Sexual Activity   Alcohol use: Yes    Comment: Very occaionally   Drug use: Never   Sexual activity: Not Currently  Other Topics Concern   Not on file  Social History Narrative   Not on file   Social  Determinants of Health   Financial Resource Strain: Not on file  Food Insecurity: Not on file  Transportation Needs: Not on file  Physical Activity: Not on file  Stress: Not on file  Social Connections: Not on file    Current Medications:  Current Outpatient Medications:    Biotin 5000 MCG TABS, Take 5,000 mcg by mouth daily., Disp: , Rfl:    Calcium Carbonate-Vitamin D 600-400 MG-UNIT tablet, Take 1 tablet by mouth daily., Disp: , Rfl:    Cholecalciferol 50 MCG (2000 UT) CAPS, Take 2,000 Units by mouth daily., Disp: , Rfl:    dexamethasone (DECADRON) 4 MG tablet, Take 2 tabs at the night before and 2 tab the morning of chemotherapy, every 3 weeks, by mouth x 6 cycles, Disp: 36 tablet, Rfl: 6   Multiple Vitamin (MULTIVITAMIN WITH MINERALS) TABS tablet, Take 1 tablet by mouth daily. WITH BIOTIN, Disp: , Rfl:    ondansetron (ZOFRAN) 8 MG tablet, Take 1 tablet (8  mg total) by mouth every 8 (eight) hours as needed for refractory nausea / vomiting., Disp: 30 tablet, Rfl: 1   prochlorperazine (COMPAZINE) 10 MG tablet, Take 1 tablet (10 mg total) by mouth every 6 (six) hours as needed (Nausea or vomiting)., Disp: 30 tablet, Rfl: 1   risedronate (ACTONEL) 150 MG tablet, Take 150 mg by mouth every 30 (thirty) days., Disp: , Rfl:    senna-docusate (SENOKOT-S) 8.6-50 MG tablet, Take 2 tablets by mouth at bedtime. For AFTER surgery, do not take if having diarrhea, Disp: 30 tablet, Rfl: 0   traMADol (ULTRAM) 50 MG tablet, Take 1 tablet (50 mg total) by mouth every 6 (six) hours as needed for severe pain. For AFTER surgery only, do not take and drive, Disp: 10 tablet, Rfl: 0   tretinoin (RETIN-A) 0.025 % cream, Apply 1 application topically at bedtime as needed (blemishes)., Disp: , Rfl:   Review of Systems: Denies appetite changes, fevers, chills, fatigue, unexplained weight changes. Denies hearing loss, neck lumps or masses, mouth sores, ringing in ears or voice changes. Denies cough or wheezing.   Denies shortness of breath. Denies chest pain or palpitations. Denies leg swelling. Denies abdominal distention, pain, blood in stools, constipation, diarrhea, nausea, vomiting, or early satiety. Denies pain with intercourse, dysuria, frequency, hematuria or incontinence. Denies hot flashes, pelvic pain, vaginal bleeding or vaginal discharge.   Denies joint pain, back pain or muscle pain/cramps. Denies itching, rash, or wounds. Denies dizziness, headaches, numbness or seizures. Denies swollen lymph nodes or glands, denies easy bruising or bleeding. Denies anxiety, depression, confusion, or decreased concentration.  Physical Exam: BP 130/61 (BP Location: Right Arm, Patient Position: Sitting)   Pulse 80   Temp (!) 97 F (36.1 C) (Tympanic)   Resp 18   Wt 112 lb 3.2 oz (50.9 kg)   SpO2 100%   BMI 19.26 kg/m  General: Alert, oriented, no acute distress. HEENT: Normocephalic, atraumatic, sclera anicteric. Chest: Unlabored breathing on room air. Abdomen: soft, nontender.  Normoactive bowel sounds.  No masses or hepatosplenomegaly appreciated.  Well-healed laparoscopic incisions.  Remaining Dermabond removed as well as several mattress sutures. Extremities: Grossly normal range of motion.  Warm, well perfused.  No edema bilaterally.  Laboratory & Radiologic Studies: Component Ref Range & Units 7 d ago 1 mo ago  Cancer Antigen (CA) 125 0.0 - 38.1 U/mL 76.8 High   88.2 High      Assessment & Plan: Robin Sellers is a 70 y.o. woman with Stage IIIC gyn malignancy, presumed primary peritoneal, who presents for post-operative follow-up and treatment discussion.  The patient is doing extremely well from a postoperative standpoint.  We discussed continued restrictions as well as postoperative expectations.  She has started neoadjuvant treatment and is doing very well.  She is aware of possible side effects she may experience.  She understands that the plan will be for 3 cycles of  chemotherapy followed by CT scan to evaluate for treatment response.  Depending on CT findings as well as her CA-125, we will decide if she is a candidate for interval debulking surgery after these 3 cycles.  If she is not, then we will continue with another 3 cycles with a second CT scan to reevaluate for possible surgery.  22 minutes of total time was spent for this patient encounter, including preparation, face-to-face counseling with the patient and coordination of care, and documentation of the encounter.  Jeral Pinch, MD  Division of Gynecologic Oncology  Department of Obstetrics and Gynecology  [employer]

## 2020-12-08 ENCOUNTER — Encounter: Payer: Medicare Other | Admitting: Gynecologic Oncology

## 2020-12-16 MED FILL — Dexamethasone Sodium Phosphate Inj 100 MG/10ML: INTRAMUSCULAR | Qty: 1 | Status: AC

## 2020-12-16 MED FILL — Fosaprepitant Dimeglumine For IV Infusion 150 MG (Base Eq): INTRAVENOUS | Qty: 5 | Status: AC

## 2020-12-17 ENCOUNTER — Inpatient Hospital Stay: Payer: Medicare Other

## 2020-12-17 ENCOUNTER — Inpatient Hospital Stay (HOSPITAL_BASED_OUTPATIENT_CLINIC_OR_DEPARTMENT_OTHER): Payer: Medicare Other | Admitting: Hematology and Oncology

## 2020-12-17 ENCOUNTER — Other Ambulatory Visit: Payer: Self-pay

## 2020-12-17 ENCOUNTER — Encounter: Payer: Self-pay | Admitting: Hematology and Oncology

## 2020-12-17 DIAGNOSIS — Z5111 Encounter for antineoplastic chemotherapy: Secondary | ICD-10-CM | POA: Diagnosis not present

## 2020-12-17 DIAGNOSIS — T50905A Adverse effect of unspecified drugs, medicaments and biological substances, initial encounter: Secondary | ICD-10-CM

## 2020-12-17 DIAGNOSIS — C482 Malignant neoplasm of peritoneum, unspecified: Secondary | ICD-10-CM | POA: Diagnosis not present

## 2020-12-17 DIAGNOSIS — R739 Hyperglycemia, unspecified: Secondary | ICD-10-CM

## 2020-12-17 LAB — CBC WITH DIFFERENTIAL (CANCER CENTER ONLY)
Abs Immature Granulocytes: 0.04 10*3/uL (ref 0.00–0.07)
Basophils Absolute: 0 10*3/uL (ref 0.0–0.1)
Basophils Relative: 0 %
Eosinophils Absolute: 0 10*3/uL (ref 0.0–0.5)
Eosinophils Relative: 0 %
HCT: 37.1 % (ref 36.0–46.0)
Hemoglobin: 12.8 g/dL (ref 12.0–15.0)
Immature Granulocytes: 0 %
Lymphocytes Relative: 10 %
Lymphs Abs: 1.1 10*3/uL (ref 0.7–4.0)
MCH: 29.4 pg (ref 26.0–34.0)
MCHC: 34.5 g/dL (ref 30.0–36.0)
MCV: 85.3 fL (ref 80.0–100.0)
Monocytes Absolute: 0.3 10*3/uL (ref 0.1–1.0)
Monocytes Relative: 3 %
Neutro Abs: 9.1 10*3/uL — ABNORMAL HIGH (ref 1.7–7.7)
Neutrophils Relative %: 87 %
Platelet Count: 190 10*3/uL (ref 150–400)
RBC: 4.35 MIL/uL (ref 3.87–5.11)
RDW: 12.4 % (ref 11.5–15.5)
WBC Count: 10.5 10*3/uL (ref 4.0–10.5)
nRBC: 0 % (ref 0.0–0.2)

## 2020-12-17 LAB — CMP (CANCER CENTER ONLY)
ALT: 20 U/L (ref 0–44)
AST: 18 U/L (ref 15–41)
Albumin: 4.2 g/dL (ref 3.5–5.0)
Alkaline Phosphatase: 48 U/L (ref 38–126)
Anion gap: 10 (ref 5–15)
BUN: 17 mg/dL (ref 8–23)
CO2: 24 mmol/L (ref 22–32)
Calcium: 9.5 mg/dL (ref 8.9–10.3)
Chloride: 103 mmol/L (ref 98–111)
Creatinine: 0.91 mg/dL (ref 0.44–1.00)
GFR, Estimated: >60 mL/min (ref >60)
Glucose, Bld: 162 mg/dL — ABNORMAL HIGH (ref 70–99)
Potassium: 4 mmol/L (ref 3.5–5.1)
Sodium: 137 mmol/L (ref 135–145)
Total Bilirubin: 0.4 mg/dL (ref 0.3–1.2)
Total Protein: 7.5 g/dL (ref 6.5–8.1)

## 2020-12-17 MED ORDER — DIPHENHYDRAMINE HCL 50 MG/ML IJ SOLN
25.0000 mg | Freq: Once | INTRAMUSCULAR | Status: AC
  Administered 2020-12-17: 25 mg via INTRAVENOUS
  Filled 2020-12-17: qty 1

## 2020-12-17 MED ORDER — FAMOTIDINE 20 MG IN NS 100 ML IVPB
20.0000 mg | Freq: Once | INTRAVENOUS | Status: AC
  Administered 2020-12-17: 20 mg via INTRAVENOUS
  Filled 2020-12-17: qty 100

## 2020-12-17 MED ORDER — SODIUM CHLORIDE 0.9 % IV SOLN
150.0000 mg | Freq: Once | INTRAVENOUS | Status: AC
  Administered 2020-12-17: 150 mg via INTRAVENOUS
  Filled 2020-12-17: qty 150

## 2020-12-17 MED ORDER — SODIUM CHLORIDE 0.9 % IV SOLN
406.2000 mg | Freq: Once | INTRAVENOUS | Status: AC
  Administered 2020-12-17: 410 mg via INTRAVENOUS
  Filled 2020-12-17 (×2): qty 41

## 2020-12-17 MED ORDER — SODIUM CHLORIDE 0.9 % IV SOLN
175.0000 mg/m2 | Freq: Once | INTRAVENOUS | Status: AC
  Administered 2020-12-17: 264 mg via INTRAVENOUS
  Filled 2020-12-17: qty 44

## 2020-12-17 MED ORDER — SODIUM CHLORIDE 0.9 % IV SOLN: Freq: Once | INTRAVENOUS | Status: AC

## 2020-12-17 MED ORDER — DEXAMETHASONE SODIUM PHOSPHATE 100 MG/10ML IJ SOLN
10.0000 mg | Freq: Once | INTRAMUSCULAR | Status: AC
  Administered 2020-12-17: 10 mg via INTRAVENOUS
  Filled 2020-12-17: qty 10

## 2020-12-17 MED ORDER — PALONOSETRON HCL INJECTION 0.25 MG/5ML
0.2500 mg | Freq: Once | INTRAVENOUS | Status: AC
  Administered 2020-12-17: 0.25 mg via INTRAVENOUS
  Filled 2020-12-17: qty 5

## 2020-12-17 NOTE — Patient Instructions (Signed)
[facility] MEDICAL ONCOLOGY  Discharge Instructions: Thank you for choosing [facility] to provide your oncology and hematology care.   If you have a lab appointment with the Cancer Center, please go directly to the Cancer Center and check in at the registration area.   Wear comfortable clothing and clothing appropriate for easy access to any Portacath or PICC line.   We strive to give you quality time with your provider. You may need to reschedule your appointment if you arrive late (15 or more minutes).  Arriving late affects you and other patients whose appointments are after yours.  Also, if you miss three or more appointments without notifying the office, you may be dismissed from the clinic at the provider's discretion.      For prescription refill requests, have your pharmacy contact our office and allow 72 hours for refills to be completed.    Today you received the following chemotherapy and/or immunotherapy agents:  Paclitaxel & Carboplatin      To help prevent nausea and vomiting after your treatment, we encourage you to take your nausea medication as directed.  BELOW ARE SYMPTOMS THAT SHOULD BE REPORTED IMMEDIATELY: *FEVER GREATER THAN 100.4 F (38 C) OR HIGHER *CHILLS OR SWEATING *NAUSEA AND VOMITING THAT IS NOT CONTROLLED WITH YOUR NAUSEA MEDICATION *UNUSUAL SHORTNESS OF BREATH *UNUSUAL BRUISING OR BLEEDING *URINARY PROBLEMS (pain or burning when urinating, or frequent urination) *BOWEL PROBLEMS (unusual diarrhea, constipation, pain near the anus) TENDERNESS IN MOUTH AND THROAT WITH OR WITHOUT PRESENCE OF ULCERS (sore throat, sores in mouth, or a toothache) UNUSUAL RASH, SWELLING OR PAIN  UNUSUAL VAGINAL DISCHARGE OR ITCHING   Items with * indicate a potential emergency and should be followed up as soon as possible or go to the Emergency Department if any problems should occur.  Please show the CHEMOTHERAPY ALERT CARD or IMMUNOTHERAPY ALERT CARD  at check-in to the Emergency Department and triage nurse.  Should you have questions after your visit or need to cancel or reschedule your appointment, please contact [facility]  Dept: [phone]  and follow the prompts.  Office hours are 8:00 a.m. to 4:30 p.m. Monday - Friday. Please note that voicemails left after 4:00 p.m. may not be returned until the following business day.  We are closed weekends and major holidays. You have access to a nurse at all times for urgent questions. Please call the main number to the clinic Dept: [phone] and follow the prompts.   For any non-urgent questions, you may also contact your provider using MyChart. We now offer e-Visits for anyone 18 and older to request care online for non-urgent symptoms. For details visit [email or web address].   Also download the MyChart app! Go to the app store, search "MyChart", open the app, select [facility], and log in with your MyChart username and password.  Due to Covid, a mask is required upon entering the hospital/clinic. If you do not have a mask, one will be given to you upon arrival. For doctor visits, patients may have 1 support person aged 18 or older with them. For treatment visits, patients cannot have anyone with them due to current Covid guidelines and our immunocompromised population.   

## 2020-12-17 NOTE — Assessment & Plan Note (Signed)
This is due to steroids treatment Observe only

## 2020-12-17 NOTE — Assessment & Plan Note (Signed)
She tolerated treatment extremely well with nearly no side effects She will proceed with treatment without delay I will go ahead and schedule the rest of her treatment until the end of the year

## 2020-12-17 NOTE — Progress Notes (Signed)
[facility] OFFICE PROGRESS NOTE  Patient Care Team: Lorene Dy, MD as PCP - General (Internal Medicine)  ASSESSMENT & PLAN:  Primary peritoneal carcinomatosis [facility]) She tolerated treatment extremely well with nearly no side effects She will proceed with treatment without delay I will go ahead and schedule the rest of her treatment until the end of the year  Drug-induced hyperglycemia This is due to steroids treatment Observe only  No orders of the defined types were placed in this encounter.   All questions were answered. The patient knows to call the clinic with any problems, questions or concerns. The total time spent in the appointment was 20 minutes encounter with patients including review of chart and various tests results, discussions about plan of care and coordination of care plan   [name], MD 12/17/2020 10:37 AM  INTERVAL HISTORY: Please see below for problem oriented charting. she returns for treatment follow-up She tolerated last cycle of treatment very well She has very mild myalgias and arthralgias on day 3 but that resolved quickly with acetaminophen as needed Denies nausea, constipation or peripheral neuropathy  SUMMARY OF ONCOLOGIC HISTORY: Oncology History  Primary peritoneal carcinomatosis ([place])  08/21/2020 Initial Diagnosis   The patient reports intermittent right upper quadrant pain that feel like she pulled a muscle beginning in March 2022.  In April, she woke up with sharp pain in her right upper quadrant.  This was her first episode of sharp pain.  She thought that her pain was related to her gallbladder and presented to the emergency department.  She was seen in ED on 4/30. RUQ ultrasound and x-ray were normal as were LFTs and lipase.    10/27/2020 Imaging   Findings highly suspicious for peritoneal carcinomatosis. No site of primary malignancy identified.   Colonic diverticulosis, without radiographic evidence of diverticulitis    11/02/2020 Initial Diagnosis   Primary peritoneal carcinomatosis ([place])   11/02/2020 Tumor Marker   Patient's tumor was tested for the following markers: CA-125. Results of the tumor marker test revealed 88.   11/11/2020 Pathology Results   FINAL MICROSCOPIC DIAGNOSIS:   A. CUL DE SAC, LEFT ANTERIOR, EXCISION:  -  Poorly differentiated carcinoma  -  See comment   B. SIDEWALL, RIGHT, BIOPSY:  -  Poorly differentiated carcinoma  -  See comment   COMMENT:   By immunohistochemistry, the neoplastic cells are positive for cytokeratin 7, p53, PAX8 and WT1 but negative for cytokeratin 20, GATA3, CDX2 and TTF-1.  Overall, the immunophenotype is consistent with a  gynecologic primary.    11/11/2020 Pathology Results   FINAL MICROSCOPIC DIAGNOSIS:  - Malignant cells consistent with metastatic adenocarcinoma    11/11/2020 Surgery   Pre-operative Diagnosis: Carcinomatosis, mildly elevated CA-125   Post-operative Diagnosis: same, At least stage IIIC gyn malignancy   Operation: Diagnostic laparoscopy, peritoneal biopsies    Surgeon: [name] Operative Findings: On EUA, small mobile uterus. On intra-abdominal entry, carcinomatosis appreciated studding the right anterior diaphragm, bilateral liver surfaces, mesentery, pelvic peritoneum. Omental cake with measuring up to 2x3cm. Right ovary adherent to the right uterus with peritoneal studding adjacent. Frondular implants noted along right pelvic sidewall. Miliary disease along anterior cul de sac. Cul de sac covered with friable miliary disease. Small volume dark amber ascites. Specimens: left anterior cul de sac peritoneum, right pelvic sidewall peritoneal biopsy          11/15/2020 Cancer Staging   Staging form: Ovary, Fallopian Tube, and Primary Peritoneal Carcinoma, AJCC 8th Edition -  Clinical stage from 11/15/2020: FIGO Stage III (cT3, cN0, cM0) - Signed by [name], MD on 11/15/2020 Stage prefix: Initial diagnosis   11/26/2020 -   Chemotherapy    Patient is on Treatment Plan: OVARIAN CARBOPLATIN (AUC 6) / PACLITAXEL (175) Q21D X 6 CYCLES       11/26/2020 Tumor Marker   Patient's tumor was tested for the following markers: CA-125. Results of the tumor marker test revealed 76.8.     REVIEW OF SYSTEMS:   Constitutional: Denies fevers, chills or abnormal weight loss Eyes: Denies blurriness of vision Ears, nose, mouth, throat, and face: Denies mucositis or sore throat Respiratory: Denies cough, dyspnea or wheezes Cardiovascular: Denies palpitation, chest discomfort or lower extremity swelling Gastrointestinal:  Denies nausea, heartburn or change in bowel habits Skin: Denies abnormal skin rashes Lymphatics: Denies new lymphadenopathy or easy bruising Neurological:Denies numbness, tingling or new weaknesses Behavioral/Psych: Mood is stable, no new changes  All other systems were reviewed with the patient and are negative.  I have reviewed the past medical history, past surgical history, social history and family history with the patient and they are unchanged from previous note.  ALLERGIES:  is allergic to artichoke [cynara scolymus (artichoke)].  MEDICATIONS:  Current Outpatient Medications  Medication Sig Dispense Refill   Biotin 5000 MCG TABS Take 5,000 mcg by mouth daily.     Calcium Carbonate-Vitamin D 600-400 MG-UNIT tablet Take 1 tablet by mouth daily.     Cholecalciferol 50 MCG (2000 UT) CAPS Take 2,000 Units by mouth daily.     dexamethasone (DECADRON) 4 MG tablet Take 2 tabs at the night before and 2 tab the morning of chemotherapy, every 3 weeks, by mouth x 6 cycles 36 tablet 6   Multiple Vitamin (MULTIVITAMIN WITH MINERALS) TABS tablet Take 1 tablet by mouth daily. WITH BIOTIN     ondansetron (ZOFRAN) 8 MG tablet Take 1 tablet (8 mg total) by mouth every 8 (eight) hours as needed for refractory nausea / vomiting. 30 tablet 1   prochlorperazine (COMPAZINE) 10 MG tablet Take 1 tablet (10 mg total) by  mouth every 6 (six) hours as needed (Nausea or vomiting). 30 tablet 1   risedronate (ACTONEL) 150 MG tablet Take 150 mg by mouth every 30 (thirty) days.     senna-docusate (SENOKOT-S) 8.6-50 MG tablet Take 2 tablets by mouth at bedtime. For AFTER surgery, do not take if having diarrhea 30 tablet 0   traMADol (ULTRAM) 50 MG tablet Take 1 tablet (50 mg total) by mouth every 6 (six) hours as needed for severe pain. For AFTER surgery only, do not take and drive 10 tablet 0   tretinoin (RETIN-A) 0.025 % cream Apply 1 application topically at bedtime as needed (blemishes).     No current facility-administered medications for this visit.   Facility-Administered Medications Ordered in Other Visits  Medication Dose Route Frequency Provider Last Rate Last Admin   CARBOplatin (PARAPLATIN) 410 mg in sodium chloride 0.9 % 250 mL chemo infusion  410 mg Intravenous Once Alvy Bimler, Tidus Upchurch, MD       dexamethasone (DECADRON) 10 mg in sodium chloride 0.9 % 50 mL IVPB  10 mg Intravenous Once Alvy Bimler, Yvaine Jankowiak, MD       diphenhydrAMINE (BENADRYL) injection 25 mg  25 mg Intravenous Once Alvy Bimler, Rikki Trosper, MD       famotidine (PEPCID) IVPB 20 mg in NS 100 mL IVPB  20 mg Intravenous Once Alvy Bimler, Norvin Ohlin, MD       fosaprepitant (EMEND) 150 mg in  sodium chloride 0.9 % 145 mL IVPB  150 mg Intravenous Once Alvy Bimler, Syniyah Bourne, MD       PACLitaxel (TAXOL) 264 mg in sodium chloride 0.9 % 250 mL chemo infusion (> $RemoveBef'80mg'QCRVyLJTot$ /m2)  175 mg/m2 (Treatment Plan Recorded) Intravenous Once Alvy Bimler, Jenavee Laguardia, MD       palonosetron (ALOXI) injection 0.25 mg  0.25 mg Intravenous Once Alvy Bimler, Jesica Goheen, MD        PHYSICAL EXAMINATION: ECOG PERFORMANCE STATUS: 0 - Asymptomatic  Vitals:   12/17/20 0945  BP: 120/60  Pulse: 89  Resp: 18  Temp: 98.2 F (36.8 C)  SpO2: 99%   Filed Weights   12/17/20 0945  Weight: 115 lb 6.4 oz (52.3 kg)    GENERAL:alert, no distress and comfortable SKIN: skin color, texture, turgor are normal, no rashes or significant lesions EYES: normal,  Conjunctiva are pink and non-injected, sclera clear OROPHARYNX:no exudate, no erythema and lips, buccal mucosa, and tongue normal  NECK: supple, thyroid normal size, non-tender, without nodularity LYMPH:  no palpable lymphadenopathy in the cervical, axillary or inguinal LUNGS: clear to auscultation and percussion with normal breathing effort HEART: regular rate & rhythm and no murmurs and no lower extremity edema ABDOMEN:abdomen soft, non-tender and normal bowel sounds Musculoskeletal:no cyanosis of digits and no clubbing  NEURO: alert & oriented x 3 with fluent speech, no focal motor/sensory deficits  LABORATORY DATA:  I have reviewed the data as listed    Component Value Date/Time   NA 137 12/17/2020 0930   K 4.0 12/17/2020 0930   CL 103 12/17/2020 0930   CO2 24 12/17/2020 0930   GLUCOSE 162 (H) 12/17/2020 0930   BUN 17 12/17/2020 0930   CREATININE 0.91 12/17/2020 0930   CALCIUM 9.5 12/17/2020 0930   PROT 7.5 12/17/2020 0930   ALBUMIN 4.2 12/17/2020 0930   AST 18 12/17/2020 0930   ALT 20 12/17/2020 0930   ALKPHOS 48 12/17/2020 0930   BILITOT 0.4 12/17/2020 0930   GFRNONAA >60 12/17/2020 0930   GFRAA  02/27/2007 1510    >60        The eGFR has been calculated using the MDRD equation. This calculation has not been validated in all clinical    No results found for: SPEP, UPEP  Lab Results  Component Value Date   WBC 10.5 12/17/2020   NEUTROABS 9.1 (H) 12/17/2020   HGB 12.8 12/17/2020   HCT 37.1 12/17/2020   MCV 85.3 12/17/2020   PLT 190 12/17/2020      Chemistry      Component Value Date/Time   NA 137 12/17/2020 0930   K 4.0 12/17/2020 0930   CL 103 12/17/2020 0930   CO2 24 12/17/2020 0930   BUN 17 12/17/2020 0930   CREATININE 0.91 12/17/2020 0930      Component Value Date/Time   CALCIUM 9.5 12/17/2020 0930   ALKPHOS 48 12/17/2020 0930   AST 18 12/17/2020 0930   ALT 20 12/17/2020 0930   BILITOT 0.4 12/17/2020 0930

## 2020-12-18 LAB — CA 125: Cancer Antigen (CA) 125: 58.1 U/mL — ABNORMAL HIGH (ref 0.0–38.1)

## 2020-12-23 ENCOUNTER — Telehealth: Payer: Self-pay | Admitting: *Deleted

## 2020-12-23 NOTE — Telephone Encounter (Signed)
Patient called and stated "I wanted to touch base with Dr Berline Lopes; last I saw her she said I would have three rounds of chemo then a scan. When I saw Dr Alvy Bimler with the second round she added three more rounds and then said I would have a scan. I don't care which one it is, I just wanted to make sure that they are both on the same page." Explained that Dr Berline Lopes is out of the office, but the message would be given to her and the office will call her back next week.

## 2020-12-28 ENCOUNTER — Encounter: Payer: Self-pay | Admitting: Hematology and Oncology

## 2020-12-28 NOTE — Telephone Encounter (Signed)
Called patient back. Answered questions about continued plan for 3 cycles of NACT followed by CT scan to assess possibility for IDS.

## 2021-01-06 MED FILL — Fosaprepitant Dimeglumine For IV Infusion 150 MG (Base Eq): INTRAVENOUS | Qty: 5 | Status: AC

## 2021-01-06 MED FILL — Dexamethasone Sodium Phosphate Inj 100 MG/10ML: INTRAMUSCULAR | Qty: 1 | Status: AC

## 2021-01-07 ENCOUNTER — Inpatient Hospital Stay (HOSPITAL_BASED_OUTPATIENT_CLINIC_OR_DEPARTMENT_OTHER): Payer: Medicare Other | Admitting: Hematology and Oncology

## 2021-01-07 ENCOUNTER — Encounter: Payer: Self-pay | Admitting: Hematology and Oncology

## 2021-01-07 ENCOUNTER — Other Ambulatory Visit: Payer: Self-pay

## 2021-01-07 ENCOUNTER — Inpatient Hospital Stay: Payer: Medicare Other | Attending: Gynecologic Oncology

## 2021-01-07 ENCOUNTER — Inpatient Hospital Stay: Payer: Medicare Other

## 2021-01-07 VITALS — BP 125/67 | HR 84 | Temp 97.1°F | Resp 17 | Wt 115.0 lb

## 2021-01-07 DIAGNOSIS — C482 Malignant neoplasm of peritoneum, unspecified: Secondary | ICD-10-CM

## 2021-01-07 DIAGNOSIS — M810 Age-related osteoporosis without current pathological fracture: Secondary | ICD-10-CM | POA: Diagnosis not present

## 2021-01-07 DIAGNOSIS — Z5111 Encounter for antineoplastic chemotherapy: Secondary | ICD-10-CM | POA: Diagnosis present

## 2021-01-07 DIAGNOSIS — T50905A Adverse effect of unspecified drugs, medicaments and biological substances, initial encounter: Secondary | ICD-10-CM

## 2021-01-07 DIAGNOSIS — E0965 Drug or chemical induced diabetes mellitus with hyperglycemia: Secondary | ICD-10-CM | POA: Diagnosis not present

## 2021-01-07 DIAGNOSIS — R739 Hyperglycemia, unspecified: Secondary | ICD-10-CM | POA: Diagnosis not present

## 2021-01-07 LAB — CBC WITH DIFFERENTIAL (CANCER CENTER ONLY)
Abs Immature Granulocytes: 0.02 10*3/uL (ref 0.00–0.07)
Basophils Absolute: 0 10*3/uL (ref 0.0–0.1)
Basophils Relative: 0 %
Eosinophils Absolute: 0 10*3/uL (ref 0.0–0.5)
Eosinophils Relative: 0 %
HCT: 39.5 % (ref 36.0–46.0)
Hemoglobin: 13.3 g/dL (ref 12.0–15.0)
Immature Granulocytes: 0 %
Lymphocytes Relative: 13 %
Lymphs Abs: 0.8 10*3/uL (ref 0.7–4.0)
MCH: 29.1 pg (ref 26.0–34.0)
MCHC: 33.7 g/dL (ref 30.0–36.0)
MCV: 86.4 fL (ref 80.0–100.0)
Monocytes Absolute: 0.2 10*3/uL (ref 0.1–1.0)
Monocytes Relative: 3 %
Neutro Abs: 5 10*3/uL (ref 1.7–7.7)
Neutrophils Relative %: 84 %
Platelet Count: 183 10*3/uL (ref 150–400)
RBC: 4.57 MIL/uL (ref 3.87–5.11)
RDW: 12.9 % (ref 11.5–15.5)
WBC Count: 5.9 10*3/uL (ref 4.0–10.5)
nRBC: 0 % (ref 0.0–0.2)

## 2021-01-07 LAB — CMP (CANCER CENTER ONLY)
ALT: 15 U/L (ref 0–44)
AST: 18 U/L (ref 15–41)
Albumin: 4.4 g/dL (ref 3.5–5.0)
Alkaline Phosphatase: 45 U/L (ref 38–126)
Anion gap: 10 (ref 5–15)
BUN: 20 mg/dL (ref 8–23)
CO2: 23 mmol/L (ref 22–32)
Calcium: 9.5 mg/dL (ref 8.9–10.3)
Chloride: 104 mmol/L (ref 98–111)
Creatinine: 0.83 mg/dL (ref 0.44–1.00)
GFR, Estimated: >60 mL/min (ref >60)
Glucose, Bld: 182 mg/dL — ABNORMAL HIGH (ref 70–99)
Potassium: 4 mmol/L (ref 3.5–5.1)
Sodium: 137 mmol/L (ref 135–145)
Total Bilirubin: 0.4 mg/dL (ref 0.3–1.2)
Total Protein: 7.5 g/dL (ref 6.5–8.1)

## 2021-01-07 MED ORDER — PALONOSETRON HCL INJECTION 0.25 MG/5ML
0.2500 mg | Freq: Once | INTRAVENOUS | Status: AC
  Administered 2021-01-07: 0.25 mg via INTRAVENOUS
  Filled 2021-01-07: qty 5

## 2021-01-07 MED ORDER — FAMOTIDINE 20 MG IN NS 100 ML IVPB
20.0000 mg | Freq: Once | INTRAVENOUS | Status: AC
  Administered 2021-01-07: 20 mg via INTRAVENOUS
  Filled 2021-01-07: qty 100

## 2021-01-07 MED ORDER — SODIUM CHLORIDE 0.9 % IV SOLN
10.0000 mg | Freq: Once | INTRAVENOUS | Status: AC
  Administered 2021-01-07: 10 mg via INTRAVENOUS
  Filled 2021-01-07: qty 10

## 2021-01-07 MED ORDER — SODIUM CHLORIDE 0.9 % IV SOLN: Freq: Once | INTRAVENOUS | Status: AC

## 2021-01-07 MED ORDER — SODIUM CHLORIDE 0.9 % IV SOLN
406.2000 mg | Freq: Once | INTRAVENOUS | Status: AC
  Administered 2021-01-07: 410 mg via INTRAVENOUS
  Filled 2021-01-07: qty 41

## 2021-01-07 MED ORDER — SODIUM CHLORIDE 0.9 % IV SOLN
150.0000 mg | Freq: Once | INTRAVENOUS | Status: AC
  Administered 2021-01-07: 150 mg via INTRAVENOUS
  Filled 2021-01-07: qty 150

## 2021-01-07 MED ORDER — DIPHENHYDRAMINE HCL 50 MG/ML IJ SOLN
25.0000 mg | Freq: Once | INTRAMUSCULAR | Status: AC
  Administered 2021-01-07: 25 mg via INTRAVENOUS
  Filled 2021-01-07: qty 1

## 2021-01-07 MED ORDER — SODIUM CHLORIDE 0.9 % IV SOLN
175.0000 mg/m2 | Freq: Once | INTRAVENOUS | Status: AC
  Administered 2021-01-07: 264 mg via INTRAVENOUS
  Filled 2021-01-07: qty 44

## 2021-01-07 NOTE — Progress Notes (Signed)
[facility] OFF[facility] PROGRESS NOTE  Patient Care Team: Lorene Dy, MD as PCP - General (Internal Medicine)  ASSESSMENT & PLAN:  Primary peritoneal carcinomatosis ([place]) Overall, she is responding well to treatment She tolerated chemotherapy very well except for mild arthralgias for few days after treatment Her tumor marker is trending down I will order CT imaging in 2 weeks with follow-up with GYN surgeon to determine whether she would be a candidate for interval debulking surgery For now, I will keep her chemotherapy as scheduled  Drug-induced hyperglycemia This is due to steroids treatment Observe only  Orders Placed This Encounter  Procedures   CT ABDOMEN PELVIS W CONTRAST    Standing Status:   Future    Standing Expiration Date:   01/07/2022    Order Specific Question:   If indicated for the ordered procedure, I authorize the administration of contrast media per Radiology protocol    Answer:   Yes    Order Specific Question:   Preferred imaging location?    Answer:   [facility]    Order Specific Question:   Radiology Contrast Protocol - do NOT remove file path    Answer:   [email or web address]    All questions were answered. The patient knows to call the clinic with any problems, questions or concerns. The total time spent in the appointment was 20 minutes encounter with patients including review of chart and various tests results, discussions about plan of care and coordination of care plan   [name], MD 01/07/2021 10:43 AM  INTERVAL HISTORY: Please see below for problem oriented charting. she returns for treatment follow-up for primary peritoneal carcinomatosis, to be seen prior to cycle 3 of chemotherapy She tolerated last cycle of therapy well Denies nausea or constipation No peripheral neuropathy She has muscle aches and pain for few days after chemo that usually resolved with conservative  approach  REVIEW OF SYSTEMS:   Constitutional: Denies fevers, chills or abnormal weight loss Eyes: Denies blurriness of vision Ears, nose, mouth, throat, and face: Denies mucositis or sore throat Respiratory: Denies cough, dyspnea or wheezes Cardiovascular: Denies palpitation, chest discomfort or lower extremity swelling Gastrointestinal:  Denies nausea, heartburn or change in bowel habits Skin: Denies abnormal skin rashes Lymphatics: Denies new lymphadenopathy or easy bruising Neurological:Denies numbness, tingling or new weaknesses Behavioral/Psych: Mood is stable, no new changes  All other systems were reviewed with the patient and are negative.  I have reviewed the past medical history, past surgical history, social history and family history with the patient and they are unchanged from previous note.  ALLERGIES:  is allergic to artichoke [cynara scolymus (artichoke)].  MEDICATIONS:  Current Outpatient Medications  Medication Sig Dispense Refill   Biotin 5000 MCG TABS Take 5,000 mcg by mouth daily.     Calcium Carbonate-Vitamin D 600-400 MG-UNIT tablet Take 1 tablet by mouth daily.     Cholecalciferol 50 MCG (2000 UT) CAPS Take 2,000 Units by mouth daily.     dexamethasone (DECADRON) 4 MG tablet Take 2 tabs at the night before and 2 tab the morning of chemotherapy, every 3 weeks, by mouth x 6 cycles 36 tablet 6   Multiple Vitamin (MULTIVITAMIN WITH MINERALS) TABS tablet Take 1 tablet by mouth daily. WITH BIOTIN     ondansetron (ZOFRAN) 8 MG tablet Take 1 tablet (8 mg total) by mouth every 8 (eight) hours as needed for refractory nausea / vomiting. 30 tablet 1   prochlorperazine (COMPAZINE) 10 MG tablet Take 1 tablet (  10 mg total) by mouth every 6 (six) hours as needed (Nausea or vomiting). 30 tablet 1   risedronate (ACTONEL) 150 MG tablet Take 150 mg by mouth every 30 (thirty) days.     senna-docusate (SENOKOT-S) 8.6-50 MG tablet Take 2 tablets by mouth at bedtime. For AFTER  surgery, do not take if having diarrhea 30 tablet 0   traMADol (ULTRAM) 50 MG tablet Take 1 tablet (50 mg total) by mouth every 6 (six) hours as needed for severe pain. For AFTER surgery only, do not take and drive 10 tablet 0   tretinoin (RETIN-A) 0.025 % cream Apply 1 application topically at bedtime as needed (blemishes).     No current facility-administered medications for this visit.    SUMMARY OF ONCOLOGIC HISTORY: Oncology History  Primary peritoneal carcinomatosis ([place])  08/21/2020 Initial Diagnosis   The patient reports intermittent right upper quadrant pain that feel like she pulled a muscle beginning in March 2022.  In April, she woke up with sharp pain in her right upper quadrant.  This was her first episode of sharp pain.  She thought that her pain was related to her gallbladder and presented to the emergency department.  She was seen in ED on 4/30. RUQ ultrasound and x-ray were normal as were LFTs and lipase.    10/27/2020 Imaging   Findings highly suspicious for peritoneal carcinomatosis. No site of primary malignancy identified.   Colonic diverticulosis, without radiographic evidence of diverticulitis   11/02/2020 Initial Diagnosis   Primary peritoneal carcinomatosis ([place])   11/02/2020 Tumor Marker   Patient's tumor was tested for the following markers: CA-125. Results of the tumor marker test revealed 88.   11/11/2020 Pathology Results   FINAL MICROSCOPIC DIAGNOSIS:   A. CUL DE SAC, LEFT ANTERIOR, EXCISION:  -  Poorly differentiated carcinoma  -  See comment   B. SIDEWALL, RIGHT, BIOPSY:  -  Poorly differentiated carcinoma  -  See comment   COMMENT:   By immunohistochemistry, the neoplastic cells are positive for cytokeratin 7, p53, PAX8 and WT1 but negative for cytokeratin 20, GATA3, CDX2 and TTF-1.  Overall, the immunophenotype is consistent with a  gynecologic primary.    11/11/2020 Pathology Results   FINAL MICROSCOPIC DIAGNOSIS:  - Malignant cells consistent  with metastatic adenocarcinoma    11/11/2020 Surgery   Pre-operative Diagnosis: Carcinomatosis, mildly elevated CA-125   Post-operative Diagnosis: same, At least stage IIIC gyn malignancy   Operation: Diagnostic laparoscopy, peritoneal biopsies    Surgeon: [name] Operative Findings: On EUA, small mobile uterus. On intra-abdominal entry, carcinomatosis appreciated studding the right anterior diaphragm, bilateral liver surfaces, mesentery, pelvic peritoneum. Omental cake with measuring up to 2x3cm. Right ovary adherent to the right uterus with peritoneal studding adjacent. Frondular implants noted along right pelvic sidewall. Miliary disease along anterior cul de sac. Cul de sac covered with friable miliary disease. Small volume dark amber ascites. Specimens: left anterior cul de sac peritoneum, right pelvic sidewall peritoneal biopsy          11/15/2020 Cancer Staging   Staging form: Ovary, Fallopian Tube, and Primary Peritoneal Carcinoma, AJCC 8th Edition - Clinical stage from 11/15/2020: FIGO Stage III (cT3, cN0, cM0) - Signed by [name], MD on 11/15/2020 Stage prefix: Initial diagnosis   11/26/2020 -  Chemotherapy    Patient is on Treatment Plan: OVARIAN CARBOPLATIN (AUC 6) / PACLITAXEL (175) Q21D X 6 CYCLES       11/26/2020 Tumor Marker   Patient's tumor was tested for  the following markers: CA-125. Results of the tumor marker test revealed 76.8.   12/17/2020 Tumor Marker   Patient's tumor was tested for the following markers: CA-125. Results of the tumor marker test revealed 58.1.     PHYSICAL EXAMINATION: ECOG PERFORMANCE STATUS: 1 - Symptomatic but completely ambulatory  Vitals:   01/07/21 0918  BP: 125/67  Pulse: 84  Resp: 17  Temp: (!) 97.1 F (36.2 C)  SpO2: 100%   Filed Weights   01/07/21 0918  Weight: 115 lb (52.2 kg)    GENERAL:alert, no distress and comfortable SKIN: skin color, texture, turgor are normal, no rashes or significant  lesions EYES: normal, Conjunctiva are pink and non-injected, sclera clear OROPHARYNX:no exudate, no erythema and lips, buccal mucosa, and tongue normal  NECK: supple, thyroid normal size, non-tender, without nodularity LYMPH:  no palpable lymphadenopathy in the cervical, axillary or inguinal LUNGS: clear to auscultation and percussion with normal breathing effort HEART: regular rate & rhythm and no murmurs and no lower extremity edema ABDOMEN:abdomen soft, non-tender and normal bowel sounds Musculoskeletal:no cyanosis of digits and no clubbing  NEURO: alert & oriented x 3 with fluent speech, no focal motor/sensory deficits  LABORATORY DATA:  I have reviewed the data as listed    Component Value Date/Time   NA 137 01/07/2021 0851   K 4.0 01/07/2021 0851   CL 104 01/07/2021 0851   CO2 23 01/07/2021 0851   GLUCOSE 182 (H) 01/07/2021 0851   BUN 20 01/07/2021 0851   CREATININE 0.83 01/07/2021 0851   CALCIUM 9.5 01/07/2021 0851   PROT 7.5 01/07/2021 0851   ALBUMIN 4.4 01/07/2021 0851   AST 18 01/07/2021 0851   ALT 15 01/07/2021 0851   ALKPHOS 45 01/07/2021 0851   BILITOT 0.4 01/07/2021 0851   GFRNONAA >60 01/07/2021 0851   GFRAA  02/27/2007 1510    >60        The eGFR has been calculated using the MDRD equation. This calculation has not been validated in all clinical    No results found for: SPEP, UPEP  Lab Results  Component Value Date   WBC 5.9 01/07/2021   NEUTROABS 5.0 01/07/2021   HGB 13.3 01/07/2021   HCT 39.5 01/07/2021   MCV 86.4 01/07/2021   PLT 183 01/07/2021      Chemistry      Component Value Date/Time   NA 137 01/07/2021 0851   K 4.0 01/07/2021 0851   CL 104 01/07/2021 0851   CO2 23 01/07/2021 0851   BUN 20 01/07/2021 0851   CREATININE 0.83 01/07/2021 0851      Component Value Date/Time   CALCIUM 9.5 01/07/2021 0851   ALKPHOS 45 01/07/2021 0851   AST 18 01/07/2021 0851   ALT 15 01/07/2021 0851   BILITOT 0.4 01/07/2021 0851

## 2021-01-07 NOTE — Assessment & Plan Note (Signed)
Overall, she is responding well to treatment She tolerated chemotherapy very well except for mild arthralgias for few days after treatment Her tumor marker is trending down I will order CT imaging in 2 weeks with follow-up with GYN surgeon to determine whether she would be a candidate for interval debulking surgery For now, I will keep her chemotherapy as scheduled

## 2021-01-07 NOTE — Patient Instructions (Signed)
[facility] MEDICAL [facility]COLOG[facility]  Discharge Instructions: Thank you for choosing [facility] to provide your oncology and hematology care.   If you have a lab appointment with the Cancer Center, please go directly to the Cancer Center and check in at the registration area.   Wear comfortable clothing and clothing appropriate for easy access to any Portacath or PICC line.   We strive to give you quality time with your provider. You may need to reschedule your appointment if you arrive late (15 or more minutes).  Arriving late affects you and other patients whose appointments are after yours.  Also, if you miss three or more appointments without notifying the office, you may be dismissed from the clinic at the provider's discretion.      For prescription refill requests, have your pharmacy contact our office and allow 72 hours for refills to be completed.    Today you received the following chemotherapy and/or immunotherapy agents:  Paclitaxel & Carboplatin      To help prevent nausea and vomiting after your treatment, we encourage you to take your nausea medication as directed.  BELOW ARE SYMPTOMS THAT SHOULD BE REPORTED IMMEDIATELY: *FEVER GREATER THAN 100.4 F (38 C) OR HIGHER *CHILLS OR SWEATING *NAUSEA AND VOMITING THAT IS NOT CONTROLLED WITH YOUR NAUSEA MEDICATION *UNUSUAL SHORTNESS OF BREATH *UNUSUAL BRUISING OR BLEEDING *URINARY PROBLEMS (pain or burning when urinating, or frequent urination) *BOWEL PROBLEMS (unusual diarrhea, constipation, pain near the anus) TENDERNESS IN MOUTH AND THROAT WITH OR WITHOUT PRESENCE OF ULCERS (sore throat, sores in mouth, or a toothache) UNUSUAL RASH, SWELLING OR PAIN  UNUSUAL VAGINAL DISCHARGE OR ITCHING   Items with * indicate a potential emergency and should be followed up as soon as possible or go to the Emergency Department if any problems should occur.  Please show the CHEMOTHERAPY ALERT CARD or IMMUNOTHERAPY ALERT CARD  at check-in to the Emergency Department and triage nurse.  Should you have questions after your visit or need to cancel or reschedule your appointment, please contact [facility]  Dept: [phone]  and follow the prompts.  Office hours are 8:00 a.m. to 4:30 p.m. Monday - Friday. Please note that voicemails left after 4:00 p.m. may not be returned until the following business day.  We are closed weekends and major holidays. You have access to a nurse at all times for urgent questions. Please call the main number to the clinic Dept: [phone] and follow the prompts.   For any non-urgent questions, you may also contact your provider using MyChart. We now offer e-Visits for anyone 18 and older to request care online for non-urgent symptoms. For details visit [email or web address].   Also download the MyChart app! Go to the app store, search "MyChart", open the app, select [facility], and log in with your MyChart username and password.  Due to Covid, a mask is required upon entering the hospital/clinic. If you do not have a mask, one will be given to you upon arrival. For doctor visits, patients may have 1 support person aged 18 or older with them. For treatment visits, patients cannot have anyone with them due to current Covid guidelines and our immunocompromised population.   

## 2021-01-07 NOTE — Assessment & Plan Note (Signed)
This is due to steroids treatment Observe only

## 2021-01-08 LAB — CA 125: Cancer Antigen (CA) 125: 21.6 U/mL (ref 0.0–38.1)

## 2021-01-10 ENCOUNTER — Telehealth: Payer: Self-pay | Admitting: *Deleted

## 2021-01-10 NOTE — Telephone Encounter (Signed)
Per Dr.Gorsuch, called pt with message below. Pt was appreciative and verbalized understanding.

## 2021-01-10 NOTE — Telephone Encounter (Signed)
-----   Message from [name], MD sent at 01/10/2021  8:00 AM EDT ----- Please let her know CA-125 is now normal

## 2021-01-19 ENCOUNTER — Encounter (HOSPITAL_BASED_OUTPATIENT_CLINIC_OR_DEPARTMENT_OTHER): Payer: Self-pay

## 2021-01-19 ENCOUNTER — Ambulatory Visit (HOSPITAL_BASED_OUTPATIENT_CLINIC_OR_DEPARTMENT_OTHER)
Admission: RE | Admit: 2021-01-19 | Discharge: 2021-01-19 | Disposition: A | Discharge status: Home / Self Care | Payer: Medicare Other | Source: Ambulatory Visit | Attending: Hematology and Oncology | Admitting: Hematology and Oncology

## 2021-01-19 ENCOUNTER — Other Ambulatory Visit: Payer: Self-pay

## 2021-01-19 DIAGNOSIS — C482 Malignant neoplasm of peritoneum, unspecified: Secondary | ICD-10-CM | POA: Insufficient documentation

## 2021-01-19 MED ORDER — IOHEXOL 350 MG/ML SOLN
50.0000 mL | Freq: Once | INTRAVENOUS | Status: AC | PRN
  Administered 2021-01-19: 50 mL via INTRAVENOUS

## 2021-01-21 ENCOUNTER — Inpatient Hospital Stay (HOSPITAL_BASED_OUTPATIENT_CLINIC_OR_DEPARTMENT_OTHER): Payer: Medicare Other | Admitting: Gynecologic Oncology

## 2021-01-21 ENCOUNTER — Other Ambulatory Visit: Payer: Self-pay

## 2021-01-21 ENCOUNTER — Encounter: Payer: Self-pay | Admitting: Gynecologic Oncology

## 2021-01-21 VITALS — BP 132/60 | HR 77 | Temp 98.3°F | Resp 16 | Ht 64.0 in | Wt 116.0 lb

## 2021-01-21 DIAGNOSIS — Z5111 Encounter for antineoplastic chemotherapy: Secondary | ICD-10-CM | POA: Diagnosis not present

## 2021-01-21 DIAGNOSIS — C482 Malignant neoplasm of peritoneum, unspecified: Secondary | ICD-10-CM | POA: Diagnosis not present

## 2021-01-21 MED ORDER — PEG 3350-KCL-NABCB-NACL-NASULF 236 G PO SOLR: 2000.0000 mL | Freq: Once | ORAL | 0 refills | Status: AC

## 2021-01-21 MED ORDER — ERYTHROMYCIN BASE 500 MG PO TABS: ORAL_TABLET | ORAL | 0 refills | Status: DC

## 2021-01-21 MED ORDER — NEOMYCIN SULFATE 500 MG PO TABS: ORAL_TABLET | ORAL | 0 refills | Status: DC

## 2021-01-21 MED ORDER — ENOXAPARIN SODIUM 40 MG/0.4ML IJ SOSY: 40.0000 mg | PREFILLED_SYRINGE | INTRAMUSCULAR | 0 refills | Status: DC

## 2021-01-21 NOTE — Patient Instructions (Addendum)
Preparing for your Surgery  Plan for surgery on February 24, 2021 with Dr. Jeral Pinch at [facility] will be scheduled for robotic assisted total laparoscopic hysterectomy (removal of the uterus and cervix), bilateral salpingo-oophorectomy (removal of both ovaries and fallopian tubes), omentectomy (removal of the omentum), mini laparotomy (slightly larger incision), possible laparotomy (larger incision if needed), possible bowel resection.   Pre-operative Testing -You will receive a phone call from presurgical testing at [facility] to arrange for a pre-operative appointment and lab work.  -Bring your insurance card, copy of an advanced directive if applicable, medication list  -At that visit, you will be asked to sign a consent for a possible blood transfusion in case a transfusion becomes necessary during surgery.  The need for a blood transfusion is rare but having consent is a necessary part of your care.     -You should not be taking blood thinners or aspirin at least ten days prior to surgery unless instructed by your surgeon.  -Do not take supplements such as fish oil (omega 3), red yeast rice, turmeric before your surgery. You want to avoid medications with aspirin in them including headache powders such as BC or Goody's), Excedrin migraine.  Day Before Surgery at [facility]  If your bowels are filled with gas, your surgeon will have difficulty visualizing your pelvic organs which increases your surgical risks.  Your role in recovery Your role is to become active as soon as directed by your doctor, while still giving yourself time to heal.  Rest when you feel tired. You will be asked to do the following in order to speed your recovery:  - Cough and breathe deeply. This helps to clear and expand your lungs and can prevent pneumonia after surgery.  - [place]. Do mild physical activity. Walking or moving your legs help your  circulation and body functions return to normal. Do not try to get up or walk alone the first time after surgery.   -If you develop swelling on one leg or the other, pain in the back of your leg, redness/warmth in one of your legs, please call the office or go to the Emergency Room to have a doppler to rule out a blood clot. For shortness of breath, chest pain-seek care in the Emergency Room as soon as possible. - Actively manage your pain. Managing your pain lets you move in comfort. We will ask you to rate your pain on a scale of zero to 10. It is your responsibility to tell your doctor or nurse where and how much you hurt so your pain can be treated.  Special Considerations -If you are diabetic, you may be placed on insulin after surgery to have closer control over your blood sugars to promote healing and recovery.  This does not mean that you will be discharged on insulin.  If applicable, your oral antidiabetics will be resumed when you are tolerating a solid diet.  -Your final pathology results from surgery should be available around one week after surgery and the results will be relayed to you when available.  -Dr. Lahoma Crocker is the surgeon that assists your GYN Oncologist with surgery.  If you end up staying the night, the next day after your surgery you will either see Dr. Denman George, Dr. Berline Lopes, or Dr. Lahoma Crocker.  -FMLA forms can be faxed to [phone] and please allow 5-7 business days for completion.  Pain Management After Surgery -You  have been prescribed your pain medication and bowel regimen medications before surgery so that you can have these available when you are discharged from the hospital. The pain medication is for use ONLY AFTER surgery and a new prescription will not be given.   -Make sure that you have Tylenol and Ibuprofen at home to use on a regular basis after surgery for pain control. We recommend alternating the medications every hour to six hours since  they work differently and are processed in the body differently for pain relief.  -Review the attached handout on narcotic use and their risks and side effects.   Bowel Regimen -You have been prescribed Sennakot-S to take nightly to prevent constipation especially if you are taking the narcotic pain medication intermittently.  It is important to prevent constipation and drink adequate amounts of liquids. You can stop taking this medication when you are not taking pain medication and you are back on your normal bowel routine.  Risks of Surgery Risks of surgery are low but include bleeding, infection, damage to surrounding structures, re-operation, blood clots, and very rarely death.   Blood Transfusion Information (For the consent to be signed before surgery)  We will be checking your blood type before surgery so in case of emergencies, we will know what type of blood you would need.                                            WHAT IS A BLOOD TRANSFUSION?  A transfusion is the replacement of blood or some of its parts. Blood is made up of multiple cells which provide different functions. Red blood cells carry oxygen and are used for blood loss replacement. White blood cells fight against infection. Platelets control bleeding. Plasma helps clot blood. Other blood products are available for specialized needs, such as hemophilia or other clotting disorders. BEFORE THE TRANSFUSION  Who gives blood for transfusions?  You may be able to donate blood to be used at a later date on yourself (autologous donation). Relatives can be asked to donate blood. This is generally not any safer than if you have received blood from a stranger. The same precautions are taken to ensure safety when a relative's blood is donated. Healthy volunteers who are fully evaluated to make sure their blood is safe. This is blood bank blood. Transfusion therapy is the safest it has ever been in the practice of medicine.  Before blood is taken from a donor, a complete history is taken to make sure that person has no history of diseases nor engages in risky social behavior (examples are intravenous drug use or sexual activity with multiple partners). The donor's travel history is screened to minimize risk of transmitting infections, such as malaria. The donated blood is tested for signs of infectious diseases, such as HIV and hepatitis. The blood is then tested to be sure it is compatible with you in order to minimize the chance of a transfusion reaction. If you or a relative donates blood, this is often done in anticipation of surgery and is not appropriate for emergency situations. It takes many days to process the donated blood. RISKS AND COMPLICATIONS Although transfusion therapy is very safe and saves many lives, the main dangers of transfusion include:  Getting an infectious disease. Developing a transfusion reaction. This is an allergic reaction to something in the blood you were given.  Every precaution is taken to prevent this. The decision to have a blood transfusion has been considered carefully by your caregiver before blood is given. Blood is not given unless the benefits outweigh the risks.  AFTER SURGERY INSTRUCTIONS  Return to work: 6 weeks if applicable  You will have lovenox injections after surgery that are given once a day for 2 weeks if your surgery is able to be done robotically. If you have open surgery, you would have 4 weeks of the injections to prevent blood clots.   You will have a white honeycomb dressing over your larger incision. This dressing can be removed 5 days after surgery and you do not need to reapply a new dressing. Once you remove the dressing, you will notice that you have the surgical glue (dermabond) on the incision and this will peel off on its own. You can get this dressing wet in the shower the days after surgery prior to removal on the 5th day.   Activity: 1. Be up and out  of the bed during the day.  Take a nap if needed.  You may walk up steps but be careful and use the hand rail.  Stair climbing will tire you more than you think, you may need to stop part way and rest.   2. No lifting or straining for 6 weeks over 10 pounds. No pushing, pulling, straining for 6 weeks.  3. No driving for 1 week(s).  Do not drive if you are taking narcotic pain medicine and make sure that your reaction time has returned.   4. You can shower as soon as the next day after surgery. Shower daily.  Use your regular soap and water (not directly on the incision) and pat your incision(s) dry afterwards; don't rub.  No tub baths or submerging your body in water until cleared by your surgeon. If you have the soap that was given to you by pre-surgical testing that was used before surgery, you do not need to use it afterwards because this can irritate your incisions.   5. No sexual activity and nothing in the vagina for 8 weeks.  6. You may experience a small amount of clear drainage from your incisions, which is normal.  If the drainage persists, increases, or changes color please call the office.  7. Do not use creams, lotions, or ointments such as neosporin on your incisions after surgery until advised by your surgeon because they can cause removal of the dermabond glue on your incisions.    8. You may experience vaginal spotting after surgery or around the 6-8 week mark from surgery when the stitches at the top of the vagina begin to dissolve.  The spotting is normal but if you experience heavy bleeding, call our office.  9. Take Tylenol or ibuprofen first for pain and only use narcotic pain medication for severe pain not relieved by the Tylenol or Ibuprofen.  Monitor your Tylenol intake to a max of 4,000 mg in a 24 hour period. You can alternate these medications after surgery.  Diet: 1. Low sodium Heart Healthy Diet is recommended but you are cleared to resume your normal (before surgery)  diet after your procedure.  2. It is safe to use a laxative, such as Miralax or Colace, if you have difficulty moving your bowels. You have been prescribed Sennakot at bedtime every evening to keep bowel movements regular and to prevent constipation.    Wound Care: 1. Keep clean and dry.  Shower daily.  Reasons to call the Doctor: Fever - Oral temperature greater than 100.4 degrees Fahrenheit Foul-smelling vaginal discharge Difficulty urinating Nausea and vomiting Increased pain at the site of the incision that is unrelieved with pain medicine. Difficulty breathing with or without chest pain New calf pain especially if only on one side Sudden, continuing increased vaginal bleeding with or without clots.   Contacts: For questions or concerns you should contact:  Dr. Jeral Pinch at [phone]  Joylene John, NP at [phone]  After Hours: call [phone] and have the GYN Oncologist paged/contacted (after 5 pm or on the weekends).  Messages sent via mychart are for non-urgent matters and are not responded to after hours so for urgent needs, please call the after hours number.  [facility] BOWEL PREP   FIVE DAYS PRIOR TO YOUR SURGERY  Obtain supplies for the bowel prep at a pharmacy of your choice: Office-e-mailed prescriptions for your antibiotic pills (Neomycin & Erythromycin)  Golytely at the pharmacy    Change your diet to make the bowel prep go more easily: Switch to a bland, low fiber diet Stop eating any nuts, popcorn, or fruit with seeds.  Stop all fiber supplements such as Metamucil, Miralax, etc.    Improve nutrition: Consider drinking 2-3 nutritional shakes (Ex: Ensure Surgery) every day, starting 5 days prior to surgery   DAY PRIOR TO SURGERY   Switch to a full liquid diet the day before surgery Drink plenty of liquids all day to avoid getting dehydrated    12:00pm Drink 1/2 a jug of golytely.   (You should finish in 2 hours)  2:00pm Take 2  Neomycin 500mg  tablets & 2 Erythromycin 500mg  tablets  3:00pm Take 2 Neomycin 500mg  tablets & 2 Erythromycin 500mg  tablets  Drink plenty of clear liquids all evening to avoid getting dehydrated  10:00pm Take 2 Neomycin 500mg  tablets & 2 Erythromycin 500mg  tablets  Drink 2 Carbohydrate loading nutrition drinks (ex: Ensure Presurgery). These will be given at pre-op appointment. Do not eat anything solid after bedtime (midnight) the night before your surgery.   BUT DO drink plenty of clear liquids (Water, Gatorade, juice, soda, coffee, tea, broths, etc.) up to 3 hours prior to surgery to avoid getting dehydrated.    MORNING OF SURGERY  Remember to not to eat anything solid that morning  Drink one final carbohydrate loading nutritional drink (ex: Ensure Presurgery) upon waking up in the morning (needs to be 3 hours before your surgery). Hold or take medications as recommended by the hospital staff at your Preoperative visit Stop drinking liquids before you leave the house (>3 hours prior to surgery)    If you have questions or concerns, please call GYN Oncology Office at [phone] during business hours to speak to the clinical staff for advice.    WHAT DO I NEED TO KNOW ABOUT A FULL LIQUID DIET? -You may have any liquid. -You may have any food that becomes a liquid at room temperature. The food is considered a liquid if it can be poured off a spoon at room temperature. WHAT FOODS CAN I EAT? -Grains: Any grain food that can be pureed in soup (such as crackers, pasta, and rice). Hot cereal (such as farina or oatmeal) that has been blended.  -Fruits: Fruit juice, including nectars and juices. -Meats and Other Protein Sources: Eggs in custard, eggnog mix, and eggs used in ice cream or pudding. Strained meats, like in baby food, may be allowed. Consult your health care provider.  -Dairy: Milk and  milk-based beverages, including milk shakes and instant breakfast mixes. Smooth yogurt.  Pureed cottage cheese. Avoid these foods if they are not well tolerated. -Beverages: All beverages, including liquid nutritional supplements. AVOID CARBONATED BEVERAGES. -Condiments: Iodized salt, pepper, spices, and flavorings. Cocoa powder. Vinegar, ketchup, yellow mustard, smooth sauces (such as hollandaise, cheese sauce, or white sauce), and soy sauce. -Sweets and Desserts: Custard, smooth pudding. Flavored gelatin. Tapioca, junket. Plain ice cream, sherbet, fruit ices. Frozen ice pops, frozen fudge pops, pudding pops, and other frozen bars with cream. Syrups, including chocolate syrup. Sugar, honey, jelly.  -Fats and Oils: Margarine, butter, cream, sour cream, and oils. -Other: Broth and cream soups. Strained, broth-based soups. The items listed above may not be a complete list of recommended foods or beverages.  WHAT FOODS CAN I NOT EAT? Grains: All breads. Grains are not allowed unless they are pureed into soup. Vegetables: No raw vegetables. Vegetables are not allowed unless they are juiced, or cooked and pureed into soup. Fruits: Fruits are not allowed unless they are juiced. Meats and Other Protein Sources: Any meat or fish. Cooked or raw eggs. Nut butters.  Dairy: Cheese.  Condiments: Stone ground mustards. Fats and Oils: Fats that are coarse or chunky. Sweets and Desserts: Ice cream or other frozen desserts that have any solids in them or on top, such as nuts, chocolate chips, and pieces of cookies. Cakes. Cookies. Candy. Others: Soups with chunks or pieces in them.

## 2021-01-21 NOTE — Progress Notes (Signed)
Gynecologic Oncology Return Clinic Visit  01/21/2021  Reason for Visit: Treatment planning  Treatment History: Oncology History  Primary peritoneal carcinomatosis ([place])  08/21/2020 Initial Diagnosis   The patient reports intermittent right upper quadrant pain that feel like she pulled a muscle beginning in March 2022.  In April, she woke up with sharp pain in her right upper quadrant.  This was her first episode of sharp pain.  She thought that her pain was related to her gallbladder and presented to the emergency department.  She was seen in ED on 4/30. RUQ ultrasound and x-ray were normal as were LFTs and lipase.    10/27/2020 Imaging   Findings highly suspicious for peritoneal carcinomatosis. No site of primary malignancy identified.   Colonic diverticulosis, without radiographic evidence of diverticulitis   11/02/2020 Initial Diagnosis   Primary peritoneal carcinomatosis ([place])   11/02/2020 Tumor Marker   Patient's tumor was tested for the following markers: CA-125. Results of the tumor marker test revealed 88.   11/11/2020 Pathology Results   FINAL MICROSCOPIC DIAGNOSIS:   A. CUL DE SAC, LEFT ANTERIOR, EXCISION:  -  Poorly differentiated carcinoma  -  See comment   B. SIDEWALL, RIGHT, BIOPSY:  -  Poorly differentiated carcinoma  -  See comment   COMMENT:   By immunohistochemistry, the neoplastic cells are positive for cytokeratin 7, p53, PAX8 and WT1 but negative for cytokeratin 20, GATA3, CDX2 and TTF-1.  Overall, the immunophenotype is consistent with a  gynecologic primary.    11/11/2020 Pathology Results   FINAL MICROSCOPIC DIAGNOSIS:  - Malignant cells consistent with metastatic adenocarcinoma    11/11/2020 Surgery   Pre-operative Diagnosis: Carcinomatosis, mildly elevated CA-125   Post-operative Diagnosis: same, At least stage IIIC gyn malignancy   Operation: Diagnostic laparoscopy, peritoneal biopsies    Surgeon: [name] Operative Findings: On EUA,  small mobile uterus. On intra-abdominal entry, carcinomatosis appreciated studding the right anterior diaphragm, bilateral liver surfaces, mesentery, pelvic peritoneum. Omental cake with measuring up to 2x3cm. Right ovary adherent to the right uterus with peritoneal studding adjacent. Frondular implants noted along right pelvic sidewall. Miliary disease along anterior cul de sac. Cul de sac covered with friable miliary disease. Small volume dark amber ascites. Specimens: left anterior cul de sac peritoneum, right pelvic sidewall peritoneal biopsy          11/15/2020 Cancer Staging   Staging form: Ovary, Fallopian Tube, and Primary Peritoneal Carcinoma, AJCC 8th Edition - Clinical stage from 11/15/2020: FIGO Stage III (cT3, cN0, cM0) - Signed by [name], MD on 11/15/2020 Stage prefix: Initial diagnosis   11/26/2020 -  Chemotherapy    Patient is on Treatment Plan: OVARIAN CARBOPLATIN (AUC 6) / PACLITAXEL (175) Q21D X 6 CYCLES       11/26/2020 Tumor Marker   Patient's tumor was tested for the following markers: CA-125. Results of the tumor marker test revealed 76.8.   12/17/2020 Tumor Marker   Patient's tumor was tested for the following markers: CA-125. Results of the tumor marker test revealed 58.1.   01/07/2021 Tumor Marker   Patient's tumor was tested for the following markers: CA-125. Results of the tumor marker test revealed 21.6.     Interval History: Patient presents for treatment planning today.  She notes doing very well.  She has tolerated chemotherapy almost without any side effects besides hair loss and some joint pain several days after chemo.  She reports normal bowel and bladder function.  She endorses a good appetite without nausea or  emesis.  Past Medical/Surgical History: Past Medical History:  Diagnosis Date   Anemia    Cancer ([place])    Osteoporosis    Polyp of colon, unspecified part of colon, unspecified type    Benign    Past Surgical History:  Procedure  Laterality Date   APPENDECTOMY  02/27/2007   lsc   BILATERAL SALPINGECTOMY Right 03/1978   right ectopic, tied other tube   CATARACT EXTRACTION Right 10/31/2011   Dr. Katy Fitch   COLONOSCOPY  03/2019   Dr. Alessandra Bevels   COLONOSCOPY WITH PROPOFOL N/A 04/01/2018   Procedure: COLONOSCOPY WITH PROPOFOL;  Surgeon: Otis Brace, MD;  Location: [facility];  Service: Gastroenterology;  Laterality: N/A;   DILATION AND CURETTAGE OF UTERUS     x3, 2 for miscarriages, one for polyp vs fibroid   HAND / FINGER LESION EXCISION Left    lt. index finger   LAPAROSCOPY N/A 11/11/2020   Procedure: LAPAROSCOPY DIAGNOSTIC WITH PERITONEAL BIOPSY;  Surgeon: [name], MD;  Location: [facility];  Service: Gynecology;  Laterality: N/A;   POLYPECTOMY  04/01/2018   Procedure: POLYPECTOMY;  Surgeon: Otis Brace, MD;  Location: [facility];  Service: Gastroenterology;;   RETINAL TEAR REPAIR CRYOTHERAPY Right 04/25/2011   Dr. Dominic Pea INJECTION  04/01/2018   Procedure: SUBMUCOSAL INJECTION;  Surgeon: Otis Brace, MD;  Location: [facility];  Service: Gastroenterology;;   TUBAL LIGATION Left 03/1978    Family History  Problem Relation Age of Onset   Lung cancer Mother    Skin cancer Father    Breast cancer Daughter    Ovarian cancer Neg Hx    Endometrial cancer Neg Hx    Prostate cancer Neg Hx    Pancreatic cancer Neg Hx    Colon cancer Neg Hx     Social History   Socioeconomic History   Marital status: Legally Separated    Spouse name: Not on file   Number of children: Not on file   Years of education: Not on file   Highest education level: Not on file  Occupational History   Not on file  Tobacco Use   Smoking status: Never    Passive exposure: Past (18 years)   Smokeless tobacco: Never  Vaping Use   Vaping Use: Never used  Substance and Sexual Activity   Alcohol use: Yes    Comment: Very occaionally   Drug use: Never   Sexual activity: Not Currently  Other  Topics Concern   Not on file  Social History Narrative   Not on file   Social Determinants of Health   Financial Resource Strain: Not on file  Food Insecurity: Not on file  Transportation Needs: Not on file  Physical Activity: Not on file  Stress: Not on file  Social Connections: Not on file    Current Medications:  Current Outpatient Medications:    [START ON 02/25/2021] enoxaparin (LOVENOX) 40 MG/0.4ML injection, Inject 0.4 mLs (40 mg total) into the skin daily for 14 doses. For AFTER surgery only, begin the day after surgery, Disp: 5.6 mL, Rfl: 0   [START ON 02/23/2021] erythromycin base (E-MYCIN) 500 MG tablet, Take two tablets at 2 pm, 3 pm, and 10 pm the day before surgery, Disp: 6 tablet, Rfl: 0   [START ON 02/23/2021] neomycin (MYCIFRADIN) 500 MG tablet, Take two tablets at 2 pm, 3 pm, and 10 pm the day before surgery, Disp: 6 tablet, Rfl: 0   [START ON 02/23/2021] polyethylene glycol (GOLYTELY) 236 g solution,  Take 2,000 mLs by mouth once for 1 dose. For bowel prep the day before surgery, drink half, Disp: 2000 mL, Rfl: 0   Biotin 5000 MCG TABS, Take 5,000 mcg by mouth daily., Disp: , Rfl:    Calcium Carbonate-Vitamin D 600-400 MG-UNIT tablet, Take 1 tablet by mouth daily., Disp: , Rfl:    Cholecalciferol 50 MCG (2000 UT) CAPS, Take 2,000 Units by mouth daily., Disp: , Rfl:    dexamethasone (DECADRON) 4 MG tablet, Take 2 tabs at the night before and 2 tab the morning of chemotherapy, every 3 weeks, by mouth x 6 cycles, Disp: 36 tablet, Rfl: 6   Multiple Vitamin (MULTIVITAMIN WITH MINERALS) TABS tablet, Take 1 tablet by mouth daily. WITH BIOTIN, Disp: , Rfl:    ondansetron (ZOFRAN) 8 MG tablet, Take 1 tablet (8 mg total) by mouth every 8 (eight) hours as needed for refractory nausea / vomiting., Disp: 30 tablet, Rfl: 1   prochlorperazine (COMPAZINE) 10 MG tablet, Take 1 tablet (10 mg total) by mouth every 6 (six) hours as needed (Nausea or vomiting)., Disp: 30 tablet, Rfl: 1    risedronate (ACTONEL) 150 MG tablet, Take 150 mg by mouth every 30 (thirty) days., Disp: , Rfl:    senna-docusate (SENOKOT-S) 8.6-50 MG tablet, Take 2 tablets by mouth at bedtime. For AFTER surgery, do not take if having diarrhea, Disp: 30 tablet, Rfl: 0   traMADol (ULTRAM) 50 MG tablet, Take 1 tablet (50 mg total) by mouth every 6 (six) hours as needed for severe pain. For AFTER surgery only, do not take and drive, Disp: 10 tablet, Rfl: 0   tretinoin (RETIN-A) 0.025 % cream, Apply 1 application topically at bedtime as needed (blemishes)., Disp: , Rfl:   Review of Systems: Denies appetite changes, fevers, chills, fatigue, unexplained weight changes. Denies hearing loss, neck lumps or masses, mouth sores, ringing in ears or voice changes. Denies cough or wheezing.  Denies shortness of breath. Denies chest pain or palpitations. Denies leg swelling. Denies abdominal distention, pain, blood in stools, constipation, diarrhea, nausea, vomiting, or early satiety. Denies pain with intercourse, dysuria, frequency, hematuria or incontinence. Denies hot flashes, pelvic pain, vaginal bleeding or vaginal discharge.   Denies back pain or muscle pain/cramps. Denies itching, rash, or wounds. Denies dizziness, headaches, numbness or seizures. Denies swollen lymph nodes or glands, denies easy bruising or bleeding. Denies anxiety, depression, confusion, or decreased concentration.  Physical Exam: BP 132/60 (BP Location: Left Arm, Patient Position: Sitting)   Pulse 77   Temp 98.3 F (36.8 C) (Oral)   Resp 16   Ht _0  (1.626 m)   Wt 116 lb (52.6 kg)   SpO2 100%   BMI 19.91 kg/m  General: Alert, oriented, no acute distress. HEENT: Normocephalic, atraumatic, sclera anicteric. Chest: Clear to auscultation bilaterally.  No wheezes or rhonchi. Cardiovascular: Regular rate and rhythm, no murmurs. Abdomen: soft, nontender.  Normoactive bowel sounds.  No masses or hepatosplenomegaly appreciated.  Well-healed  incisions. Extremities: Grossly normal range of motion.  Warm, well perfused.  No edema bilaterally. Skin: No rashes or lesions noted. Lymphatics: No cervical, supraclavicular, or inguinal adenopathy. GU: Normal appearing external genitalia without erythema, excoriation, or lesions. Bimanual exam reveals small moderately mobile uterus, some nodularity within the cul-de-sac and tethering of the rectum.  Rectovaginal exam confirms findings.  Laboratory & Radiologic Studies: Component Ref Range & Units 2 wk ago  (01/07/21) 1 mo ago  (12/17/20) 1 mo ago  (11/26/20) 2 mo ago  (11/02/20)  Cancer  Antigen (CA) 125 0.0 - 38.1 U/mL 21.6  58.1 High  CM  76.8 High  CM  88.2 High     CT A/P: IMPRESSION: Decreased omental soft tissue nodularity and caking, consistent with interval improvement in carcinoma.   No evidence of new or progressive disease within the abdomen or pelvis.   Colonic diverticulosis. No radiographic evidence of diverticulitis.   Large stool burden noted; recommend clinical correlation for possible constipation.  Assessment & Plan: Robin Sellers is a 70 y.o. woman with stage IIIc GYN malignancy, presumed primary peritoneal, presenting for treatment discussion.  Patient is done quite well with neoadjuvant chemotherapy.  She has had normalization of her CA-125.  Most recent CT scan shows improvement in her omental involvement.  On her exam today, I still feel some disease in the pelvic cul-de-sac and I am concerned that her rectum is tethered.  I am recommending that we proceed with a fourth cycle of neoadjuvant therapy followed by interval debulking surgery.  We discussed the risk of colon resection with planned reanastomosis.  For this reason, I would like the patient to do a bowel prep prior to surgery.  Patient will come next Friday for cycle 4.  We have tentatively set a surgery date for November 3.  We discussed plan for a robotic assisted hysterectomy, bilateral  salpingo-oophorectomy, omentectomy, mini lap, possible laparotomy, possible tumor debulking, possible bowel resection. The risks of surgery were discussed in detail and she understands these to include infection; wound separation; hernia; vaginal cuff separation, injury to adjacent organs such as bowel, bladder, blood vessels, ureters and nerves; bleeding which may require blood transfusion; anesthesia risk; thromboembolic events; possible death; unforeseen complications; possible need for re-exploration; medical complications such as heart attack, stroke, pleural effusion and pneumonia. The patient will receive DVT and antibiotic prophylaxis as indicated. She voiced a clear understanding. She had the opportunity to ask questions. Perioperative instructions were reviewed with her. Prescriptions for post-op medications were sent to her pharmacy of choice.  42 minutes of total time was spent for this patient encounter, including preparation, face-to-face counseling with the patient and coordination of care, and documentation of the encounter.  Jeral Pinch, MD  Division of Gynecologic Oncology  Department of Obstetrics and Gynecology  [facility]

## 2021-01-21 NOTE — Progress Notes (Signed)
Patient here for follow up with Dr. Jeral Pinch and for a pre-operative discussion prior to her scheduled surgery on February 24, 2021. She is scheduled for robotic assisted total laparoscopic hysterectomy, bilateral salpingo-oophorectomy, omentectomy, mini laparotomy, possible laparotomy, possible bowel resection. The surgery was discussed in detail.  See after visit summary for additional details. Visual aids used to discuss items related to surgery including sequential compression stockings, foley catheter, IV pump, multi-modal pain regimen including tylenol, photo of the surgical robot, female reproductive system to discuss surgery in detail.      Discussed post-op pain management in detail including the aspects of the enhanced recovery pathway. She has her medications from her previous surgery at home in adequate supply including tramadol, ibuprofen, and sennakot-S. We discussed her pre-operative bowel prep in detail and handout given in the AVS. Pre-operative antibiotics including neomycin and erythromycin prescribed along with golytely.  We also discussed the use of lovenox post-operatively. If performed robotically, the recommendation would be for 2 weeks of lovenox post-operatively (4 weeks for laparotomy). Handout given on administration and visual demonstration performed.   Discussed the use of heparin pre-op, SCDs, and measures to take at home to prevent DVT including frequent mobility.  Reportable signs and symptoms of DVT discussed. Post-operative instructions discussed and expectations for after surgery. Incisional care discussed as well including reportable signs and symptoms including erythema, drainage, wound separation.     10 minutes spent with the patient.  Verbalizing understanding of material discussed. No needs or concerns voiced at the end of the visit.   Advised patient to call for any needs. The plan is for the patient to receive one more cycle of chemotherapy under the care of  Dr. Alvy Bimler then surgery after.   This appointment is included in the global surgical bundle as pre-operative teaching and has no charge.

## 2021-01-21 NOTE — Patient Instructions (Signed)
Preparing for your Surgery   Plan for surgery on February 24, 2021 with Dr. Jeral Pinch at [facility] will be scheduled for robotic assisted total laparoscopic hysterectomy (removal of the uterus and cervix), bilateral salpingo-oophorectomy (removal of both ovaries and fallopian tubes), omentectomy (removal of the omentum), mini laparotomy (slightly larger incision), possible laparotomy (larger incision if needed), possible bowel resection.    Pre-operative Testing -You will receive a phone call from presurgical testing at [facility] to arrange for a pre-operative appointment and lab work.   -Bring your insurance card, copy of an advanced directive if applicable, medication list   -At that visit, you will be asked to sign a consent for a possible blood transfusion in case a transfusion becomes necessary during surgery.  The need for a blood transfusion is rare but having consent is a necessary part of your care.      -You should not be taking blood thinners or aspirin at least ten days prior to surgery unless instructed by your surgeon.   -Do not take supplements such as fish oil (omega 3), red yeast rice, turmeric before your surgery. You want to avoid medications with aspirin in them including headache powders such as BC or Goody's), Excedrin migraine.   Day Before Surgery at [facility]   If your bowels are filled with gas, your surgeon will have difficulty visualizing your pelvic organs which increases your surgical risks.   Your role in recovery Your role is to become active as soon as directed by your doctor, while still giving yourself time to heal.  Rest when you feel tired. You will be asked to do the following in order to speed your recovery:   - Cough and breathe deeply. This helps to clear and expand your lungs and can prevent pneumonia after surgery.  - [place]. Do mild physical activity. Walking or moving your legs help  your circulation and body functions return to normal. Do not try to get up or walk alone the first time after surgery.   -If you develop swelling on one leg or the other, pain in the back of your leg, redness/warmth in one of your legs, please call the office or go to the Emergency Room to have a doppler to rule out a blood clot. For shortness of breath, chest pain-seek care in the Emergency Room as soon as possible. - Actively manage your pain. Managing your pain lets you move in comfort. We will ask you to rate your pain on a scale of zero to 10. It is your responsibility to tell your doctor or nurse where and how much you hurt so your pain can be treated.   Special Considerations -If you are diabetic, you may be placed on insulin after surgery to have closer control over your blood sugars to promote healing and recovery.  This does not mean that you will be discharged on insulin.  If applicable, your oral antidiabetics will be resumed when you are tolerating a solid diet.   -Your final pathology results from surgery should be available around one week after surgery and the results will be relayed to you when available.   -Dr. Lahoma Crocker is the surgeon that assists your GYN Oncologist with surgery.  If you end up staying the night, the next day after your surgery you will either see Dr. Denman George, Dr. Berline Lopes, or Dr. Lahoma Crocker.   -FMLA forms can be faxed to [phone]  and please allow 5-7 business days for completion.   Pain Management After Surgery -You have been prescribed your pain medication and bowel regimen medications before surgery so that you can have these available when you are discharged from the hospital. The pain medication is for use ONLY AFTER surgery and a new prescription will not be given.    -Make sure that you have Tylenol and Ibuprofen at home to use on a regular basis after surgery for pain control. We recommend alternating the medications every hour to six  hours since they work differently and are processed in the body differently for pain relief.   -Review the attached handout on narcotic use and their risks and side effects.    Bowel Regimen -You have been prescribed Sennakot-S to take nightly to prevent constipation especially if you are taking the narcotic pain medication intermittently.  It is important to prevent constipation and drink adequate amounts of liquids. You can stop taking this medication when you are not taking pain medication and you are back on your normal bowel routine.   Risks of Surgery Risks of surgery are low but include bleeding, infection, damage to surrounding structures, re-operation, blood clots, and very rarely death.     Blood Transfusion Information (For the consent to be signed before surgery)   We will be checking your blood type before surgery so in case of emergencies, we will know what type of blood you would need.                                             WHAT IS A BLOOD TRANSFUSION?   A transfusion is the replacement of blood or some of its parts. Blood is made up of multiple cells which provide different functions. Red blood cells carry oxygen and are used for blood loss replacement. White blood cells fight against infection. Platelets control bleeding. Plasma helps clot blood. Other blood products are available for specialized needs, such as hemophilia or other clotting disorders. BEFORE THE TRANSFUSION  Who gives blood for transfusions?  You may be able to donate blood to be used at a later date on yourself (autologous donation). Relatives can be asked to donate blood. This is generally not any safer than if you have received blood from a stranger. The same precautions are taken to ensure safety when a relative's blood is donated. Healthy volunteers who are fully evaluated to make sure their blood is safe. This is blood bank blood. Transfusion therapy is the safest it has ever been in the  practice of medicine. Before blood is taken from a donor, a complete history is taken to make sure that person has no history of diseases nor engages in risky social behavior (examples are intravenous drug use or sexual activity with multiple partners). The donor's travel history is screened to minimize risk of transmitting infections, such as malaria. The donated blood is tested for signs of infectious diseases, such as HIV and hepatitis. The blood is then tested to be sure it is compatible with you in order to minimize the chance of a transfusion reaction. If you or a relative donates blood, this is often done in anticipation of surgery and is not appropriate for emergency situations. It takes many days to process the donated blood. RISKS AND COMPLICATIONS Although transfusion therapy is very safe and saves many lives, the main dangers of  transfusion include:  Getting an infectious disease. Developing a transfusion reaction. This is an allergic reaction to something in the blood you were given. Every precaution is taken to prevent this. The decision to have a blood transfusion has been considered carefully by your caregiver before blood is given. Blood is not given unless the benefits outweigh the risks.   AFTER SURGERY INSTRUCTIONS   Return to work: 6 weeks if applicable   You will have lovenox injections after surgery that are given once a day for 2 weeks if your surgery is able to be done robotically. If you have open surgery, you would have 4 weeks of the injections to prevent blood clots.    You will have a white honeycomb dressing over your larger incision. This dressing can be removed 5 days after surgery and you do not need to reapply a new dressing. Once you remove the dressing, you will notice that you have the surgical glue (dermabond) on the incision and this will peel off on its own. You can get this dressing wet in the shower the days after surgery prior to removal on the 5th day.     Activity: 1. Be up and out of the bed during the day.  Take a nap if needed.  You may walk up steps but be careful and use the hand rail.  Stair climbing will tire you more than you think, you may need to stop part way and rest.    2. No lifting or straining for 6 weeks over 10 pounds. No pushing, pulling, straining for 6 weeks.   3. No driving for 1 week(s).  Do not drive if you are taking narcotic pain medicine and make sure that your reaction time has returned.    4. You can shower as soon as the next day after surgery. Shower daily.  Use your regular soap and water (not directly on the incision) and pat your incision(s) dry afterwards; don't rub.  No tub baths or submerging your body in water until cleared by your surgeon. If you have the soap that was given to you by pre-surgical testing that was used before surgery, you do not need to use it afterwards because this can irritate your incisions.    5. No sexual activity and nothing in the vagina for 8 weeks.   6. You may experience a small amount of clear drainage from your incisions, which is normal.  If the drainage persists, increases, or changes color please call the office.   7. Do not use creams, lotions, or ointments such as neosporin on your incisions after surgery until advised by your surgeon because they can cause removal of the dermabond glue on your incisions.     8. You may experience vaginal spotting after surgery or around the 6-8 week mark from surgery when the stitches at the top of the vagina begin to dissolve.  The spotting is normal but if you experience heavy bleeding, call our office.   9. Take Tylenol or ibuprofen first for pain and only use narcotic pain medication for severe pain not relieved by the Tylenol or Ibuprofen.  Monitor your Tylenol intake to a max of 4,000 mg in a 24 hour period. You can alternate these medications after surgery.   Diet: 1. Low sodium Heart Healthy Diet is recommended but you are cleared  to resume your normal (before surgery) diet after your procedure.   2. It is safe to use a laxative, such as Miralax or  Colace, if you have difficulty moving your bowels. You have been prescribed Sennakot at bedtime every evening to keep bowel movements regular and to prevent constipation.     Wound Care: 1. Keep clean and dry.  Shower daily.   Reasons to call the Doctor: Fever - Oral temperature greater than 100.4 degrees Fahrenheit Foul-smelling vaginal discharge Difficulty urinating Nausea and vomiting Increased pain at the site of the incision that is unrelieved with pain medicine. Difficulty breathing with or without chest pain New calf pain especially if only on one side Sudden, continuing increased vaginal bleeding with or without clots.   Contacts: For questions or concerns you should contact:   Dr. Jeral Pinch at [phone]   Joylene John, NP at [phone]   After Hours: call [phone] and have the GYN Oncologist paged/contacted (after 5 pm or on the weekends).

## 2021-01-28 ENCOUNTER — Inpatient Hospital Stay (HOSPITAL_BASED_OUTPATIENT_CLINIC_OR_DEPARTMENT_OTHER): Payer: Medicare Other | Admitting: Hematology and Oncology

## 2021-01-28 ENCOUNTER — Other Ambulatory Visit: Payer: Self-pay

## 2021-01-28 ENCOUNTER — Encounter: Payer: Self-pay | Admitting: Hematology and Oncology

## 2021-01-28 ENCOUNTER — Inpatient Hospital Stay: Payer: Medicare Other

## 2021-01-28 ENCOUNTER — Inpatient Hospital Stay: Payer: Medicare Other | Attending: Gynecologic Oncology

## 2021-01-28 DIAGNOSIS — M25562 Pain in left knee: Secondary | ICD-10-CM | POA: Insufficient documentation

## 2021-01-28 DIAGNOSIS — M25561 Pain in right knee: Secondary | ICD-10-CM

## 2021-01-28 DIAGNOSIS — C482 Malignant neoplasm of peritoneum, unspecified: Secondary | ICD-10-CM | POA: Insufficient documentation

## 2021-01-28 DIAGNOSIS — Z5111 Encounter for antineoplastic chemotherapy: Secondary | ICD-10-CM | POA: Diagnosis not present

## 2021-01-28 LAB — CBC WITH DIFFERENTIAL (CANCER CENTER ONLY)
Abs Immature Granulocytes: 0.03 10*3/uL (ref 0.00–0.07)
Basophils Absolute: 0 10*3/uL (ref 0.0–0.1)
Basophils Relative: 0 %
Eosinophils Absolute: 0 10*3/uL (ref 0.0–0.5)
Eosinophils Relative: 0 %
HCT: 39.5 % (ref 36.0–46.0)
Hemoglobin: 13.5 g/dL (ref 12.0–15.0)
Immature Granulocytes: 1 %
Lymphocytes Relative: 13 %
Lymphs Abs: 0.9 10*3/uL (ref 0.7–4.0)
MCH: 29.8 pg (ref 26.0–34.0)
MCHC: 34.2 g/dL (ref 30.0–36.0)
MCV: 87.2 fL (ref 80.0–100.0)
Monocytes Absolute: 0.2 10*3/uL (ref 0.1–1.0)
Monocytes Relative: 3 %
Neutro Abs: 5.3 10*3/uL (ref 1.7–7.7)
Neutrophils Relative %: 83 %
Platelet Count: 184 10*3/uL (ref 150–400)
RBC: 4.53 MIL/uL (ref 3.87–5.11)
RDW: 13.2 % (ref 11.5–15.5)
WBC Count: 6.3 10*3/uL (ref 4.0–10.5)
nRBC: 0 % (ref 0.0–0.2)

## 2021-01-28 LAB — CMP (CANCER CENTER ONLY)
ALT: 15 U/L (ref 0–44)
AST: 19 U/L (ref 15–41)
Albumin: 4.3 g/dL (ref 3.5–5.0)
Alkaline Phosphatase: 45 U/L (ref 38–126)
Anion gap: 11 (ref 5–15)
BUN: 20 mg/dL (ref 8–23)
CO2: 24 mmol/L (ref 22–32)
Calcium: 9.7 mg/dL (ref 8.9–10.3)
Chloride: 104 mmol/L (ref 98–111)
Creatinine: 0.88 mg/dL (ref 0.44–1.00)
GFR, Estimated: >60 mL/min (ref >60)
Glucose, Bld: 160 mg/dL — ABNORMAL HIGH (ref 70–99)
Potassium: 4.1 mmol/L (ref 3.5–5.1)
Sodium: 139 mmol/L (ref 135–145)
Total Bilirubin: 0.4 mg/dL (ref 0.3–1.2)
Total Protein: 7.5 g/dL (ref 6.5–8.1)

## 2021-01-28 MED ORDER — SODIUM CHLORIDE 0.9 % IV SOLN
10.0000 mg | Freq: Once | INTRAVENOUS | Status: AC
  Administered 2021-01-28: 10 mg via INTRAVENOUS
  Filled 2021-01-28: qty 10

## 2021-01-28 MED ORDER — FAMOTIDINE 20 MG IN NS 100 ML IVPB
20.0000 mg | Freq: Once | INTRAVENOUS | Status: AC
  Administered 2021-01-28: 20 mg via INTRAVENOUS
  Filled 2021-01-28: qty 100

## 2021-01-28 MED ORDER — SODIUM CHLORIDE 0.9 % IV SOLN
175.0000 mg/m2 | Freq: Once | INTRAVENOUS | Status: AC
  Administered 2021-01-28: 264 mg via INTRAVENOUS
  Filled 2021-01-28: qty 44

## 2021-01-28 MED ORDER — SODIUM CHLORIDE 0.9 % IV SOLN: Freq: Once | INTRAVENOUS | Status: AC

## 2021-01-28 MED ORDER — DIPHENHYDRAMINE HCL 50 MG/ML IJ SOLN
25.0000 mg | Freq: Once | INTRAMUSCULAR | Status: AC
  Administered 2021-01-28: 25 mg via INTRAVENOUS
  Filled 2021-01-28: qty 1

## 2021-01-28 MED ORDER — SODIUM CHLORIDE 0.9 % IV SOLN
406.2000 mg | Freq: Once | INTRAVENOUS | Status: AC
  Administered 2021-01-28: 410 mg via INTRAVENOUS
  Filled 2021-01-28: qty 41

## 2021-01-28 MED ORDER — PALONOSETRON HCL INJECTION 0.25 MG/5ML
0.2500 mg | Freq: Once | INTRAVENOUS | Status: AC
  Administered 2021-01-28: 0.25 mg via INTRAVENOUS
  Filled 2021-01-28: qty 5

## 2021-01-28 MED ORDER — SODIUM CHLORIDE 0.9 % IV SOLN
150.0000 mg | Freq: Once | INTRAVENOUS | Status: AC
  Administered 2021-01-28: 150 mg via INTRAVENOUS
  Filled 2021-01-28: qty 150

## 2021-01-28 NOTE — Assessment & Plan Note (Signed)
Her recent CT imaging show significant positive response to therapy After today's treatment, she will proceed with surgery next month I plan to see her back within the month after surgery to resume chemotherapy

## 2021-01-28 NOTE — Patient Instructions (Signed)
[facility]  Discharge Instructions: Thank you for choosing [facility] to provide your oncology and hematology care.   If you have a lab appointment with the Cancer Center, please go directly to the Cancer Center and check in at the registration area.   Wear comfortable clothing and clothing appropriate for easy access to any Portacath or PICC line.   We strive to give you quality time with your provider. You may need to reschedule your appointment if you arrive late (15 or more minutes).  Arriving late affects you and other patients whose appointments are after yours.  Also, if you miss three or more appointments without notifying the office, you may be dismissed from the clinic at the provider's discretion.      For prescription refill requests, have your pharmacy contact our office and allow 72 hours for refills to be completed.    Today you received the following chemotherapy and/or immunotherapy agents: Paclitaxel (Taxol) and Carboplatin.   To help prevent nausea and vomiting after your treatment, we encourage you to take your nausea medication as directed.  BELOW ARE SYMPTOMS THAT SHOULD BE REPORTED IMMEDIATELY: *FEVER GREATER THAN 100.4 F (38 C) OR HIGHER *CHILLS OR SWEATING *NAUSEA AND VOMITING THAT IS NOT CONTROLLED WITH YOUR NAUSEA MEDICATION *UNUSUAL SHORTNESS OF BREATH *UNUSUAL BRUISING OR BLEEDING *URINARY PROBLEMS (pain or burning when urinating, or frequent urination) *BOWEL PROBLEMS (unusual diarrhea, constipation, pain near the anus) TENDERNESS IN MOUTH AND THROAT WITH OR WITHOUT PRESENCE OF ULCERS (sore throat, sores in mouth, or a toothache) UNUSUAL RASH, SWELLING OR PAIN  UNUSUAL VAGINAL DISCHARGE OR ITCHING   Items with * indicate a potential emergency and should be followed up as soon as possible or go to the Emergency Department if any problems should occur.  Please show the CHEMOTHERAPY ALERT CARD or IMMUNOTHERAPY  ALERT CARD at check-in to the Emergency Department and triage nurse.  Should you have questions after your visit or need to cancel or reschedule your appointment, please contact [facility]  Dept: [phone]  and follow the prompts.  Office hours are 8:00 a.m. to 4:30 p.m. Monday - Friday. Please note that voicemails left after 4:00 p.m. may not be returned until the following business day.  We are closed weekends and major holidays. You have access to a nurse at all times for urgent questions. Please call the main number to the clinic Dept: [phone] and follow the prompts.   For any non-urgent questions, you may also contact your provider using MyChart. We now offer e-Visits for anyone 18 and older to request care online for non-urgent symptoms. For details visit [email or web address].   Also download the MyChart app! Go to the app store, search "MyChart", open the app, select [facility], and log in with your MyChart username and password.  Due to Covid, a mask is required upon entering the hospital/clinic. If you do not have a mask, one will be given to you upon arrival. For doctor visits, patients may have 1 support person aged 18 or older with them. For treatment visits, patients cannot have anyone with them due to current Covid guidelines and our immunocompromised population.   

## 2021-01-28 NOTE — Assessment & Plan Note (Signed)
She has diffuse arthralgias after treatment I recommend conservative approach with acetaminophen as needed

## 2021-01-28 NOTE — Progress Notes (Signed)
[place] OFFICE PROGRESS NOTE  Patient Care Team: Lorene Dy, MD as PCP - General (Internal Medicine)  ASSESSMENT & PLAN:  Primary peritoneal carcinomatosis [facility]) Her recent CT imaging show significant positive response to therapy After today's treatment, she will proceed with surgery next month I plan to see her back within the month after surgery to resume chemotherapy  Arthralgia of both knees She has diffuse arthralgias after treatment I recommend conservative approach with acetaminophen as needed  No orders of the defined types were placed in this encounter.   All questions were answered. The patient knows to call the clinic with any problems, questions or concerns. The total time spent in the appointment was 20 minutes encounter with patients including review of chart and various tests results, discussions about plan of care and coordination of care plan   [name], MD 01/28/2021 9:24 AM  INTERVAL HISTORY: Please see below for problem oriented charting. she returns for treatment follow-up for cycle 4 of chemotherapy of carboplatin and paclitaxel She is doing well She denies peripheral neuropathy She has mild bilateral knee pain on days 3-5 after chemotherapy that usually resolve conservatively without taking medications She continues to walk on a regular basis No recent infection  REVIEW OF SYSTEMS:   Constitutional: Denies fevers, chills or abnormal weight loss Eyes: Denies blurriness of vision Ears, nose, mouth, throat, and face: Denies mucositis or sore throat Respiratory: Denies cough, dyspnea or wheezes Cardiovascular: Denies palpitation, chest discomfort or lower extremity swelling Gastrointestinal:  Denies nausea, heartburn or change in bowel habits Skin: Denies abnormal skin rashes Lymphatics: Denies new lymphadenopathy or easy bruising Neurological:Denies numbness, tingling or new weaknesses Behavioral/Psych: Mood is stable, no new  changes  All other systems were reviewed with the patient and are negative.  I have reviewed the past medical history, past surgical history, social history and family history with the patient and they are unchanged from previous note.  ALLERGIES:  is allergic to artichoke [cynara scolymus (artichoke)].  MEDICATIONS:  Current Outpatient Medications  Medication Sig Dispense Refill   Biotin 5000 MCG TABS Take 5,000 mcg by mouth daily.     Calcium Carbonate-Vitamin D 600-400 MG-UNIT tablet Take 1 tablet by mouth daily.     Cholecalciferol 50 MCG (2000 UT) CAPS Take 2,000 Units by mouth daily.     dexamethasone (DECADRON) 4 MG tablet Take 2 tabs at the night before and 2 tab the morning of chemotherapy, every 3 weeks, by mouth x 6 cycles 36 tablet 6   [START ON 02/25/2021] enoxaparin (LOVENOX) 40 MG/0.4ML injection Inject 0.4 mLs (40 mg total) into the skin daily for 14 doses. For AFTER surgery only, begin the day after surgery 5.6 mL 0   [START ON 02/23/2021] erythromycin base (E-MYCIN) 500 MG tablet Take two tablets at 2 pm, 3 pm, and 10 pm the day before surgery 6 tablet 0   Multiple Vitamin (MULTIVITAMIN WITH MINERALS) TABS tablet Take 1 tablet by mouth daily. WITH BIOTIN     [START ON 02/23/2021] neomycin (MYCIFRADIN) 500 MG tablet Take two tablets at 2 pm, 3 pm, and 10 pm the day before surgery 6 tablet 0   ondansetron (ZOFRAN) 8 MG tablet Take 1 tablet (8 mg total) by mouth every 8 (eight) hours as needed for refractory nausea / vomiting. 30 tablet 1   [START ON 02/23/2021] polyethylene glycol (GOLYTELY) 236 g solution Take 2,000 mLs by mouth once for 1 dose. For bowel prep the day before surgery, drink half  2000 mL 0   prochlorperazine (COMPAZINE) 10 MG tablet Take 1 tablet (10 mg total) by mouth every 6 (six) hours as needed (Nausea or vomiting). 30 tablet 1   risedronate (ACTONEL) 150 MG tablet Take 150 mg by mouth every 30 (thirty) days.     senna-docusate (SENOKOT-S) 8.6-50 MG tablet Take 2  tablets by mouth at bedtime. For AFTER surgery, do not take if having diarrhea 30 tablet 0   traMADol (ULTRAM) 50 MG tablet Take 1 tablet (50 mg total) by mouth every 6 (six) hours as needed for severe pain. For AFTER surgery only, do not take and drive 10 tablet 0   tretinoin (RETIN-A) 0.025 % cream Apply 1 application topically at bedtime as needed (blemishes).     No current facility-administered medications for this visit.   Facility-Administered Medications Ordered in Other Visits  Medication Dose Route Frequency Provider Last Rate Last Admin   CARBOplatin (PARAPLATIN) 410 mg in sodium chloride 0.9 % 250 mL chemo infusion  410 mg Intravenous Once Alvy Bimler, Pearson Picou, MD       dexamethasone (DECADRON) 10 mg in sodium chloride 0.9 % 50 mL IVPB  10 mg Intravenous Once Alvy Bimler, Takerra Lupinacci, MD       diphenhydrAMINE (BENADRYL) injection 25 mg  25 mg Intravenous Once Alvy Bimler, Curlie Sittner, MD       famotidine (PEPCID) IVPB 20 mg in NS 100 mL IVPB  20 mg Intravenous Once Alvy Bimler, Heath Badon, MD       fosaprepitant (EMEND) 150 mg in sodium chloride 0.9 % 145 mL IVPB  150 mg Intravenous Once Alvy Bimler, Charmane Protzman, MD       PACLitaxel (TAXOL) 264 mg in sodium chloride 0.9 % 250 mL chemo infusion (> 3m/m2)  175 mg/m2 (Treatment Plan Recorded) Intravenous Once GAlvy Bimler Shadee Montoya, MD       palonosetron (ALOXI) injection 0.25 mg  0.25 mg Intravenous Once GAlvy Bimler Fielding Mault, MD        SUMMARY OF ONCOLOGIC HISTORY: Oncology History  Primary peritoneal carcinomatosis ([facility]  08/21/2020 Initial Diagnosis   The patient reports intermittent right upper quadrant pain that feel like she pulled a muscle beginning in March 2022.  In April, she woke up with sharp pain in her right upper quadrant.  This was her first episode of sharp pain.  She thought that her pain was related to her gallbladder and presented to the emergency department.  She was seen in ED on 4/30. RUQ ultrasound and x-ray were normal as were LFTs and lipase.    10/27/2020 Imaging   Findings highly  suspicious for peritoneal carcinomatosis. No site of primary malignancy identified.   Colonic diverticulosis, without radiographic evidence of diverticulitis   11/02/2020 Initial Diagnosis   Primary peritoneal carcinomatosis ([facility]   11/02/2020 Tumor Marker   Patient's tumor was tested for the following markers: CA-125. Results of the tumor marker test revealed 88.   11/11/2020 Pathology Results   FINAL MICROSCOPIC DIAGNOSIS:   A. CUL DE SAC, LEFT ANTERIOR, EXCISION:  -  Poorly differentiated carcinoma  -  See comment   B. SIDEWALL, RIGHT, BIOPSY:  -  Poorly differentiated carcinoma  -  See comment   COMMENT:   By immunohistochemistry, the neoplastic cells are positive for cytokeratin 7, p53, PAX8 and WT1 but negative for cytokeratin 20, GATA3, CDX2 and TTF-1.  Overall, the immunophenotype is consistent with a  gynecologic primary.    11/11/2020 Pathology Results   FINAL MICROSCOPIC DIAGNOSIS:  - Malignant cells consistent with metastatic adenocarcinoma    11/11/2020  Surgery   Pre-operative Diagnosis: Carcinomatosis, mildly elevated CA-125   Post-operative Diagnosis: same, At least stage IIIC gyn malignancy   Operation: Diagnostic laparoscopy, peritoneal biopsies    Surgeon: [name] Operative Findings: On EUA, small mobile uterus. On intra-abdominal entry, carcinomatosis appreciated studding the right anterior diaphragm, bilateral liver surfaces, mesentery, pelvic peritoneum. Omental cake with measuring up to 2x3cm. Right ovary adherent to the right uterus with peritoneal studding adjacent. Frondular implants noted along right pelvic sidewall. Miliary disease along anterior cul de sac. Cul de sac covered with friable miliary disease. Small volume dark amber ascites. Specimens: left anterior cul de sac peritoneum, right pelvic sidewall peritoneal biopsy          11/15/2020 Cancer Staging   Staging form: Ovary, Fallopian Tube, and Primary Peritoneal Carcinoma, AJCC 8th  Edition - Clinical stage from 11/15/2020: FIGO Stage III (cT3, cN0, cM0) - Signed by [name], MD on 11/15/2020 Stage prefix: Initial diagnosis   11/26/2020 -  Chemotherapy   Patient is on Treatment Plan : OVARIAN Carboplatin (AUC 6) / Paclitaxel (175) q21d x 6 cycles     11/26/2020 Tumor Marker   Patient's tumor was tested for the following markers: CA-125. Results of the tumor marker test revealed 76.8.   12/17/2020 Tumor Marker   Patient's tumor was tested for the following markers: CA-125. Results of the tumor marker test revealed 58.1.   01/07/2021 Tumor Marker   Patient's tumor was tested for the following markers: CA-125. Results of the tumor marker test revealed 21.6.   01/20/2021 Imaging   Decreased omental soft tissue nodularity and caking, consistent with interval improvement in carcinoma.   No evidence of new or progressive disease within the abdomen or pelvis.   Colonic diverticulosis. No radiographic evidence of diverticulitis.   Large stool burden noted; recommend clinical correlation for possible constipation     PHYSICAL EXAMINATION: ECOG PERFORMANCE STATUS: 1 - Symptomatic but completely ambulatory  Vitals:   01/28/21 0816  BP: (!) 142/63  Pulse: 85  Temp: 97.8 F (36.6 C)  SpO2: 100%   Filed Weights   01/28/21 0816  Weight: 115 lb 6.4 oz (52.3 kg)    GENERAL:alert, no distress and comfortable SKIN: skin color, texture, turgor are normal, no rashes or significant lesions EYES: normal, Conjunctiva are pink and non-injected, sclera clear OROPHARYNX:no exudate, no erythema and lips, buccal mucosa, and tongue normal  NECK: supple, thyroid normal size, non-tender, without nodularity LYMPH:  no palpable lymphadenopathy in the cervical, axillary or inguinal LUNGS: clear to auscultation and percussion with normal breathing effort HEART: regular rate & rhythm and no murmurs and no lower extremity edema ABDOMEN:abdomen soft, non-tender and normal bowel  sounds Musculoskeletal:no cyanosis of digits and no clubbing  NEURO: alert & oriented x 3 with fluent speech, no focal motor/sensory deficits  LABORATORY DATA:  I have reviewed the data as listed    Component Value Date/Time   NA 139 01/28/2021 0808   K 4.1 01/28/2021 0808   CL 104 01/28/2021 0808   CO2 24 01/28/2021 0808   GLUCOSE 160 (H) 01/28/2021 0808   BUN 20 01/28/2021 0808   CREATININE 0.88 01/28/2021 0808   CALCIUM 9.7 01/28/2021 0808   PROT 7.5 01/28/2021 0808   ALBUMIN 4.3 01/28/2021 0808   AST 19 01/28/2021 0808   ALT 15 01/28/2021 0808   ALKPHOS 45 01/28/2021 0808   BILITOT 0.4 01/28/2021 0808   GFRNONAA >60 01/28/2021 0808   GFRAA  02/27/2007 1510    >60  The eGFR has been calculated using the MDRD equation. This calculation has not been validated in all clinical    No results found for: SPEP, UPEP  Lab Results  Component Value Date   WBC 6.3 01/28/2021   NEUTROABS 5.3 01/28/2021   HGB 13.5 01/28/2021   HCT 39.5 01/28/2021   MCV 87.2 01/28/2021   PLT 184 01/28/2021      Chemistry      Component Value Date/Time   NA 139 01/28/2021 0808   K 4.1 01/28/2021 0808   CL 104 01/28/2021 0808   CO2 24 01/28/2021 0808   BUN 20 01/28/2021 0808   CREATININE 0.88 01/28/2021 0808      Component Value Date/Time   CALCIUM 9.7 01/28/2021 0808   ALKPHOS 45 01/28/2021 0808   AST 19 01/28/2021 0808   ALT 15 01/28/2021 0808   BILITOT 0.4 01/28/2021 0808       RADIOGRAPHIC STUDIES: I have personally reviewed the radiological images as listed and agreed with the findings in the report. CT ABDOMEN PELVIS W CONTRAST  Result Date: 01/20/2021 CLINICAL DATA:  Peritoneal carcinoma.  Assess treatment response. EXAM: CT ABDOMEN AND PELVIS WITH CONTRAST TECHNIQUE: Multidetector CT imaging of the abdomen and pelvis was performed using the standard protocol following bolus administration of intravenous contrast. CONTRAST:  9m OMNIPAQUE IOHEXOL 350 MG/ML SOLN  COMPARISON:  10/27/2020 FINDINGS: Lower Chest: No acute findings. Hepatobiliary: No hepatic masses identified. Gallbladder is unremarkable. No evidence of biliary ductal dilatation. Pancreas:  No mass or inflammatory changes. Spleen: Within normal limits in size and appearance. Adrenals/Urinary Tract: No masses identified. No evidence of ureteral calculi or hydronephrosis. Stomach/Bowel: No evidence of obstruction, inflammatory process or abnormal fluid collections. Large colonic stool burden noted which is increased since prior exam. Diverticulosis is seen mainly involving the sigmoid colon, however there is no evidence of diverticulitis. Vascular/Lymphatic: No pathologically enlarged lymph nodes. No acute vascular findings. Reproductive:  Unremarkable.  No masses identified. Other: Decreased soft tissue caking and nodularity is seen within the mid and left abdominal omental fat since previous study, consistent with decreased carcinoma. No new or increased sites of abnormal soft tissue density seen. No evidence of ascites. Musculoskeletal:  No suspicious bone lesions identified. IMPRESSION: Decreased omental soft tissue nodularity and caking, consistent with interval improvement in carcinoma. No evidence of new or progressive disease within the abdomen or pelvis. Colonic diverticulosis. No radiographic evidence of diverticulitis. Large stool burden noted; recommend clinical correlation for possible constipation. Electronically Signed   By: JMarlaine HindM.D.   On: 01/20/2021 13:45

## 2021-02-02 ENCOUNTER — Telehealth: Payer: Self-pay | Admitting: *Deleted

## 2021-02-02 NOTE — Telephone Encounter (Signed)
Spoke with the patient and moved her appt from 11/10 to 11/9

## 2021-02-11 NOTE — Progress Notes (Addendum)
COVID swab appointment: 02-22-21  COVID Vaccine Completed:  Yes x4 Date COVID Vaccine completed:    05-16-19 06-06-19 Has received booster:  Yes x2 01-21-20 11-03-20 COVID vaccine manufacturer: [place]      Date of COVID positive in last 90 days:  N/A  PCP - Lorene Dy, MD Cardiologist - N/A  Chest x-ray - 08-21-20 Epic EKG - N/A Stress Test - N/A ECHO - N/A Cardiac Cath - N/A Pacemaker/ICD device last checked: Spinal Cord Stimulator:  Sleep Study - N/A CPAP -   Fasting Blood Sugar - N/A Checks Blood Sugar _____ times a day  Blood Thinner Instructions:N/A Aspirin Instructions: Last Dose:  Activity level:  Can go up a flight of stairs and perform activities of daily living without stopping and without symptoms of chest pain or shortness of breath.   Able to exercise without symptoms, walks 3 to 4 miles a day.   Anesthesia review: N/A  Patient denies shortness of breath, fever, cough and chest pain at PAT appointment   Patient verbalized understanding of instructions that were given to them at the PAT appointment. Patient was also instructed that they will need to review over the PAT instructions again at home before surgery.

## 2021-02-11 NOTE — Patient Instructions (Addendum)
DUE TO COVID-19 ONLY ONE VISITOR IS ALLOWED TO COME WITH YOU AND STAY IN THE WAITING ROOM ONLY DURING PRE OP AND PROCEDURE.   **NO VISITORS ARE ALLOWED IN THE SHORT STAY AREA OR RECOVERY ROOM!!**  IF YOU WILL BE ADMITTED INTO THE HOSPITAL YOU ARE ALLOWED ONLY TWO SUPPORT PEOPLE DURING VISITATION HOURS ONLY (7 AM -8PM)    Up to two visitors ages 71+ are allowed at one time in a patient's room.  The visitors may rotate out with other people throughout the day.  Additionally, up to two children between the ages of 6 and 42 are allowed and do not count toward the number of allowed visitors.  Children within this age range must be accompanied by an adult visitor.  One adult visitor may remain with the patient overnight and must be in the room by 8 PM.  COVID SWAB TESTING MUST BE COMPLETED ON:  Tuesday, 02-22-21, Between the hours of 8 and 3  **MUST PRESENT COMPLETED FORM AT TESTING SITE**    [place] (backside of the building)  You are not required to quarantine, however you are required to wear a well-fitted mask when you are out and around people not in your household.  Hand Hygiene often Do NOT share personal items Notify your provider if you are in close contact with someone who has COVID or you develop fever 100.4 or greater, new onset of sneezing, cough, sore throat, shortness of breath or body aches.        Your procedure is scheduled on:  Thursday, 02-24-21   Report to [facility] Main  Entrance     Report to admitting at 5:15 AM   Call this number if you have problems the morning of surgery [phone]  [place] supplies for the bowel prep at a pharmacy of your choice:  Office-e-mailed prescriptions for your antibiotic pills (Neomycin & Erythromycin)  Golytely at the pharmacy       Change your diet to make the bowel prep go more easily:  Switch to a bland, low fiber diet  Stop eating  any nuts, popcorn, or fruit with seeds.  Stop all fiber supplements such as Metamucil, Miralax, etc.       Improve nutrition:  Consider drinking 2-3 nutritional shakes (Ex: Ensure Surgery) every day, starting 5 days prior to surgery       DAY PRIOR TO SURGERY     Switch to a full liquid diet the day before surgery  Drink plenty of liquids all day to avoid getting dehydrated       12:00pm  Drink 1/2 a jug of golytely.   (You should finish in 2 hours)     2:00pm  Take 2 Neomycin 500mg  tablets & 2 Erythromycin 500mg  tablets     3:00pm  Take 2 Neomycin 500mg  tablets & 2 Erythromycin 500mg  tablets  Drink plenty of clear liquids all evening to avoid getting dehydrated     10:00pm  Take 2 Neomycin 500mg  tablets & 2 Erythromycin 500mg  tablets  Drink 2 Carbohydrate loading nutrition drinks (ex: Ensure Presurgery). These will be given at pre-op appointment.  Do not eat anything solid after bedtime (midnight) the night before your surgery.    BUT DO drink plenty of clear liquids (Water, Gatorade, juice, soda, coffee, tea, broths, etc.) up to 3 hours prior to surgery to avoid getting dehydrated.  MORNING OF SURGERY     Remember to not to eat anything solid that morning  Drink one final carbohydrate loading nutritional drink (ex: Ensure Presurgery) upon waking up in the morning (needs to be 3 hours before your surgery).  Hold or take medications as recommended by the hospital staff at your Preoperative visit  Stop drinking liquids before you leave the house (>3 hours prior to surgery)     May have liquids until 4:30 AM day of surgery  CLEAR LIQUID DIET  Foods Allowed                                                                     Foods Excluded  Water, Black Coffee (no milk/no creamer) and tea, regular and decaf                              liquids that you cannot  Plain Jell-O in any flavor  (No red)                         see through such as: Fruit ices (not with fruit  pulp)                                 milk, soups, orange juice  Iced Popsicles (No red)                                    All solid food                             Apple juices Sports drinks like Gatorade (No red) Lightly seasoned clear broth or consume(fat free) Sugar Sample Menu Breakfast                                Lunch                                     Supper Cranberry juice                    Beef broth                            Chicken broth Jell-O                                     Grape juice                           Apple juice Coffee or tea                        Jell-O  Popsicle                                                Coffee or tea                        Coffee or tea       Drink 2 Ensure drinks the night before surgery.    Complete one Ensure drink the morning of surgery 3 hours prior to scheduled surgery by 4:30 AM     The day of surgery:  Drink ONE (1) Pre-Surgery Clear Ensure the morning of surgery. Drink in one sitting. Do not sip.  This drink was given to you during your hospital  pre-op appointment visit. Nothing else to drink after completing the Pre-Surgery Clear Ensure.          If you have questions, please contact your surgeon's office.     Oral Hygiene is also important to reduce your risk of infection.                                    Remember - BRUSH YOUR TEETH THE MORNING OF SURGERY WITH YOUR REGULAR TOOTHPASTE   Do NOT smoke after Midnight   Take these medicines the morning of surgery with A SIP OF WATER:  Compazine and Tramadol if needed.   Stop all vitamins and herbal supplements a week before surgery             You may not have any metal on your body including hair pins, jewelry, and body piercing             Do not wear make-up, lotions, powders, perfume or deodorant  Do not wear nail polish including gel and S&S, artificial/acrylic nails, or any other type of covering on natural nails  including finger and toenails. If you have artificial nails, gel coating, etc. that needs to be removed by a nail salon please have this removed prior to surgery or surgery may need to be canceled/ delayed if the surgeon/ anesthesia feels like they are unable to be safely monitored.   Do not shave  48 hours prior to surgery.        Do not bring valuables to the hospital. [place].   Contacts, dentures or bridgework may not be worn into surgery.   Bring small overnight bag day of surgery.   Please read over the following fact sheets you were given: IF YOU HAVE QUESTIONS ABOUT YOUR PRE OP INSTRUCTIONS PLEASE CALL [facility] - Preparing for Surgery Before surgery, you can play an important role.  Because skin is not sterile, your skin needs to be as free of germs as possible.  You can reduce the number of germs on your skin by washing with CHG (chlorahexidine gluconate) soap before surgery.  CHG is an antiseptic cleaner which kills germs and bonds with the skin to continue killing germs even after washing. Please DO NOT use if you have an allergy to CHG or antibacterial soaps.  If your skin becomes reddened/irritated stop using the CHG and inform your nurse when you arrive at Short Stay. Do not shave (including legs and underarms) for at least 48 hours  prior to the first CHG shower.  You may shave your face/neck.  Please follow these instructions carefully:  1.  Shower with CHG Soap the night before surgery and the  morning of surgery.  2.  If you choose to wash your hair, wash your hair first as usual with your normal  shampoo.  3.  After you shampoo, rinse your hair and body thoroughly to remove the shampoo.                             4.  Use CHG as you would any other liquid soap.  You can apply chg directly to the skin and wash.  Gently with a scrungie or clean washcloth.  5.  Apply the CHG Soap to your body ONLY FROM THE NECK DOWN.    Do   not use on face/ open                           Wound or open sores. Avoid contact with eyes, ears mouth and   genitals (private parts).                       Wash face,  Genitals (private parts) with your normal soap.             6.  Wash thoroughly, paying special attention to the area where your    surgery  will be performed.  7.  Thoroughly rinse your body with warm water from the neck down.  8.  DO NOT shower/wash with your normal soap after using and rinsing off the CHG Soap.                9.  Pat yourself dry with a clean towel.            10.  Wear clean pajamas.            11.  Place clean sheets on your bed the night of your first shower and do not  sleep with pets. Day of Surgery : Do not apply any lotions/deodorants the morning of surgery.  Please wear clean clothes to the hospital/surgery center.  FAILURE TO FOLLOW THESE INSTRUCTIONS MAY RESULT IN THE CANCELLATION OF YOUR SURGERY  PATIENT SIGNATURE_________________________________  NURSE SIGNATURE__________________________________  ________________________________________________________________________  WHAT IS A BLOOD TRANSFUSION? Blood Transfusion Information  A transfusion is the replacement of blood or some of its parts. Blood is made up of multiple cells which provide different functions. Red blood cells carry oxygen and are used for blood loss replacement. White blood cells fight against infection. Platelets control bleeding. Plasma helps clot blood. Other blood products are available for specialized needs, such as hemophilia or other clotting disorders. BEFORE THE TRANSFUSION  Who gives blood for transfusions?  Healthy volunteers who are fully evaluated to make sure their blood is safe. This is blood bank blood. Transfusion therapy is the safest it has ever been in the practice of medicine. Before blood is taken from a donor, a complete history is taken to make sure that person has no history of diseases nor  engages in risky social behavior (examples are intravenous drug use or sexual activity with multiple partners). The donor's travel history is screened to minimize risk of transmitting infections, such as malaria. The donated blood is tested for signs of infectious diseases, such as HIV and hepatitis. The blood is then tested  to be sure it is compatible with you in order to minimize the chance of a transfusion reaction. If you or a relative donates blood, this is often done in anticipation of surgery and is not appropriate for emergency situations. It takes many days to process the donated blood. RISKS AND COMPLICATIONS Although transfusion therapy is very safe and saves many lives, the main dangers of transfusion include:  Getting an infectious disease. Developing a transfusion reaction. This is an allergic reaction to something in the blood you were given. Every precaution is taken to prevent this. The decision to have a blood transfusion has been considered carefully by your caregiver before blood is given. Blood is not given unless the benefits outweigh the risks. AFTER THE TRANSFUSION Right after receiving a blood transfusion, you will usually feel much better and more energetic. This is especially true if your red blood cells have gotten low (anemic). The transfusion raises the level of the red blood cells which carry oxygen, and this usually causes an energy increase. The nurse administering the transfusion will monitor you carefully for complications. HOME CARE INSTRUCTIONS  No special instructions are needed after a transfusion. You may find your energy is better. Speak with your caregiver about any limitations on activity for underlying diseases you may have. SEEK MEDICAL CARE IF:  Your condition is not improving after your transfusion. You develop redness or irritation at the intravenous (IV) site. SEEK IMMEDIATE MEDICAL CARE IF:  Any of the following symptoms occur over the next 12  hours: Shaking chills. You have a temperature by mouth above 102 F (38.9 C), not controlled by medicine. Chest, back, or muscle pain. People around you feel you are not acting correctly or are confused. Shortness of breath or difficulty breathing. Dizziness and fainting. You get a rash or develop hives. You have a decrease in urine output. Your urine turns a dark color or changes to pink, red, or brown. Any of the following symptoms occur over the next 10 days: You have a temperature by mouth above 102 F (38.9 C), not controlled by medicine. Shortness of breath. Weakness after normal activity. The white part of the eye turns yellow (jaundice). You have a decrease in the amount of urine or are urinating less often. Your urine turns a dark color or changes to pink, red, or brown. Document Released: 04/07/2000 Document Revised: 07/03/2011 Document Reviewed: 11/25/2007 [facility] Patient Information 2014 [place], [place].  _______________________________________________________________________

## 2021-02-14 ENCOUNTER — Encounter (HOSPITAL_COMMUNITY): Payer: Self-pay

## 2021-02-14 ENCOUNTER — Other Ambulatory Visit: Payer: Self-pay

## 2021-02-14 ENCOUNTER — Encounter (HOSPITAL_COMMUNITY)
Admission: RE | Admit: 2021-02-14 | Discharge: 2021-02-14 | Disposition: A | Discharge status: Home / Self Care | Payer: Medicare Other | Source: Ambulatory Visit | Attending: Gynecologic Oncology | Admitting: Gynecologic Oncology

## 2021-02-14 DIAGNOSIS — C482 Malignant neoplasm of peritoneum, unspecified: Secondary | ICD-10-CM | POA: Insufficient documentation

## 2021-02-14 DIAGNOSIS — Z01812 Encounter for preprocedural laboratory examination: Secondary | ICD-10-CM | POA: Diagnosis not present

## 2021-02-14 HISTORY — DX: Round hole, unspecified eye: H33.329

## 2021-02-14 LAB — URINALYSIS, ROUTINE W REFLEX MICROSCOPIC
Bilirubin Urine: NEGATIVE
Glucose, UA: NEGATIVE mg/dL
Hgb urine dipstick: NEGATIVE
Ketones, ur: NEGATIVE mg/dL
Leukocytes,Ua: NEGATIVE
Nitrite: NEGATIVE
Protein, ur: NEGATIVE mg/dL
Specific Gravity, Urine: 1.016 (ref 1.005–1.030)
pH: 5 (ref 5.0–8.0)

## 2021-02-17 ENCOUNTER — Telehealth: Payer: Self-pay

## 2021-02-17 NOTE — Telephone Encounter (Signed)
Received return call from Ms. Sawaya. Patient states she checked and it is only $2 more to have the prescription filled at [facility]. Patient is agreeable to having prescription filled with [facility].  Instructed patient that prescription will be sent over today and to call if she has any questions or concerns. Patient verbalized understanding.

## 2021-02-17 NOTE — Telephone Encounter (Signed)
Spoke with Robin Sellers regarding her prescription for neomycin. [facility] is unable to fill her prescription due to supply issues. [facility] is [facility] with [facility] thru Dec/Jan. Informed patient that our office reached out to [facility], they have a different supplier and are able to fill the prescription.   Patient would like to check her Good Rx to see if she can get the prescription cheaper somewhere else due to [facility] being more expensive with her insurance. Patient will call back today with alternate pharmacies.

## 2021-02-18 ENCOUNTER — Ambulatory Visit: Payer: Medicare Other | Admitting: Hematology and Oncology

## 2021-02-18 ENCOUNTER — Ambulatory Visit: Payer: Medicare Other

## 2021-02-18 ENCOUNTER — Other Ambulatory Visit: Payer: Self-pay | Admitting: Gynecologic Oncology

## 2021-02-18 ENCOUNTER — Telehealth: Payer: Self-pay | Admitting: *Deleted

## 2021-02-18 ENCOUNTER — Other Ambulatory Visit: Payer: Medicare Other

## 2021-02-18 DIAGNOSIS — C482 Malignant neoplasm of peritoneum, unspecified: Secondary | ICD-10-CM

## 2021-02-18 MED ORDER — NEOMYCIN SULFATE 500 MG PO TABS: ORAL_TABLET | ORAL | 0 refills | Status: DC

## 2021-02-18 NOTE — Progress Notes (Signed)
See RN note. Neomycin reordered to be sent to [facility] since [facility] does not have a supply of the medication.

## 2021-02-18 NOTE — Telephone Encounter (Signed)
Patient called and stated that her medicine was not called into [facility]. Explain that the medicine was not called in, but [facility] is placing the order now.

## 2021-02-22 ENCOUNTER — Other Ambulatory Visit: Payer: Self-pay | Admitting: Gynecologic Oncology

## 2021-02-22 LAB — SARS CORONAVIRUS 2 (TAT 6-24 HRS): SARS Coronavirus 2: NEGATIVE

## 2021-02-23 ENCOUNTER — Telehealth: Payer: Self-pay

## 2021-02-23 ENCOUNTER — Encounter (HOSPITAL_COMMUNITY): Payer: Self-pay | Admitting: Gynecologic Oncology

## 2021-02-23 NOTE — Telephone Encounter (Signed)
Telephone call to check on pre-operative status.  Patient compliant with pre-operative instructions.  Patient on full liquid diet today and has started bowel prep.  No questions or concerns voiced.  Instructed to call for any needs.

## 2021-02-24 ENCOUNTER — Inpatient Hospital Stay (HOSPITAL_COMMUNITY): Payer: Medicare Other | Admitting: Anesthesiology

## 2021-02-24 ENCOUNTER — Inpatient Hospital Stay (HOSPITAL_COMMUNITY)
Admission: RE | Admit: 2021-02-24 | Discharge: 2021-02-24 | DRG: 737 | Disposition: A | Discharge status: Home / Self Care | Payer: Medicare Other | Source: Ambulatory Visit | Attending: Gynecologic Oncology | Admitting: Gynecologic Oncology

## 2021-02-24 ENCOUNTER — Encounter (HOSPITAL_COMMUNITY): Payer: Self-pay | Admitting: Gynecologic Oncology

## 2021-02-24 ENCOUNTER — Encounter (HOSPITAL_COMMUNITY)
Admission: RE | Disposition: A | Discharge status: Home / Self Care | Payer: Self-pay | Source: Ambulatory Visit | Attending: Gynecologic Oncology

## 2021-02-24 DIAGNOSIS — N736 Female pelvic peritoneal adhesions (postinfective): Secondary | ICD-10-CM | POA: Diagnosis present

## 2021-02-24 DIAGNOSIS — Z801 Family history of malignant neoplasm of trachea, bronchus and lung: Secondary | ICD-10-CM

## 2021-02-24 DIAGNOSIS — Z803 Family history of malignant neoplasm of breast: Secondary | ICD-10-CM

## 2021-02-24 DIAGNOSIS — Z9221 Personal history of antineoplastic chemotherapy: Secondary | ICD-10-CM

## 2021-02-24 DIAGNOSIS — M810 Age-related osteoporosis without current pathological fracture: Secondary | ICD-10-CM | POA: Diagnosis present

## 2021-02-24 DIAGNOSIS — Z808 Family history of malignant neoplasm of other organs or systems: Secondary | ICD-10-CM

## 2021-02-24 DIAGNOSIS — C563 Malignant neoplasm of bilateral ovaries: Principal | ICD-10-CM | POA: Diagnosis present

## 2021-02-24 DIAGNOSIS — C482 Malignant neoplasm of peritoneum, unspecified: Secondary | ICD-10-CM

## 2021-02-24 DIAGNOSIS — C481 Malignant neoplasm of specified parts of peritoneum: Secondary | ICD-10-CM | POA: Diagnosis present

## 2021-02-24 DIAGNOSIS — C786 Secondary malignant neoplasm of retroperitoneum and peritoneum: Secondary | ICD-10-CM | POA: Diagnosis present

## 2021-02-24 DIAGNOSIS — R1011 Right upper quadrant pain: Secondary | ICD-10-CM | POA: Diagnosis present

## 2021-02-24 LAB — COMPREHENSIVE METABOLIC PANEL
ALT: 18 U/L (ref 0–44)
AST: 24 U/L (ref 15–41)
Albumin: 4 g/dL (ref 3.5–5.0)
Alkaline Phosphatase: 35 U/L — ABNORMAL LOW (ref 38–126)
Anion gap: 7 (ref 5–15)
BUN: 14 mg/dL (ref 8–23)
CO2: 27 mmol/L (ref 22–32)
Calcium: 8.7 mg/dL — ABNORMAL LOW (ref 8.9–10.3)
Chloride: 105 mmol/L (ref 98–111)
Creatinine, Ser: 0.57 mg/dL (ref 0.44–1.00)
GFR, Estimated: >60 mL/min (ref >60)
Glucose, Bld: 91 mg/dL (ref 70–99)
Potassium: 4.1 mmol/L (ref 3.5–5.1)
Sodium: 139 mmol/L (ref 135–145)
Total Bilirubin: 0.5 mg/dL (ref 0.3–1.2)
Total Protein: 6.5 g/dL (ref 6.5–8.1)

## 2021-02-24 LAB — TYPE AND SCREEN
ABO/RH(D): A POS
Antibody Screen: NEGATIVE

## 2021-02-24 LAB — CBC
HCT: 36.9 % (ref 36.0–46.0)
Hemoglobin: 12.3 g/dL (ref 12.0–15.0)
MCH: 30.4 pg (ref 26.0–34.0)
MCHC: 33.3 g/dL (ref 30.0–36.0)
MCV: 91.1 fL (ref 80.0–100.0)
Platelets: 144 10*3/uL — ABNORMAL LOW (ref 150–400)
RBC: 4.05 MIL/uL (ref 3.87–5.11)
RDW: 13.9 % (ref 11.5–15.5)
WBC: 6.4 10*3/uL (ref 4.0–10.5)
nRBC: 0 % (ref 0.0–0.2)

## 2021-02-24 SURGERY — XI ROBOTIC ASSISTED TOTAL HYSTERECTOMY BILATERAL SALPINGO OOPHORECTOMY WITH OMENTECTOMY AND DEBULKING
Anesthesia: General | Site: Abdomen | Laterality: Bilateral

## 2021-02-24 MED ORDER — ROCURONIUM BROMIDE 10 MG/ML (PF) SYRINGE
PREFILLED_SYRINGE | INTRAVENOUS | Status: AC
  Filled 2021-02-24: qty 10

## 2021-02-24 MED ORDER — LIDOCAINE 2% (20 MG/ML) 5 ML SYRINGE
INTRAMUSCULAR | Status: DC | PRN
  Administered 2021-02-24: 60 mg via INTRAVENOUS
  Administered 2021-02-24: 40 mg via INTRAVENOUS

## 2021-02-24 MED ORDER — FENTANYL CITRATE PF 50 MCG/ML IJ SOSY
25.0000 ug | PREFILLED_SYRINGE | INTRAMUSCULAR | Status: DC | PRN
  Administered 2021-02-24 (×2): 25 ug via INTRAVENOUS
  Administered 2021-02-24: 50 ug via INTRAVENOUS

## 2021-02-24 MED ORDER — EPHEDRINE 5 MG/ML INJ
INTRAVENOUS | Status: AC
  Filled 2021-02-24: qty 5

## 2021-02-24 MED ORDER — HEPARIN SODIUM (PORCINE) 5000 UNIT/ML IJ SOLN
5000.0000 [IU] | INTRAMUSCULAR | Status: AC
  Administered 2021-02-24: 5000 [IU] via SUBCUTANEOUS
  Filled 2021-02-24: qty 1

## 2021-02-24 MED ORDER — OXYCODONE HCL 5 MG PO TABS
5.0000 mg | ORAL_TABLET | Freq: Once | ORAL | Status: AC | PRN
  Administered 2021-02-24: 5 mg via ORAL

## 2021-02-24 MED ORDER — MIDAZOLAM HCL 5 MG/5ML IJ SOLN
INTRAMUSCULAR | Status: DC | PRN
  Administered 2021-02-24: 1 mg via INTRAVENOUS

## 2021-02-24 MED ORDER — BUPIVACAINE LIPOSOME 1.3 % IJ SUSP
INTRAMUSCULAR | Status: DC | PRN
  Administered 2021-02-24: 20 mL

## 2021-02-24 MED ORDER — ACETAMINOPHEN 500 MG PO TABS
1000.0000 mg | ORAL_TABLET | ORAL | Status: AC
  Administered 2021-02-24: 1000 mg via ORAL
  Filled 2021-02-24: qty 2

## 2021-02-24 MED ORDER — LIDOCAINE HCL (PF) 2 % IJ SOLN
INTRAMUSCULAR | Status: AC
  Filled 2021-02-24: qty 5

## 2021-02-24 MED ORDER — PROPOFOL 10 MG/ML IV BOLUS
INTRAVENOUS | Status: DC | PRN
  Administered 2021-02-24: 130 mg via INTRAVENOUS

## 2021-02-24 MED ORDER — OXYCODONE HCL 5 MG/5ML PO SOLN: 5.0000 mg | Freq: Once | ORAL | Status: AC | PRN

## 2021-02-24 MED ORDER — BUPIVACAINE LIPOSOME 1.3 % IJ SUSP
INTRAMUSCULAR | Status: AC
  Filled 2021-02-24: qty 20

## 2021-02-24 MED ORDER — ROCURONIUM BROMIDE 10 MG/ML (PF) SYRINGE
PREFILLED_SYRINGE | INTRAVENOUS | Status: DC | PRN
  Administered 2021-02-24: 10 mg via INTRAVENOUS
  Administered 2021-02-24: 20 mg via INTRAVENOUS
  Administered 2021-02-24: 60 mg via INTRAVENOUS

## 2021-02-24 MED ORDER — PROPOFOL 10 MG/ML IV BOLUS
INTRAVENOUS | Status: AC
  Filled 2021-02-24: qty 40

## 2021-02-24 MED ORDER — EPHEDRINE SULFATE-NACL 50-0.9 MG/10ML-% IV SOSY
PREFILLED_SYRINGE | INTRAVENOUS | Status: DC | PRN
  Administered 2021-02-24: 5 mg via INTRAVENOUS
  Administered 2021-02-24: 10 mg via INTRAVENOUS
  Administered 2021-02-24: 5 mg via INTRAVENOUS

## 2021-02-24 MED ORDER — STERILE WATER FOR IRRIGATION IR SOLN
Status: DC | PRN
  Administered 2021-02-24: 1000 mL

## 2021-02-24 MED ORDER — LACTATED RINGERS IV SOLN: INTRAVENOUS | Status: DC

## 2021-02-24 MED ORDER — OXYCODONE HCL 5 MG PO TABS
ORAL_TABLET | ORAL | Status: AC
  Filled 2021-02-24: qty 1

## 2021-02-24 MED ORDER — SODIUM CHLORIDE (PF) 0.9 % IJ SOLN
INTRAMUSCULAR | Status: DC | PRN
  Administered 2021-02-24: 20 mL

## 2021-02-24 MED ORDER — MIDAZOLAM HCL 2 MG/2ML IJ SOLN
INTRAMUSCULAR | Status: AC
  Filled 2021-02-24: qty 2

## 2021-02-24 MED ORDER — SODIUM CHLORIDE (PF) 0.9 % IJ SOLN
INTRAMUSCULAR | Status: AC
  Filled 2021-02-24: qty 20

## 2021-02-24 MED ORDER — ACETAMINOPHEN 325 MG PO TABS: 325.0000 mg | ORAL_TABLET | ORAL | Status: DC | PRN

## 2021-02-24 MED ORDER — LACTATED RINGERS IR SOLN
Status: DC | PRN
  Administered 2021-02-24: 1000 mL

## 2021-02-24 MED ORDER — ENSURE PRE-SURGERY PO LIQD: 592.0000 mL | Freq: Once | ORAL | Status: DC

## 2021-02-24 MED ORDER — ENSURE PRE-SURGERY PO LIQD: 296.0000 mL | Freq: Once | ORAL | Status: DC

## 2021-02-24 MED ORDER — SUGAMMADEX SODIUM 200 MG/2ML IV SOLN
INTRAVENOUS | Status: DC | PRN
  Administered 2021-02-24: 150 mg via INTRAVENOUS

## 2021-02-24 MED ORDER — ACETAMINOPHEN 160 MG/5ML PO SOLN: 325.0000 mg | ORAL | Status: DC | PRN

## 2021-02-24 MED ORDER — DEXAMETHASONE SODIUM PHOSPHATE 10 MG/ML IJ SOLN
INTRAMUSCULAR | Status: AC
  Filled 2021-02-24: qty 1

## 2021-02-24 MED ORDER — ACETAMINOPHEN 10 MG/ML IV SOLN: 1000.0000 mg | Freq: Once | INTRAVENOUS | Status: DC | PRN

## 2021-02-24 MED ORDER — DEXAMETHASONE SODIUM PHOSPHATE 10 MG/ML IJ SOLN
INTRAMUSCULAR | Status: DC | PRN
  Administered 2021-02-24: 8 mg via INTRAVENOUS

## 2021-02-24 MED ORDER — ONDANSETRON HCL 4 MG/2ML IJ SOLN
INTRAMUSCULAR | Status: AC
  Filled 2021-02-24: qty 2

## 2021-02-24 MED ORDER — CHLORHEXIDINE GLUCONATE 0.12 % MT SOLN
15.0000 mL | Freq: Once | OROMUCOSAL | Status: AC
  Administered 2021-02-24: 15 mL via OROMUCOSAL

## 2021-02-24 MED ORDER — PROMETHAZINE HCL 25 MG/ML IJ SOLN: 6.2500 mg | INTRAMUSCULAR | Status: DC | PRN

## 2021-02-24 MED ORDER — CEFAZOLIN SODIUM-DEXTROSE 2-4 GM/100ML-% IV SOLN
2.0000 g | INTRAVENOUS | Status: DC
  Filled 2021-02-24: qty 100

## 2021-02-24 MED ORDER — ORAL CARE MOUTH RINSE: 15.0000 mL | Freq: Once | OROMUCOSAL | Status: AC

## 2021-02-24 MED ORDER — LACTATED RINGERS IV SOLN: INTRAVENOUS | Status: DC | PRN

## 2021-02-24 MED ORDER — ONDANSETRON HCL 4 MG/2ML IJ SOLN
INTRAMUSCULAR | Status: DC | PRN
  Administered 2021-02-24: 4 mg via INTRAVENOUS

## 2021-02-24 MED ORDER — DEXAMETHASONE SODIUM PHOSPHATE 4 MG/ML IJ SOLN: 4.0000 mg | INTRAMUSCULAR | Status: DC

## 2021-02-24 MED ORDER — FENTANYL CITRATE (PF) 100 MCG/2ML IJ SOLN
INTRAMUSCULAR | Status: DC | PRN
  Administered 2021-02-24: 50 ug via INTRAVENOUS
  Administered 2021-02-24: 100 ug via INTRAVENOUS
  Administered 2021-02-24: 50 ug via INTRAVENOUS

## 2021-02-24 MED ORDER — FENTANYL CITRATE (PF) 250 MCG/5ML IJ SOLN
INTRAMUSCULAR | Status: AC
  Filled 2021-02-24: qty 5

## 2021-02-24 MED ORDER — AMISULPRIDE (ANTIEMETIC) 5 MG/2ML IV SOLN: 10.0000 mg | Freq: Once | INTRAVENOUS | Status: DC | PRN

## 2021-02-24 MED ORDER — BUPIVACAINE HCL 0.25 % IJ SOLN
INTRAMUSCULAR | Status: DC | PRN
  Administered 2021-02-24: 15 mL
  Administered 2021-02-24: 35 mL

## 2021-02-24 MED ORDER — BUPIVACAINE HCL 0.25 % IJ SOLN
INTRAMUSCULAR | Status: AC
  Filled 2021-02-24: qty 1

## 2021-02-24 MED ORDER — FENTANYL CITRATE PF 50 MCG/ML IJ SOSY
PREFILLED_SYRINGE | INTRAMUSCULAR | Status: AC
  Filled 2021-02-24: qty 2

## 2021-02-24 SURGICAL SUPPLY — 78 items
ADH SKN CLS APL DERMABOND .7 (GAUZE/BANDAGES/DRESSINGS) ×2
AGENT HMST KT MTR STRL THRMB (HEMOSTASIS)
APL ESCP 34 STRL LF DISP (HEMOSTASIS)
APPLICATOR SURGIFLO ENDO (HEMOSTASIS) IMPLANT
BAG COUNTER SPONGE SURGICOUNT (BAG) IMPLANT
BAG LAPAROSCOPIC 12 15 PORT 16 (BASKET) IMPLANT
BAG RETRIEVAL 12/15 (BASKET)
BAG SPEC RTRVL LRG 6X4 10 (ENDOMECHANICALS)
BAG SPNG CNTER NS LX DISP (BAG)
BLADE SURG SZ10 CARB STEEL (BLADE) ×2 IMPLANT
COVER BACK TABLE 60X90IN (DRAPES) ×3 IMPLANT
COVER TIP SHEARS 8 DVNC (MISCELLANEOUS) ×2 IMPLANT
COVER TIP SHEARS 8MM DA VINCI (MISCELLANEOUS) ×3
DECANTER SPIKE VIAL GLASS SM (MISCELLANEOUS) IMPLANT
DERMABOND ADVANCED (GAUZE/BANDAGES/DRESSINGS) ×1
DERMABOND ADVANCED .7 DNX12 (GAUZE/BANDAGES/DRESSINGS) ×2 IMPLANT
DRAPE ARM DVNC X/XI (DISPOSABLE) ×8 IMPLANT
DRAPE COLUMN DVNC XI (DISPOSABLE) ×2 IMPLANT
DRAPE DA VINCI XI ARM (DISPOSABLE) ×12
DRAPE DA VINCI XI COLUMN (DISPOSABLE) ×3
DRAPE SHEET LG 3/4 BI-LAMINATE (DRAPES) ×3 IMPLANT
DRAPE SURG IRRIG POUCH 19X23 (DRAPES) ×3 IMPLANT
DRSG OPSITE POSTOP 4X6 (GAUZE/BANDAGES/DRESSINGS) ×2 IMPLANT
DRSG OPSITE POSTOP 4X8 (GAUZE/BANDAGES/DRESSINGS) IMPLANT
ELECT PENCIL ROCKER SW 15FT (MISCELLANEOUS) ×2 IMPLANT
ELECT REM PT RETURN 15FT ADLT (MISCELLANEOUS) ×3 IMPLANT
GAUZE 4X4 16PLY ~~LOC~~+RFID DBL (SPONGE) ×3 IMPLANT
GLOVE SURG ENC MOIS LTX SZ6 (GLOVE) ×12 IMPLANT
GLOVE SURG ENC MOIS LTX SZ6.5 (GLOVE) ×6 IMPLANT
GOWN STRL REUS W/ TWL LRG LVL3 (GOWN DISPOSABLE) ×8 IMPLANT
GOWN STRL REUS W/TWL LRG LVL3 (GOWN DISPOSABLE) ×12
HOLDER FOLEY CATH W/STRAP (MISCELLANEOUS) ×3 IMPLANT
IRRIG SUCT STRYKERFLOW 2 WTIP (MISCELLANEOUS) ×3
IRRIGATION SUCT STRKRFLW 2 WTP (MISCELLANEOUS) ×2 IMPLANT
KIT PROCEDURE DA VINCI SI (MISCELLANEOUS)
KIT PROCEDURE DVNC SI (MISCELLANEOUS) IMPLANT
KIT TURNOVER KIT A (KITS) IMPLANT
LIGASURE IMPACT 36 18CM CVD LR (INSTRUMENTS) ×2 IMPLANT
MANIPULATOR ADVINCU DEL 3.0 PL (MISCELLANEOUS) IMPLANT
MANIPULATOR UTERINE 4.5 ZUMI (MISCELLANEOUS) ×3 IMPLANT
NDL HYPO 21X1.5 SAFETY (NEEDLE) ×1 IMPLANT
NDL SPNL 18GX3.5 QUINCKE PK (NEEDLE) IMPLANT
NEEDLE HYPO 21X1.5 SAFETY (NEEDLE) ×6 IMPLANT
NEEDLE SPNL 18GX3.5 QUINCKE PK (NEEDLE) IMPLANT
OBTURATOR OPTICAL STANDARD 8MM (TROCAR) ×3
OBTURATOR OPTICAL STND 8 DVNC (TROCAR) ×2
OBTURATOR OPTICALSTD 8 DVNC (TROCAR) ×2 IMPLANT
PACK ROBOT GYN CUSTOM WL (TRAY / TRAY PROCEDURE) ×3 IMPLANT
PAD POSITIONING PINK XL (MISCELLANEOUS) ×3 IMPLANT
PORT ACCESS TROCAR AIRSEAL 12 (TROCAR) ×2 IMPLANT
PORT ACCESS TROCAR AIRSEAL 5M (TROCAR) ×1
POUCH SPECIMEN RETRIEVAL 10MM (ENDOMECHANICALS) IMPLANT
RTRCTR WOUND ALEXIS 18CM SML (INSTRUMENTS) ×3
SAVER CELL AAL HAEMONETICS (INSTRUMENTS) ×1 IMPLANT
SCRUB EXIDINE 4% CHG 4OZ (MISCELLANEOUS) ×3 IMPLANT
SEAL CANN UNIV 5-8 DVNC XI (MISCELLANEOUS) ×8 IMPLANT
SEAL XI 5MM-8MM UNIVERSAL (MISCELLANEOUS) ×12
SET TRI-LUMEN FLTR TB AIRSEAL (TUBING) ×3 IMPLANT
SPONGE T-LAP 18X18 ~~LOC~~+RFID (SPONGE) ×2 IMPLANT
SURGIFLO W/THROMBIN 8M KIT (HEMOSTASIS) IMPLANT
SUT MNCRL AB 4-0 PS2 18 (SUTURE) ×4 IMPLANT
SUT PDS AB 1 TP1 96 (SUTURE) ×4 IMPLANT
SUT VIC AB 0 CT1 27 (SUTURE)
SUT VIC AB 0 CT1 27XBRD ANTBC (SUTURE) IMPLANT
SUT VIC AB 2-0 CT1 27 (SUTURE) ×3
SUT VIC AB 2-0 CT1 TAPERPNT 27 (SUTURE) ×1 IMPLANT
SUT VIC AB 2-0 SH 27 (SUTURE) ×3
SUT VIC AB 2-0 SH 27X BRD (SUTURE) ×1 IMPLANT
SUT VIC AB 4-0 PS2 18 (SUTURE) ×6 IMPLANT
SUT VICRYL 4-0 PS2 18IN ABS (SUTURE) ×4 IMPLANT
SYR 10ML LL (SYRINGE) IMPLANT
TOWEL OR NON WOVEN STRL DISP B (DISPOSABLE) ×3 IMPLANT
TRAP SPECIMEN MUCUS 40CC (MISCELLANEOUS) IMPLANT
TRAY FOLEY MTR SLVR 16FR STAT (SET/KITS/TRAYS/PACK) ×3 IMPLANT
TROCAR XCEL NON-BLD 5MMX100MML (ENDOMECHANICALS) ×2 IMPLANT
UNDERPAD 30X36 HEAVY ABSORB (UNDERPADS AND DIAPERS) ×3 IMPLANT
WATER STERILE IRR 1000ML POUR (IV SOLUTION) ×3 IMPLANT
YANKAUER SUCT BULB TIP 10FT TU (MISCELLANEOUS) ×2 IMPLANT

## 2021-02-24 NOTE — Anesthesia Postprocedure Evaluation (Signed)
Anesthesia Post Note  Patient: Robin Sellers  Procedure(s) Performed: XI ROBOTIC ASSISTED TOTAL HYSTERECTOMY BILATERAL SALPINGO OOPHORECTOMY WITH OMENTECTOMY/MINI LAPAROTOMY (Bilateral: Abdomen)     Patient location during evaluation: PACU Anesthesia Type: General Level of consciousness: awake and alert Pain management: pain level controlled Vital Signs Assessment: post-procedure vital signs reviewed and stable Respiratory status: spontaneous breathing, nonlabored ventilation, respiratory function stable and patient connected to nasal cannula oxygen Cardiovascular status: blood pressure returned to baseline and stable Postop Assessment: no apparent nausea or vomiting Anesthetic complications: no   No notable events documented.  Last Vitals:  Vitals:   02/24/21 1145 02/24/21 1210  BP: (!) 143/72 140/65  Pulse: 73 74  Resp: 13   Temp:  36.6 C  SpO2: 100% 98%    Last Pain:  Vitals:   02/24/21 1210  TempSrc:   PainSc: [place]

## 2021-02-24 NOTE — Brief Op Note (Signed)
02/24/2021  10:42 AM  PATIENT:  Robin Sellers  70 y.o. female  PRE-OPERATIVE DIAGNOSIS:  PRIMARY PERITONEAL CANCER  POST-OPERATIVE DIAGNOSIS:  PRIMARY PERITONEAL CANCER  PROCEDURE:  Procedure(s): XI ROBOTIC ASSISTED TOTAL HYSTERECTOMY BILATERAL SALPINGO OOPHORECTOMY WITH OMENTECTOMY/MINI LAPAROTOMY (Bilateral)  SURGEON:  Surgeon(s) and Role:    [name], MD - Primary    Lahoma Crocker, MD - Assisting  EBL:  100 mL   BLOOD ADMINISTERED:none  DRAINS: none   LOCAL MEDICATIONS USED:  MARCAINE     SPECIMEN:  uterus, cervix, tubes and ovaries, omentum, peritoneal nodules  DISPOSITION OF SPECIMEN:  PATHOLOGY  COUNTS:  YES  TOURNIQUET:  * No tourniquets in log *  DICTATION: .Note written in EPIC  PLAN OF CARE: Discharge to home after PACU  PATIENT DISPOSITION:  PACU - hemodynamically stable.   Delay start of Pharmacological VTE agent (>24hrs) due to surgical blood loss or risk of bleeding: not applicable

## 2021-02-24 NOTE — Anesthesia Procedure Notes (Signed)
Procedure Name: Intubation Date/Time: 02/24/2021 7:50 AM Performed by: Lavina Hamman, CRNA Pre-anesthesia Checklist: Patient identified, Emergency Drugs available, Suction available, Patient being monitored and Timeout performed Patient Re-evaluated:Patient Re-evaluated prior to induction Oxygen Delivery Method: Circle system utilized Preoxygenation: Pre-oxygenation with 100% oxygen Induction Type: IV induction Ventilation: Mask ventilation without difficulty Laryngoscope Size: Mac and 3 Grade View: Grade I Tube type: Oral Tube size: 7.0 mm Number of attempts: 1 Airway Equipment and Method: Stylet Placement Confirmation: ETT inserted through vocal cords under direct vision, positive ETCO2, CO2 detector and breath sounds checked- equal and bilateral Secured at: 21 cm Tube secured with: Tape Dental Injury: Teeth and Oropharynx as per pre-operative assessment  Comments: ATOI

## 2021-02-24 NOTE — Op Note (Signed)
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   Anesthesia: GET  Urine Output: 750 cc  Operative Findings: On EUA, small mobile uterus. Rectum free. On intra-abdominal entry, normal upper abdominal survey including liver edge, diaphragm and stomach. Omentum with several small (<2cm) areas of thickening, suspected treated tumor versus residual tumor. Uterus 6cm and normal in appearance. Atrophic appearing bilateral adnexa. Some adhesions of the left ovary to the medial leaf of the broad ligament. Minimal peritoneal nodularity versus treated disease within posterior cul de sac and deep left pelvis, all either excised or ablated. No significant peritoneal disease or carcinomatosis. No sigmoid or rectal involvement. Small bowel normal in appearance. No adenopathy. No ascites. R0 resection.   Estimated Blood Loss:  less than 100 mL      Total IV Fluids: see I&O flowsheet         Specimens: uterus, cervix, bilateral tubes and ovaries, peritoneal nodules (treated disease versus small carcinomatosis), omentum         Complications:  None apparent; patient tolerated the procedure well.         Disposition: PACU - hemodynamically stable.  Procedure Details  The patient was seen in the Holding Room. The risks, benefits, complications, treatment options, and expected outcomes were discussed with the patient.  The patient concurred with the  proposed plan, giving informed consent.  The site of surgery properly noted/marked. The patient was identified as Robin Sellers and the procedure verified as a Robotic-assisted interval tumor debulking, possible laparotomy.   After induction of anesthesia, the patient was draped and prepped in the usual sterile manner. Patient was placed in supine position after anesthesia and draped and prepped in the usual sterile manner as follows: Her arms were tucked to her side with all appropriate precautions.  The shoulders were stabilized with padded shoulder blocks applied to the acromium processes.  The patient was placed in the semi-lithotomy position in [place].  The perineum and vagina were prepped with CholoraPrep. The patient was draped after the CholoraPrep had been allowed to dry for 3 minutes.  A Time Out was held and the above information confirmed.  The urethra was prepped with Betadine. Foley catheter was placed.  A sterile speculum was placed in the vagina.  The cervix was grasped with a single-tooth tenaculum. The cervix was dilated with Kennon Rounds dilators.  The ZUMI uterine manipulator with a small colpotomizer ring was placed without difficulty.  A pneum occluder balloon was placed over the manipulator.  OG tube placement was confirmed and to suction.   Next, a 10 mm skin incision was made 1 cm below the subcostal margin in the midclavicular line.  The 5 mm Optiview port and scope was used for direct entry.  Opening pressure was under 10 mm CO2.  The abdomen was insufflated and the findings were noted as above.   At this point and all points during the procedure, the patient's intra-abdominal pressure did not exceed 15 mmHg. Next, an 8 mm skin incision was made lateral and superior to the umbilicus and a right and left port  were placed about 8 cm lateral to the robot port on the right and left side.  A fourth arm was placed on the right.  The 5 mm assist trocar was exchanged for a 10-12 mm port.  All ports were placed under direct visualization.  The patient was placed in steep Trendelenburg.  Bowel was folded away into the upper abdomen.  The robot was docked in the normal manner.  The right and left peritoneum were opened parallel to the IP ligament to open the retroperitoneal spaces bilaterally. The round ligaments were transected. The ureter was noted to be on the medial leaf of the broad ligament.  The peritoneum above the ureter was incised and stretched and the infundibulopelvic ligament was skeletonized, cauterized and cut.    The posterior peritoneum was taken down to the level of the KOH ring.  The anterior peritoneum was also taken down.  The bladder flap was created to the level of the KOH ring.  The uterine artery on the right side was skeletonized, cauterized and cut in the normal manner.  A similar procedure was performed on the left.  The colpotomy was made and the uterus, cervix, bilateral ovaries and tubes were amputated and delivered through the vagina.  Pedicles were inspected and excellent hemostasis was achieved.    The colpotomy at the vaginal cuff was closed with Vicryl on a CT1 needle in a running manner.    Attention was turned to the cul de sac. A combination of excision and ablation was performed to remove/treat all small areas of the peritoneum with either carcinomatosis or treated tumor. The same was performed along the medial leaf of the broad ligament on the left after the ureter was mobilized laterally.  Irrigation was used and excellent hemostasis was achieved. Robotic instruments were removed under direct visulaization.  The robot was undocked.   With the abdomen still insufflated, the peri-umbilical incision was extended to a length of 6-8 cm with a scalpel. The incision was carried down to and through the fascia with monopolar electrocautery. The peritoneal incision was extended the length of the skin incision. The other trocars were removed.  An alexis  retractor was placed. Manual exploration of the abdominal cavity confirmed findings noted above. The omentum was then inspected and delivered through the incision. Using a combination of monopolar and bipolar electrocautery, an omentectomy was performed sparing the short gastrics. Given concern for short application of monopolar electrocautery to achieve hemostasis of a vessel near the transverse colon, a couple of interrupted sutures using 2-0 Vicryl were placed to assure hemostasis and oversew the transverse colon serosa. The omentum, once detached, was handed off the field.   The fascia at the 10-12 mm port was closed with 0 Vicryl on a UR-5 needle. The alexis retractor was removed and the mini-lap was closed with #1 looped PDS tied at the midline. The subcutaneous tissue was irrigated and exparel was injected. The subcutaneous tissue was reapproximated with 2-0 Vicryl, also burying the PDS knots.  The subcuticular tissue of all incisions was closed with 4-0 Vicryl and the skin was closed with 4-0 Monocryl in a subcuticular manner.  Dermabond was applied.    The vagina was swabbed with minimal bleeding noted. Foley catheter was removed.  All sponge, lap and needle counts were correct x  3.   The patient was transferred to the recovery room in stable condition.  Jeral Pinch, MD \

## 2021-02-24 NOTE — Anesthesia Preprocedure Evaluation (Addendum)
Anesthesia Evaluation  Patient identified by MRN, date of birth, ID band Patient awake    Reviewed: Allergy & Precautions, NPO status , Patient's Chart, lab work & pertinent test results  Airway Mallampati: I  TM Distance: >3 FB Neck ROM: Full    Dental  (+) Teeth Intact, Dental Advisory Given   Pulmonary neg pulmonary ROS,    breath sounds clear to auscultation       Cardiovascular negative cardio ROS   Rhythm:Regular Rate:Normal     Neuro/Psych negative neurological ROS  negative psych ROS   GI/Hepatic negative GI ROS, Neg liver ROS,   Endo/Other  negative endocrine ROS  Renal/GU negative Renal ROS     Musculoskeletal negative musculoskeletal ROS (+)   Abdominal Normal abdominal exam  (+)   Peds  Hematology negative hematology ROS (+)   Anesthesia Other Findings   Reproductive/Obstetrics                            Anesthesia Physical Anesthesia Plan  ASA: 3  Anesthesia Plan: General   Post-op Pain Management:    Induction: Intravenous  PONV Risk Score and Plan: 4 or greater and Ondansetron, Dexamethasone, Midazolam and Scopolamine patch - Pre-op  Airway Management Planned: Oral ETT  Additional Equipment: None  Intra-op Plan:   Post-operative Plan: Extubation in OR  Informed Consent: I have reviewed the patients History and Physical, chart, labs and discussed the procedure including the risks, benefits and alternatives for the proposed anesthesia with the patient or authorized representative who has indicated his/her understanding and acceptance.     Dental advisory given  Plan Discussed with: CRNA  Anesthesia Plan Comments: (- 2 IV's  Lab Results      Component                Value               Date                      WBC                      6.3                 01/28/2021                HGB                      13.5                01/28/2021                HCT                       39.5                01/28/2021                MCV                      87.2                01/28/2021                PLT                      184  01/28/2021           )       Anesthesia Quick Evaluation

## 2021-02-24 NOTE — Progress Notes (Signed)
0715  Lab orders (CBC and CMP) were cancelled this morning given labs done within 30 day period. Given recent chemotherapy administration and possible need for laparotomy and bowel resection, I reordered labs. I spoke with the patient's nurse as well as anesthesia. [place] for patient to proceed to OR prior to labs being resulted.  [name] Gynecologic Oncology

## 2021-02-24 NOTE — Discharge Instructions (Addendum)
02/24/2021  Activity: 1. Be up and out of the bed during the day.  Take a nap if needed.  You may walk up steps but be careful and use the hand rail.  Stair climbing will tire you more than you think, you may need to stop part way and rest.   2. No lifting or straining for 6 weeks.  3. No driving until off narcotics and you feel that you can brake safely. For most patients, this is 5-10 days.  Do Not drive if you are taking narcotic pain medicine.  4. Shower daily.  Use soap and water on your incision and pat dry; don't rub.   5. No sexual activity and nothing in the vagina for 8 weeks.  Medications:  - Take ibuprofen and tylenol first line for pain control. Take these regularly (every 6 hours) to decrease the build up of pain.  - If necessary, for severe pain not relieved by ibuprofen, take tramadol.  - While taking tramadol you should take sennakot every night to reduce the likelihood of constipation. If this causes diarrhea, stop its use.  Diet: 1. Low sodium Heart Healthy Diet is recommended.  2. It is safe to use a laxative if you have difficulty moving your bowels.   Wound Care: 1. Keep clean and dry.  Shower daily.  Reasons to call the Doctor:  Fever - Oral temperature greater than 100.4 degrees Fahrenheit Foul-smelling vaginal discharge Difficulty urinating Nausea and vomiting Increased pain at the site of the incision that is unrelieved with pain medicine. Difficulty breathing with or without chest pain New calf pain especially if only on one side Sudden, continuing increased vaginal bleeding with or without clots.   Follow-up: 1. See Jeral Pinch in 3 weeks, on 12/2.  Contacts: For questions or concerns you should contact:  Dr. Jeral Pinch at [phone] After hours and on week-ends call [phone] and ask to speak to the physician on call for Gynecologic Oncology

## 2021-02-24 NOTE — Transfer of Care (Signed)
Immediate Anesthesia Transfer of Care Note  Patient: Laryssa Hassing  Procedure(s) Performed: Procedure(s): XI ROBOTIC ASSISTED TOTAL HYSTERECTOMY BILATERAL SALPINGO OOPHORECTOMY WITH OMENTECTOMY/MINI LAPAROTOMY (Bilateral)  Patient Location: PACU  Anesthesia Type:General  Level of Consciousness:  sedated, patient cooperative and responds to stimulation  Airway & Oxygen Therapy:Patient Spontanous Breathing and Patient connected to face mask oxgen  Post-op Assessment:  Report given to PACU RN and Post -op Vital signs reviewed and stable  Post vital signs:  Reviewed and stable  Last Vitals:  Vitals:   02/24/21 0602  BP: 113/65  Pulse: 68  Resp: 16  Temp: 36.7 C  SpO2: 446%    Complications: No apparent anesthesia complications

## 2021-02-24 NOTE — H&P (Signed)
Gynecologic Oncology H&P  02/24/21  Treatment History: Oncology History  Primary peritoneal carcinomatosis ([place])  08/21/2020 Initial Diagnosis   The patient reports intermittent right upper quadrant pain that feel like she pulled a muscle beginning in March 2022.  In April, she woke up with sharp pain in her right upper quadrant.  This was her first episode of sharp pain.  She thought that her pain was related to her gallbladder and presented to the emergency department.  She was seen in ED on 4/30. RUQ ultrasound and x-ray were normal as were LFTs and lipase.    10/27/2020 Imaging   Findings highly suspicious for peritoneal carcinomatosis. No site of primary malignancy identified.   Colonic diverticulosis, without radiographic evidence of diverticulitis   11/02/2020 Initial Diagnosis   Primary peritoneal carcinomatosis ([place])   11/02/2020 Tumor Marker   Patient's tumor was tested for the following markers: CA-125. Results of the tumor marker test revealed 88.   11/11/2020 Pathology Results   FINAL MICROSCOPIC DIAGNOSIS:   A. CUL DE SAC, LEFT ANTERIOR, EXCISION:  -  Poorly differentiated carcinoma  -  See comment   B. SIDEWALL, RIGHT, BIOPSY:  -  Poorly differentiated carcinoma  -  See comment   COMMENT:   By immunohistochemistry, the neoplastic cells are positive for cytokeratin 7, p53, PAX8 and WT1 but negative for cytokeratin 20, GATA3, CDX2 and TTF-1.  Overall, the immunophenotype is consistent with a  gynecologic primary.    11/11/2020 Pathology Results   FINAL MICROSCOPIC DIAGNOSIS:  - Malignant cells consistent with metastatic adenocarcinoma    11/11/2020 Surgery   Pre-operative Diagnosis: Carcinomatosis, mildly elevated CA-125   Post-operative Diagnosis: same, At least stage IIIC gyn malignancy   Operation: Diagnostic laparoscopy, peritoneal biopsies    Surgeon: [name] Operative Findings: On EUA, small mobile uterus. On intra-abdominal entry,  carcinomatosis appreciated studding the right anterior diaphragm, bilateral liver surfaces, mesentery, pelvic peritoneum. Omental cake with measuring up to 2x3cm. Right ovary adherent to the right uterus with peritoneal studding adjacent. Frondular implants noted along right pelvic sidewall. Miliary disease along anterior cul de sac. Cul de sac covered with friable miliary disease. Small volume dark amber ascites. Specimens: left anterior cul de sac peritoneum, right pelvic sidewall peritoneal biopsy          11/15/2020 Cancer Staging   Staging form: Ovary, Fallopian Tube, and Primary Peritoneal Carcinoma, AJCC 8th Edition - Clinical stage from 11/15/2020: FIGO Stage III (cT3, cN0, cM0) - Signed by [name], MD on 11/15/2020 Stage prefix: Initial diagnosis    11/26/2020 -  Chemotherapy   Patient is on Treatment Plan : OVARIAN Carboplatin (AUC 6) / Paclitaxel (175) q21d x 6 cycles     11/26/2020 Tumor Marker   Patient's tumor was tested for the following markers: CA-125. Results of the tumor marker test revealed 76.8.   12/17/2020 Tumor Marker   Patient's tumor was tested for the following markers: CA-125. Results of the tumor marker test revealed 58.1.   01/07/2021 Tumor Marker   Patient's tumor was tested for the following markers: CA-125. Results of the tumor marker test revealed 21.6.   01/20/2021 Imaging   Decreased omental soft tissue nodularity and caking, consistent with interval improvement in carcinoma.   No evidence of new or progressive disease within the abdomen or pelvis.   Colonic diverticulosis. No radiographic evidence of diverticulitis.   Large stool burden noted; recommend clinical correlation for possible constipation     Interval History: Patient is now s/p 4  cycles of NACT. Doing well. Presents today for IDS.  Past Medical/Surgical History: Past Medical History:  Diagnosis Date   Anemia    Cancer ([place])    Hole, retinal    Right eye   Osteoporosis    Polyp  of colon, unspecified part of colon, unspecified type    Benign    Past Surgical History:  Procedure Laterality Date   APPENDECTOMY  02/27/2007   [facility]   BILATERAL SALPINGECTOMY Right 03/1978   right ectopic, tied other tube   CATARACT EXTRACTION Right 10/31/2011   Dr. Katy Fitch   COLONOSCOPY  03/2019   Dr. Alessandra Bevels   COLONOSCOPY WITH PROPOFOL N/A 04/01/2018   Procedure: COLONOSCOPY WITH PROPOFOL;  Surgeon: Otis Brace, MD;  Location: [facility];  Service: Gastroenterology;  Laterality: N/A;   DILATION AND CURETTAGE OF UTERUS     x3, 2 for miscarriages, one for polyp vs fibroid   HAND / FINGER LESION EXCISION Left    lt. index finger   LAPAROSCOPY N/A 11/11/2020   Procedure: LAPAROSCOPY DIAGNOSTIC WITH PERITONEAL BIOPSY;  Surgeon: [name], MD;  Location: [facility];  Service: Gynecology;  Laterality: N/A;   POLYPECTOMY  04/01/2018   Procedure: POLYPECTOMY;  Surgeon: Otis Brace, MD;  Location: [facility];  Service: Gastroenterology;;   RETINAL TEAR REPAIR CRYOTHERAPY Right 04/25/2011   Dr. Dominic Pea INJECTION  04/01/2018   Procedure: SUBMUCOSAL INJECTION;  Surgeon: Otis Brace, MD;  Location: [facility];  Service: Gastroenterology;;   TUBAL LIGATION Left 03/1978    Family History  Problem Relation Age of Onset   Lung cancer Mother    Skin cancer Father    Breast cancer Daughter    Ovarian cancer Neg Hx    Endometrial cancer Neg Hx    Prostate cancer Neg Hx    Pancreatic cancer Neg Hx    Colon cancer Neg Hx     Social History   Socioeconomic History   Marital status: Legally Separated    Spouse name: Not on file   Number of children: Not on file   Years of education: Not on file   Highest education level: Not on file  Occupational History   Not on file  Tobacco Use   Smoking status: Never    Passive exposure: Past (18 years)   Smokeless tobacco: Never  Vaping Use   Vaping Use: Never used  Substance and Sexual Activity    Alcohol use: Not Currently   Drug use: Never   Sexual activity: Not Currently  Other Topics Concern   Not on file  Social History Narrative   Not on file   Social Determinants of Health   Financial Resource Strain: Not on file  Food Insecurity: Not on file  Transportation Needs: Not on file  Physical Activity: Not on file  Stress: Not on file  Social Connections: Not on file    Current Medications:  Current Facility-Administered Medications:    ceFAZolin (ANCEF) IVPB 2g/100 mL premix, 2 g, Intravenous, On Call to OR, Cross, Melissa D, NP   dexamethasone (DECADRON) injection 4 mg, 4 mg, Intravenous, On Call to OR, Joylene John D, NP   lactated ringers infusion, , Intravenous, Continuous, Nolon Nations, MD, Last Rate: 10 mL/hr at 02/24/21 6294, Continued from Pre-op at 02/24/21 7654  Review of Systems: Denies appetite changes, fevers, chills, fatigue, unexplained weight changes. Denies hearing loss, neck lumps or masses, mouth sores, ringing in ears or voice changes. Denies cough or wheezing.  Denies shortness of  breath. Denies chest pain or palpitations. Denies leg swelling. Denies abdominal distention, pain, blood in stools, constipation, diarrhea, nausea, vomiting, or early satiety. Denies pain with intercourse, dysuria, frequency, hematuria or incontinence. Denies hot flashes, pelvic pain, vaginal bleeding or vaginal discharge.   Denies joint pain, back pain or muscle pain/cramps. Denies itching, rash, or wounds. Denies dizziness, headaches, numbness or seizures. Denies swollen lymph nodes or glands, denies easy bruising or bleeding. Denies anxiety, depression, confusion, or decreased concentration.  Physical Exam: BP 113/65   Pulse 68   Temp 98 F (36.7 C) (Oral)   Resp 16   Wt 115 lb 3.2 oz (52.3 kg)   SpO2 100%   BMI 19.47 kg/m  General: Alert, oriented, no acute distress. HEENT: Normocephalic, atraumatic, sclera anicteric. Chest: Clear to auscultation  bilaterally.  No wheezes or rhonchi. Cardiovascular: Regular rate and rhythm, no murmurs. Abdomen: soft, nontender.  Normoactive bowel sounds.  No masses or hepatosplenomegaly appreciated.  Well-healed incisions.  Laboratory & Radiologic Studies: CBC    Component Value Date/Time   WBC 6.3 01/28/2021 0808   WBC 8.7 11/08/2020 1415   RBC 4.53 01/28/2021 0808   HGB 13.5 01/28/2021 0808   HCT 39.5 01/28/2021 0808   PLT 184 01/28/2021 0808   MCV 87.2 01/28/2021 0808   MCH 29.8 01/28/2021 0808   MCHC 34.2 01/28/2021 0808   RDW 13.2 01/28/2021 0808   LYMPHSABS 0.9 01/28/2021 0808   MONOABS 0.2 01/28/2021 0808   EOSABS 0.0 01/28/2021 0808   BASOSABS 0.0 01/28/2021 0808   BMP Latest Ref Rng & Units 01/28/2021 01/07/2021 12/17/2020  Glucose 70 - 99 mg/dL 160(H) 182(H) 162(H)  BUN 8 - 23 mg/dL $Remove'20 20 17  'iFFzPOK$ Creatinine 0.44 - 1.00 mg/dL 0.88 0.83 0.91  Sodium 135 - 145 mmol/L 139 137 137  Potassium 3.5 - 5.1 mmol/L 4.1 4.0 4.0  Chloride 98 - 111 mmol/L 104 104 103  CO2 22 - 32 mmol/L $RemoveB'24 23 24  '[id number]$ Calcium 8.9 - 10.3 mg/dL 9.7 9.5 9.5   Assessment & Plan: Robin Sellers is a 70 y.o. woman with advanced stage ovarian cancer s/p 4 cycles of NACT now presenting for interval debulking surgery.  Plan for diagnostic laparoscopy - if minimal/no visible disease, will proceed with robotic TLH/BSO and mini-lap for omentectomy and palpation of intra-abdominal and pelvic surfaces.  Jeral Pinch, MD  Division of Gynecologic Oncology  Department of Obstetrics and Gynecology  [facility]

## 2021-02-25 ENCOUNTER — Telehealth: Payer: Self-pay

## 2021-02-25 LAB — CA 125: Cancer Antigen (CA) 125: 10.5 U/mL (ref 0.0–38.1)

## 2021-02-25 NOTE — Telephone Encounter (Signed)
Spoke with Ms. Lichtenberger this morning. She states she is eating, drinking and urinating well. She has not had a BM yet but is passing gas. She is taking senokot as prescribed and encouraged her to drink plenty of water. She denies fever or chills. Incisions are dry and intact. Told her that she can remove the honeycomb dressing in 5 days which will be 03-01-21. She does not need to reapply any dressing.  Her pain is controlled with tylenol and ibuprofen. She will use Ibuprofen 800 mg q 12 prn with beginning Lovenox today per melissa Cross,NP. Pt verbalized understanding   Instructed to call office with any fever, chills, purulent drainage, uncontrolled pain or any other questions or concerns. Patient verbalizes understanding.  Pt aware of post op appointments as well as the office number [phone] and after hours number [phone] to call if she has any questions or concerns

## 2021-02-28 LAB — SURGICAL PATHOLOGY

## 2021-03-02 ENCOUNTER — Encounter: Payer: Self-pay | Admitting: Gynecologic Oncology

## 2021-03-02 ENCOUNTER — Ambulatory Visit (HOSPITAL_BASED_OUTPATIENT_CLINIC_OR_DEPARTMENT_OTHER): Payer: Medicare Other | Admitting: Gynecologic Oncology

## 2021-03-02 DIAGNOSIS — Z90722 Acquired absence of ovaries, bilateral: Secondary | ICD-10-CM

## 2021-03-02 DIAGNOSIS — C482 Malignant neoplasm of peritoneum, unspecified: Secondary | ICD-10-CM

## 2021-03-02 DIAGNOSIS — Z90710 Acquired absence of both cervix and uterus: Secondary | ICD-10-CM

## 2021-03-02 NOTE — Progress Notes (Signed)
Gynecologic Oncology Telehealth Consult Note: Gyn-Onc  I connected with Robin Sellers on 03/02/21 at  4:00 PM EST by telephone and verified that I am speaking with the correct person using two identifiers.  I discussed the limitations, risks, security and privacy concerns of performing an evaluation and management service by telemedicine and the availability of in-person appointments. I also discussed with the patient that there may be a patient responsible charge related to this service. The patient expressed understanding and agreed to proceed.  Other persons participating in the visit and their role in the encounter: None.  Patient's location: Home Provider's location: [facility]  Reason for Visit: Follow-up after surgery, treatment discussion  Treatment History: Oncology History  Primary peritoneal carcinomatosis ([place])  08/21/2020 Initial Diagnosis   The patient reports intermittent right upper quadrant pain that feel like she pulled a muscle beginning in March 2022.  In April, she woke up with sharp pain in her right upper quadrant.  This was her first episode of sharp pain.  She thought that her pain was related to her gallbladder and presented to the emergency department.  She was seen in ED on 4/30. RUQ ultrasound and x-ray were normal as were LFTs and lipase.    10/27/2020 Imaging   Findings highly suspicious for peritoneal carcinomatosis. No site of primary malignancy identified.   Colonic diverticulosis, without radiographic evidence of diverticulitis   11/02/2020 Initial Diagnosis   Primary peritoneal carcinomatosis ([place])   11/02/2020 Tumor Marker   Patient's tumor was tested for the following markers: CA-125. Results of the tumor marker test revealed 88.   11/11/2020 Pathology Results   FINAL MICROSCOPIC DIAGNOSIS:   A. CUL DE SAC, LEFT ANTERIOR, EXCISION:  -  Poorly differentiated carcinoma  -  See comment   B. SIDEWALL, RIGHT, BIOPSY:  -   Poorly differentiated carcinoma  -  See comment   COMMENT:   By immunohistochemistry, the neoplastic cells are positive for cytokeratin 7, p53, PAX8 and WT1 but negative for cytokeratin 20, GATA3, CDX2 and TTF-1.  Overall, the immunophenotype is consistent with a  gynecologic primary.    11/11/2020 Pathology Results   FINAL MICROSCOPIC DIAGNOSIS:  - Malignant cells consistent with metastatic adenocarcinoma    11/11/2020 Surgery   Pre-operative Diagnosis: Carcinomatosis, mildly elevated CA-125   Post-operative Diagnosis: same, At least stage IIIC gyn malignancy   Operation: Diagnostic laparoscopy, peritoneal biopsies    Surgeon: [name] Operative Findings: On EUA, small mobile uterus. On intra-abdominal entry, carcinomatosis appreciated studding the right anterior diaphragm, bilateral liver surfaces, mesentery, pelvic peritoneum. Omental cake with measuring up to 2x3cm. Right ovary adherent to the right uterus with peritoneal studding adjacent. Frondular implants noted along right pelvic sidewall. Miliary disease along anterior cul de sac. Cul de sac covered with friable miliary disease. Small volume dark amber ascites. Specimens: left anterior cul de sac peritoneum, right pelvic sidewall peritoneal biopsy          11/15/2020 Cancer Staging   Staging form: Ovary, Fallopian Tube, and Primary Peritoneal Carcinoma, AJCC 8th Edition - Clinical stage from 11/15/2020: FIGO Stage III (cT3, cN0, cM0) - Signed by [name], MD on 11/15/2020 Stage prefix: Initial diagnosis    11/26/2020 -  Chemotherapy   Patient is on Treatment Plan : OVARIAN Carboplatin (AUC 6) / Paclitaxel (175) q21d x 6 cycles     11/26/2020 Tumor Marker   Patient's tumor was tested for the following markers: CA-125. Results of the tumor marker test revealed  76.8.   12/17/2020 Tumor Marker   Patient's tumor was tested for the following markers: CA-125. Results of the tumor marker test revealed 58.1.   01/07/2021  Tumor Marker   Patient's tumor was tested for the following markers: CA-125. Results of the tumor marker test revealed 21.6.   01/20/2021 Imaging   Decreased omental soft tissue nodularity and caking, consistent with interval improvement in carcinoma.   No evidence of new or progressive disease within the abdomen or pelvis.   Colonic diverticulosis. No radiographic evidence of diverticulitis.   Large stool burden noted; recommend clinical correlation for possible constipation     Interval History: Patient reports doing well since surgery.  She has not had to use anything besides Tylenol, which she is taking every 6 hours.  She still has some tenderness around her larger incision.  She was able to walk half a mile yesterday.  Today, she went out to eat with a friend.  Her appetite has finally recovered.  She denies any nausea or emesis.  She endorses regular bowel function a day without the use of medications.  She has very minimal spotting when she wipes, denies any blood on her pad or discharge.  She denies any pelvic pain.  Past Medical/Surgical History: Past Medical History:  Diagnosis Date   Anemia    Cancer ([place])    Hole, retinal    Right eye   Osteoporosis    Polyp of colon, unspecified part of colon, unspecified type    Benign    Past Surgical History:  Procedure Laterality Date   APPENDECTOMY  02/27/2007   lsc   BILATERAL SALPINGECTOMY Right 03/1978   right ectopic, tied other tube   CATARACT EXTRACTION Right 10/31/2011   Dr. Katy Fitch   COLONOSCOPY  03/2019   Dr. Alessandra Bevels   COLONOSCOPY WITH PROPOFOL N/A 04/01/2018   Procedure: COLONOSCOPY WITH PROPOFOL;  Surgeon: Otis Brace, MD;  Location: [facility];  Service: Gastroenterology;  Laterality: N/A;   DILATION AND CURETTAGE OF UTERUS     x3, 2 for miscarriages, one for polyp vs fibroid   HAND / FINGER LESION EXCISION Left    lt. index finger   LAPAROSCOPY N/A 11/11/2020   Procedure: LAPAROSCOPY DIAGNOSTIC  WITH PERITONEAL BIOPSY;  Surgeon: [name], MD;  Location: [facility];  Service: Gynecology;  Laterality: N/A;   POLYPECTOMY  04/01/2018   Procedure: POLYPECTOMY;  Surgeon: Otis Brace, MD;  Location: [facility];  Service: Gastroenterology;;   RETINAL TEAR REPAIR CRYOTHERAPY Right 04/25/2011   Dr. Dominic Pea INJECTION  04/01/2018   Procedure: SUBMUCOSAL INJECTION;  Surgeon: Otis Brace, MD;  Location: [facility];  Service: Gastroenterology;;   TUBAL LIGATION Left 03/1978    Family History  Problem Relation Age of Onset   Lung cancer Mother    Skin cancer Father    Breast cancer Daughter    Ovarian cancer Neg Hx    Endometrial cancer Neg Hx    Prostate cancer Neg Hx    Pancreatic cancer Neg Hx    Colon cancer Neg Hx     Social History   Socioeconomic History   Marital status: Legally Separated    Spouse name: Not on file   Number of children: Not on file   Years of education: Not on file   Highest education level: Not on file  Occupational History   Not on file  Tobacco Use   Smoking status: Never    Passive exposure: Past (18 years)  Smokeless tobacco: Never  Vaping Use   Vaping Use: Never used  Substance and Sexual Activity   Alcohol use: Not Currently   Drug use: Never   Sexual activity: Not Currently  Other Topics Concern   Not on file  Social History Narrative   Not on file   Social Determinants of Health   Financial Resource Strain: Not on file  Food Insecurity: Not on file  Transportation Needs: Not on file  Physical Activity: Not on file  Stress: Not on file  Social Connections: Not on file    Current Medications:  Current Outpatient Medications:    Biotin 5000 MCG TABS, Take 5,000 mcg by mouth daily., Disp: , Rfl:    Calcium Carbonate-Vitamin D 600-400 MG-UNIT tablet, Take 1 tablet by mouth daily., Disp: , Rfl:    Cholecalciferol 50 MCG (2000 UT) CAPS, Take 2,000 Units by mouth daily., Disp: , Rfl:     dexamethasone (DECADRON) 4 MG tablet, Take 2 tabs at the night before and 2 tab the morning of chemotherapy, every 3 weeks, by mouth x 6 cycles, Disp: 36 tablet, Rfl: 6   enoxaparin (LOVENOX) 40 MG/0.4ML injection, Inject 0.4 mLs (40 mg total) into the skin daily for 14 doses. For AFTER surgery only, begin the day after surgery, Disp: 5.6 mL, Rfl: 0   erythromycin base (E-MYCIN) 500 MG tablet, Take two tablets at 2 pm, 3 pm, and 10 pm the day before surgery, Disp: 6 tablet, Rfl: 0   Multiple Vitamins-Minerals (MULTIVITAMIN WITH MINERALS) tablet, Take 1 tablet by mouth daily., Disp: , Rfl:    neomycin (MYCIFRADIN) 500 MG tablet, Take two tablets at 2 pm, 3 pm, and 10 pm the day before surgery, Disp: 6 tablet, Rfl: 0   ondansetron (ZOFRAN) 8 MG tablet, Take 1 tablet (8 mg total) by mouth every 8 (eight) hours as needed for refractory nausea / vomiting., Disp: 30 tablet, Rfl: 1   Polyvinyl Alcohol-Povidone (CLEAR EYES NATURAL TEARS) 5-6 MG/ML SOLN, Place 1 drop into both eyes daily as needed (Dry eyes)., Disp: , Rfl:    prochlorperazine (COMPAZINE) 10 MG tablet, Take 1 tablet (10 mg total) by mouth every 6 (six) hours as needed (Nausea or vomiting)., Disp: 30 tablet, Rfl: 1   risedronate (ACTONEL) 150 MG tablet, Take 150 mg by mouth every 30 (thirty) days., Disp: , Rfl:    senna-docusate (SENOKOT-S) 8.6-50 MG tablet, Take 2 tablets by mouth at bedtime. For AFTER surgery, do not take if having diarrhea, Disp: 30 tablet, Rfl: 0   traMADol (ULTRAM) 50 MG tablet, Take 1 tablet (50 mg total) by mouth every 6 (six) hours as needed for severe pain. For AFTER surgery only, do not take and drive, Disp: 10 tablet, Rfl: 0   tretinoin (RETIN-A) 0.025 % cream, Apply 1 application topically at bedtime as needed (blemishes)., Disp: , Rfl:   Review of Symptoms: Pertinent positives as per HPI   Physical Exam: There were no vitals taken for this visit. deferred given limitations of phone visit.  Laboratory &  Radiologic Studies: A. PERTIONEAL SCAR, EXCISION:  - Microscopic focus of high-grade serous carcinoma   B. 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 OMENTUM, RESECTION:  - Microscopic foci of high-grade serous carcinoma   Assessment & Plan:  Robin Sellers is a 70 y.o. 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  Patient is doing very well from a postoperative standpoint.  She is meeting all postoperative milestones.  Discussed continued expectations as well as limitations.  Reviewed pathology report.  There was tumor still in both ovaries and left fallopian tube.  Additional microscopic foci seen in peritoneal excisions and omentum.  No gross tumor noted outside of ovaries and fallopian tube.  Reviewed with her that given excellent tumor response, I think that 2 additional cycles versus 3 would be appropriate.  I have placed a referral to genetics as the patient has not had germline testing.  We will also ask that somatic testing be sent if it has not already.  We reviewed that if somatic or germline testing reveals certain mutations, that the patient may be an excellent candidate for maintenance therapy with a PARP inhibitor.  Patient is coming for chemotherapy and a visit with me on the second.  I have asked her to let my clinic know if timing does not allow for visit as scheduled with me.  This visit can be postponed if needed.  I discussed the assessment and treatment plan with the patient. The patient was provided with an opportunity to ask questions and all were answered. The patient agreed with the plan and demonstrated an understanding of  the instructions.   The patient was advised to call back or see an in-person evaluation if the symptoms worsen or if the condition fails to improve as anticipated.   22 minutes of total time was spent for this patient encounter, including preparation, face-to-face counseling with the patient and coordination of care, and documentation of the encounter.   Jeral Pinch, MD  Division of Gynecologic Oncology  Department of Obstetrics and Gynecology  [facility]

## 2021-03-03 ENCOUNTER — Telehealth: Payer: Self-pay | Admitting: Oncology

## 2021-03-03 ENCOUNTER — Telehealth: Payer: Medicare Other | Admitting: Gynecologic Oncology

## 2021-03-03 NOTE — Telephone Encounter (Signed)
[name] called back and confirmed the genetics appointment.

## 2021-03-03 NOTE — Telephone Encounter (Signed)
Left a message with genetic counseling appointment.

## 2021-03-11 ENCOUNTER — Ambulatory Visit: Payer: Medicare Other | Admitting: Hematology and Oncology

## 2021-03-11 ENCOUNTER — Ambulatory Visit: Payer: Medicare Other

## 2021-03-11 ENCOUNTER — Other Ambulatory Visit: Payer: Medicare Other

## 2021-03-14 ENCOUNTER — Telehealth: Payer: Self-pay

## 2021-03-14 ENCOUNTER — Other Ambulatory Visit: Payer: Self-pay

## 2021-03-14 ENCOUNTER — Inpatient Hospital Stay: Payer: Medicare Other

## 2021-03-14 ENCOUNTER — Inpatient Hospital Stay: Payer: Medicare Other | Attending: Gynecologic Oncology | Admitting: Genetic Counselor

## 2021-03-14 ENCOUNTER — Encounter: Payer: Self-pay | Admitting: Genetic Counselor

## 2021-03-14 ENCOUNTER — Other Ambulatory Visit: Payer: Self-pay | Admitting: Genetic Counselor

## 2021-03-14 ENCOUNTER — Telehealth: Payer: Self-pay | Admitting: Oncology

## 2021-03-14 DIAGNOSIS — C482 Malignant neoplasm of peritoneum, unspecified: Secondary | ICD-10-CM

## 2021-03-14 DIAGNOSIS — R748 Abnormal levels of other serum enzymes: Secondary | ICD-10-CM | POA: Insufficient documentation

## 2021-03-14 DIAGNOSIS — Z803 Family history of malignant neoplasm of breast: Secondary | ICD-10-CM

## 2021-03-14 HISTORY — DX: Family history of malignant neoplasm of breast: Z80.3

## 2021-03-14 LAB — CBC WITH DIFFERENTIAL (CANCER CENTER ONLY)
Abs Immature Granulocytes: 0.02 10*3/uL (ref 0.00–0.07)
Basophils Absolute: 0.1 10*3/uL (ref 0.0–0.1)
Basophils Relative: 1 %
Eosinophils Absolute: 0.7 10*3/uL — ABNORMAL HIGH (ref 0.0–0.5)
Eosinophils Relative: 13 %
HCT: 34.1 % — ABNORMAL LOW (ref 36.0–46.0)
Hemoglobin: 11.1 g/dL — ABNORMAL LOW (ref 12.0–15.0)
Immature Granulocytes: 0 %
Lymphocytes Relative: 18 %
Lymphs Abs: 0.9 10*3/uL (ref 0.7–4.0)
MCH: 30 pg (ref 26.0–34.0)
MCHC: 32.6 g/dL (ref 30.0–36.0)
MCV: 92.2 fL (ref 80.0–100.0)
Monocytes Absolute: 0.3 10*3/uL (ref 0.1–1.0)
Monocytes Relative: 6 %
Neutro Abs: 3 10*3/uL (ref 1.7–7.7)
Neutrophils Relative %: 62 %
Platelet Count: 218 10*3/uL (ref 150–400)
RBC: 3.7 MIL/uL — ABNORMAL LOW (ref 3.87–5.11)
RDW: 13.7 % (ref 11.5–15.5)
WBC Count: 4.9 10*3/uL (ref 4.0–10.5)
nRBC: 0 % (ref 0.0–0.2)

## 2021-03-14 LAB — CMP (CANCER CENTER ONLY)
ALT: 1012 U/L (ref 0–44)
AST: 858 U/L (ref 15–41)
Albumin: 3.9 g/dL (ref 3.5–5.0)
Alkaline Phosphatase: 214 U/L — ABNORMAL HIGH (ref 38–126)
Anion gap: 9 (ref 5–15)
BUN: 21 mg/dL (ref 8–23)
CO2: 26 mmol/L (ref 22–32)
Calcium: 9.4 mg/dL (ref 8.9–10.3)
Chloride: 106 mmol/L (ref 98–111)
Creatinine: 0.75 mg/dL (ref 0.44–1.00)
GFR, Estimated: >60 mL/min (ref >60)
Glucose, Bld: 131 mg/dL — ABNORMAL HIGH (ref 70–99)
Potassium: 4 mmol/L (ref 3.5–5.1)
Sodium: 141 mmol/L (ref 135–145)
Total Bilirubin: 1 mg/dL (ref 0.3–1.2)
Total Protein: 6.8 g/dL (ref 6.5–8.1)

## 2021-03-14 LAB — GENETIC SCREENING ORDER

## 2021-03-14 NOTE — Telephone Encounter (Signed)
Called [name] and advised her of AST and ALT results from today.  Scheduled repeat labs on 03/18/21 at 9:00 and virtual visit with Dr. Alvy Bimler at 1:45.  Advised her to not take anyTylenol and avoid alcohol.  She said she did take a Tylenol today but will not take any more.

## 2021-03-14 NOTE — Telephone Encounter (Signed)
CRITICAL VALUE STICKER  CRITICAL VALUE: AST: [id number], ALT: 1,012  RECEIVER (on-site recipient of call): Patty Sermons, RN  [place] NOTIFIED: 03/14/2021 @ 1432  MESSENGER (representative from lab): Hardie Pulley  MD NOTIFIED: Dr. Alvy Bimler  TIME OF NOTIFICATION: 03/14/2021 @ 1435  RESPONSE: MD aware.No new orders received.

## 2021-03-14 NOTE — Progress Notes (Signed)
REFERRING PROVIDER: [name], MD [address] [place],  [place] [address]  PRIMARY PROVIDER:  Burton Apley, MD  PRIMARY REASON FOR VISIT:  Encounter Diagnoses  Name Primary?   Primary peritoneal carcinomatosis (HCC) Yes   Family history of breast cancer      HISTORY OF PRESENT ILLNESS:   Robin Sellers, a 70 y.o. female, was seen for a [place] cancer genetics consultation at the request of Dr. [name] due to a personal history of primary peritoneal cancer.  Robin Sellers presents to clinic today to discuss the possibility of a hereditary predisposition to cancer, to discuss genetic testing, and to further clarify her future cancer risks, as well as potential cancer risks for family members.   In 2022, at the age of 29, Robin Sellers was diagnosed with primary peritoneal carcinomatosis (high grade serous).  As of November 2022, she is status post interval debulking after 4 cycles of neoadjuvant.    CANCER HISTORY:  Oncology History  Primary peritoneal carcinomatosis (HCC)  08/21/2020 Initial Diagnosis   The patient reports intermittent right upper quadrant pain that feel like she pulled a muscle beginning in March 2022.  In April, she woke up with sharp pain in her right upper quadrant.  This was her first episode of sharp pain.  She thought that her pain was related to her gallbladder and presented to the emergency department.  She was seen in ED on 4/30. RUQ ultrasound and x-ray were normal as were LFTs and lipase.    10/27/2020 Imaging   Findings highly suspicious for peritoneal carcinomatosis. No site of primary malignancy identified.   Colonic diverticulosis, without radiographic evidence of diverticulitis   11/02/2020 Initial Diagnosis   Primary peritoneal carcinomatosis (HCC)   11/02/2020 Tumor Marker   Patient's tumor was tested for the following markers: CA-125. Results of the tumor marker test revealed 88.   11/11/2020 Pathology Results   FINAL MICROSCOPIC  DIAGNOSIS:   A. CUL DE SAC, LEFT ANTERIOR, EXCISION:  -  Poorly differentiated carcinoma  -  See comment   B. SIDEWALL, RIGHT, BIOPSY:  -  Poorly differentiated carcinoma  -  See comment   COMMENT:   By immunohistochemistry, the neoplastic cells are positive for cytokeratin 7, p53, PAX8 and WT1 but negative for cytokeratin 20, GATA3, CDX2 and TTF-1.  Overall, the immunophenotype is consistent with a  gynecologic primary.    11/11/2020 Pathology Results   FINAL MICROSCOPIC DIAGNOSIS:  - Malignant cells consistent with metastatic adenocarcinoma    11/11/2020 Surgery   Pre-operative Diagnosis: Carcinomatosis, mildly elevated CA-125   Post-operative Diagnosis: same, At least stage IIIC gyn malignancy   Operation: Diagnostic laparoscopy, peritoneal biopsies    Surgeon: Eugene Garnet MD Operative Findings: On EUA, small mobile uterus. On intra-abdominal entry, carcinomatosis appreciated studding the right anterior diaphragm, bilateral liver surfaces, mesentery, pelvic peritoneum. Omental cake with measuring up to 2x3cm. Right ovary adherent to the right uterus with peritoneal studding adjacent. Frondular implants noted along right pelvic sidewall. Miliary disease along anterior cul de sac. Cul de sac covered with friable miliary disease. Small volume dark amber ascites. Specimens: left anterior cul de sac peritoneum, right pelvic sidewall peritoneal biopsy          11/15/2020 Cancer Staging   Staging form: Ovary, Fallopian Tube, and Primary Peritoneal Carcinoma, AJCC 8th Edition - Clinical stage from 11/15/2020: FIGO Stage III (cT3, cN0, cM0) - Signed by Artis Delay, MD on 11/15/2020 Stage prefix: Initial diagnosis    11/26/2020 -  Chemotherapy   Patient is on Treatment Plan : OVARIAN Carboplatin (AUC 6) / Paclitaxel (175) q21d x 6 cycles     11/26/2020 Tumor Marker   Patient's tumor was tested for the following markers: CA-125. Results of the tumor marker test revealed 76.8.    12/17/2020 Tumor Marker   Patient's tumor was tested for the following markers: CA-125. Results of the tumor marker test revealed 58.1.   01/07/2021 Tumor Marker   Patient's tumor was tested for the following markers: CA-125. Results of the tumor marker test revealed 21.6.   01/20/2021 Imaging   Decreased omental soft tissue nodularity and caking, consistent with interval improvement in carcinoma.   No evidence of new or progressive disease within the abdomen or pelvis.   Colonic diverticulosis. No radiographic evidence of diverticulitis.   Large stool burden noted; recommend clinical correlation for possible constipation     RISK FACTORS:  Menarche was at age 89.  First live birth at age 30.  OCP use for approximately 2 years.  Ovaries intact: no; see history noted above Hysterectomy: yes; see history noted above Menopausal status: postmenopausal.  HRT use:  approximately 5  years;  Colonoscopy: yes;  most recent in 2019; three sessile serrated adenomas/one hyperplastic polyp . Mammogram within the last year: yes. Number of breast biopsies: 0. Any excessive radiation exposure in the past: no Dermatology: yes; annually   Past Medical History:  Diagnosis Date   Anemia    Cancer ([place])    Hole, retinal    Right eye   Osteoporosis    Polyp of colon, unspecified part of colon, unspecified type    Benign    Past Surgical History:  Procedure Laterality Date   APPENDECTOMY  02/27/2007   [facility]sc   BILATERAL SALPINGECTOMY Right 03/1978   right ectopic, tied other tube   CATARACT EXTRACTION Right 10/31/2011   Dr. Katy Fitch   COLONOSCOPY  03/2019   Dr. Alessandra Bevels   COLONOSCOPY WITH PROPOFOL N/A 04/01/2018   Procedure: COLONOSCOPY WITH PROPOFOL;  Surgeon: Otis Brace, MD;  Location: [facility];  Service: Gastroenterology;  Laterality: N/A;   DILATION AND CURETTAGE OF UTERUS     x3, 2 for miscarriages, one for polyp vs fibroid   HAND / FINGER LESION EXCISION Left    lt.  index finger   LAPAROSCOPY N/A 11/11/2020   Procedure: LAPAROSCOPY DIAGNOSTIC WITH PERITONEAL BIOPSY;  Surgeon: [name], MD;  Location: [facility];  Service: Gynecology;  Laterality: N/A;   POLYPECTOMY  04/01/2018   Procedure: POLYPECTOMY;  Surgeon: Otis Brace, MD;  Location: [facility];  Service: Gastroenterology;;   RETINAL TEAR REPAIR CRYOTHERAPY Right 04/25/2011   Dr. Dominic Pea INJECTION  04/01/2018   Procedure: SUBMUCOSAL INJECTION;  Surgeon: Otis Brace, MD;  Location: [facility];  Service: Gastroenterology;;   TUBAL LIGATION Left 03/1978    Social History   Socioeconomic History   Marital status: Legally Separated    Spouse name: Not on file   Number of children: Not on file   Years of education: Not on file   Highest education level: Not on file  Occupational History   Not on file  Tobacco Use   Smoking status: Never    Passive exposure: Past (18 years)   Smokeless tobacco: Never  Vaping Use   Vaping Use: Never used  Substance and Sexual Activity   Alcohol use: Not Currently   Drug use: Never   Sexual activity: Not Currently  Other Topics Concern   Not  on file  Social History Narrative   Not on file   Social Determinants of Health   Financial Resource Strain: Not on file  Food Insecurity: Not on file  Transportation Needs: Not on file  Physical Activity: Not on file  Stress: Not on file  Social Connections: Not on file     FAMILY HISTORY:  We obtained a detailed, 4-generation family history.  Significant diagnoses are listed below: Family History  Problem Relation Age of Onset   Lung cancer Mother 41       smoking hx   Skin cancer Father        dx after 29; non-melanoma; scalp   Lung cancer Maternal Aunt        dx after 77; smoking hx   Breast cancer Daughter 97       ER+   Skin cancer Cousin        face; surgery only     Robin Sellers's daughter had reportedly had negative genetic testing in 2019. Robin Sellers  is unaware of previous family history of genetic testing for hereditary cancer risks besides that mentioned above. There is no reported Ashkenazi Jewish ancestry. There is no known consanguinity.  GENETIC COUNSELING ASSESSMENT: Robin Sellers is a 70 y.o. female with a personal history of cancer which is somewhat suggestive of a hereditary cancer syndrome and predisposition to cancer given her diagnosis of primary peritoneal cancer and the presence of related cancers in the family. We, therefore, discussed and recommended the following at today's visit.   DISCUSSION: We discussed that  approximately 15% of ovarian cancer is hereditary, with most cases of hereditary ovarian cancer associated with mutations in BRCA1/2.  There are other genes that can be associated with hereditary breast or ovarian cancer syndromes.  Type of cancer risk and level of risk are gene-specific.  We discussed that testing is beneficial for several reasons including knowing how to follow individuals for their cancer risks, identifying whether potential treatment options, such as PARP inhibitors, would be beneficial, and understanding if other family members could be at risk for cancer and allowing them to undergo genetic testing.   We reviewed the characteristics, features and inheritance patterns of hereditary cancer syndromes. We also discussed genetic testing, including the appropriate family members to test, the process of testing, insurance coverage and turn-around-time for results. We discussed the implications of a negative, positive, carrier and/or variant of uncertain significant result. We discussed that germline genetic testing will test for hereditary mutations that could explain her diagnosis of cancer.  However, homologous recombination testing (HRD) is genetic testing performed on the tumor that can determine genetic changes that could influence her management such as eligibility for targeted therapies.  We recommended Ms.  Sellers pursue genetic testing for a panel that includes genes associated with ovarian and breast cancer genes in addition to tumor testing (HRD status).   The [facility] gene panel offered by [employer] includes sequencing and deletion/duplication testing of the following 48 genes: APC, ATM, AXIN2, BAP1, BARD1, BMPR1A, BRCA1, BRCA2, BRIP1, CHD1, CDK4, CDKN2A(p16 and p14ARF), CHEK2, CTNNA1, EGFR, EPCAM, FH, FLCN, GREM1, HOXB13, MEN1, MET, MITF , MLH1, MSH2, MSH3, MSH6, MUTYH, NTHL1, PALB2, PMS2, POLD1, POLE, PTEN, RAD51C, RAD51D, RET, SDHA, SDHB, SDHC, SDHD, SMAD4, STK11,TERT, TP53, TSC1, TSC2, and VHL.  Unless otherwise specified, all coding regions and flanking non-coding regions are analyzed for sequence variation. Analysis of flanking intronic regions typically do not extend more than 20 bp before and 10 bp after each  exon, though the exact region may be adjusted based on the presence of either potentially significant variants or highly repetitive sequences. Coding regions and proximal promoter regions near the transcription start sites are analyzed for large deletions or duplications. Specific genes are tested only for sequence and/or CNVs within limited regions. Limited clinically relevant regions are included for EGFR (sequencing and CNV analysis of exons 18-21),RET (sequencing and CNV analysis of exons 5, 8, 10, 11, and 13-16), and MITF (sequencing of position c.952). Only CNV analysis of the last two exons of EPCAM is performed. CNV analysis of GREM1 includes the upstream region overlapping the adjacent gene SCG5. MSH3 exon 1 contains a long polyalanine repeat that can interfere with variant calling; therefore, MSH3 analysis excludes c.121 to c.237. Only sequence analysis of the exons encompassing the exonuclease domains of these genes is performed (POLD1 c.841 to c.1686, POLE c.802 to c.1473). Limited promoter regions in selected genes undergo sequence analysis including TERT (c.-71 to  c.-1), and APC Promoter 1B (c.-195 to c.-190 and c.-125 ([id number]).    [facility] MyChoice is a next generation sequencing-based in vitro diagnostic test that assesses the qualitative detection and classification of single nucleotide variants, insertions and deletions, and large rearrangement variants in protein coding regions and intron/exon boundaries of the BRCA1 and BRCA2 genes and the determination of Genomic Instability Score (GIS) which is an algorithmic measurement of Loss of Heterozygosity (LOH), Telomeric Allelic Imbalance (TAI), and Large-scale State Transitions (LST).   Based on Robin Sellers's personal history of cancer, she meets medical criteria for genetic testing. Despite that she meets criteria, she may still have an out of pocket cost.   PLAN: After considering the risks, benefits, and limitations, Robin Sellers provided informed consent to pursue genetic testing and the blood sample was sent to [facility] for analysis of the [facility].  [facility] will request sample of tumor for MyChoice testing. Results should be available within approximately 3-5 weeks' time, at which point they will be disclosed by telephone to Robin Sellers, as will any additional recommendations warranted by these results. Robin Sellers will receive a summary of her genetic counseling visit and a copy of her results once available. This information will also be available in Epic.   Lastly, we encouraged Robin Sellers to remain in contact with cancer genetics annually so that we can continuously update the family history and inform her of any changes in cancer genetics and testing that may be of benefit for this family.   Robin Sellers questions were answered to her satisfaction today. Our contact information was provided should additional questions or concerns arise. Thank you for the referral and allowing [place] to share in the care of your patient.   Maksymilian Mabey M. Joette Catching, [place], [facility] Genetic  Counselor [email or web address] ([phone]  The patient was seen for a total of 30 minutes in face-to-face genetic counseling.  The patient was seen alone.  Drs. Magrinat, Lindi Adie and/or Burr Medico were available to discuss this case as needed.    _______________________________________________________________________ For Office Staff:  Number of people involved in session: 1 Was an Intern/ student involved with case: no

## 2021-03-15 LAB — CA 125: Cancer Antigen (CA) 125: 32.7 U/mL (ref 0.0–38.1)

## 2021-03-18 ENCOUNTER — Other Ambulatory Visit: Payer: Self-pay

## 2021-03-18 ENCOUNTER — Inpatient Hospital Stay (HOSPITAL_BASED_OUTPATIENT_CLINIC_OR_DEPARTMENT_OTHER): Payer: Medicare Other | Admitting: Hematology and Oncology

## 2021-03-18 ENCOUNTER — Inpatient Hospital Stay: Payer: Medicare Other

## 2021-03-18 ENCOUNTER — Encounter: Payer: Self-pay | Admitting: Hematology and Oncology

## 2021-03-18 ENCOUNTER — Telehealth: Payer: Self-pay

## 2021-03-18 DIAGNOSIS — C482 Malignant neoplasm of peritoneum, unspecified: Secondary | ICD-10-CM

## 2021-03-18 DIAGNOSIS — R748 Abnormal levels of other serum enzymes: Secondary | ICD-10-CM | POA: Insufficient documentation

## 2021-03-18 LAB — CMP (CANCER CENTER ONLY)
ALT: 477 U/L (ref 0–44)
AST: 158 U/L — ABNORMAL HIGH (ref 15–41)
Albumin: 4 g/dL (ref 3.5–5.0)
Alkaline Phosphatase: 287 U/L — ABNORMAL HIGH (ref 38–126)
Anion gap: 10 (ref 5–15)
BUN: 13 mg/dL (ref 8–23)
CO2: 27 mmol/L (ref 22–32)
Calcium: 9.5 mg/dL (ref 8.9–10.3)
Chloride: 104 mmol/L (ref 98–111)
Creatinine: 0.85 mg/dL (ref 0.44–1.00)
GFR, Estimated: >60 mL/min (ref >60)
Glucose, Bld: 174 mg/dL — ABNORMAL HIGH (ref 70–99)
Potassium: 3.9 mmol/L (ref 3.5–5.1)
Sodium: 141 mmol/L (ref 135–145)
Total Bilirubin: 1.9 mg/dL — ABNORMAL HIGH (ref 0.3–1.2)
Total Protein: 7.2 g/dL (ref 6.5–8.1)

## 2021-03-18 LAB — CBC WITH DIFFERENTIAL (CANCER CENTER ONLY)
Abs Immature Granulocytes: 0.03 10*3/uL (ref 0.00–0.07)
Basophils Absolute: 0.1 10*3/uL (ref 0.0–0.1)
Basophils Relative: 1 %
Eosinophils Absolute: 0.9 10*3/uL — ABNORMAL HIGH (ref 0.0–0.5)
Eosinophils Relative: 13 %
HCT: 39.9 % (ref 36.0–46.0)
Hemoglobin: 12.8 g/dL (ref 12.0–15.0)
Immature Granulocytes: 0 %
Lymphocytes Relative: 15 %
Lymphs Abs: 1 10*3/uL (ref 0.7–4.0)
MCH: 30 pg (ref 26.0–34.0)
MCHC: 32.1 g/dL (ref 30.0–36.0)
MCV: 93.7 fL (ref 80.0–100.0)
Monocytes Absolute: 0.4 10*3/uL (ref 0.1–1.0)
Monocytes Relative: 5 %
Neutro Abs: 4.4 10*3/uL (ref 1.7–7.7)
Neutrophils Relative %: 66 %
Platelet Count: 201 10*3/uL (ref 150–400)
RBC: 4.26 MIL/uL (ref 3.87–5.11)
RDW: 13.8 % (ref 11.5–15.5)
WBC Count: 6.7 10*3/uL (ref 4.0–10.5)
nRBC: 0 % (ref 0.0–0.2)

## 2021-03-18 NOTE — Assessment & Plan Note (Signed)
She is scheduled to return next week to resume chemotherapy The cause of her abnormal LFTs is unknown but we need to rule out gallbladder pathology I will schedule ultrasound right upper quadrant as well as repeat lab next week before I see her back on Friday If her labs improved, we can resume chemotherapy

## 2021-03-18 NOTE — Progress Notes (Signed)
HEMATOLOGY-ONCOLOGY ELECTRONIC VISIT PROGRESS NOTE  Patient Care Team: Lorene Dy, MD as PCP - General (Internal Medicine)  I connected with the patient via telephone conference  ASSESSMENT & PLAN:  Primary peritoneal carcinomatosis [facility]) She is scheduled to return next week to resume chemotherapy The cause of her abnormal LFTs is unknown but we need to rule out gallbladder pathology I will schedule ultrasound right upper quadrant as well as repeat lab next week before I see her back on Friday If her labs improved, we can resume chemotherapy   Elevated liver enzymes She has profound elevated liver enzymes of unknown etiology I suspect it could be related to anesthesia or previous antibiotics Majority of the liver enzymes are trending down except her bilirubin is increased She has no new signs or symptoms to suggest acute cholecystitis I will order ultrasound right upper quadrant for evaluation She is instructed to avoid acetaminophen and to drink plenty of fluids We will repeat liver enzymes before I see her on Friday  Orders Placed This Encounter  Procedures   US Abdomen Limited RUQ (LIVER/GB)    Standing Status:   Future    Standing Expiration Date:   03/18/2022    Order Specific Question:   Reason for Exam (SYMPTOM  OR DIAGNOSIS REQUIRED)    Answer:   elevated liver enzymes    Order Specific Question:   Preferred imaging location?    Answer:   [place]    INTERVAL HISTORY: Please see below for problem oriented charting. The purpose of today's discussion is to review recent abnormal liver enzymes Her liver enzymes were drawn several days ago as part of genetic testing She denies right upper quadrant pain, fever or chills She denies taking anything new except for anticoagulation therapy for DVT prophylaxis after surgery and acetaminophen as needed after surgery  SUMMARY OF ONCOLOGIC HISTORY: Oncology History  Primary peritoneal carcinomatosis ([place])   08/21/2020 Initial Diagnosis   The patient reports intermittent right upper quadrant pain that feel like she pulled a muscle beginning in March 2022.  In April, she woke up with sharp pain in her right upper quadrant.  This was her first episode of sharp pain.  She thought that her pain was related to her gallbladder and presented to the emergency department.  She was seen in ED on 4/30. RUQ ultrasound and x-ray were normal as were LFTs and lipase.    10/27/2020 Imaging   Findings highly suspicious for peritoneal carcinomatosis. No site of primary malignancy identified.   Colonic diverticulosis, without radiographic evidence of diverticulitis   11/02/2020 Initial Diagnosis   Primary peritoneal carcinomatosis ([place])   11/02/2020 Tumor Marker   Patient's tumor was tested for the following markers: CA-125. Results of the tumor marker test revealed 88.   11/11/2020 Pathology Results   FINAL MICROSCOPIC DIAGNOSIS:   A. CUL DE SAC, LEFT ANTERIOR, EXCISION:  -  Poorly differentiated carcinoma  -  See comment   B. SIDEWALL, RIGHT, BIOPSY:  -  Poorly differentiated carcinoma  -  See comment   COMMENT:   By immunohistochemistry, the neoplastic cells are positive for cytokeratin 7, p53, PAX8 and WT1 but negative for cytokeratin 20, GATA3, CDX2 and TTF-1.  Overall, the immunophenotype is consistent with a  gynecologic primary.    11/11/2020 Pathology Results   FINAL MICROSCOPIC DIAGNOSIS:  - Malignant cells consistent with metastatic adenocarcinoma    11/11/2020 Surgery   Pre-operative Diagnosis: Carcinomatosis, mildly elevated CA-125   Post-operative Diagnosis: same, At least  stage IIIC gyn malignancy   Operation: Diagnostic laparoscopy, peritoneal biopsies    Surgeon: [name] Operative Findings: On EUA, small mobile uterus. On intra-abdominal entry, carcinomatosis appreciated studding the right anterior diaphragm, bilateral liver surfaces, mesentery, pelvic peritoneum. Omental  cake with measuring up to 2x3cm. Right ovary adherent to the right uterus with peritoneal studding adjacent. Frondular implants noted along right pelvic sidewall. Miliary disease along anterior cul de sac. Cul de sac covered with friable miliary disease. Small volume dark amber ascites. Specimens: left anterior cul de sac peritoneum, right pelvic sidewall peritoneal biopsy          11/15/2020 Cancer Staging   Staging form: Ovary, Fallopian Tube, and Primary Peritoneal Carcinoma, AJCC 8th Edition - Clinical stage from 11/15/2020: FIGO Stage III (cT3, cN0, cM0) - Signed by [name], MD on 11/15/2020 Stage prefix: Initial diagnosis    11/26/2020 -  Chemotherapy   Patient is on Treatment Plan : OVARIAN Carboplatin (AUC 6) / Paclitaxel (175) q21d x 6 cycles     11/26/2020 Tumor Marker   Patient's tumor was tested for the following markers: CA-125. Results of the tumor marker test revealed 76.8.   12/17/2020 Tumor Marker   Patient's tumor was tested for the following markers: CA-125. Results of the tumor marker test revealed 58.1.   01/07/2021 Tumor Marker   Patient's tumor was tested for the following markers: CA-125. Results of the tumor marker test revealed 21.6.   01/20/2021 Imaging   Decreased omental soft tissue nodularity and caking, consistent with interval improvement in carcinoma.   No evidence of new or progressive disease within the abdomen or pelvis.   Colonic diverticulosis. No radiographic evidence of diverticulitis.   Large stool burden noted; recommend clinical correlation for possible constipation   03/14/2021 Tumor Marker   Patient's tumor was tested for the following markers: CA-125. Results of the tumor marker test revealed 32.7.     REVIEW OF SYSTEMS:   Constitutional: Denies fevers, chills or abnormal weight loss Eyes: Denies blurriness of vision Ears, nose, mouth, throat, and face: Denies mucositis or sore throat Respiratory: Denies cough, dyspnea or  wheezes Cardiovascular: Denies palpitation, chest discomfort Gastrointestinal:  Denies nausea, heartburn or change in bowel habits Skin: Denies abnormal skin rashes Lymphatics: Denies new lymphadenopathy or easy bruising Neurological:Denies numbness, tingling or new weaknesses Behavioral/Psych: Mood is stable, no new changes  Extremities: No lower extremity edema All other systems were reviewed with the patient and are negative.  I have reviewed the past medical history, past surgical history, social history and family history with the patient and they are unchanged from previous note.  ALLERGIES:  is allergic to artichoke [cynara scolymus (artichoke)].  MEDICATIONS:  Current Outpatient Medications  Medication Sig Dispense Refill   Biotin 5000 MCG TABS Take 5,000 mcg by mouth daily.     Calcium Carbonate-Vitamin D 600-400 MG-UNIT tablet Take 1 tablet by mouth daily.     Cholecalciferol 50 MCG (2000 UT) CAPS Take 2,000 Units by mouth daily.     dexamethasone (DECADRON) 4 MG tablet Take 2 tabs at the night before and 2 tab the morning of chemotherapy, every 3 weeks, by mouth x 6 cycles 36 tablet 6   Multiple Vitamins-Minerals (MULTIVITAMIN WITH MINERALS) tablet Take 1 tablet by mouth daily.     ondansetron (ZOFRAN) 8 MG tablet Take 1 tablet (8 mg total) by mouth every 8 (eight) hours as needed for refractory nausea / vomiting. 30 tablet 1   Polyvinyl Alcohol-Povidone (CLEAR EYES NATURAL  TEARS) 5-6 MG/ML SOLN Place 1 drop into both eyes daily as needed (Dry eyes).     prochlorperazine (COMPAZINE) 10 MG tablet Take 1 tablet (10 mg total) by mouth every 6 (six) hours as needed (Nausea or vomiting). 30 tablet 1   risedronate (ACTONEL) 150 MG tablet Take 150 mg by mouth every 30 (thirty) days.     senna-docusate (SENOKOT-S) 8.6-50 MG tablet Take 2 tablets by mouth at bedtime. For AFTER surgery, do not take if having diarrhea 30 tablet 0   traMADol (ULTRAM) 50 MG tablet Take 1 tablet (50 mg total)  by mouth every 6 (six) hours as needed for severe pain. For AFTER surgery only, do not take and drive 10 tablet 0   tretinoin (RETIN-A) 0.025 % cream Apply 1 application topically at bedtime as needed (blemishes).     No current facility-administered medications for this visit.    PHYSICAL EXAMINATION: ECOG PERFORMANCE STATUS: 1 - Symptomatic but completely ambulatory  LABORATORY DATA:  I have reviewed the data as listed CMP Latest Ref Rng & Units 03/18/2021 03/14/2021 02/24/2021  Glucose 70 - 99 mg/dL 174(H) 131(H) 91  BUN 8 - 23 mg/dL $Remove'13 21 14  'hkOVxuU$ Creatinine 0.44 - 1.00 mg/dL 0.85 0.75 0.57  Sodium 135 - 145 mmol/L 141 141 139  Potassium 3.5 - 5.1 mmol/L 3.9 4.0 4.1  Chloride 98 - 111 mmol/L 104 106 105  CO2 22 - 32 mmol/L $RemoveB'27 26 27  'LGbkbaTu$ Calcium 8.9 - 10.3 mg/dL 9.5 9.4 8.7(L)  Total Protein 6.5 - 8.1 g/dL 7.2 6.8 6.5  Total Bilirubin 0.3 - 1.2 mg/dL 1.9(H) 1.0 0.5  Alkaline Phos 38 - 126 U/L 287(H) 214(H) 35(L)  AST 15 - 41 U/L 158(H) 858(HH) 24  ALT 0 - 44 U/L 477(HH) 1,012(HH) 18    Lab Results  Component Value Date   WBC 6.7 03/18/2021   HGB 12.8 03/18/2021   HCT 39.9 03/18/2021   MCV 93.7 03/18/2021   PLT 201 03/18/2021   NEUTROABS 4.4 03/18/2021     I discussed the assessment and treatment plan with the patient. The patient was provided an opportunity to ask questions and all were answered. The patient agreed with the plan and demonstrated an understanding of the instructions. The patient was advised to call back or seek an in-person evaluation if the symptoms worsen or if the condition fails to improve as anticipated.    I spent 20 minutes for the appointment reviewing test results, discuss management and coordination of care.  [name], MD 03/18/2021 3:44 PM

## 2021-03-18 NOTE — Telephone Encounter (Signed)
CRITICAL VALUE STICKER  CRITICAL VALUE: ALT: [phone]  RECEIVER (on-site recipient of call): Patty Sermons, RN   [place] NOTIFIED: 03/18/2021 @ 0934  MESSENGER (representative from lab): Suanne Marker  MD NOTIFIED: Dr. Alvy Bimler  TIME OF NOTIFICATION: 03/18/2021 @ 6648  RESPONSE:  MD aware. No new orders at this time.

## 2021-03-18 NOTE — Assessment & Plan Note (Signed)
She has profound elevated liver enzymes of unknown etiology I suspect it could be related to anesthesia or previous antibiotics Majority of the liver enzymes are trending down except her bilirubin is increased She has no new signs or symptoms to suggest acute cholecystitis I will order ultrasound right upper quadrant for evaluation She is instructed to avoid acetaminophen and to drink plenty of fluids We will repeat liver enzymes before I see her on Friday

## 2021-03-21 ENCOUNTER — Telehealth: Payer: Self-pay | Admitting: Oncology

## 2021-03-21 NOTE — Telephone Encounter (Signed)
Robin Sellers of appointment for [place] on 03/23/2021 at 8:30 (arrival at 8:15, NPO p MN) followed by labs at 9:45 at the cancer center.  She verbalized understanding and agreement of appointments and instructions.

## 2021-03-23 ENCOUNTER — Inpatient Hospital Stay: Payer: Medicare Other

## 2021-03-23 ENCOUNTER — Other Ambulatory Visit: Payer: Self-pay

## 2021-03-23 ENCOUNTER — Ambulatory Visit (HOSPITAL_COMMUNITY)
Admission: RE | Admit: 2021-03-23 | Discharge: 2021-03-23 | Disposition: A | Discharge status: Home / Self Care | Payer: Medicare Other | Source: Ambulatory Visit | Attending: Hematology and Oncology | Admitting: Hematology and Oncology

## 2021-03-23 DIAGNOSIS — C482 Malignant neoplasm of peritoneum, unspecified: Secondary | ICD-10-CM | POA: Diagnosis not present

## 2021-03-23 DIAGNOSIS — R748 Abnormal levels of other serum enzymes: Secondary | ICD-10-CM | POA: Diagnosis present

## 2021-03-23 LAB — CMP (CANCER CENTER ONLY)
ALT: 162 U/L — ABNORMAL HIGH (ref 0–44)
AST: 61 U/L — ABNORMAL HIGH (ref 15–41)
Albumin: 4.2 g/dL (ref 3.5–5.0)
Alkaline Phosphatase: 241 U/L — ABNORMAL HIGH (ref 38–126)
Anion gap: 4 — ABNORMAL LOW (ref 5–15)
BUN: 24 mg/dL — ABNORMAL HIGH (ref 8–23)
CO2: 30 mmol/L (ref 22–32)
Calcium: 9.2 mg/dL (ref 8.9–10.3)
Chloride: 105 mmol/L (ref 98–111)
Creatinine: 0.78 mg/dL (ref 0.44–1.00)
GFR, Estimated: >60 mL/min (ref >60)
Glucose, Bld: 109 mg/dL — ABNORMAL HIGH (ref 70–99)
Potassium: 4.6 mmol/L (ref 3.5–5.1)
Sodium: 139 mmol/L (ref 135–145)
Total Bilirubin: 1.2 mg/dL (ref 0.3–1.2)
Total Protein: 7.2 g/dL (ref 6.5–8.1)

## 2021-03-23 LAB — CBC WITH DIFFERENTIAL (CANCER CENTER ONLY)
Abs Immature Granulocytes: 0.03 10*3/uL (ref 0.00–0.07)
Basophils Absolute: 0 10*3/uL (ref 0.0–0.1)
Basophils Relative: 1 %
Eosinophils Absolute: 0.7 10*3/uL — ABNORMAL HIGH (ref 0.0–0.5)
Eosinophils Relative: 12 %
HCT: 40.1 % (ref 36.0–46.0)
Hemoglobin: 12.8 g/dL (ref 12.0–15.0)
Immature Granulocytes: 1 %
Lymphocytes Relative: 22 %
Lymphs Abs: 1.2 10*3/uL (ref 0.7–4.0)
MCH: 30.3 pg (ref 26.0–34.0)
MCHC: 31.9 g/dL (ref 30.0–36.0)
MCV: 95 fL (ref 80.0–100.0)
Monocytes Absolute: 0.4 10*3/uL (ref 0.1–1.0)
Monocytes Relative: 7 %
Neutro Abs: 3.1 10*3/uL (ref 1.7–7.7)
Neutrophils Relative %: 57 %
Platelet Count: 175 10*3/uL (ref 150–400)
RBC: 4.22 MIL/uL (ref 3.87–5.11)
RDW: 13.9 % (ref 11.5–15.5)
WBC Count: 5.5 10*3/uL (ref 4.0–10.5)
nRBC: 0 % (ref 0.0–0.2)

## 2021-03-24 ENCOUNTER — Encounter: Payer: Self-pay | Admitting: Gynecologic Oncology

## 2021-03-25 ENCOUNTER — Encounter: Payer: Self-pay | Admitting: Gynecologic Oncology

## 2021-03-25 ENCOUNTER — Encounter: Payer: Self-pay | Admitting: Hematology and Oncology

## 2021-03-25 ENCOUNTER — Inpatient Hospital Stay: Payer: Medicare Other | Attending: Gynecologic Oncology | Admitting: Gynecologic Oncology

## 2021-03-25 ENCOUNTER — Inpatient Hospital Stay: Payer: Medicare Other

## 2021-03-25 ENCOUNTER — Other Ambulatory Visit: Payer: Self-pay

## 2021-03-25 ENCOUNTER — Inpatient Hospital Stay (HOSPITAL_BASED_OUTPATIENT_CLINIC_OR_DEPARTMENT_OTHER): Payer: Medicare Other | Admitting: Hematology and Oncology

## 2021-03-25 VITALS — BP 138/55 | HR 83 | Temp 97.9°F | Resp 16 | Ht 64.0 in | Wt 114.0 lb

## 2021-03-25 DIAGNOSIS — Z7189 Other specified counseling: Secondary | ICD-10-CM

## 2021-03-25 DIAGNOSIS — R7401 Elevation of levels of liver transaminase levels: Secondary | ICD-10-CM

## 2021-03-25 DIAGNOSIS — R748 Abnormal levels of other serum enzymes: Secondary | ICD-10-CM | POA: Insufficient documentation

## 2021-03-25 DIAGNOSIS — M25561 Pain in right knee: Secondary | ICD-10-CM

## 2021-03-25 DIAGNOSIS — C482 Malignant neoplasm of peritoneum, unspecified: Secondary | ICD-10-CM

## 2021-03-25 DIAGNOSIS — D72819 Decreased white blood cell count, unspecified: Secondary | ICD-10-CM | POA: Insufficient documentation

## 2021-03-25 DIAGNOSIS — M25562 Pain in left knee: Secondary | ICD-10-CM | POA: Diagnosis not present

## 2021-03-25 DIAGNOSIS — Z90710 Acquired absence of both cervix and uterus: Secondary | ICD-10-CM

## 2021-03-25 DIAGNOSIS — Z90722 Acquired absence of ovaries, bilateral: Secondary | ICD-10-CM

## 2021-03-25 MED ORDER — SODIUM CHLORIDE 0.9 % IV SOLN
406.2000 mg | Freq: Once | INTRAVENOUS | Status: AC
  Administered 2021-03-25: 410 mg via INTRAVENOUS
  Filled 2021-03-25: qty 41

## 2021-03-25 MED ORDER — DIPHENHYDRAMINE HCL 50 MG/ML IJ SOLN
25.0000 mg | Freq: Once | INTRAMUSCULAR | Status: AC
  Administered 2021-03-25: 25 mg via INTRAVENOUS
  Filled 2021-03-25: qty 1

## 2021-03-25 MED ORDER — SODIUM CHLORIDE 0.9 % IV SOLN
150.0000 mg | Freq: Once | INTRAVENOUS | Status: AC
  Administered 2021-03-25: 150 mg via INTRAVENOUS
  Filled 2021-03-25: qty 150

## 2021-03-25 MED ORDER — FAMOTIDINE 20 MG IN NS 100 ML IVPB
20.0000 mg | Freq: Once | INTRAVENOUS | Status: AC
  Administered 2021-03-25: 20 mg via INTRAVENOUS
  Filled 2021-03-25: qty 100

## 2021-03-25 MED ORDER — SODIUM CHLORIDE 0.9 % IV SOLN
10.0000 mg | Freq: Once | INTRAVENOUS | Status: AC
  Administered 2021-03-25: 10 mg via INTRAVENOUS
  Filled 2021-03-25: qty 10

## 2021-03-25 MED ORDER — SODIUM CHLORIDE 0.9 % IV SOLN: Freq: Once | INTRAVENOUS | Status: AC

## 2021-03-25 MED ORDER — SODIUM CHLORIDE 0.9 % IV SOLN
175.0000 mg/m2 | Freq: Once | INTRAVENOUS | Status: AC
  Administered 2021-03-25: 264 mg via INTRAVENOUS
  Filled 2021-03-25: qty 44

## 2021-03-25 MED ORDER — PALONOSETRON HCL INJECTION 0.25 MG/5ML
0.2500 mg | Freq: Once | INTRAVENOUS | Status: AC
  Administered 2021-03-25: 0.25 mg via INTRAVENOUS
  Filled 2021-03-25: qty 5

## 2021-03-25 NOTE — Assessment & Plan Note (Signed)
I have reviewed final pathology Genetic testing is pending Recommend 2-3 cycles beyond surgery to complete treatment Her liver enzymes are improving and ultrasound of the liver is negative We will proceed with treatment today

## 2021-03-25 NOTE — Patient Instructions (Signed)
[facility]COL[facility]  Discharge Instructions: Thank you for choosing [facility] to provide your oncology and hematology care.   If you have a lab appointment with the Cancer Center, please go directly to the Cancer Center and check in at the registration area.   Wear comfortable clothing and clothing appropriate for easy access to any Portacath or PICC line.   We strive to give you quality time with your provider. You may need to reschedule your appointment if you arrive late (15 or more minutes).  Arriving late affects you and other patients whose appointments are after yours.  Also, if you miss three or more appointments without notifying the office, you may be dismissed from the clinic at the provider's discretion.      For prescription refill requests, have your pharmacy contact our office and allow 72 hours for refills to be completed.    Today you received the following chemotherapy and/or immunotherapy agents: Paclitaxel (Taxol) and Carboplatin.   To help prevent nausea and vomiting after your treatment, we encourage you to take your nausea medication as directed.  BELOW ARE SYMPTOMS THAT SHOULD BE REPORTED IMMEDIATELY: *FEVER GREATER THAN 100.4 F (38 C) OR HIGHER *CHILLS OR SWEATING *NAUSEA AND VOMITING THAT IS NOT CONTROLLED WITH YOUR NAUSEA MEDICATION *UNUSUAL SHORTNESS OF BREATH *UNUSUAL BRUISING OR BLEEDING *URINARY PROBLEMS (pain or burning when urinating, or frequent urination) *BOWEL PROBLEMS (unusual diarrhea, constipation, pain near the anus) TENDERNESS IN MOUTH AND THROAT WITH OR WITHOUT PRESENCE OF ULCERS (sore throat, sores in mouth, or a toothache) UNUSUAL RASH, SWELLING OR PAIN  UNUSUAL VAGINAL DISCHARGE OR ITCHING   Items with * indicate a potential emergency and should be followed up as soon as possible or go to the Emergency Department if any problems should occur.  Please show the CHEMOTHERAPY ALERT CARD or IMMUNOTHERAPY  ALERT CARD at check-in to the Emergency Department and triage nurse.  Should you have questions after your visit or need to cancel or reschedule your appointment, please contact [facility]  Dept: [phone]  and follow the prompts.  Office hours are 8:00 a.m. to 4:30 p.m. Monday - Friday. Please note that voicemails left after 4:00 p.m. may not be returned until the following business day.  We are closed weekends and major holidays. You have access to a nurse at all times for urgent questions. Please call the main number to the clinic Dept: [phone] and follow the prompts.   For any non-urgent questions, you may also contact your provider using MyChart. We now offer e-Visits for anyone 18 and older to request care online for non-urgent symptoms. For details visit [email or web address].   Also download the MyChart app! Go to the app store, search "MyChart", open the app, select [facility], and log in with your MyChart username and password.  Due to Covid, a mask is required upon entering the hospital/clinic. If you do not have a mask, one will be given to you upon arrival. For doctor visits, patients may have 1 support person aged 18 or older with them. For treatment visits, patients cannot have anyone with them due to current Covid guidelines and our immunocompromised population.   

## 2021-03-25 NOTE — Progress Notes (Signed)
[place] OFFICE PROGRESS NOTE  Patient Care Team: Lorene Dy, MD as PCP - General (Internal Medicine)  ASSESSMENT & PLAN:  Primary peritoneal carcinomatosis [facility]) I have reviewed final pathology Genetic testing is pending Recommend 2-3 cycles beyond surgery to complete treatment Her liver enzymes are improving and ultrasound of the liver is negative We will proceed with treatment today  Elevated liver enzymes The cause is unknown but could be related to exposure to general anesthesia It is improving Ultrasound of the liver is unremarkable We will proceed with treatment without delay  Arthralgia of both knees She would likely have recurrent arthralgia with chemotherapy I recommend avoidance of acetaminophen for now  No orders of the defined types were placed in this encounter.   All questions were answered. The patient knows to call the clinic with any problems, questions or concerns. The total time spent in the appointment was 30 minutes encounter with patients including review of chart and various tests results, discussions about plan of care and coordination of care plan   [name], MD 03/25/2021 8:59 AM  INTERVAL HISTORY: Please see below for problem oriented charting. 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 She recalls arthralgias within a few days after chemotherapy from the past of which she takes Claritin but rarely takes acetaminophen  REVIEW OF SYSTEMS:   Constitutional: Denies fevers, chills or abnormal weight loss Eyes: Denies blurriness of vision Ears, nose, mouth, throat, and face: Denies mucositis or sore throat Respiratory: Denies cough, dyspnea or  wheezes Cardiovascular: Denies palpitation, chest discomfort or lower extremity swelling Gastrointestinal:  Denies nausea, heartburn or change in bowel habits Skin: Denies abnormal skin rashes Lymphatics: Denies new lymphadenopathy or easy bruising Neurological:Denies numbness, tingling or new weaknesses Behavioral/Psych: Mood is stable, no new changes  All other systems were reviewed with the patient and are negative.  I have reviewed the past medical history, past surgical history, social history and family history with the patient and they are unchanged from previous note.  ALLERGIES:  is allergic to artichoke [cynara scolymus (artichoke)].  MEDICATIONS:  Current Outpatient Medications  Medication Sig Dispense Refill   Biotin 5000 MCG TABS Take 5,000 mcg by mouth daily. (Patient not taking: Reported on 03/24/2021)     Calcium Carbonate-Vitamin D 600-400 MG-UNIT tablet Take 1 tablet by mouth daily. (Patient not taking: Reported on 03/24/2021)     Cholecalciferol 50 MCG (2000 UT) CAPS Take 2,000 Units by mouth daily. (Patient not taking: Reported on 03/24/2021)     dexamethasone (DECADRON) 4 MG tablet Take 2 tabs at the night before and 2 tab the morning of chemotherapy, every 3 weeks, by mouth x 6 cycles (Patient not taking: Reported on 03/24/2021) 36 tablet 6   Multiple Vitamins-Minerals (MULTIVITAMIN WITH MINERALS) tablet Take 1 tablet by mouth daily. (Patient not taking: Reported on 03/24/2021)     ondansetron (ZOFRAN) 8 MG tablet Take 1 tablet (8 mg total) by mouth every 8 (eight) hours as needed for refractory nausea / vomiting. (Patient not taking: Reported on 03/24/2021) 30 tablet 1   Polyvinyl Alcohol-Povidone (CLEAR EYES NATURAL TEARS) 5-6 MG/ML SOLN Place 1 drop into both eyes daily as needed (Dry eyes). (Patient not taking: Reported on 03/24/2021)  prochlorperazine (COMPAZINE) 10 MG tablet Take 1 tablet (10 mg total) by mouth every 6 (six) hours as needed (Nausea or vomiting).  (Patient not taking: Reported on 03/24/2021) 30 tablet 1   risedronate (ACTONEL) 150 MG tablet Take 150 mg by mouth every 30 (thirty) days. (Patient not taking: Reported on 03/24/2021)     senna-docusate (SENOKOT-S) 8.6-50 MG tablet Take 2 tablets by mouth at bedtime. For AFTER surgery, do not take if having diarrhea (Patient not taking: Reported on 03/24/2021) 30 tablet 0   traMADol (ULTRAM) 50 MG tablet Take 1 tablet (50 mg total) by mouth every 6 (six) hours as needed for severe pain. For AFTER surgery only, do not take and drive (Patient not taking: Reported on 03/24/2021) 10 tablet 0   tretinoin (RETIN-A) 0.025 % cream Apply 1 application topically at bedtime as needed (blemishes). (Patient not taking: Reported on 03/24/2021)     No current facility-administered medications for this visit.   Facility-Administered Medications Ordered in Other Visits  Medication Dose Route Frequency Provider Last Rate Last Admin   CARBOplatin (PARAPLATIN) 410 mg in sodium chloride 0.9 % 250 mL chemo infusion  410 mg Intravenous Once Alvy Bimler, Romulo Okray, MD       dexamethasone (DECADRON) 10 mg in sodium chloride 0.9 % 50 mL IVPB  10 mg Intravenous Once Alvy Bimler, Emilynn Srinivasan, MD       diphenhydrAMINE (BENADRYL) injection 25 mg  25 mg Intravenous Once Alvy Bimler, Selyna Klahn, MD       famotidine (PEPCID) IVPB 20 mg in NS 100 mL IVPB  20 mg Intravenous Once Alvy Bimler, Aamina Skiff, MD       fosaprepitant (EMEND) 150 mg in sodium chloride 0.9 % 145 mL IVPB  150 mg Intravenous Once Alvy Bimler, Merida Alcantar, MD       PACLitaxel (TAXOL) 264 mg in sodium chloride 0.9 % 250 mL chemo infusion (> $RemoveBef'80mg'[id number]$ /m2)  175 mg/m2 (Treatment Plan Recorded) Intravenous Once [name], MD        SUMMARY OF ONCOLOGIC HISTORY: Oncology History  Primary peritoneal carcinomatosis ([place])  08/21/2020 Initial Diagnosis   The patient reports intermittent right upper quadrant pain that feel like she pulled a muscle beginning in March 2022.  In April, she woke up with sharp pain in her right upper  quadrant.  This was her first episode of sharp pain.  She thought that her pain was related to her gallbladder and presented to the emergency department.  She was seen in ED on 4/30. RUQ ultrasound and x-ray were normal as were LFTs and lipase.    10/27/2020 Imaging   Findings highly suspicious for peritoneal carcinomatosis. No site of primary malignancy identified.   Colonic diverticulosis, without radiographic evidence of diverticulitis   11/02/2020 Initial Diagnosis   Primary peritoneal carcinomatosis ([place])   11/02/2020 Tumor Marker   Patient's tumor was tested for the following markers: CA-125. Results of the tumor marker test revealed 88.   11/11/2020 Pathology Results   FINAL MICROSCOPIC DIAGNOSIS:   A. CUL DE SAC, LEFT ANTERIOR, EXCISION:  -  Poorly differentiated carcinoma  -  See comment   B. SIDEWALL, RIGHT, BIOPSY:  -  Poorly differentiated carcinoma  -  See comment   COMMENT:   By immunohistochemistry, the neoplastic cells are positive for cytokeratin 7, p53, PAX8 and WT1 but negative for cytokeratin 20, GATA3, CDX2 and TTF-1.  Overall, the immunophenotype is consistent with a  gynecologic primary.    11/11/2020 Pathology Results   FINAL MICROSCOPIC DIAGNOSIS:  - Malignant cells consistent with  metastatic adenocarcinoma    11/11/2020 Surgery   Pre-operative Diagnosis: Carcinomatosis, mildly elevated CA-125   Post-operative Diagnosis: same, At least stage IIIC gyn malignancy   Operation: Diagnostic laparoscopy, peritoneal biopsies    Surgeon: [name] Operative Findings: On EUA, small mobile uterus. On intra-abdominal entry, carcinomatosis appreciated studding the right anterior diaphragm, bilateral liver surfaces, mesentery, pelvic peritoneum. Omental cake with measuring up to 2x3cm. Right ovary adherent to the right uterus with peritoneal studding adjacent. Frondular implants noted along right pelvic sidewall. Miliary disease along anterior cul de sac. Cul  de sac covered with friable miliary disease. Small volume dark amber ascites. Specimens: left anterior cul de sac peritoneum, right pelvic sidewall peritoneal biopsy          11/15/2020 Cancer Staging   Staging form: Ovary, Fallopian Tube, and Primary Peritoneal Carcinoma, AJCC 8th Edition - Clinical stage from 11/15/2020: FIGO Stage III (cT3, cN0, cM0) - Signed by [name], MD on 11/15/2020 Stage prefix: Initial diagnosis    11/26/2020 -  Chemotherapy   Patient is on Treatment Plan : OVARIAN Carboplatin (AUC 6) / Paclitaxel (175) q21d x 6 cycles     11/26/2020 Tumor Marker   Patient's tumor was tested for the following markers: CA-125. Results of the tumor marker test revealed 76.8.   12/17/2020 Tumor Marker   Patient's tumor was tested for the following markers: CA-125. Results of the tumor marker test revealed 58.1.   01/07/2021 Tumor Marker   Patient's tumor was tested for the following markers: CA-125. Results of the tumor marker test revealed 21.6.   01/20/2021 Imaging   Decreased omental soft tissue nodularity and caking, consistent with interval improvement in carcinoma.   No evidence of new or progressive disease within the abdomen or pelvis.   Colonic diverticulosis. No radiographic evidence of diverticulitis.   Large stool burden noted; recommend clinical correlation for possible constipation   03/14/2021 Tumor Marker   Patient's tumor was tested for the following markers: CA-125. Results of the tumor marker test revealed 32.7.   03/23/2021 Imaging   Unremarkable right upper quadrant ultrasound     PHYSICAL EXAMINATION: ECOG PERFORMANCE STATUS: 1 - Symptomatic but completely ambulatory  Vitals:   03/25/21 0817  BP: 133/63  Pulse: 89  Resp: 18  Temp: (!) 97.5 F (36.4 C)  SpO2: 100%   Filed Weights   03/25/21 0817  Weight: 112 lb 9.6 oz (51.1 kg)    GENERAL:alert, no distress and comfortable SKIN: skin color, texture, turgor are normal, no rashes or  significant lesions EYES: normal, Conjunctiva are pink and non-injected, sclera clear OROPHARYNX:no exudate, no erythema and lips, buccal mucosa, and tongue normal  NECK: supple, thyroid normal size, non-tender, without nodularity LYMPH:  no palpable lymphadenopathy in the cervical, axillary or inguinal LUNGS: clear to auscultation and percussion with normal breathing effort HEART: regular rate & rhythm and no murmurs and no lower extremity edema ABDOMEN:abdomen soft, non-tender and normal bowel sounds.  Noted well-healed surgical scar Musculoskeletal:no cyanosis of digits and no clubbing  NEURO: alert & oriented x 3 with fluent speech, no focal motor/sensory deficits  LABORATORY DATA:  I have reviewed the data as listed    Component Value Date/Time   NA 139 03/23/2021 0904   K 4.6 03/23/2021 0904   CL 105 03/23/2021 0904   CO2 30 03/23/2021 0904   GLUCOSE 109 (H) 03/23/2021 0904   BUN 24 (H) 03/23/2021 0904   CREATININE 0.78 03/23/2021 0904   CALCIUM 9.2 03/23/2021 0904  PROT 7.2 03/23/2021 0904   ALBUMIN 4.2 03/23/2021 0904   AST 61 (H) 03/23/2021 0904   ALT 162 (H) 03/23/2021 0904   ALKPHOS 241 (H) 03/23/2021 0904   BILITOT 1.2 03/23/2021 0904   GFRNONAA >60 03/23/2021 0904   GFRAA  02/27/2007 1510    >60        The eGFR has been calculated using the MDRD equation. This calculation has not been validated in all clinical    No results found for: SPEP, UPEP  Lab Results  Component Value Date   WBC 5.5 03/23/2021   NEUTROABS 3.1 03/23/2021   HGB 12.8 03/23/2021   HCT 40.1 03/23/2021   MCV 95.0 03/23/2021   PLT 175 03/23/2021      Chemistry      Component Value Date/Time   NA 139 03/23/2021 0904   K 4.6 03/23/2021 0904   CL 105 03/23/2021 0904   CO2 30 03/23/2021 0904   BUN 24 (H) 03/23/2021 0904   CREATININE 0.78 03/23/2021 0904      Component Value Date/Time   CALCIUM 9.2 03/23/2021 0904   ALKPHOS 241 (H) 03/23/2021 0904   AST 61 (H) 03/23/2021 0904    ALT 162 (H) 03/23/2021 0904   BILITOT 1.2 03/23/2021 0904       RADIOGRAPHIC STUDIES: I have personally reviewed the radiological images as listed and agreed with the findings in the report. US Abdomen Limited RUQ (LIVER/GB)  Result Date: 03/24/2021 CLINICAL DATA:  Elevated LFTs. EXAM: ULTRASOUND ABDOMEN LIMITED RIGHT UPPER QUADRANT COMPARISON:  Ultrasound dated 08/21/2020 and CT dated 01/19/2021. FINDINGS: Gallbladder: No gallstones or wall thickening visualized. No sonographic Murphy sign noted by sonographer. Common bile duct: Diameter: 4 mm Liver: No focal lesion identified. Within normal limits in parenchymal echogenicity. Portal vein is patent on color Doppler imaging with normal direction of blood flow towards the liver. Other: None. IMPRESSION: Unremarkable right upper quadrant ultrasound. Electronically Signed   By: Anner Crete M.D.   On: 03/24/2021 02:01

## 2021-03-25 NOTE — Progress Notes (Signed)
Gynecologic Oncology Return Clinic Visit  03/25/21  Reason for Visit: Follow-up after surgery  Treatment History: Oncology History  Primary peritoneal carcinomatosis ([place])  08/21/2020 Initial Diagnosis   The patient reports intermittent right upper quadrant pain that feel like she pulled a muscle beginning in March 2022.  In April, she woke up with sharp pain in her right upper quadrant.  This was her first episode of sharp pain.  She thought that her pain was related to her gallbladder and presented to the emergency department.  She was seen in ED on 4/30. RUQ ultrasound and x-ray were normal as were LFTs and lipase.    10/27/2020 Imaging   Findings highly suspicious for peritoneal carcinomatosis. No site of primary malignancy identified.   Colonic diverticulosis, without radiographic evidence of diverticulitis   11/02/2020 Initial Diagnosis   Primary peritoneal carcinomatosis ([place])   11/02/2020 Tumor Marker   Patient's tumor was tested for the following markers: CA-125. Results of the tumor marker test revealed 88.   11/11/2020 Pathology Results   FINAL MICROSCOPIC DIAGNOSIS:   A. CUL DE SAC, LEFT ANTERIOR, EXCISION:  -  Poorly differentiated carcinoma  -  See comment   B. SIDEWALL, RIGHT, BIOPSY:  -  Poorly differentiated carcinoma  -  See comment   COMMENT:   By immunohistochemistry, the neoplastic cells are positive for cytokeratin 7, p53, PAX8 and WT1 but negative for cytokeratin 20, GATA3, CDX2 and TTF-1.  Overall, the immunophenotype is consistent with a  gynecologic primary.    11/11/2020 Pathology Results   FINAL MICROSCOPIC DIAGNOSIS:  - Malignant cells consistent with metastatic adenocarcinoma    11/11/2020 Surgery   Pre-operative Diagnosis: Carcinomatosis, mildly elevated CA-125   Post-operative Diagnosis: same, At least stage IIIC gyn malignancy   Operation: Diagnostic laparoscopy, peritoneal biopsies    Surgeon: [name] Operative Findings: On  EUA, small mobile uterus. On intra-abdominal entry, carcinomatosis appreciated studding the right anterior diaphragm, bilateral liver surfaces, mesentery, pelvic peritoneum. Omental cake with measuring up to 2x3cm. Right ovary adherent to the right uterus with peritoneal studding adjacent. Frondular implants noted along right pelvic sidewall. Miliary disease along anterior cul de sac. Cul de sac covered with friable miliary disease. Small volume dark amber ascites. Specimens: left anterior cul de sac peritoneum, right pelvic sidewall peritoneal biopsy          11/15/2020 Cancer Staging   Staging form: Ovary, Fallopian Tube, and Primary Peritoneal Carcinoma, AJCC 8th Edition - Clinical stage from 11/15/2020: FIGO Stage III (cT3, cN0, cM0) - Signed by [name], MD on 11/15/2020 Stage prefix: Initial diagnosis    11/26/2020 -  Chemotherapy   Patient is on Treatment Plan : OVARIAN Carboplatin (AUC 6) / Paclitaxel (175) q21d x 6 cycles     11/26/2020 Tumor Marker   Patient's tumor was tested for the following markers: CA-125. Results of the tumor marker test revealed 76.8.   12/17/2020 Tumor Marker   Patient's tumor was tested for the following markers: CA-125. Results of the tumor marker test revealed 58.1.   01/07/2021 Tumor Marker   Patient's tumor was tested for the following markers: CA-125. Results of the tumor marker test revealed 21.6.   01/20/2021 Imaging   Decreased omental soft tissue nodularity and caking, consistent with interval improvement in carcinoma.   No evidence of new or progressive disease within the abdomen or pelvis.   Colonic diverticulosis. No radiographic evidence of diverticulitis.   Large stool burden noted; recommend clinical correlation for possible constipation  03/14/2021 Tumor Marker   Patient's tumor was tested for the following markers: CA-125. Results of the tumor marker test revealed 32.7.   03/23/2021 Imaging   Unremarkable right upper quadrant  ultrasound     Interval History: Presents today for follow-up after her surgery.  She Lost appetite for several days, finally returning.  Thinks that she has lost a little bit of weight.  Denies any nausea or emesis.  Reports regular bowel and bladder function.  Had very scant vaginal bleeding initially after surgery, denies any further bleeding or discharge.  Past Medical/Surgical History: Past Medical History:  Diagnosis Date   Anemia    Cancer ([address])    Family history of breast cancer 03/14/2021   Hole, retinal    Right eye   Osteoporosis    Polyp of colon, unspecified part of colon, unspecified type    Benign    Past Surgical History:  Procedure Laterality Date   APPENDECTOMY  02/27/2007   [facility]   BILATERAL SALPINGECTOMY Right 03/1978   right ectopic, tied other tube   CATARACT EXTRACTION Right 10/31/2011   Dr. Katy Fitch   COLONOSCOPY  03/2019   Dr. Alessandra Bevels   COLONOSCOPY WITH PROPOFOL N/A 04/01/2018   Procedure: COLONOSCOPY WITH PROPOFOL;  Surgeon: Otis Brace, MD;  Location: [facility];  Service: Gastroenterology;  Laterality: N/A;   DILATION AND CURETTAGE OF UTERUS     x3, 2 for miscarriages, one for polyp vs fibroid   HAND / FINGER LESION EXCISION Left    lt. index finger   LAPAROSCOPY N/A 11/11/2020   Procedure: LAPAROSCOPY DIAGNOSTIC WITH PERITONEAL BIOPSY;  Surgeon: [name], MD;  Location: [facility];  Service: Gynecology;  Laterality: N/A;   POLYPECTOMY  04/01/2018   Procedure: POLYPECTOMY;  Surgeon: Otis Brace, MD;  Location: [facility];  Service: Gastroenterology;;   RETINAL TEAR REPAIR CRYOTHERAPY Right 04/25/2011   Dr. Dominic Pea INJECTION  04/01/2018   Procedure: SUBMUCOSAL INJECTION;  Surgeon: Otis Brace, MD;  Location: [facility];  Service: Gastroenterology;;   TUBAL LIGATION Left 03/1978    Family History  Problem Relation Age of Onset   Lung cancer Mother 70       smoking hx   Skin cancer Father        dx  after 38; non-melanoma; scalp   Lung cancer Maternal Aunt        dx after 53; smoking hx   Breast cancer Daughter 25       ER+   Skin cancer Cousin        face; surgery only   Ovarian cancer Neg Hx    Endometrial cancer Neg Hx    Prostate cancer Neg Hx    Pancreatic cancer Neg Hx    Colon cancer Neg Hx     Social History   Socioeconomic History   Marital status: Legally Separated    Spouse name: Not on file   Number of children: Not on file   Years of education: Not on file   Highest education level: Not on file  Occupational History   Not on file  Tobacco Use   Smoking status: Never    Passive exposure: Past (18 years)   Smokeless tobacco: Never  Vaping Use   Vaping Use: Never used  Substance and Sexual Activity   Alcohol use: Not Currently   Drug use: Never   Sexual activity: Not Currently  Other Topics Concern   Not on file  Social History Narrative   Not  on file   Social Determinants of Health   Financial Resource Strain: Not on file  Food Insecurity: Not on file  Transportation Needs: Not on file  Physical Activity: Not on file  Stress: Not on file  Social Connections: Not on file    Current Medications:  Current Outpatient Medications:    Biotin 5000 MCG TABS, Take 5,000 mcg by mouth daily. (Patient not taking: Reported on 03/24/2021), Disp: , Rfl:    Calcium Carbonate-Vitamin D 600-400 MG-UNIT tablet, Take 1 tablet by mouth daily. (Patient not taking: Reported on 03/24/2021), Disp: , Rfl:    Cholecalciferol 50 MCG (2000 UT) CAPS, Take 2,000 Units by mouth daily. (Patient not taking: Reported on 03/24/2021), Disp: , Rfl:    dexamethasone (DECADRON) 4 MG tablet, Take 2 tabs at the night before and 2 tab the morning of chemotherapy, every 3 weeks, by mouth x 6 cycles (Patient not taking: Reported on 03/24/2021), Disp: 36 tablet, Rfl: 6   Multiple Vitamins-Minerals (MULTIVITAMIN WITH MINERALS) tablet, Take 1 tablet by mouth daily. (Patient not taking: Reported  on 03/24/2021), Disp: , Rfl:    ondansetron (ZOFRAN) 8 MG tablet, Take 1 tablet (8 mg total) by mouth every 8 (eight) hours as needed for refractory nausea / vomiting. (Patient not taking: Reported on 03/24/2021), Disp: 30 tablet, Rfl: 1   Polyvinyl Alcohol-Povidone (CLEAR EYES NATURAL TEARS) 5-6 MG/ML SOLN, Place 1 drop into both eyes daily as needed (Dry eyes). (Patient not taking: Reported on 03/24/2021), Disp: , Rfl:    prochlorperazine (COMPAZINE) 10 MG tablet, Take 1 tablet (10 mg total) by mouth every 6 (six) hours as needed (Nausea or vomiting). (Patient not taking: Reported on 03/24/2021), Disp: 30 tablet, Rfl: 1   risedronate (ACTONEL) 150 MG tablet, Take 150 mg by mouth every 30 (thirty) days. (Patient not taking: Reported on 03/24/2021), Disp: , Rfl:    senna-docusate (SENOKOT-S) 8.6-50 MG tablet, Take 2 tablets by mouth at bedtime. For AFTER surgery, do not take if having diarrhea (Patient not taking: Reported on 03/24/2021), Disp: 30 tablet, Rfl: 0   traMADol (ULTRAM) 50 MG tablet, Take 1 tablet (50 mg total) by mouth every 6 (six) hours as needed for severe pain. For AFTER surgery only, do not take and drive (Patient not taking: Reported on 03/24/2021), Disp: 10 tablet, Rfl: 0   tretinoin (RETIN-A) 0.025 % cream, Apply 1 application topically at bedtime as needed (blemishes). (Patient not taking: Reported on 03/24/2021), Disp: , Rfl:   Review of Systems: Pertinent positives as per HPI. Denies fevers, chills, fatigue, unexplained weight changes. Denies hearing loss, neck lumps or masses, mouth sores, ringing in ears or voice changes. Denies cough or wheezing.  Denies shortness of breath. Denies chest pain or palpitations. Denies leg swelling. Denies abdominal distention, pain, blood in stools, constipation, diarrhea, nausea, vomiting, or early satiety. Denies pain with intercourse, dysuria, frequency, hematuria or incontinence. Denies hot flashes, pelvic pain, vaginal bleeding or vaginal  discharge.   Denies joint pain, back pain or muscle pain/cramps. Denies itching, rash, or wounds. Denies dizziness, headaches, numbness or seizures. Denies swollen lymph nodes or glands, denies easy bruising or bleeding. Denies anxiety, depression, confusion, or decreased concentration.  Physical Exam: BP (!) 138/55 (BP Location: Left Arm, Patient Position: Sitting)   Pulse 83   Temp 97.9 F (36.6 C) (Tympanic)   Resp 16   Ht 5' 4" (1.626 m)   Wt 114 lb (51.7 kg)   SpO2 100%   BMI 19.57 kg/m  General: Alert,  oriented, no acute distress. HEENT: Normocephalic, atraumatic, sclera anicteric. Chest: Clear to auscultation bilaterally.  No wheezes or rhonchi. Cardiovascular: Regular rate and rhythm, no murmurs. Abdomen: soft, nontender.  Normoactive bowel sounds.  No masses or hepatosplenomegaly appreciated.  Well-healed incisions, remaining Dermabond removed. Extremities: Grossly normal range of motion.  Warm, well perfused.  No edema bilaterally. Skin: No rashes or lesions noted. Lymphatics: No cervical, supraclavicular, or inguinal adenopathy. GU: Normal appearing external genitalia without erythema, excoriation, or lesions.  Speculum exam reveals mild petechia of the upper vagina that oozes slightly with speculum manipulation, no significant bleeding.  Cuff intact, suture still visible.  Bimanual exam reveals cuff intact, no fluctuance or tenderness with palpation.  Laboratory & Radiologic Studies: Surgical pathology from 06-Mar-2023: A. PERTIONEAL SCAR, EXCISION:  - Microscopic focus of high-grade serous carcinoma   B. 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 OMENTUM, RESECTION:  - Microscopic foci of high-grade serous carcinoma  Assessment & Plan: Robin Sellers is a 70 y.o. 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  Presenting today for follow-up after surgery, for cycle of adjuvant chemotherapy.   Patient is doing very well from a postoperative standpoint.  She is meeting all postoperative milestones.  Discussed continued postoperative expectations as well as limitations.   We reviewed her pathology report again in detail today and the patient was given a paper copy of this.  There was minimal residual disease, most of which outside of her ovaries and fallopian tube was microscopic.  Plan is for an additional 2 or 3 cycles of adjuvant therapy.  Patient has now met with genetics and has genetic testing pending.  She and I previously discussed and we discussed again today that if genetic or tumor testing reveals either a mutation or homologous repair deficiency, then the patient would be a candidate for maintenance treatment.  Today we also discussed briefly surveillance plan after she finishes chemotherapy which involves repeat CT scan to assess treatment response followed by surveillance visits every 3 months alternating between medical oncology and our clinic with exam and CA125 testing.  Patient had fairly impressive transaminitis following surgery.  It is unclear to me whether this was related to anesthesia or medications that she took in the postoperative period.  This has improved significantly and ultrasound of her liver showed no focal abnormality.   32 minutes of total time was spent for this patient encounter, including preparation, face-to-face counseling with the patient and coordination of care, and documentation of the encounter.  Jeral Pinch, MD  Division of Gynecologic Oncology  Department of Obstetrics and Gynecology  [facility]

## 2021-03-25 NOTE — Assessment & Plan Note (Signed)
She would likely have recurrent arthralgia with chemotherapy I recommend avoidance of acetaminophen for now

## 2021-03-25 NOTE — Assessment & Plan Note (Signed)
The cause is unknown but could be related to exposure to general anesthesia It is improving Ultrasound of the liver is unremarkable We will proceed with treatment without delay

## 2021-03-25 NOTE — Patient Instructions (Signed)
Good to see you!  You are healing very well.  Remember no lifting, pushing, or pulling more than 10 pounds until 6 weeks after surgery.  Nothing in the vagina until 8.  I will see you about 3 months after you finish chemotherapy.

## 2021-04-05 ENCOUNTER — Ambulatory Visit: Payer: Self-pay | Admitting: Genetic Counselor

## 2021-04-05 ENCOUNTER — Telehealth: Payer: Self-pay | Admitting: Genetic Counselor

## 2021-04-05 ENCOUNTER — Encounter: Payer: Self-pay | Admitting: Genetic Counselor

## 2021-04-05 DIAGNOSIS — Z1379 Encounter for other screening for genetic and chromosomal anomalies: Secondary | ICD-10-CM | POA: Insufficient documentation

## 2021-04-05 DIAGNOSIS — Z803 Family history of malignant neoplasm of breast: Secondary | ICD-10-CM

## 2021-04-05 DIAGNOSIS — C482 Malignant neoplasm of peritoneum, unspecified: Secondary | ICD-10-CM

## 2021-04-05 NOTE — Telephone Encounter (Signed)
Revealed negative genetic testing for somatic and tumor testing.  Discussed that we do not know why she has peritoneal cancer or why there is cancer in the family. It could be familial, due to a different gene that we are not testing, or maybe our current technology may not be able to pick something up.  It will be important for her to keep in contact with genetics to keep up with whether additional testing may be needed.

## 2021-04-05 NOTE — Progress Notes (Signed)
HPI:   Robin Sellers was previously seen in the [facility] clinic due to a personal history of peritoneal and concerns regarding a hereditary predisposition to cancer. Please refer to our prior cancer genetics clinic note for more information regarding our discussion, assessment and recommendations, at the time. Robin Sellers recent genetic test results were disclosed to Robin, as were recommendations warranted by these results. These results and recommendations are discussed in more detail below.  CANCER HISTORY:  Oncology History  Primary peritoneal carcinomatosis ([place])  08/21/2020 Initial Diagnosis   The patient reports intermittent right upper quadrant pain that feel like she pulled a muscle beginning in March 2022.  In April, she woke up with sharp pain in Robin right upper quadrant.  This was Robin first episode of sharp pain.  She thought that Robin pain was related to Robin gallbladder and presented to the emergency department.  She was seen in ED on 4/30. RUQ ultrasound and x-ray were normal as were LFTs and lipase.    10/27/2020 Imaging   Findings highly suspicious for peritoneal carcinomatosis. No site of primary malignancy identified.   Colonic diverticulosis, without radiographic evidence of diverticulitis   11/02/2020 Initial Diagnosis   Primary peritoneal carcinomatosis ([place])   11/02/2020 Tumor Marker   Patient's tumor was tested for the following markers: CA-125. Results of the tumor marker test revealed 88.   11/11/2020 Pathology Results   FINAL MICROSCOPIC DIAGNOSIS:   A. CUL DE SAC, LEFT ANTERIOR, EXCISION:  -  Poorly differentiated carcinoma  -  See comment   B. SIDEWALL, RIGHT, BIOPSY:  -  Poorly differentiated carcinoma  -  See comment   COMMENT:   By immunohistochemistry, the neoplastic cells are positive for cytokeratin 7, p53, PAX8 and WT1 but negative for cytokeratin 20, GATA3, CDX2 and TTF-1.  Overall, the immunophenotype is consistent with a   gynecologic primary.    11/11/2020 Pathology Results   FINAL MICROSCOPIC DIAGNOSIS:  - Malignant cells consistent with metastatic adenocarcinoma    11/11/2020 Surgery   Pre-operative Diagnosis: Carcinomatosis, mildly elevated CA-125   Post-operative Diagnosis: same, At least stage IIIC gyn malignancy   Operation: Diagnostic laparoscopy, peritoneal biopsies    Surgeon: [name] Operative Findings: On EUA, small mobile uterus. On intra-abdominal entry, carcinomatosis appreciated studding the right anterior diaphragm, bilateral liver surfaces, mesentery, pelvic peritoneum. Omental cake with measuring up to 2x3cm. Right ovary adherent to the right uterus with peritoneal studding adjacent. Frondular implants noted along right pelvic sidewall. Miliary disease along anterior cul de sac. Cul de sac covered with friable miliary disease. Small volume dark amber ascites. Specimens: left anterior cul de sac peritoneum, right pelvic sidewall peritoneal biopsy          11/15/2020 Cancer Staging   Staging form: Ovary, Fallopian Tube, and Primary Peritoneal Carcinoma, AJCC 8th Edition - Clinical stage from 11/15/2020: FIGO Stage III (cT3, cN0, cM0) - Signed by [name], MD on 11/15/2020 Stage prefix: Initial diagnosis    11/26/2020 -  Chemotherapy   Patient is on Treatment Plan : OVARIAN Carboplatin (AUC 6) / Paclitaxel (175) q21d x 6 cycles     11/26/2020 Tumor Marker   Patient's tumor was tested for the following markers: CA-125. Results of the tumor marker test revealed 76.8.   12/17/2020 Tumor Marker   Patient's tumor was tested for the following markers: CA-125. Results of the tumor marker test revealed 58.1.   01/07/2021 Tumor Marker   Patient's tumor was tested for the following markers:  CA-125. Results of the tumor marker test revealed 21.6.   01/20/2021 Imaging   Decreased omental soft tissue nodularity and caking, consistent with interval improvement in carcinoma.   No  evidence of new or progressive disease within the abdomen or pelvis.   Colonic diverticulosis. No radiographic evidence of diverticulitis.   Large stool burden noted; recommend clinical correlation for possible constipation   03/14/2021 Tumor Marker   Patient's tumor was tested for the following markers: CA-125. Results of the tumor marker test revealed 32.7.   03/23/2021 Imaging   Unremarkable right upper quadrant ultrasound   04/01/2021 Genetic Testing   HRD status through [facility] was negative.  The report date is 04/01/2021. Negative hereditary cancer genetic testing: no pathogenic variants detected in [facility].  The report date is 04/04/2021.  The [facility] gene panel offered by [employer] includes sequencing and deletion/duplication testing of the following 48 genes: APC, ATM, AXIN2, BAP1, BARD1, BMPR1A, BRCA1, BRCA2, BRIP1, CHD1, CDK4, CDKN2A(p16 and p14ARF), CHEK2, CTNNA1, EGFR, EPCAM, FH, FLCN, GREM1, HOXB13, MEN1, MET, MITF , MLH1, MSH2, MSH3, MSH6, MUTYH, NTHL1, PALB2, PMS2, POLD1, POLE, PTEN, RAD51C, RAD51D, RET, SDHA, SDHB, SDHC, SDHD, SMAD4, STK11,TERT, TP53, TSC1, TSC2, and VHL.     FAMILY HISTORY:  We obtained a detailed, 4-generation family history.  Significant diagnoses are listed below: Family History  Problem Relation Age of Onset   Lung cancer Mother 81       smoking hx   Skin cancer Father        dx after 22; non-melanoma; scalp   Lung cancer Maternal Aunt        dx after 27; smoking hx   Breast cancer Sellers 53       ER+   Skin cancer Cousin        face; surgery only   Ovarian cancer Neg Hx    Endometrial cancer Neg Hx    Prostate cancer Neg Hx    Pancreatic cancer Neg Hx    Colon cancer Neg Hx       Robin Sellers's Sellers had reportedly had negative genetic testing in 2019. Robin Sellers is unaware of previous family history of genetic testing for hereditary cancer risks besides that mentioned above. There is no reported  Ashkenazi Jewish ancestry. There is no known consanguinity.    GENETIC TEST RESULTS:  The [facility] My[facility] Panel found no pathogenic mutations. The [facility] gene panel offered by [employer] includes sequencing and deletion/duplication testing of the following 48 genes: APC, ATM, AXIN2, BAP1, BARD1, BMPR1A, BRCA1, BRCA2, BRIP1, CHD1, CDK4, CDKN2A(p16 and p14ARF), CHEK2, CTNNA1, EGFR, EPCAM, FH, FLCN, GREM1, HOXB13, MEN1, MET, MITF , MLH1, MSH2, MSH3, MSH6, MUTYH, NTHL1, PALB2, PMS2, POLD1, POLE, PTEN, RAD51C, RAD51D, RET, SDHA, SDHB, SDHC, SDHD, SMAD4, STK11,TERT, TP53, TSC1, TSC2, and VHL.    The test report has been scanned into EPIC and is located under the Molecular Pathology section of the Results Review tab.  A portion of the result report is included below for reference. Genetic testing reported out on April 04, 2021.        One goal of Robin Sellers's genetic testing was to identify if she may be a candidate for targeted treatment with PARP inhibitors.  [facility] assesses somatic mutations status of BRCA1 and BRCA2 genes and the determination of Genomic Instability Score (GIS), which is an algorithmic measurement of Loss of Heterozygosity (LOH), Telomeric Allelic Imbalance (TAI), and Large-scale State Transitions (LST). No BRCA1/2 mutations were detected in  Robin Sellers's tumor.  Robin GIS status was negative at 49.   The report date is April 01, 2021. Because Robin test did not detect any pathogenic variants in Robin germline or somatic (tumor) testing, there are no targeted treatment recommendations based on these results.     Even though a pathogenic variant was not identified, possible explanations for the cancer in the family may include: There may be no hereditary risk for cancer in the family. The cancers in Robin Sellers and/or Robin family may be sporadic/familial or due to other genetic and environmental factors. There may be a gene mutation in one of these  genes that current testing methods cannot detect but that chance is small. There could be another gene that has not yet been discovered, or that we have not yet tested, that is responsible for the cancer diagnoses in the family.  It is also possible there is a hereditary cause for the cancer in the family that Robin Sellers did not inherit.   Therefore, it is important to remain in touch with cancer genetics in the future so that we can continue to offer Robin Sellers the most up to date genetic testing.   ADDITIONAL GENETIC TESTING:  We discussed with Robin Sellers that Robin genetic testing was fairly extensive.  If there are other relevant genes identified to increase cancer risk that can be analyzed in the future, we would be happy to discuss and coordinate this testing at that time.    CANCER SCREENING RECOMMENDATIONS:  Robin Sellers test result is considered negative (normal).  This means that we have not identified a hereditary cause for Robin personal history of peritoneal cancer at this time.   An individual's cancer risk and medical management are not determined by genetic test results alone. Overall cancer risk assessment incorporates additional factors, including personal medical history, family history, and any available genetic information that may result in a personalized plan for cancer prevention and surveillance. Therefore, it is recommended she continue to follow the cancer management and screening guidelines provided by Robin oncology and primary healthcare provider.  RECOMMENDATIONS FOR FAMILY MEMBERS:   Since she did not inherit a identifiable mutation in a cancer predisposition gene included on this panel, Robin children could not have inherited a known mutation from Robin in one of these genes. Individuals in this family might be at some increased risk of developing cancer, over the general population risk, due to the family history of cancer.  Individuals in the family should notify  their providers of the family history of cancer. We recommend women in this family have a yearly mammogram beginning at age 38, or 54 years younger than the earliest onset of cancer, an annual clinical breast exam, and perform monthly breast self-exams.  Other members of the family may still carry a pathogenic variant in one of these genes that Robin Sellers did not inherit. Based on the family history, we recommend Robin Sellers, who was diagnosed with breast cancer at age 29, also stay in contact with Robin genetics providers to ensure she is up to date with genetic testing in the future.   FOLLOW-UP:  Lastly, we discussed with Robin Sellers that cancer genetics is a rapidly advancing field and it is possible that new genetic tests will be appropriate for Robin and/or Robin family members in the future. We encouraged Robin to remain in contact with cancer genetics on an annual basis so we can update Robin personal and family histories and let Robin know  of advances in cancer genetics that may benefit this family.   Our contact number was provided. Robin Sellers questions were answered to Robin satisfaction, and she knows she is welcome to call us at anytime with additional questions or concerns.   Robin Sellers M. Joette Catching, [place] Genetic Counselor [email or web address] (P) [phone]

## 2021-04-11 ENCOUNTER — Encounter (HOSPITAL_COMMUNITY): Payer: Self-pay | Admitting: Gynecologic Oncology

## 2021-04-12 ENCOUNTER — Inpatient Hospital Stay: Payer: Medicare Other

## 2021-04-12 ENCOUNTER — Inpatient Hospital Stay (HOSPITAL_BASED_OUTPATIENT_CLINIC_OR_DEPARTMENT_OTHER): Payer: Medicare Other | Admitting: Hematology and Oncology

## 2021-04-12 ENCOUNTER — Encounter: Payer: Self-pay | Admitting: Hematology and Oncology

## 2021-04-12 ENCOUNTER — Other Ambulatory Visit: Payer: Self-pay

## 2021-04-12 DIAGNOSIS — C482 Malignant neoplasm of peritoneum, unspecified: Secondary | ICD-10-CM

## 2021-04-12 DIAGNOSIS — D61818 Other pancytopenia: Secondary | ICD-10-CM

## 2021-04-12 LAB — CBC WITH DIFFERENTIAL (CANCER CENTER ONLY)
Abs Immature Granulocytes: 0.01 10*3/uL (ref 0.00–0.07)
Basophils Absolute: 0 10*3/uL (ref 0.0–0.1)
Basophils Relative: 2 %
Eosinophils Absolute: 0.1 10*3/uL (ref 0.0–0.5)
Eosinophils Relative: 4 %
HCT: 35.1 % — ABNORMAL LOW (ref 36.0–46.0)
Hemoglobin: 11.9 g/dL — ABNORMAL LOW (ref 12.0–15.0)
Immature Granulocytes: 0 %
Lymphocytes Relative: 49 %
Lymphs Abs: 1.2 10*3/uL (ref 0.7–4.0)
MCH: 30.7 pg (ref 26.0–34.0)
MCHC: 33.9 g/dL (ref 30.0–36.0)
MCV: 90.7 fL (ref 80.0–100.0)
Monocytes Absolute: 0.4 10*3/uL (ref 0.1–1.0)
Monocytes Relative: 15 %
Neutro Abs: 0.8 10*3/uL — ABNORMAL LOW (ref 1.7–7.7)
Neutrophils Relative %: 30 %
Platelet Count: 131 10*3/uL — ABNORMAL LOW (ref 150–400)
RBC: 3.87 MIL/uL (ref 3.87–5.11)
RDW: 12.6 % (ref 11.5–15.5)
WBC Count: 2.5 10*3/uL — ABNORMAL LOW (ref 4.0–10.5)
nRBC: 0 % (ref 0.0–0.2)

## 2021-04-12 LAB — CMP (CANCER CENTER ONLY)
ALT: 17 U/L (ref 0–44)
AST: 17 U/L (ref 15–41)
Albumin: 4.1 g/dL (ref 3.5–5.0)
Alkaline Phosphatase: 73 U/L (ref 38–126)
Anion gap: 6 (ref 5–15)
BUN: 20 mg/dL (ref 8–23)
CO2: 29 mmol/L (ref 22–32)
Calcium: 9 mg/dL (ref 8.9–10.3)
Chloride: 106 mmol/L (ref 98–111)
Creatinine: 0.79 mg/dL (ref 0.44–1.00)
GFR, Estimated: >60 mL/min (ref >60)
Glucose, Bld: 102 mg/dL — ABNORMAL HIGH (ref 70–99)
Potassium: 4.1 mmol/L (ref 3.5–5.1)
Sodium: 141 mmol/L (ref 135–145)
Total Bilirubin: 0.5 mg/dL (ref 0.3–1.2)
Total Protein: 6.5 g/dL (ref 6.5–8.1)

## 2021-04-12 NOTE — Assessment & Plan Note (Signed)
She has mild leukopenia which is not unexpected due to her blood drawn a few days early We will proceed with treatment without delay For her next treatment, she would like to delay 1 week due to planned family vacation and she would like to help to watch her grand child We will accommodate her needs and move her appointment I plan to repeat imaging study a month after her last dose of treatment

## 2021-04-12 NOTE — Assessment & Plan Note (Signed)
This is due to side effects of treatment We will proceed with treatment without delay or medication adjustment

## 2021-04-12 NOTE — Progress Notes (Signed)
[place] OFFICE PROGRESS NOTE  Patient Care Team: Lorene Dy, MD as PCP - General (Internal Medicine) Awanda Mink Craige Cotta, RN as Oncology Nurse Navigator (Oncology)  ASSESSMENT & PLAN:  Primary peritoneal carcinomatosis [facility]) She has mild leukopenia which is not unexpected due to her blood drawn a few days early We will proceed with treatment without delay For her next treatment, she would like to delay 1 week due to planned family vacation and she would like to help to watch her grand child We will accommodate her needs and move her appointment I plan to repeat imaging study a month after her last dose of treatment  Pancytopenia, acquired ([place]) This is due to side effects of treatment We will proceed with treatment without delay or medication adjustment  No orders of the defined types were placed in this encounter.   All questions were answered. The patient knows to call the clinic with any problems, questions or concerns. The total time spent in the appointment was 20 minutes encounter with patients including review of chart and various tests results, discussions about plan of care and coordination of care plan   [name], MD 04/12/2021 1:57 PM  INTERVAL HISTORY: Please see below for problem oriented charting. she returns for treatment follow-up seen a few days early prior to next cycle of treatment She is here accompanied by her another She tolerated last cycle of therapy better She has some changes in her appetite but she did not lose any weight No peripheral neuropathy, nausea or changes in bowel habits  REVIEW OF SYSTEMS:   Constitutional: Denies fevers, chills or abnormal weight loss Eyes: Denies blurriness of vision Ears, nose, mouth, throat, and face: Denies mucositis or sore throat Respiratory: Denies cough, dyspnea or wheezes Cardiovascular: Denies palpitation, chest discomfort or lower extremity swelling Gastrointestinal:  Denies nausea,  heartburn or change in bowel habits Skin: Denies abnormal skin rashes Lymphatics: Denies new lymphadenopathy or easy bruising Neurological:Denies numbness, tingling or new weaknesses Behavioral/Psych: Mood is stable, no new changes  All other systems were reviewed with the patient and are negative.  I have reviewed the past medical history, past surgical history, social history and family history with the patient and they are unchanged from previous note.  ALLERGIES:  is allergic to artichoke [cynara scolymus (artichoke)].  MEDICATIONS:  Current Outpatient Medications  Medication Sig Dispense Refill   Biotin 5000 MCG TABS Take 5,000 mcg by mouth daily. (Patient not taking: Reported on 03/24/2021)     Calcium Carbonate-Vitamin D 600-400 MG-UNIT tablet Take 1 tablet by mouth daily. (Patient not taking: Reported on 03/24/2021)     Cholecalciferol 50 MCG (2000 UT) CAPS Take 2,000 Units by mouth daily. (Patient not taking: Reported on 03/24/2021)     dexamethasone (DECADRON) 4 MG tablet Take 2 tabs at the night before and 2 tab the morning of chemotherapy, every 3 weeks, by mouth x 6 cycles (Patient not taking: Reported on 03/24/2021) 36 tablet 6   Multiple Vitamins-Minerals (MULTIVITAMIN WITH MINERALS) tablet Take 1 tablet by mouth daily. (Patient not taking: Reported on 03/24/2021)     ondansetron (ZOFRAN) 8 MG tablet Take 1 tablet (8 mg total) by mouth every 8 (eight) hours as needed for refractory nausea / vomiting. (Patient not taking: Reported on 03/24/2021) 30 tablet 1   Polyvinyl Alcohol-Povidone (CLEAR EYES NATURAL TEARS) 5-6 MG/ML SOLN Place 1 drop into both eyes daily as needed (Dry eyes). (Patient not taking: Reported on 03/24/2021)     prochlorperazine (COMPAZINE)  10 MG tablet Take 1 tablet (10 mg total) by mouth every 6 (six) hours as needed (Nausea or vomiting). (Patient not taking: Reported on 03/24/2021) 30 tablet 1   risedronate (ACTONEL) 150 MG tablet Take 150 mg by mouth every 30  (thirty) days. (Patient not taking: Reported on 03/24/2021)     senna-docusate (SENOKOT-S) 8.6-50 MG tablet Take 2 tablets by mouth at bedtime. For AFTER surgery, do not take if having diarrhea (Patient not taking: Reported on 03/24/2021) 30 tablet 0   traMADol (ULTRAM) 50 MG tablet Take 1 tablet (50 mg total) by mouth every 6 (six) hours as needed for severe pain. For AFTER surgery only, do not take and drive (Patient not taking: Reported on 03/24/2021) 10 tablet 0   tretinoin (RETIN-A) 0.025 % cream Apply 1 application topically at bedtime as needed (blemishes). (Patient not taking: Reported on 03/24/2021)     No current facility-administered medications for this visit.    SUMMARY OF ONCOLOGIC HISTORY: Oncology History Overview Note  Neg genetics   Primary peritoneal carcinomatosis ([place])  08/21/2020 Initial Diagnosis   The patient reports intermittent right upper quadrant pain that feel like she pulled a muscle beginning in March 2022.  In April, she woke up with sharp pain in her right upper quadrant.  This was her first episode of sharp pain.  She thought that her pain was related to her gallbladder and presented to the emergency department.  She was seen in ED on 4/30. RUQ ultrasound and x-ray were normal as were LFTs and lipase.    10/27/2020 Imaging   Findings highly suspicious for peritoneal carcinomatosis. No site of primary malignancy identified.   Colonic diverticulosis, without radiographic evidence of diverticulitis   11/02/2020 Initial Diagnosis   Primary peritoneal carcinomatosis ([place])   11/02/2020 Tumor Marker   Patient's tumor was tested for the following markers: CA-125. Results of the tumor marker test revealed 88.   11/11/2020 Pathology Results   FINAL MICROSCOPIC DIAGNOSIS:   A. CUL DE SAC, LEFT ANTERIOR, EXCISION:  -  Poorly differentiated carcinoma  -  See comment   B. SIDEWALL, RIGHT, BIOPSY:  -  Poorly differentiated carcinoma  -  See comment   COMMENT:   By  immunohistochemistry, the neoplastic cells are positive for cytokeratin 7, p53, PAX8 and WT1 but negative for cytokeratin 20, GATA3, CDX2 and TTF-1.  Overall, the immunophenotype is consistent with a  gynecologic primary.    11/11/2020 Pathology Results   FINAL MICROSCOPIC DIAGNOSIS:  - Malignant cells consistent with metastatic adenocarcinoma    11/11/2020 Surgery   Pre-operative Diagnosis: Carcinomatosis, mildly elevated CA-125   Post-operative Diagnosis: same, At least stage IIIC gyn malignancy   Operation: Diagnostic laparoscopy, peritoneal biopsies    Surgeon: [name] Operative Findings: On EUA, small mobile uterus. On intra-abdominal entry, carcinomatosis appreciated studding the right anterior diaphragm, bilateral liver surfaces, mesentery, pelvic peritoneum. Omental cake with measuring up to 2x3cm. Right ovary adherent to the right uterus with peritoneal studding adjacent. Frondular implants noted along right pelvic sidewall. Miliary disease along anterior cul de sac. Cul de sac covered with friable miliary disease. Small volume dark amber ascites. Specimens: left anterior cul de sac peritoneum, right pelvic sidewall peritoneal biopsy          11/15/2020 Cancer Staging   Staging form: Ovary, Fallopian Tube, and Primary Peritoneal Carcinoma, AJCC 8th Edition - Clinical stage from 11/15/2020: FIGO Stage III (cT3, cN0, cM0) - Signed by [name], MD on 11/15/2020 Stage prefix: Initial  diagnosis    11/26/2020 -  Chemotherapy   Patient is on Treatment Plan : OVARIAN Carboplatin (AUC 6) / Paclitaxel (175) q21d x 6 cycles     11/26/2020 Tumor Marker   Patient's tumor was tested for the following markers: CA-125. Results of the tumor marker test revealed 76.8.   12/17/2020 Tumor Marker   Patient's tumor was tested for the following markers: CA-125. Results of the tumor marker test revealed 58.1.   01/07/2021 Tumor Marker   Patient's tumor was tested for the following markers:  CA-125. Results of the tumor marker test revealed 21.6.   01/20/2021 Imaging   Decreased omental soft tissue nodularity and caking, consistent with interval improvement in carcinoma.   No evidence of new or progressive disease within the abdomen or pelvis.   Colonic diverticulosis. No radiographic evidence of diverticulitis.   Large stool burden noted; recommend clinical correlation for possible constipation   03/14/2021 Tumor Marker   Patient's tumor was tested for the following markers: CA-125. Results of the tumor marker test revealed 32.7.   03/23/2021 Imaging   Unremarkable right upper quadrant ultrasound   04/01/2021 Genetic Testing   HRD status through [place] [facility] was negative.  The report date is 04/01/2021. Negative hereditary cancer genetic testing: no pathogenic variants detected in [facility] My[facility].  The report date is 04/04/2021.  The [facility] gene panel offered by [employer] includes sequencing and deletion/duplication testing of the following 48 genes: APC, ATM, AXIN2, BAP1, BARD1, BMPR1A, BRCA1, BRCA2, BRIP1, CHD1, CDK4, CDKN2A(p16 and p14ARF), CHEK2, CTNNA1, EGFR, EPCAM, FH, FLCN, GREM1, HOXB13, MEN1, MET, MITF , MLH1, MSH2, MSH3, MSH6, MUTYH, NTHL1, PALB2, PMS2, POLD1, POLE, PTEN, RAD51C, RAD51D, RET, SDHA, SDHB, SDHC, SDHD, SMAD4, STK11,TERT, TP53, TSC1, TSC2, and VHL.     PHYSICAL EXAMINATION: ECOG PERFORMANCE STATUS: 1 - Symptomatic but completely ambulatory  Vitals:   04/12/21 0819  BP: (!) 135/55  Pulse: 74  Resp: 18  Temp: 98.1 F (36.7 C)  SpO2: 100%   Filed Weights   04/12/21 0819  Weight: 117 lb (53.1 kg)    GENERAL:alert, no distress and comfortable SKIN: skin color, texture, turgor are normal, no rashes or significant lesions EYES: normal, Conjunctiva are pink and non-injected, sclera clear OROPHARYNX:no exudate, no erythema and lips, buccal mucosa, and tongue normal  NECK: supple, thyroid normal size, non-tender, without  nodularity LYMPH:  no palpable lymphadenopathy in the cervical, axillary or inguinal LUNGS: clear to auscultation and percussion with normal breathing effort HEART: regular rate & rhythm and no murmurs and no lower extremity edema ABDOMEN:abdomen soft, non-tender and normal bowel sounds Musculoskeletal:no cyanosis of digits and no clubbing  NEURO: alert & oriented x 3 with fluent speech, no focal motor/sensory deficits  LABORATORY DATA:  I have reviewed the data as listed    Component Value Date/Time   NA 141 04/12/2021 0805   K 4.1 04/12/2021 0805   CL 106 04/12/2021 0805   CO2 29 04/12/2021 0805   GLUCOSE 102 (H) 04/12/2021 0805   BUN 20 04/12/2021 0805   CREATININE 0.79 04/12/2021 0805   CALCIUM 9.0 04/12/2021 0805   PROT 6.5 04/12/2021 0805   ALBUMIN 4.1 04/12/2021 0805   AST 17 04/12/2021 0805   ALT 17 04/12/2021 0805   ALKPHOS 73 04/12/2021 0805   BILITOT 0.5 04/12/2021 0805   GFRNONAA >60 04/12/2021 0805   GFRAA  02/27/2007 1510    >60        The eGFR has been calculated using the MDRD equation.  This calculation has not been validated in all clinical    No results found for: SPEP, UPEP  Lab Results  Component Value Date   WBC 2.5 (L) 04/12/2021   NEUTROABS 0.8 (L) 04/12/2021   HGB 11.9 (L) 04/12/2021   HCT 35.1 (L) 04/12/2021   MCV 90.7 04/12/2021   PLT 131 (L) 04/12/2021      Chemistry      Component Value Date/Time   NA 141 04/12/2021 0805   K 4.1 04/12/2021 0805   CL 106 04/12/2021 0805   CO2 29 04/12/2021 0805   BUN 20 04/12/2021 0805   CREATININE 0.79 04/12/2021 0805      Component Value Date/Time   CALCIUM 9.0 04/12/2021 0805   ALKPHOS 73 04/12/2021 0805   AST 17 04/12/2021 0805   ALT 17 04/12/2021 0805   BILITOT 0.5 04/12/2021 0805       RADIOGRAPHIC STUDIES: I have personally reviewed the radiological images as listed and agreed with the findings in the report. US Abdomen Limited RUQ (LIVER/GB)  Result Date: 03/24/2021 CLINICAL  DATA:  Elevated LFTs. EXAM: ULTRASOUND ABDOMEN LIMITED RIGHT UPPER QUADRANT COMPARISON:  Ultrasound dated 08/21/2020 and CT dated 01/19/2021. FINDINGS: Gallbladder: No gallstones or wall thickening visualized. No sonographic Murphy sign noted by sonographer. Common bile duct: Diameter: 4 mm Liver: No focal lesion identified. Within normal limits in parenchymal echogenicity. Portal vein is patent on color Doppler imaging with normal direction of blood flow towards the liver. Other: None. IMPRESSION: Unremarkable right upper quadrant ultrasound. Electronically Signed   By: Anner Crete M.D.   On: 03/24/2021 02:01

## 2021-04-13 LAB — CA 125: Cancer Antigen (CA) 125: 12.7 U/mL (ref 0.0–38.1)

## 2021-04-15 ENCOUNTER — Other Ambulatory Visit: Payer: Self-pay

## 2021-04-15 ENCOUNTER — Inpatient Hospital Stay: Payer: Medicare Other

## 2021-04-15 VITALS — BP 152/63 | HR 89 | Temp 97.8°F | Resp 18 | Wt 116.2 lb

## 2021-04-15 DIAGNOSIS — C482 Malignant neoplasm of peritoneum, unspecified: Secondary | ICD-10-CM | POA: Diagnosis not present

## 2021-04-15 MED ORDER — SODIUM CHLORIDE 0.9 % IV SOLN: Freq: Once | INTRAVENOUS | Status: AC

## 2021-04-15 MED ORDER — DIPHENHYDRAMINE HCL 50 MG/ML IJ SOLN
25.0000 mg | Freq: Once | INTRAMUSCULAR | Status: AC
  Administered 2021-04-15: 09:00:00 25 mg via INTRAVENOUS
  Filled 2021-04-15: qty 1

## 2021-04-15 MED ORDER — PALONOSETRON HCL INJECTION 0.25 MG/5ML
0.2500 mg | Freq: Once | INTRAVENOUS | Status: AC
  Administered 2021-04-15: 09:00:00 0.25 mg via INTRAVENOUS
  Filled 2021-04-15: qty 5

## 2021-04-15 MED ORDER — SODIUM CHLORIDE 0.9 % IV SOLN
406.2000 mg | Freq: Once | INTRAVENOUS | Status: AC
  Administered 2021-04-15: 14:00:00 410 mg via INTRAVENOUS
  Filled 2021-04-15: qty 41

## 2021-04-15 MED ORDER — SODIUM CHLORIDE 0.9 % IV SOLN
10.0000 mg | Freq: Once | INTRAVENOUS | Status: AC
  Administered 2021-04-15: 10:00:00 10 mg via INTRAVENOUS
  Filled 2021-04-15: qty 10

## 2021-04-15 MED ORDER — FAMOTIDINE 20 MG IN NS 100 ML IVPB
20.0000 mg | Freq: Once | INTRAVENOUS | Status: AC
  Administered 2021-04-15: 09:00:00 20 mg via INTRAVENOUS
  Filled 2021-04-15: qty 100

## 2021-04-15 MED ORDER — SODIUM CHLORIDE 0.9 % IV SOLN
175.0000 mg/m2 | Freq: Once | INTRAVENOUS | Status: AC
  Administered 2021-04-15: 11:00:00 264 mg via INTRAVENOUS
  Filled 2021-04-15: qty 44

## 2021-04-15 MED ORDER — SODIUM CHLORIDE 0.9 % IV SOLN
150.0000 mg | Freq: Once | INTRAVENOUS | Status: AC
  Administered 2021-04-15: 10:00:00 150 mg via INTRAVENOUS
  Filled 2021-04-15: qty 150

## 2021-04-15 NOTE — Patient Instructions (Signed)
[facility]COL[facility]  Discharge Instructions: Thank you for choosing [facility] to provide your oncology and hematology care.   If you have a lab appointment with the Cancer Center, please go directly to the Cancer Center and check in at the registration area.   Wear comfortable clothing and clothing appropriate for easy access to any Portacath or PICC line.   We strive to give you quality time with your provider. You may need to reschedule your appointment if you arrive late (15 or more minutes).  Arriving late affects you and other patients whose appointments are after yours.  Also, if you miss three or more appointments without notifying the office, you may be dismissed from the clinic at the provider's discretion.      For prescription refill requests, have your pharmacy contact our office and allow 72 hours for refills to be completed.    Today you received the following chemotherapy and/or immunotherapy agents Taxol and Carboplatin       To help prevent nausea and vomiting after your treatment, we encourage you to take your nausea medication as directed.  BELOW ARE SYMPTOMS THAT SHOULD BE REPORTED IMMEDIATELY: *FEVER GREATER THAN 100.4 F (38 C) OR HIGHER *CHILLS OR SWEATING *NAUSEA AND VOMITING THAT IS NOT CONTROLLED WITH YOUR NAUSEA MEDICATION *UNUSUAL SHORTNESS OF BREATH *UNUSUAL BRUISING OR BLEEDING *URINARY PROBLEMS (pain or burning when urinating, or frequent urination) *BOWEL PROBLEMS (unusual diarrhea, constipation, pain near the anus) TENDERNESS IN MOUTH AND THROAT WITH OR WITHOUT PRESENCE OF ULCERS (sore throat, sores in mouth, or a toothache) UNUSUAL RASH, SWELLING OR PAIN  UNUSUAL VAGINAL DISCHARGE OR ITCHING   Items with * indicate a potential emergency and should be followed up as soon as possible or go to the Emergency Department if any problems should occur.  Please show the CHEMOTHERAPY ALERT CARD or IMMUNOTHERAPY ALERT CARD at  check-in to the Emergency Department and triage nurse.  Should you have questions after your visit or need to cancel or reschedule your appointment, please contact [facility]  Dept: [phone]  and follow the prompts.  Office hours are 8:00 a.m. to 4:30 p.m. Monday - Friday. Please note that voicemails left after 4:00 p.m. may not be returned until the following business day.  We are closed weekends and major holidays. You have access to a nurse at all times for urgent questions. Please call the main number to the clinic Dept: [phone] and follow the prompts.   For any non-urgent questions, you may also contact your provider using MyChart. We now offer e-Visits for anyone 18 and older to request care online for non-urgent symptoms. For details visit [email or web address].   Also download the MyChart app! Go to the app store, search "MyChart", open the app, select [facility], and log in with your MyChart username and password.  Due to Covid, a mask is required upon entering the hospital/clinic. If you do not have a mask, one will be given to you upon arrival. For doctor visits, patients may have 1 support person aged 18 or older with them. For treatment visits, patients cannot have anyone with them due to current Covid guidelines and our immunocompromised population.   

## 2021-05-04 NOTE — Discharge Summary (Signed)
Physician Discharge Summary  Patient ID: Robin Sellers MRN: [id number] DOB/AGE: 1951-04-02 71 y.o.  Admit date: 02/24/2021 Discharge date: 05/04/2021  Admission Diagnoses: <principal problem not specified>  Discharge Diagnoses:  Active Problems:   * No active hospital problems. *   Discharged Condition:  The patient is in good condition and stable for discharge.  good  Hospital Course: The patient presented for IDS after NACT in the setting of advanced HGS carcinoma of gyn origin. On 11/3, she underwent TRH/BSO with tumor debulking including peritoneal stripping, and mini-lap for specimen delivery. She was meeting post-operative milestones on POD#0 and was discharged to home in stable condition.  Consults: None  Significant Diagnostic Studies: n/a  Treatments: surgery Operation: Robotic-assisted laparoscopic total hysterectomy with bilateral salpingoophorectomy, peritoneal stripping, mini-lap for omentectomy and intra-abdominal palpation Findings: On EUA, small mobile uterus. Rectum free. On intra-abdominal entry, normal upper abdominal survey including liver edge, diaphragm and stomach. Omentum with several small (<2cm) areas of thickening, suspected treated tumor versus residual tumor. Uterus 6cm and normal in appearance. Atrophic appearing bilateral adnexa. Some adhesions of the left ovary to the medial leaf of the broad ligament. Minimal peritoneal nodularity versus treated disease within posterior cul de sac and deep left pelvis, all either excised or ablated. No significant peritoneal disease or carcinomatosis. No sigmoid or rectal involvement. Small bowel normal in appearance. No adenopathy. No ascites. R0 resection.   Discharge Exam: Blood pressure 133/65, pulse 78, temperature 97.8 F (36.6 C), resp. rate 13, weight 115 lb 3.2 oz (52.3 kg), SpO2 100 %. See PE from H&P on day of surgery  Disposition: Discharge disposition: 01-Home or Self Care       Discharge  Instructions     Call MD for:  difficulty breathing, headache or visual disturbances   Complete by: As directed    Call MD for:  extreme fatigue   Complete by: As directed    Call MD for:  persistant dizziness or light-headedness   Complete by: As directed    Call MD for:  persistant nausea and vomiting   Complete by: As directed    Call MD for:  redness, tenderness, or signs of infection (pain, swelling, redness, odor or green/yellow discharge around incision site)   Complete by: As directed    Call MD for:  severe uncontrolled pain   Complete by: As directed    Call MD for:  temperature >100.4   Complete by: As directed    Diet - low sodium heart healthy   Complete by: As directed    No dressing needed   Complete by: As directed       Allergies as of 02/24/2021       Reactions   Artichoke Robin Sellers (artichoke)] Rash        Medication List     TAKE these medications    Biotin 5000 MCG Tabs Take 5,000 mcg by mouth daily.   Calcium Carbonate-Vitamin D 600-400 MG-UNIT tablet Take 1 tablet by mouth daily.   Cholecalciferol 50 MCG (2000 UT) Caps Take 2,000 Units by mouth daily.   Clear Eyes Natural Tears 5-6 MG/ML Soln Generic drug: Polyvinyl Alcohol-Povidone Place 1 drop into both eyes daily as needed (Dry eyes).   dexamethasone 4 MG tablet Commonly known as: DECADRON Take 2 tabs at the night before and 2 tab the morning of chemotherapy, every 3 weeks, by mouth x 6 cycles   multivitamin with minerals tablet Take 1 tablet by mouth daily.   ondansetron 8  MG tablet Commonly known as: Zofran Take 1 tablet (8 mg total) by mouth every 8 (eight) hours as needed for refractory nausea / vomiting.   prochlorperazine 10 MG tablet Commonly known as: COMPAZINE Take 1 tablet (10 mg total) by mouth every 6 (six) hours as needed (Nausea or vomiting).   risedronate 150 MG tablet Commonly known as: ACTONEL Take 150 mg by mouth every 30 (thirty) days.    senna-docusate 8.6-50 MG tablet Commonly known as: Senokot-S Take 2 tablets by mouth at bedtime. For AFTER surgery, do not take if having diarrhea   traMADol 50 MG tablet Commonly known as: ULTRAM Take 1 tablet (50 mg total) by mouth every 6 (six) hours as needed for severe pain. For AFTER surgery only, do not take and drive   tretinoin 6.754 % cream Commonly known as: RETIN-A Apply 1 application topically at bedtime as needed (blemishes).               Discharge Care Instructions  (From admission, onward)           Start     Ordered   02/24/21 0000  No dressing needed        02/24/21 1044             Greater than thirty minutes were spend for face to face discharge instructions and discharge orders/summary in EPIC.   Signed: [name] 05/04/2021, 3:43 PM

## 2021-05-06 ENCOUNTER — Ambulatory Visit: Payer: Medicare Other | Admitting: Hematology and Oncology

## 2021-05-06 ENCOUNTER — Other Ambulatory Visit: Payer: Medicare Other

## 2021-05-06 ENCOUNTER — Ambulatory Visit: Payer: Medicare Other

## 2021-05-12 MED FILL — Dexamethasone Sodium Phosphate Inj 100 MG/10ML: INTRAMUSCULAR | Qty: 1 | Status: AC

## 2021-05-12 MED FILL — Fosaprepitant Dimeglumine For IV Infusion 150 MG (Base Eq): INTRAVENOUS | Qty: 5 | Status: AC

## 2021-05-13 ENCOUNTER — Inpatient Hospital Stay: Payer: Medicare Other | Attending: Gynecologic Oncology

## 2021-05-13 ENCOUNTER — Inpatient Hospital Stay (HOSPITAL_BASED_OUTPATIENT_CLINIC_OR_DEPARTMENT_OTHER): Payer: Medicare Other | Admitting: Hematology and Oncology

## 2021-05-13 ENCOUNTER — Other Ambulatory Visit: Payer: Self-pay

## 2021-05-13 ENCOUNTER — Inpatient Hospital Stay: Payer: Medicare Other

## 2021-05-13 VITALS — BP 133/65 | HR 84 | Temp 97.6°F | Resp 18 | Ht 64.0 in | Wt 114.2 lb

## 2021-05-13 DIAGNOSIS — C482 Malignant neoplasm of peritoneum, unspecified: Secondary | ICD-10-CM

## 2021-05-13 DIAGNOSIS — Z5111 Encounter for antineoplastic chemotherapy: Secondary | ICD-10-CM | POA: Insufficient documentation

## 2021-05-13 DIAGNOSIS — R971 Elevated cancer antigen 125 [CA 125]: Secondary | ICD-10-CM | POA: Diagnosis not present

## 2021-05-13 DIAGNOSIS — Z79899 Other long term (current) drug therapy: Secondary | ICD-10-CM | POA: Insufficient documentation

## 2021-05-13 LAB — CMP (CANCER CENTER ONLY)
ALT: 16 U/L (ref 0–44)
AST: 19 U/L (ref 15–41)
Albumin: 4.7 g/dL (ref 3.5–5.0)
Alkaline Phosphatase: 47 U/L (ref 38–126)
Anion gap: 10 (ref 5–15)
BUN: 26 mg/dL — ABNORMAL HIGH (ref 8–23)
CO2: 25 mmol/L (ref 22–32)
Calcium: 9.7 mg/dL (ref 8.9–10.3)
Chloride: 103 mmol/L (ref 98–111)
Creatinine: 0.76 mg/dL (ref 0.44–1.00)
GFR, Estimated: >60 mL/min (ref >60)
Glucose, Bld: 154 mg/dL — ABNORMAL HIGH (ref 70–99)
Potassium: 4 mmol/L (ref 3.5–5.1)
Sodium: 138 mmol/L (ref 135–145)
Total Bilirubin: 0.6 mg/dL (ref 0.3–1.2)
Total Protein: 7.4 g/dL (ref 6.5–8.1)

## 2021-05-13 LAB — CBC WITH DIFFERENTIAL (CANCER CENTER ONLY)
Abs Immature Granulocytes: 0.02 10*3/uL (ref 0.00–0.07)
Basophils Absolute: 0 10*3/uL (ref 0.0–0.1)
Basophils Relative: 0 %
Eosinophils Absolute: 0 10*3/uL (ref 0.0–0.5)
Eosinophils Relative: 0 %
HCT: 39.4 % (ref 36.0–46.0)
Hemoglobin: 13.3 g/dL (ref 12.0–15.0)
Immature Granulocytes: 0 %
Lymphocytes Relative: 6 %
Lymphs Abs: 0.6 10*3/uL — ABNORMAL LOW (ref 0.7–4.0)
MCH: 29.8 pg (ref 26.0–34.0)
MCHC: 33.8 g/dL (ref 30.0–36.0)
MCV: 88.3 fL (ref 80.0–100.0)
Monocytes Absolute: 0 10*3/uL — ABNORMAL LOW (ref 0.1–1.0)
Monocytes Relative: 0 %
Neutro Abs: 8.6 10*3/uL — ABNORMAL HIGH (ref 1.7–7.7)
Neutrophils Relative %: 94 %
Platelet Count: 160 10*3/uL (ref 150–400)
RBC: 4.46 MIL/uL (ref 3.87–5.11)
RDW: 12.4 % (ref 11.5–15.5)
WBC Count: 9.2 10*3/uL (ref 4.0–10.5)
nRBC: 0 % (ref 0.0–0.2)

## 2021-05-13 MED ORDER — SODIUM CHLORIDE 0.9 % IV SOLN
406.2000 mg | Freq: Once | INTRAVENOUS | Status: AC
  Administered 2021-05-13: 410 mg via INTRAVENOUS
  Filled 2021-05-13: qty 41

## 2021-05-13 MED ORDER — SODIUM CHLORIDE 0.9 % IV SOLN
175.0000 mg/m2 | Freq: Once | INTRAVENOUS | Status: AC
  Administered 2021-05-13: 264 mg via INTRAVENOUS
  Filled 2021-05-13: qty 44

## 2021-05-13 MED ORDER — PALONOSETRON HCL INJECTION 0.25 MG/5ML
0.2500 mg | Freq: Once | INTRAVENOUS | Status: AC
  Administered 2021-05-13: 0.25 mg via INTRAVENOUS
  Filled 2021-05-13: qty 5

## 2021-05-13 MED ORDER — SODIUM CHLORIDE 0.9 % IV SOLN: Freq: Once | INTRAVENOUS | Status: AC

## 2021-05-13 MED ORDER — SODIUM CHLORIDE 0.9 % IV SOLN
150.0000 mg | Freq: Once | INTRAVENOUS | Status: AC
  Administered 2021-05-13: 150 mg via INTRAVENOUS
  Filled 2021-05-13: qty 150

## 2021-05-13 MED ORDER — FAMOTIDINE 20 MG IN NS 100 ML IVPB
20.0000 mg | Freq: Once | INTRAVENOUS | Status: AC
  Administered 2021-05-13: 20 mg via INTRAVENOUS
  Filled 2021-05-13: qty 100

## 2021-05-13 MED ORDER — DIPHENHYDRAMINE HCL 50 MG/ML IJ SOLN
25.0000 mg | Freq: Once | INTRAMUSCULAR | Status: AC
  Administered 2021-05-13: 25 mg via INTRAVENOUS
  Filled 2021-05-13: qty 1

## 2021-05-13 MED ORDER — SODIUM CHLORIDE 0.9 % IV SOLN
10.0000 mg | Freq: Once | INTRAVENOUS | Status: AC
  Administered 2021-05-13: 10 mg via INTRAVENOUS
  Filled 2021-05-13: qty 10

## 2021-05-13 NOTE — Patient Instructions (Signed)
[facility]   Discharge Instructions: Thank you for choosing [facility] to provide your oncology and hematology care.   If you have a lab appointment with the Cancer Center, please go directly to the Cancer Center and check in at the registration area.   Wear comfortable clothing and clothing appropriate for easy access to any Portacath or PICC line.   We strive to give you quality time with your provider. You may need to reschedule your appointment if you arrive late (15 or more minutes).  Arriving late affects you and other patients whose appointments are after yours.  Also, if you miss three or more appointments without notifying the office, you may be dismissed from the clinic at the provider's discretion.      For prescription refill requests, have your pharmacy contact our office and allow 72 hours for refills to be completed.    Today you received the following chemotherapy and/or immunotherapy agents: paclitaxel and carboplatin.      To help prevent nausea and vomiting after your treatment, we encourage you to take your nausea medication as directed.  BELOW ARE SYMPTOMS THAT SHOULD BE REPORTED IMMEDIATELY: *FEVER GREATER THAN 100.4 F (38 C) OR HIGHER *CHILLS OR SWEATING *NAUSEA AND VOMITING THAT IS NOT CONTROLLED WITH YOUR NAUSEA MEDICATION *UNUSUAL SHORTNESS OF BREATH *UNUSUAL BRUISING OR BLEEDING *URINARY PROBLEMS (pain or burning when urinating, or frequent urination) *BOWEL PROBLEMS (unusual diarrhea, constipation, pain near the anus) TENDERNESS IN MOUTH AND THROAT WITH OR WITHOUT PRESENCE OF ULCERS (sore throat, sores in mouth, or a toothache) UNUSUAL RASH, SWELLING OR PAIN  UNUSUAL VAGINAL DISCHARGE OR ITCHING   Items with * indicate a potential emergency and should be followed up as soon as possible or go to the Emergency Department if any problems should occur.  Please show the CHEMOTHERAPY ALERT CARD or IMMUNOTHERAPY ALERT  CARD at check-in to the Emergency Department and triage nurse.  Should you have questions after your visit or need to cancel or reschedule your appointment, please contact [facility]  Dept: [phone]  and follow the prompts.  Office hours are 8:00 a.m. to 4:30 p.m. Monday - Friday. Please note that voicemails left after 4:00 p.m. may not be returned until the following business day.  We are closed weekends and major holidays. You have access to a nurse at all times for urgent questions. Please call the main number to the clinic Dept: [phone] and follow the prompts.   For any non-urgent questions, you may also contact your provider using MyChart. We now offer e-Visits for anyone 18 and older to request care online for non-urgent symptoms. For details visit [email or web address].   Also download the MyChart app! Go to the app store, search "MyChart", open the app, select [facility], and log in with your MyChart username and password.  Due to Covid, a mask is required upon entering the hospital/clinic. If you do not have a mask, one will be given to you upon arrival. For doctor visits, patients may have 1 support person aged 18 or older with them. For treatment visits, patients cannot have anyone with them due to current Covid guidelines and our immunocompromised population.   

## 2021-05-14 LAB — CA 125: Cancer Antigen (CA) 125: 11.6 U/mL (ref 0.0–38.1)

## 2021-05-15 ENCOUNTER — Encounter: Payer: Self-pay | Admitting: Hematology and Oncology

## 2021-05-15 NOTE — Progress Notes (Signed)
[facility] OFFICE PROGRESS NOTE  Patient Care Team: Lorene Dy, MD as PCP - General (Internal Medicine) Awanda Mink Craige Cotta, RN as Oncology Nurse Navigator (Oncology)  ASSESSMENT & PLAN:  Primary peritoneal carcinomatosis [facility]) She tolerated last cycle of treatment well We will proceed with treatment without delay I plan to order repeat imaging in 1 month  Orders Placed This Encounter  Procedures   CT ABDOMEN PELVIS W CONTRAST    Standing Status:   Future    Standing Expiration Date:   05/13/2022    Order Specific Question:   If indicated for the ordered procedure, I authorize the administration of contrast media per Radiology protocol    Answer:   Yes    Order Specific Question:   Preferred imaging location?    Answer:   [facility]    Order Specific Question:   Radiology Contrast Protocol - do NOT remove file path    Answer:   [email or web address]    All questions were answered. The patient knows to call the clinic with any problems, questions or concerns. The total time spent in the appointment was 20 minutes encounter with patients including review of chart and various tests results, discussions about plan of care and coordination of care plan   [name], MD 05/15/2021 1:36 PM  INTERVAL HISTORY: Please see below for problem oriented charting. she returns for treatment follow-up seen prior to final treatment No recent neuropathy  REVIEW OF SYSTEMS:   Constitutional: Denies fevers, chills or abnormal weight loss Eyes: Denies blurriness of vision Ears, nose, mouth, throat, and face: Denies mucositis or sore throat Respiratory: Denies cough, dyspnea or wheezes Cardiovascular: Denies palpitation, chest discomfort or lower extremity swelling Gastrointestinal:  Denies nausea, heartburn or change in bowel habits Skin: Denies abnormal skin rashes Lymphatics: Denies new lymphadenopathy or easy  bruising Neurological:Denies numbness, tingling or new weaknesses Behavioral/Psych: Mood is stable, no new changes  All other systems were reviewed with the patient and are negative.  I have reviewed the past medical history, past surgical history, social history and family history with the patient and they are unchanged from previous note.  ALLERGIES:  is allergic to artichoke [cynara scolymus (artichoke)].  MEDICATIONS:  Current Outpatient Medications  Medication Sig Dispense Refill   Biotin 5000 MCG TABS Take 5,000 mcg by mouth daily. (Patient not taking: Reported on 03/24/2021)     Calcium Carbonate-Vitamin D 600-400 MG-UNIT tablet Take 1 tablet by mouth daily. (Patient not taking: Reported on 03/24/2021)     Cholecalciferol 50 MCG (2000 UT) CAPS Take 2,000 Units by mouth daily. (Patient not taking: Reported on 03/24/2021)     dexamethasone (DECADRON) 4 MG tablet Take 2 tabs at the night before and 2 tab the morning of chemotherapy, every 3 weeks, by mouth x 6 cycles (Patient not taking: Reported on 03/24/2021) 36 tablet 6   Multiple Vitamins-Minerals (MULTIVITAMIN WITH MINERALS) tablet Take 1 tablet by mouth daily. (Patient not taking: Reported on 03/24/2021)     ondansetron (ZOFRAN) 8 MG tablet Take 1 tablet (8 mg total) by mouth every 8 (eight) hours as needed for refractory nausea / vomiting. (Patient not taking: Reported on 03/24/2021) 30 tablet 1   Polyvinyl Alcohol-Povidone (CLEAR EYES NATURAL TEARS) 5-6 MG/ML SOLN Place 1 drop into both eyes daily as needed (Dry eyes). (Patient not taking: Reported on 03/24/2021)     prochlorperazine (COMPAZINE) 10 MG tablet Take 1 tablet (10 mg total) by mouth every 6 (six) hours as needed (Nausea or  vomiting). (Patient not taking: Reported on 03/24/2021) 30 tablet 1   risedronate (ACTONEL) 150 MG tablet Take 150 mg by mouth every 30 (thirty) days. (Patient not taking: Reported on 03/24/2021)     senna-docusate (SENOKOT-S) 8.6-50 MG tablet Take 2 tablets by  mouth at bedtime. For AFTER surgery, do not take if having diarrhea (Patient not taking: Reported on 03/24/2021) 30 tablet 0   traMADol (ULTRAM) 50 MG tablet Take 1 tablet (50 mg total) by mouth every 6 (six) hours as needed for severe pain. For AFTER surgery only, do not take and drive (Patient not taking: Reported on 03/24/2021) 10 tablet 0   tretinoin (RETIN-A) 0.025 % cream Apply 1 application topically at bedtime as needed (blemishes). (Patient not taking: Reported on 03/24/2021)     No current facility-administered medications for this visit.    SUMMARY OF ONCOLOGIC HISTORY: Oncology History Overview Note  Neg genetics   Primary peritoneal carcinomatosis ([place])  08/21/2020 Initial Diagnosis   The patient reports intermittent right upper quadrant pain that feel like she pulled a muscle beginning in March 2022.  In April, she woke up with sharp pain in her right upper quadrant.  This was her first episode of sharp pain.  She thought that her pain was related to her gallbladder and presented to the emergency department.  She was seen in ED on 4/30. RUQ ultrasound and x-ray were normal as were LFTs and lipase.    10/27/2020 Imaging   Findings highly suspicious for peritoneal carcinomatosis. No site of primary malignancy identified.   Colonic diverticulosis, without radiographic evidence of diverticulitis   11/02/2020 Initial Diagnosis   Primary peritoneal carcinomatosis ([place])   11/02/2020 Tumor Marker   Patient's tumor was tested for the following markers: CA-125. Results of the tumor marker test revealed 88.   11/11/2020 Pathology Results   FINAL MICROSCOPIC DIAGNOSIS:   A. CUL DE SAC, LEFT ANTERIOR, EXCISION:  -  Poorly differentiated carcinoma  -  See comment   B. SIDEWALL, RIGHT, BIOPSY:  -  Poorly differentiated carcinoma  -  See comment   COMMENT:   By immunohistochemistry, the neoplastic cells are positive for cytokeratin 7, p53, PAX8 and WT1 but negative for cytokeratin 20,  GATA3, CDX2 and TTF-1.  Overall, the immunophenotype is consistent with a  gynecologic primary.    11/11/2020 Pathology Results   FINAL MICROSCOPIC DIAGNOSIS:  - Malignant cells consistent with metastatic adenocarcinoma    11/11/2020 Surgery   Pre-operative Diagnosis: Carcinomatosis, mildly elevated CA-125   Post-operative Diagnosis: same, At least stage IIIC gyn malignancy   Operation: Diagnostic laparoscopy, peritoneal biopsies    Surgeon: [name] Operative Findings: On EUA, small mobile uterus. On intra-abdominal entry, carcinomatosis appreciated studding the right anterior diaphragm, bilateral liver surfaces, mesentery, pelvic peritoneum. Omental cake with measuring up to 2x3cm. Right ovary adherent to the right uterus with peritoneal studding adjacent. Frondular implants noted along right pelvic sidewall. Miliary disease along anterior cul de sac. Cul de sac covered with friable miliary disease. Small volume dark amber ascites. Specimens: left anterior cul de sac peritoneum, right pelvic sidewall peritoneal biopsy          11/15/2020 Cancer Staging   Staging form: Ovary, Fallopian Tube, and Primary Peritoneal Carcinoma, AJCC 8th Edition - Clinical stage from 11/15/2020: FIGO Stage III (cT3, cN0, cM0) - Signed by [name], MD on 11/15/2020 Stage prefix: Initial diagnosis    11/26/2020 -  Chemotherapy   Patient is on Treatment Plan : OVARIAN Carboplatin (AUC  6) / Paclitaxel (175) q21d x 6 cycles     11/26/2020 Tumor Marker   Patient's tumor was tested for the following markers: CA-125. Results of the tumor marker test revealed 76.8.   12/17/2020 Tumor Marker   Patient's tumor was tested for the following markers: CA-125. Results of the tumor marker test revealed 58.1.   01/07/2021 Tumor Marker   Patient's tumor was tested for the following markers: CA-125. Results of the tumor marker test revealed 21.6.   01/20/2021 Imaging   Decreased omental soft tissue nodularity and  caking, consistent with interval improvement in carcinoma.   No evidence of new or progressive disease within the abdomen or pelvis.   Colonic diverticulosis. No radiographic evidence of diverticulitis.   Large stool burden noted; recommend clinical correlation for possible constipation   03/14/2021 Tumor Marker   Patient's tumor was tested for the following markers: CA-125. Results of the tumor marker test revealed 32.7.   03/23/2021 Imaging   Unremarkable right upper quadrant ultrasound   04/01/2021 Genetic Testing   HRD status through [facility] was negative.  The report date is 04/01/2021. Negative hereditary cancer genetic testing: no pathogenic variants detected in [facility].  The report date is 04/04/2021.  The [facility] gene panel offered by [employer] includes sequencing and deletion/duplication testing of the following 48 genes: APC, ATM, AXIN2, BAP1, BARD1, BMPR1A, BRCA1, BRCA2, BRIP1, CHD1, CDK4, CDKN2A(p16 and p14ARF), CHEK2, CTNNA1, EGFR, EPCAM, FH, FLCN, GREM1, HOXB13, MEN1, MET, MITF , MLH1, MSH2, MSH3, MSH6, MUTYH, NTHL1, PALB2, PMS2, POLD1, POLE, PTEN, RAD51C, RAD51D, RET, SDHA, SDHB, SDHC, SDHD, SMAD4, STK11,TERT, TP53, TSC1, TSC2, and VHL.   04/12/2021 Tumor Marker   Patient's tumor was tested for the following markers: CA-125. Results of the tumor marker test revealed 12.7.     PHYSICAL EXAMINATION: ECOG PERFORMANCE STATUS: 0 - Asymptomatic  Vitals:   05/13/21 0959  BP: 133/65  Pulse: 84  Resp: 18  Temp: 97.6 F (36.4 C)  SpO2: 99%   Filed Weights   05/13/21 0959  Weight: 114 lb 3.2 oz (51.8 kg)    GENERAL:alert, no distress and comfortable SKIN: skin color, texture, turgor are normal, no rashes or significant lesions EYES: normal, Conjunctiva are pink and non-injected, sclera clear OROPHARYNX:no exudate, no erythema and lips, buccal mucosa, and tongue normal  NECK: supple, thyroid normal size, non-tender, without  nodularity LYMPH:  no palpable lymphadenopathy in the cervical, axillary or inguinal LUNGS: clear to auscultation and percussion with normal breathing effort HEART: regular rate & rhythm and no murmurs and no lower extremity edema ABDOMEN:abdomen soft, non-tender and normal bowel sounds Musculoskeletal:no cyanosis of digits and no clubbing  NEURO: alert & oriented x 3 with fluent speech, no focal motor/sensory deficits  LABORATORY DATA:  I have reviewed the data as listed    Component Value Date/Time   NA 138 05/13/2021 0936   K 4.0 05/13/2021 0936   CL 103 05/13/2021 0936   CO2 25 05/13/2021 0936   GLUCOSE 154 (H) 05/13/2021 0936   BUN 26 (H) 05/13/2021 0936   CREATININE 0.76 05/13/2021 0936   CALCIUM 9.7 05/13/2021 0936   PROT 7.4 05/13/2021 0936   ALBUMIN 4.7 05/13/2021 0936   AST 19 05/13/2021 0936   ALT 16 05/13/2021 0936   ALKPHOS 47 05/13/2021 0936   BILITOT 0.6 05/13/2021 0936   GFRNONAA >60 05/13/2021 0936   GFRAA  02/27/2007 1510    >60        The eGFR has been calculated  using the MDRD equation. This calculation has not been validated in all clinical    No results found for: SPEP, UPEP  Lab Results  Component Value Date   WBC 9.2 05/13/2021   NEUTROABS 8.6 (H) 05/13/2021   HGB 13.3 05/13/2021   HCT 39.4 05/13/2021   MCV 88.3 05/13/2021   PLT 160 05/13/2021      Chemistry      Component Value Date/Time   NA 138 05/13/2021 0936   K 4.0 05/13/2021 0936   CL 103 05/13/2021 0936   CO2 25 05/13/2021 0936   BUN 26 (H) 05/13/2021 0936   CREATININE 0.76 05/13/2021 0936      Component Value Date/Time   CALCIUM 9.7 05/13/2021 0936   ALKPHOS 47 05/13/2021 0936   AST 19 05/13/2021 0936   ALT 16 05/13/2021 0936   BILITOT 0.6 05/13/2021 0936

## 2021-05-15 NOTE — Assessment & Plan Note (Signed)
She tolerated last cycle of treatment well We will proceed with treatment without delay I plan to order repeat imaging in 1 month

## 2021-06-10 ENCOUNTER — Other Ambulatory Visit: Payer: Self-pay

## 2021-06-10 ENCOUNTER — Ambulatory Visit (HOSPITAL_COMMUNITY)
Admission: RE | Admit: 2021-06-10 | Discharge: 2021-06-10 | Disposition: A | Discharge status: Home / Self Care | Payer: Medicare Other | Source: Ambulatory Visit | Attending: Hematology and Oncology | Admitting: Hematology and Oncology

## 2021-06-10 ENCOUNTER — Inpatient Hospital Stay: Payer: Medicare Other | Attending: Gynecologic Oncology

## 2021-06-10 ENCOUNTER — Encounter (HOSPITAL_COMMUNITY): Payer: Self-pay

## 2021-06-10 DIAGNOSIS — Z9221 Personal history of antineoplastic chemotherapy: Secondary | ICD-10-CM | POA: Insufficient documentation

## 2021-06-10 DIAGNOSIS — D61818 Other pancytopenia: Secondary | ICD-10-CM | POA: Insufficient documentation

## 2021-06-10 DIAGNOSIS — C482 Malignant neoplasm of peritoneum, unspecified: Secondary | ICD-10-CM | POA: Insufficient documentation

## 2021-06-10 DIAGNOSIS — D696 Thrombocytopenia, unspecified: Secondary | ICD-10-CM | POA: Insufficient documentation

## 2021-06-10 LAB — CBC WITH DIFFERENTIAL (CANCER CENTER ONLY)
Abs Immature Granulocytes: 0.02 10*3/uL (ref 0.00–0.07)
Basophils Absolute: 0 10*3/uL (ref 0.0–0.1)
Basophils Relative: 1 %
Eosinophils Absolute: 0.1 10*3/uL (ref 0.0–0.5)
Eosinophils Relative: 2 %
HCT: 38.4 % (ref 36.0–46.0)
Hemoglobin: 13 g/dL (ref 12.0–15.0)
Immature Granulocytes: 0 %
Lymphocytes Relative: 26 %
Lymphs Abs: 1.5 10*3/uL (ref 0.7–4.0)
MCH: 30.3 pg (ref 26.0–34.0)
MCHC: 33.9 g/dL (ref 30.0–36.0)
MCV: 89.5 fL (ref 80.0–100.0)
Monocytes Absolute: 0.4 10*3/uL (ref 0.1–1.0)
Monocytes Relative: 7 %
Neutro Abs: 3.6 10*3/uL (ref 1.7–7.7)
Neutrophils Relative %: 64 %
Platelet Count: 143 10*3/uL — ABNORMAL LOW (ref 150–400)
RBC: 4.29 MIL/uL (ref 3.87–5.11)
RDW: 12.7 % (ref 11.5–15.5)
WBC Count: 5.6 10*3/uL (ref 4.0–10.5)
nRBC: 0 % (ref 0.0–0.2)

## 2021-06-10 LAB — CMP (CANCER CENTER ONLY)
ALT: 21 U/L (ref 0–44)
AST: 21 U/L (ref 15–41)
Albumin: 4.5 g/dL (ref 3.5–5.0)
Alkaline Phosphatase: 42 U/L (ref 38–126)
Anion gap: 5 (ref 5–15)
BUN: 20 mg/dL (ref 8–23)
CO2: 32 mmol/L (ref 22–32)
Calcium: 9.2 mg/dL (ref 8.9–10.3)
Chloride: 104 mmol/L (ref 98–111)
Creatinine: 0.75 mg/dL (ref 0.44–1.00)
GFR, Estimated: >60 mL/min (ref >60)
Glucose, Bld: 100 mg/dL — ABNORMAL HIGH (ref 70–99)
Potassium: 3.8 mmol/L (ref 3.5–5.1)
Sodium: 141 mmol/L (ref 135–145)
Total Bilirubin: 0.4 mg/dL (ref 0.3–1.2)
Total Protein: 6.8 g/dL (ref 6.5–8.1)

## 2021-06-10 MED ORDER — IOHEXOL 300 MG/ML  SOLN
100.0000 mL | Freq: Once | INTRAMUSCULAR | Status: AC | PRN
  Administered 2021-06-10: 100 mL via INTRAVENOUS

## 2021-06-10 MED ORDER — SODIUM CHLORIDE (PF) 0.9 % IJ SOLN
INTRAMUSCULAR | Status: AC
  Filled 2021-06-10: qty 50

## 2021-06-11 LAB — CA 125: Cancer Antigen (CA) 125: 10.1 U/mL (ref 0.0–38.1)

## 2021-06-13 ENCOUNTER — Other Ambulatory Visit: Payer: Self-pay

## 2021-06-13 ENCOUNTER — Encounter: Payer: Self-pay | Admitting: Hematology and Oncology

## 2021-06-13 ENCOUNTER — Inpatient Hospital Stay (HOSPITAL_BASED_OUTPATIENT_CLINIC_OR_DEPARTMENT_OTHER): Payer: Medicare Other | Admitting: Hematology and Oncology

## 2021-06-13 DIAGNOSIS — C482 Malignant neoplasm of peritoneum, unspecified: Secondary | ICD-10-CM | POA: Diagnosis present

## 2021-06-13 DIAGNOSIS — D61818 Other pancytopenia: Secondary | ICD-10-CM

## 2021-06-13 DIAGNOSIS — D696 Thrombocytopenia, unspecified: Secondary | ICD-10-CM | POA: Diagnosis not present

## 2021-06-13 DIAGNOSIS — Z9221 Personal history of antineoplastic chemotherapy: Secondary | ICD-10-CM | POA: Diagnosis not present

## 2021-06-13 NOTE — Progress Notes (Signed)
[place] OFFICE PROGRESS NOTE  Patient Care Team: Lorene Dy, MD as PCP - General (Internal Medicine) Awanda Mink Craige Cotta, RN as Oncology Nurse Navigator (Oncology)  ASSESSMENT & PLAN:  Primary peritoneal carcinomatosis [facility]) I have reviewed blood work and imaging studies with the patient We discussed the limitation of tumor marker monitoring She has complete response to treatment She have minimal to no residual side effects from chemotherapy We reviewed the current guidelines The patient has no detectable genetic mutations I do not recommend her to proceed with any further maintenance treatment with PARP inhibitor She will return in 3 months to see GYN oncologist for follow-up I will see her in 6 months The patient is educated to watch her for signs and symptoms of cancer recurrence  Pancytopenia, acquired ([place]) She has minimum trace thrombocytopenia from recent treatment Observe only  No orders of the defined types were placed in this encounter.   All questions were answered. The patient knows to call the clinic with any problems, questions or concerns. The total time spent in the appointment was 30 minutes encounter with patients including review of chart and various tests results, discussions about plan of care and coordination of care plan   [name], MD 06/13/2021 9:31 AM  INTERVAL HISTORY: Please see below for problem oriented charting. she returns for treatment follow-up with her significant other She is doing well She tolerated last cycle of treatment very well  REVIEW OF SYSTEMS:   Constitutional: Denies fevers, chills or abnormal weight loss Eyes: Denies blurriness of vision Ears, nose, mouth, throat, and face: Denies mucositis or sore throat Respiratory: Denies cough, dyspnea or wheezes Cardiovascular: Denies palpitation, chest discomfort or lower extremity swelling Gastrointestinal:  Denies nausea, heartburn or change in bowel habits Skin:  Denies abnormal skin rashes Lymphatics: Denies new lymphadenopathy or easy bruising Neurological:Denies numbness, tingling or new weaknesses Behavioral/Psych: Mood is stable, no new changes  All other systems were reviewed with the patient and are negative.  I have reviewed the past medical history, past surgical history, social history and family history with the patient and they are unchanged from previous note.  ALLERGIES:  is allergic to artichoke [cynara scolymus (artichoke)].  MEDICATIONS:  Current Outpatient Medications  Medication Sig Dispense Refill   Biotin 5000 MCG TABS Take 5,000 mcg by mouth daily. (Patient not taking: Reported on 03/24/2021)     Calcium Carbonate-Vitamin D 600-400 MG-UNIT tablet Take 1 tablet by mouth daily. (Patient not taking: Reported on 03/24/2021)     Cholecalciferol 50 MCG (2000 UT) CAPS Take 2,000 Units by mouth daily. (Patient not taking: Reported on 03/24/2021)     Multiple Vitamins-Minerals (MULTIVITAMIN WITH MINERALS) tablet Take 1 tablet by mouth daily. (Patient not taking: Reported on 03/24/2021)     Polyvinyl Alcohol-Povidone (CLEAR EYES NATURAL TEARS) 5-6 MG/ML SOLN Place 1 drop into both eyes daily as needed (Dry eyes). (Patient not taking: Reported on 03/24/2021)     risedronate (ACTONEL) 150 MG tablet Take 150 mg by mouth every 30 (thirty) days. (Patient not taking: Reported on 03/24/2021)     tretinoin (RETIN-A) 0.025 % cream Apply 1 application topically at bedtime as needed (blemishes). (Patient not taking: Reported on 03/24/2021)     No current facility-administered medications for this visit.    SUMMARY OF ONCOLOGIC HISTORY: Oncology History Overview Note  Neg genetics   Primary peritoneal carcinomatosis ([place])  08/21/2020 Initial Diagnosis   The patient reports intermittent right upper quadrant pain that feel like she pulled  a muscle beginning in March 2022.  In April, she woke up with sharp pain in her right upper quadrant.  This was her  first episode of sharp pain.  She thought that her pain was related to her gallbladder and presented to the emergency department.  She was seen in ED on 4/30. RUQ ultrasound and x-ray were normal as were LFTs and lipase.    10/27/2020 Imaging   Findings highly suspicious for peritoneal carcinomatosis. No site of primary malignancy identified.   Colonic diverticulosis, without radiographic evidence of diverticulitis   11/02/2020 Initial Diagnosis   Primary peritoneal carcinomatosis ([place])   11/02/2020 Tumor Marker   Patient's tumor was tested for the following markers: CA-125. Results of the tumor marker test revealed 88.   11/11/2020 Pathology Results   FINAL MICROSCOPIC DIAGNOSIS:   A. CUL DE SAC, LEFT ANTERIOR, EXCISION:  -  Poorly differentiated carcinoma  -  See comment   B. SIDEWALL, RIGHT, BIOPSY:  -  Poorly differentiated carcinoma  -  See comment   COMMENT:   By immunohistochemistry, the neoplastic cells are positive for cytokeratin 7, p53, PAX8 and WT1 but negative for cytokeratin 20, GATA3, CDX2 and TTF-1.  Overall, the immunophenotype is consistent with a  gynecologic primary.    11/11/2020 Pathology Results   FINAL MICROSCOPIC DIAGNOSIS:  - Malignant cells consistent with metastatic adenocarcinoma    11/11/2020 Surgery   Pre-operative Diagnosis: Carcinomatosis, mildly elevated CA-125   Post-operative Diagnosis: same, At least stage IIIC gyn malignancy   Operation: Diagnostic laparoscopy, peritoneal biopsies    Surgeon: [name] Operative Findings: On EUA, small mobile uterus. On intra-abdominal entry, carcinomatosis appreciated studding the right anterior diaphragm, bilateral liver surfaces, mesentery, pelvic peritoneum. Omental cake with measuring up to 2x3cm. Right ovary adherent to the right uterus with peritoneal studding adjacent. Frondular implants noted along right pelvic sidewall. Miliary disease along anterior cul de sac. Cul de sac covered with  friable miliary disease. Small volume dark amber ascites. Specimens: left anterior cul de sac peritoneum, right pelvic sidewall peritoneal biopsy          11/15/2020 Cancer Staging   Staging form: Ovary, Fallopian Tube, and Primary Peritoneal Carcinoma, AJCC 8th Edition - Clinical stage from 11/15/2020: FIGO Stage III (cT3, cN0, cM0) - Signed by [name], MD on 11/15/2020 Stage prefix: Initial diagnosis    11/26/2020 - 05/13/2021 Chemotherapy   Patient is on Treatment Plan : OVARIAN Carboplatin (AUC 6) / Paclitaxel (175) q21d x 6 cycles     11/26/2020 Tumor Marker   Patient's tumor was tested for the following markers: CA-125. Results of the tumor marker test revealed 76.8.   12/17/2020 Tumor Marker   Patient's tumor was tested for the following markers: CA-125. Results of the tumor marker test revealed 58.1.   01/07/2021 Tumor Marker   Patient's tumor was tested for the following markers: CA-125. Results of the tumor marker test revealed 21.6.   01/20/2021 Imaging   Decreased omental soft tissue nodularity and caking, consistent with interval improvement in carcinoma.   No evidence of new or progressive disease within the abdomen or pelvis.   Colonic diverticulosis. No radiographic evidence of diverticulitis.   Large stool burden noted; recommend clinical correlation for possible constipation   03/14/2021 Tumor Marker   Patient's tumor was tested for the following markers: CA-125. Results of the tumor marker test revealed 32.7.   03/23/2021 Imaging   Unremarkable right upper quadrant ultrasound   04/01/2021 Genetic Testing   HRD status  through [employer] was negative.  The report date is 04/01/2021. Negative hereditary cancer genetic testing: no pathogenic variants detected in [employer] [facility].  The report date is 04/04/2021.  The [facility] gene panel offered by [employer] includes sequencing and deletion/duplication testing of the following 48 genes: APC, ATM, AXIN2,  BAP1, BARD1, BMPR1A, BRCA1, BRCA2, BRIP1, CHD1, CDK4, CDKN2A(p16 and p14ARF), CHEK2, CTNNA1, EGFR, EPCAM, FH, FLCN, GREM1, HOXB13, MEN1, MET, MITF , MLH1, MSH2, MSH3, MSH6, MUTYH, NTHL1, PALB2, PMS2, POLD1, POLE, PTEN, RAD51C, RAD51D, RET, SDHA, SDHB, SDHC, SDHD, SMAD4, STK11,TERT, TP53, TSC1, TSC2, and VHL.   04/12/2021 Tumor Marker   Patient's tumor was tested for the following markers: CA-125. Results of the tumor marker test revealed 12.7.   06/13/2021 Imaging   1. No new mass or lymphadenopathy identified in the abdomen or pelvis. Interval resolution of previous omental soft tissue caking. 2. Other ancillary findings.   06/13/2021 Tumor Marker   Patient's tumor was tested for the following markers: CA-125. Results of the tumor marker test revealed 10.1.     PHYSICAL EXAMINATION: ECOG PERFORMANCE STATUS: 0 - Asymptomatic  Vitals:   06/13/21 0831  BP: (!) 129/52  Pulse: 80  Resp: 18  Temp: 97.8 F (36.6 C)  SpO2: 100%   Filed Weights   06/13/21 0831  Weight: 116 lb 6.4 oz (52.8 kg)    GENERAL:alert, no distress and comfortable NEURO: alert & oriented x 3 with fluent speech, no focal motor/sensory deficits  LABORATORY DATA:  I have reviewed the data as listed    Component Value Date/Time   NA 141 06/10/2021 0811   K 3.8 06/10/2021 0811   CL 104 06/10/2021 0811   CO2 32 06/10/2021 0811   GLUCOSE 100 (H) 06/10/2021 0811   BUN 20 06/10/2021 0811   CREATININE 0.75 06/10/2021 0811   CALCIUM 9.2 06/10/2021 0811   PROT 6.8 06/10/2021 0811   ALBUMIN 4.5 06/10/2021 0811   AST 21 06/10/2021 0811   ALT 21 06/10/2021 0811   ALKPHOS 42 06/10/2021 0811   BILITOT 0.4 06/10/2021 0811   GFRNONAA >60 06/10/2021 0811   GFRAA  02/27/2007 1510    >60        The eGFR has been calculated using the MDRD equation. This calculation has not been validated in all clinical    No results found for: SPEP, UPEP  Lab Results  Component Value Date   WBC 5.6 06/10/2021   NEUTROABS  3.6 06/10/2021   HGB 13.0 06/10/2021   HCT 38.4 06/10/2021   MCV 89.5 06/10/2021   PLT 143 (L) 06/10/2021      Chemistry      Component Value Date/Time   NA 141 06/10/2021 0811   K 3.8 06/10/2021 0811   CL 104 06/10/2021 0811   CO2 32 06/10/2021 0811   BUN 20 06/10/2021 0811   CREATININE 0.75 06/10/2021 0811      Component Value Date/Time   CALCIUM 9.2 06/10/2021 0811   ALKPHOS 42 06/10/2021 0811   AST 21 06/10/2021 0811   ALT 21 06/10/2021 0811   BILITOT 0.4 06/10/2021 0811       RADIOGRAPHIC STUDIES: I have reviewed multiple CT imaging with the patient and family I have personally reviewed the radiological images as listed and agreed with the findings in the report. CT ABDOMEN PELVIS W CONTRAST  Result Date: 06/12/2021 CLINICAL DATA:  Ovarian cancer follow-up EXAM: CT ABDOMEN AND PELVIS WITH CONTRAST TECHNIQUE: Multidetector CT imaging of the abdomen and pelvis was  performed using the standard protocol following bolus administration of intravenous contrast. RADIATION DOSE REDUCTION: This exam was performed according to the departmental dose-optimization program which includes automated exposure control, adjustment of the mA and/or kV according to patient size and/or use of iterative reconstruction technique. CONTRAST:  169mL OMNIPAQUE IOHEXOL 300 MG/ML  SOLN COMPARISON:  CT abdomen and pelvis 01/19/2021 FINDINGS: Lower chest: No acute abnormality. Hepatobiliary: No focal liver abnormality is seen. No gallstones, gallbladder wall thickening, or biliary dilatation. Pancreas: Unremarkable. No pancreatic ductal dilatation or surrounding inflammatory changes. Spleen: Normal in size without focal abnormality. Adrenals/Urinary Tract: Adrenal glands are unremarkable. Kidneys are normal, without renal calculi, focal lesion, or hydronephrosis. Bladder is unremarkable. Stomach/Bowel: No bowel obstruction, free air or pneumatosis. No bowel wall edema or thickening identified. Moderate to large  amount of retained fecal material throughout the colon. Mild colonic diverticulosis. Appendix surgically absent. Vascular/Lymphatic: No significant vascular findings are present. No enlarged abdominal or pelvic lymph nodes. Reproductive: Status post hysterectomy. No adnexal masses. Other: Resolution of previous omental caking soft tissue densities. No ascites. Musculoskeletal: No acute or significant osseous findings. IMPRESSION: 1. No new mass or lymphadenopathy identified in the abdomen or pelvis. Interval resolution of previous omental soft tissue caking. 2. Other ancillary findings. Electronically Signed   By: Ofilia Neas M.D.   On: 06/12/2021 10:17

## 2021-06-13 NOTE — Assessment & Plan Note (Signed)
She has minimum trace thrombocytopenia from recent treatment Observe only

## 2021-06-13 NOTE — Assessment & Plan Note (Signed)
I have reviewed blood work and imaging studies with the patient We discussed the limitation of tumor marker monitoring She has complete response to treatment She have minimal to no residual side effects from chemotherapy We reviewed the current guidelines The patient has no detectable genetic mutations I do not recommend her to proceed with any further maintenance treatment with PARP inhibitor She will return in 3 months to see GYN oncologist for follow-up I will see her in 6 months The patient is educated to watch her for signs and symptoms of cancer recurrence

## 2021-07-07 ENCOUNTER — Telehealth: Payer: Self-pay

## 2021-07-07 NOTE — Telephone Encounter (Signed)
Returned her call. She is concerned that her her bm's have changed 10 days ago. She is eating, drinking and exercising as she normally does. She is having small constipated stools. Denies pain. Complaining of tenderness at surgical site at times and takes OTC that helps. She is taking at night 2 tabs occassionally from a Rx she was given by Dr. Berline Lopes. She is not sure of the name and is not at home. ? ?She is concerned that she is having a cancer reoccurrence. ?

## 2021-07-07 NOTE — Telephone Encounter (Signed)
Called and given below message. Added appts and she is aware of appt times. ?Instructed to start Miralax bid and Senokot 2 tabs TID. She verbalized understanding. ?

## 2021-07-07 NOTE — Telephone Encounter (Signed)
Labs tomorrow, see me Tuesday ?Bring her prescription/all meds ?

## 2021-07-08 ENCOUNTER — Other Ambulatory Visit: Payer: Self-pay

## 2021-07-08 ENCOUNTER — Inpatient Hospital Stay: Payer: Medicare Other | Attending: Gynecologic Oncology

## 2021-07-08 DIAGNOSIS — C482 Malignant neoplasm of peritoneum, unspecified: Secondary | ICD-10-CM | POA: Insufficient documentation

## 2021-07-08 DIAGNOSIS — R109 Unspecified abdominal pain: Secondary | ICD-10-CM | POA: Insufficient documentation

## 2021-07-08 DIAGNOSIS — Z9221 Personal history of antineoplastic chemotherapy: Secondary | ICD-10-CM | POA: Diagnosis not present

## 2021-07-08 DIAGNOSIS — G8928 Other chronic postprocedural pain: Secondary | ICD-10-CM | POA: Insufficient documentation

## 2021-07-08 DIAGNOSIS — K5909 Other constipation: Secondary | ICD-10-CM | POA: Insufficient documentation

## 2021-07-08 DIAGNOSIS — R194 Change in bowel habit: Secondary | ICD-10-CM | POA: Insufficient documentation

## 2021-07-08 LAB — CBC WITH DIFFERENTIAL (CANCER CENTER ONLY)
Abs Immature Granulocytes: 0.01 10*3/uL (ref 0.00–0.07)
Basophils Absolute: 0 10*3/uL (ref 0.0–0.1)
Basophils Relative: 0 %
Eosinophils Absolute: 0.1 10*3/uL (ref 0.0–0.5)
Eosinophils Relative: 1 %
HCT: 39.7 % (ref 36.0–46.0)
Hemoglobin: 13 g/dL (ref 12.0–15.0)
Immature Granulocytes: 0 %
Lymphocytes Relative: 19 %
Lymphs Abs: 1.4 10*3/uL (ref 0.7–4.0)
MCH: 29.7 pg (ref 26.0–34.0)
MCHC: 32.7 g/dL (ref 30.0–36.0)
MCV: 90.6 fL (ref 80.0–100.0)
Monocytes Absolute: 0.4 10*3/uL (ref 0.1–1.0)
Monocytes Relative: 5 %
Neutro Abs: 5.4 10*3/uL (ref 1.7–7.7)
Neutrophils Relative %: 75 %
Platelet Count: 169 10*3/uL (ref 150–400)
RBC: 4.38 MIL/uL (ref 3.87–5.11)
RDW: 12.5 % (ref 11.5–15.5)
WBC Count: 7.3 10*3/uL (ref 4.0–10.5)
nRBC: 0 % (ref 0.0–0.2)

## 2021-07-08 LAB — CMP (CANCER CENTER ONLY)
ALT: 18 U/L (ref 0–44)
AST: 21 U/L (ref 15–41)
Albumin: 4.7 g/dL (ref 3.5–5.0)
Alkaline Phosphatase: 43 U/L (ref 38–126)
Anion gap: 6 (ref 5–15)
BUN: 26 mg/dL — ABNORMAL HIGH (ref 8–23)
CO2: 30 mmol/L (ref 22–32)
Calcium: 9.8 mg/dL (ref 8.9–10.3)
Chloride: 103 mmol/L (ref 98–111)
Creatinine: 0.81 mg/dL (ref 0.44–1.00)
GFR, Estimated: >60 mL/min (ref >60)
Glucose, Bld: 104 mg/dL — ABNORMAL HIGH (ref 70–99)
Potassium: 4 mmol/L (ref 3.5–5.1)
Sodium: 139 mmol/L (ref 135–145)
Total Bilirubin: 0.5 mg/dL (ref 0.3–1.2)
Total Protein: 7.4 g/dL (ref 6.5–8.1)

## 2021-07-09 LAB — CA 125: Cancer Antigen (CA) 125: 9.4 U/mL (ref 0.0–38.1)

## 2021-07-12 ENCOUNTER — Inpatient Hospital Stay (HOSPITAL_BASED_OUTPATIENT_CLINIC_OR_DEPARTMENT_OTHER): Payer: Medicare Other | Admitting: Hematology and Oncology

## 2021-07-12 ENCOUNTER — Encounter: Payer: Self-pay | Admitting: Hematology and Oncology

## 2021-07-12 ENCOUNTER — Other Ambulatory Visit: Payer: Self-pay

## 2021-07-12 VITALS — BP 130/59 | HR 79 | Temp 97.7°F | Resp 18 | Ht 64.0 in | Wt 116.1 lb

## 2021-07-12 DIAGNOSIS — C482 Malignant neoplasm of peritoneum, unspecified: Secondary | ICD-10-CM

## 2021-07-12 DIAGNOSIS — K5909 Other constipation: Secondary | ICD-10-CM | POA: Diagnosis not present

## 2021-07-12 DIAGNOSIS — L7682 Other postprocedural complications of skin and subcutaneous tissue: Secondary | ICD-10-CM | POA: Diagnosis not present

## 2021-07-12 NOTE — Progress Notes (Signed)
[facility] OFFICE PROGRESS NOTE  Patient Care Team: Burton Apley, MD as PCP - General (Internal Medicine) Paulina Fusi Servando Snare, RN as Oncology Nurse Navigator (Oncology)  ASSESSMENT & PLAN:  Primary peritoneal carcinomatosis [name] With recent changes in bowel habits, it is understandable that she is concerned about cancer recurrence, especially with additional abdominal pain Her examination is benign Her blood work including recent tumor marker was normal The patient is reassured However, given her symptoms, I think it is not unreasonable to proceed with repeat imaging study in May That would be 3 months away from her latest CT imaging and she is in agreement with the plan of care  Other constipation She had recent altered bowel habits related to constipation After taking some laxative, she had multiple bowel movement I reviewed her imaging study again She does have slight dilatation of her bowels with moderate stool burden on recent CT imaging I suspect since her surgery, the patient is prone to develop constipation We discussed regular use of stool softener and laxatives as needed  Incisional pain She has slight discomfort/pain at the site of her incision I reviewed recent CT imaging with the patient which show no abnormalities We will continue close observation and as above, plan for another CT imaging in May  Orders Placed This Encounter  Procedures   CT ABDOMEN PELVIS W CONTRAST    Standing Status:   Future    Standing Expiration Date:   07/13/2022    Order Specific Question:   If indicated for the ordered procedure, I authorize the administration of contrast media per Radiology protocol    Answer:   Yes    Order Specific Question:   Preferred imaging location?    Answer:   [facility]    Order Specific Question:   Radiology Contrast Protocol - do NOT remove file path    Answer:   [email or web address]    All  questions were answered. The patient knows to call the clinic with any problems, questions or concerns. The total time spent in the appointment was 30 minutes encounter with patients including review of chart and various tests results, discussions about plan of care and coordination of care plan   Artis Delay, MD 07/12/2021 1:17 PM  INTERVAL HISTORY: Please see below for problem oriented charting. she returns for urgent evaluation due to recent changes in bowel habits Approximately 10 days ago, she noticed that she is becoming somewhat constipated and skipped her regular bowel movement She started to become concerned She started taking stool softener and when she started taking laxative, she had normal bowel movement She denies sensation of bloating, nausea or vomiting She has incisional pain/discomfort near the midline at previous surgical site that comes and goes She denies recent changes in her diet, hydration or physical activity No new medications that could contribute to her symptoms   REVIEW OF SYSTEMS:   Constitutional: Denies fevers, chills or abnormal weight loss Eyes: Denies blurriness of vision Ears, nose, mouth, throat, and face: Denies mucositis or sore throat Respiratory: Denies cough, dyspnea or wheezes Cardiovascular: Denies palpitation, chest discomfort or lower extremity swelling Skin: Denies abnormal skin rashes Lymphatics: Denies new lymphadenopathy or easy bruising Neurological:Denies numbness, tingling or new weaknesses Behavioral/Psych: Mood is stable, no new changes  All other systems were reviewed with the patient and are negative.  I have reviewed the past medical history, past surgical history, social history and family history with the patient and they are unchanged from previous note.  ALLERGIES:  is allergic to artichoke [cynara scolymus (artichoke)].  MEDICATIONS:  Current Outpatient Medications  Medication Sig Dispense Refill   Biotin 5000 MCG TABS  Take 5,000 mcg by mouth daily. (Patient not taking: Reported on 03/24/2021)     Calcium Carbonate-Vitamin D 600-400 MG-UNIT tablet Take 1 tablet by mouth daily. (Patient not taking: Reported on 03/24/2021)     Cholecalciferol 50 MCG (2000 UT) CAPS Take 2,000 Units by mouth daily. (Patient not taking: Reported on 03/24/2021)     Multiple Vitamins-Minerals (MULTIVITAMIN WITH MINERALS) tablet Take 1 tablet by mouth daily. (Patient not taking: Reported on 03/24/2021)     Polyvinyl Alcohol-Povidone (CLEAR EYES NATURAL TEARS) 5-6 MG/ML SOLN Place 1 drop into both eyes daily as needed (Dry eyes). (Patient not taking: Reported on 03/24/2021)     risedronate (ACTONEL) 150 MG tablet Take 150 mg by mouth every 30 (thirty) days. (Patient not taking: Reported on 03/24/2021)     tretinoin (RETIN-A) 0.025 % cream Apply 1 application topically at bedtime as needed (blemishes). (Patient not taking: Reported on 03/24/2021)     No current facility-administered medications for this visit.    SUMMARY OF ONCOLOGIC HISTORY: Oncology History Overview Note  Neg genetics   Primary peritoneal carcinomatosis (HCC)  08/21/2020 Initial Diagnosis   The patient reports intermittent right upper quadrant pain that feel like she pulled a muscle beginning in March 2022.  In April, she woke up with sharp pain in her right upper quadrant.  This was her first episode of sharp pain.  She thought that her pain was related to her gallbladder and presented to the emergency department.  She was seen in ED on 4/30. RUQ ultrasound and x-ray were normal as were LFTs and lipase.    10/27/2020 Imaging   Findings highly suspicious for peritoneal carcinomatosis. No site of primary malignancy identified.   Colonic diverticulosis, without radiographic evidence of diverticulitis   11/02/2020 Initial Diagnosis   Primary peritoneal carcinomatosis (HCC)   11/02/2020 Tumor Marker   Patient's tumor was tested for the following markers: CA-125. Results of  the tumor marker test revealed 88.   11/11/2020 Pathology Results   FINAL MICROSCOPIC DIAGNOSIS:   A. CUL DE SAC, LEFT ANTERIOR, EXCISION:  -  Poorly differentiated carcinoma  -  See comment   B. SIDEWALL, RIGHT, BIOPSY:  -  Poorly differentiated carcinoma  -  See comment   COMMENT:   By immunohistochemistry, the neoplastic cells are positive for cytokeratin 7, p53, PAX8 and WT1 but negative for cytokeratin 20, GATA3, CDX2 and TTF-1.  Overall, the immunophenotype is consistent with a  gynecologic primary.    11/11/2020 Pathology Results   FINAL MICROSCOPIC DIAGNOSIS:  - Malignant cells consistent with metastatic adenocarcinoma    11/11/2020 Surgery   Pre-operative Diagnosis: Carcinomatosis, mildly elevated CA-125   Post-operative Diagnosis: same, At least stage IIIC gyn malignancy   Operation: Diagnostic laparoscopy, peritoneal biopsies    Surgeon: Eugene Garnet MD Operative Findings: On EUA, small mobile uterus. On intra-abdominal entry, carcinomatosis appreciated studding the right anterior diaphragm, bilateral liver surfaces, mesentery, pelvic peritoneum. Omental cake with measuring up to 2x3cm. Right ovary adherent to the right uterus with peritoneal studding adjacent. Frondular implants noted along right pelvic sidewall. Miliary disease along anterior cul de sac. Cul de sac covered with friable miliary disease. Small volume dark amber ascites. Specimens: left anterior cul de sac peritoneum, right pelvic sidewall peritoneal biopsy          11/15/2020 Cancer Staging   Staging  form: Ovary, Fallopian Tube, and Primary Peritoneal Carcinoma, AJCC 8th Edition - Clinical stage from 11/15/2020: FIGO Stage III (cT3, cN0, cM0) - Signed by Artis Delay, MD on 11/15/2020 Stage prefix: Initial diagnosis    11/26/2020 - 05/13/2021 Chemotherapy   Patient is on Treatment Plan : OVARIAN Carboplatin (AUC 6) / Paclitaxel (175) q21d x 6 cycles     11/26/2020 Tumor Marker   Patient's tumor was  tested for the following markers: CA-125. Results of the tumor marker test revealed 76.8.   12/17/2020 Tumor Marker   Patient's tumor was tested for the following markers: CA-125. Results of the tumor marker test revealed 58.1.   01/07/2021 Tumor Marker   Patient's tumor was tested for the following markers: CA-125. Results of the tumor marker test revealed 21.6.   01/20/2021 Imaging   Decreased omental soft tissue nodularity and caking, consistent with interval improvement in carcinoma.   No evidence of new or progressive disease within the abdomen or pelvis.   Colonic diverticulosis. No radiographic evidence of diverticulitis.   Large stool burden noted; recommend clinical correlation for possible constipation   03/14/2021 Tumor Marker   Patient's tumor was tested for the following markers: CA-125. Results of the tumor marker test revealed 32.7.   03/23/2021 Imaging   Unremarkable right upper quadrant ultrasound   04/01/2021 Genetic Testing   HRD status through [employer] was negative.  The report date is 04/01/2021. Negative hereditary cancer genetic testing: no pathogenic variants detected in [employer].  The report date is 04/04/2021.  The [facility] gene panel offered by [employer] includes sequencing and deletion/duplication testing of the following 48 genes: APC, ATM, AXIN2, BAP1, BARD1, BMPR1A, BRCA1, BRCA2, BRIP1, CHD1, CDK4, CDKN2A(p16 and p14ARF), CHEK2, CTNNA1, EGFR, EPCAM, FH, FLCN, GREM1, HOXB13, MEN1, MET, MITF , MLH1, MSH2, MSH3, MSH6, MUTYH, NTHL1, PALB2, PMS2, POLD1, POLE, PTEN, RAD51C, RAD51D, RET, SDHA, SDHB, SDHC, SDHD, SMAD4, STK11,TERT, TP53, TSC1, TSC2, and VHL.   04/12/2021 Tumor Marker   Patient's tumor was tested for the following markers: CA-125. Results of the tumor marker test revealed 12.7.   06/13/2021 Imaging   1. No new mass or lymphadenopathy identified in the abdomen or pelvis. Interval resolution of previous omental soft tissue  caking. 2. Other ancillary findings.   06/13/2021 Tumor Marker   Patient's tumor was tested for the following markers: CA-125. Results of the tumor marker test revealed 10.1.   07/11/2021 Tumor Marker   Patient's tumor was tested for the following markers: CA-125. Results of the tumor marker test revealed 9.4.     PHYSICAL EXAMINATION: ECOG PERFORMANCE STATUS: 1 - Symptomatic but completely ambulatory  Vitals:   07/12/21 1223  BP: (!) 130/59  Pulse: 79  Resp: 18  Temp: 97.7 F (36.5 C)  SpO2: 100%   Filed Weights   07/12/21 1223  Weight: 116 lb 1.6 oz (52.7 kg)    GENERAL:alert, no distress and comfortable SKIN: skin color, texture, turgor are normal, no rashes or significant lesions EYES: normal, Conjunctiva are pink and non-injected, sclera clear OROPHARYNX:no exudate, no erythema and lips, buccal mucosa, and tongue normal  NECK: supple, thyroid normal size, non-tender, without nodularity LYMPH:  no palpable lymphadenopathy in the cervical, axillary or inguinal LUNGS: clear to auscultation and percussion with normal breathing effort HEART: regular rate & rhythm and no murmurs and no lower extremity edema ABDOMEN:abdomen soft, non-tender and normal bowel sounds.  No palpable abnormalities.  Her incision is well-healed Musculoskeletal:no cyanosis of digits and no clubbing  NEURO:  alert & oriented x 3 with fluent speech, no focal motor/sensory deficits  LABORATORY DATA:  I have reviewed the data as listed    Component Value Date/Time   NA 139 07/08/2021 1311   K 4.0 07/08/2021 1311   CL 103 07/08/2021 1311   CO2 30 07/08/2021 1311   GLUCOSE 104 (H) 07/08/2021 1311   BUN 26 (H) 07/08/2021 1311   CREATININE 0.81 07/08/2021 1311   CALCIUM 9.8 07/08/2021 1311   PROT 7.4 07/08/2021 1311   ALBUMIN 4.7 07/08/2021 1311   AST 21 07/08/2021 1311   ALT 18 07/08/2021 1311   ALKPHOS 43 07/08/2021 1311   BILITOT 0.5 07/08/2021 1311   GFRNONAA >60 07/08/2021 1311   GFRAA   02/27/2007 1510    >60        The eGFR has been calculated using the MDRD equation. This calculation has not been validated in all clinical    No results found for: SPEP, UPEP  Lab Results  Component Value Date   WBC 7.3 07/08/2021   NEUTROABS 5.4 07/08/2021   HGB 13.0 07/08/2021   HCT 39.7 07/08/2021   MCV 90.6 07/08/2021   PLT 169 07/08/2021      Chemistry      Component Value Date/Time   NA 139 07/08/2021 1311   K 4.0 07/08/2021 1311   CL 103 07/08/2021 1311   CO2 30 07/08/2021 1311   BUN 26 (H) 07/08/2021 1311   CREATININE 0.81 07/08/2021 1311      Component Value Date/Time   CALCIUM 9.8 07/08/2021 1311   ALKPHOS 43 07/08/2021 1311   AST 21 07/08/2021 1311   ALT 18 07/08/2021 1311   BILITOT 0.5 07/08/2021 1311       RADIOGRAPHIC STUDIES: I have reviewed her latest CT imaging with the patient I have personally reviewed the radiological images as listed and agreed with the findings in the report.

## 2021-07-12 NOTE — Assessment & Plan Note (Signed)
She has slight discomfort/pain at the site of her incision ?I reviewed recent CT imaging with the patient which show no abnormalities ?We will continue close observation and as above, plan for another CT imaging in May ?

## 2021-07-12 NOTE — Assessment & Plan Note (Signed)
She had recent altered bowel habits related to constipation ?After taking some laxative, she had multiple bowel movement ?I reviewed her imaging study again ?She does have slight dilatation of her bowels with moderate stool burden on recent CT imaging ?I suspect since her surgery, the patient is prone to develop constipation ?We discussed regular use of stool softener and laxatives as needed ?

## 2021-07-12 NOTE — Assessment & Plan Note (Signed)
With recent changes in bowel habits, it is understandable that she is concerned about cancer recurrence, especially with additional abdominal pain ?Her examination is benign ?Her blood work including recent tumor marker was normal ?The patient is reassured ?However, given her symptoms, I think it is not unreasonable to proceed with repeat imaging study in May ?That would be 3 months away from her latest CT imaging and she is in agreement with the plan of care ?

## 2021-08-18 ENCOUNTER — Encounter: Payer: Self-pay | Admitting: Gynecologic Oncology

## 2021-08-22 ENCOUNTER — Other Ambulatory Visit: Payer: Self-pay

## 2021-08-22 ENCOUNTER — Encounter: Payer: Self-pay | Admitting: Gynecologic Oncology

## 2021-08-22 ENCOUNTER — Inpatient Hospital Stay: Payer: Medicare Other | Attending: Gynecologic Oncology | Admitting: Gynecologic Oncology

## 2021-08-22 VITALS — BP 131/59 | HR 67 | Temp 98.5°F | Resp 16 | Ht 64.0 in | Wt 113.6 lb

## 2021-08-22 DIAGNOSIS — C482 Malignant neoplasm of peritoneum, unspecified: Secondary | ICD-10-CM

## 2021-08-22 DIAGNOSIS — Z8544 Personal history of malignant neoplasm of other female genital organs: Secondary | ICD-10-CM | POA: Diagnosis present

## 2021-08-22 DIAGNOSIS — Z9221 Personal history of antineoplastic chemotherapy: Secondary | ICD-10-CM | POA: Insufficient documentation

## 2021-08-22 DIAGNOSIS — Z90722 Acquired absence of ovaries, bilateral: Secondary | ICD-10-CM | POA: Insufficient documentation

## 2021-08-22 DIAGNOSIS — Z90710 Acquired absence of both cervix and uterus: Secondary | ICD-10-CM | POA: Diagnosis not present

## 2021-08-22 NOTE — Patient Instructions (Addendum)
It was good to see you today.  I do not see or feel any signs of cancer recurrence. ? ?We will continue alternating visits with Dr. Alvy Bimler.  I will see you in 6 months unless you develop concerning symptoms before then. ? ?Because my schedule is only out through the early fall, please call back sometime in August to get a visit scheduled to see me in October. ?

## 2021-08-22 NOTE — Progress Notes (Signed)
Gynecologic Oncology Return Clinic Visit ? ?08/22/2021 ? ?Reason for Visit: Surveillance visit in the setting of history of primary peritoneal cancer ? ?Treatment History: ?Oncology History Overview Note  ?Neg genetics ?  ?Primary peritoneal carcinomatosis ([place])  ?08/21/2020 Initial Diagnosis  ? The patient reports intermittent right upper quadrant pain that feel like she pulled a muscle beginning in March 2022.  In April, she woke up with sharp pain in her right upper quadrant.  This was her first episode of sharp pain.  She thought that her pain was related to her gallbladder and presented to the emergency department.  She was seen in ED on 4/30. RUQ ultrasound and x-ray were normal as were LFTs and lipase.  ?  ?10/27/2020 Imaging  ? Findings highly suspicious for peritoneal carcinomatosis. No site of primary malignancy identified. ?  ?Colonic diverticulosis, without radiographic evidence of diverticulitis ?  ?11/02/2020 Initial Diagnosis  ? Primary peritoneal carcinomatosis [facility]) ?  ?11/02/2020 Tumor Marker  ? Patient's tumor was tested for the following markers: CA-125. ?Results of the tumor marker test revealed 69. ?  ?11/11/2020 Pathology Results  ? FINAL MICROSCOPIC DIAGNOSIS:  ? ?A. CUL DE SAC, LEFT ANTERIOR, EXCISION:  ?-  Poorly differentiated carcinoma  ?-  See comment  ? ?B. SIDEWALL, RIGHT, BIOPSY:  ?-  Poorly differentiated carcinoma  ?-  See comment  ? ?COMMENT:  ? ?By immunohistochemistry, the neoplastic cells are positive for cytokeratin 7, p53, PAX8 and WT1 but negative for cytokeratin 20, GATA3, CDX2 and TTF-1.  Overall, the immunophenotype is consistent with a  ?gynecologic primary.  ?  ?11/11/2020 Pathology Results  ? FINAL MICROSCOPIC DIAGNOSIS:  ?- Malignant cells consistent with metastatic adenocarcinoma  ?  ?11/11/2020 Surgery  ? Pre-operative Diagnosis: Carcinomatosis, mildly elevated CA-125 ?  ?Post-operative Diagnosis: same, At least stage IIIC gyn malignancy ?  ?Operation: Diagnostic laparoscopy,  peritoneal biopsies  ?  ?Surgeon: [name] ?Operative Findings: On EUA, small mobile uterus. On intra-abdominal entry, carcinomatosis appreciated studding the right anterior diaphragm, bilateral liver surfaces, mesentery, pelvic peritoneum. Omental cake with measuring up to 2x3cm. Right ovary adherent to the right uterus with peritoneal studding adjacent. Frondular implants noted along right pelvic sidewall. Miliary disease along anterior cul de sac. Cul de sac covered with friable miliary disease. Small volume dark amber ascites. ?Specimens: left anterior cul de sac peritoneum, right pelvic sidewall peritoneal biopsy        ?  ?11/15/2020 Cancer Staging  ? Staging form: Ovary, Fallopian Tube, and Primary Peritoneal Carcinoma, AJCC 8th Edition ?- Clinical stage from 11/15/2020: FIGO Stage III (cT3, cN0, cM0) - Signed by [name], MD on 11/15/2020 ?Stage prefix: Initial diagnosis ? ?  ?11/26/2020 - 05/13/2021 Chemotherapy  ? Patient is on Treatment Plan : OVARIAN Carboplatin (AUC 6) / Paclitaxel (175) q21d x 6 cycles  ? ?  ?  ?11/26/2020 Tumor Marker  ? Patient's tumor was tested for the following markers: CA-125. ?Results of the tumor marker test revealed 76.8. ?  ?12/17/2020 Tumor Marker  ? Patient's tumor was tested for the following markers: CA-125. ?Results of the tumor marker test revealed 58.1. ?  ?01/07/2021 Tumor Marker  ? Patient's tumor was tested for the following markers: CA-125. ?Results of the tumor marker test revealed 21.6. ?  ?01/20/2021 Imaging  ? Decreased omental soft tissue nodularity and caking, consistent with interval improvement in carcinoma. ?  ?No evidence of new or progressive disease within the abdomen or pelvis. ?  ?Colonic diverticulosis. No radiographic evidence  of diverticulitis. ?  ?Large stool burden noted; recommend clinical correlation for possible constipation ?  ?02/24/2021 Surgery  ? Robotic-assisted laparoscopic total hysterectomy with bilateral salpingoophorectomy,  peritoneal stripping, mini-lap for omentectomy and intra-abdominal palpation ? ?Findings: On EUA, small mobile uterus. Rectum free. On intra-abdominal entry, normal upper abdominal survey including liver edge, diaphragm and stomach. Omentum with several small (<2cm) areas of thickening, suspected treated tumor versus residual tumor. Uterus 6cm and normal in appearance. Atrophic appearing bilateral adnexa. Some adhesions of the left ovary to the medial leaf of the broad ligament. Minimal peritoneal nodularity versus treated disease within posterior cul de sac and deep left pelvis, all either excised or ablated. No significant peritoneal disease or carcinomatosis. No sigmoid or rectal involvement. Small bowel normal in appearance. No adenopathy. No ascites. R0 resection.  ?  ?02/24/2021 Pathology Results  ? A. PERTIONEAL SCAR, EXCISION:  ?- Microscopic focus of high-grade serous carcinoma  ? ?B. 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 ?C. OMENTUM, RESECTION:  ?- Microscopic foci of high-grade serous carcinoma ?  ?03/14/2021 Tumor Marker  ? Patient's tumor was tested for the following markers: CA-125. ?Results of the tumor marker test revealed 32.7. ?  ?03/23/2021 Imaging  ? Unremarkable right upper quadrant ultrasound ?  ?04/01/2021 Genetic Testing  ? HRD status through [place] was negative.  The report date is 04/01/2021. Negative hereditary cancer genetic testing: no pathogenic variants detected in [employer].  The report date is 04/04/2021.  The [facility] gene panel offered by [employer] includes sequencing and deletion/duplication testing of the following 48 genes: APC, ATM, AXIN2, BAP1, BARD1, BMPR1A, BRCA1, BRCA2, BRIP1, CHD1, CDK4, CDKN2A(p16 and p14ARF),  CHEK2, CTNNA1, EGFR, EPCAM, FH, FLCN, GREM1, HOXB13, MEN1, MET, MITF , MLH1, MSH2, MSH3, MSH6, MUTYH, NTHL1, PALB2, PMS2, POLD1, POLE, PTEN, RAD51C, RAD51D, RET, SDHA, SDHB, SDHC, SDHD, SMAD4, STK11,TERT, TP53, TSC1, TSC2, and VHL. ?  ?04/12/2021 Tumor Marker  ? Patient's tumor was tested for the following markers: CA-125. ?Results of the tumor marker test revealed 12.7. ?  ?06/13/2021 Imaging  ? 1. No new mass or lymphadenopathy identified in the abdomen or pelvis. Interval resolution of previous omental soft tissue caking. ?2. Other ancillary findings. ?  ?06/13/2021 Tumor Marker  ? Patient's tumor was tested for the following markers: CA-125. ?Results of the tumor marker test revealed 10.1. ?  ?07/11/2021 Tumor Marker  ? Patient's tumor was tested for the following markers: CA-125. ?Results of the tumor marker test revealed 9.4. ?  ? ? ?Interval History: ?Completed adjuvant treatment in February. ? ?Patient reports doing very well.  She feels excellent after finishing chemotherapy.  She endorses good energy.  She is continuing to walk at least several days a week, 3 to 4 miles.  Reports good appetite without nausea or emesis.  Had an episode of constipation in March, use 1 over-the-counter laxative and has had normal bowel function since.  She still has some intermittent mild upper abdominal pain around her incision, does not require medications for the discomfort.  Denies any vaginal bleeding or discharge. ? ?Past Medical/Surgical History: ?Past Medical History:  ?Diagnosis Date  ? Anemia   ? Cancer [facility])   ? Family history of breast cancer 03/14/2021  ? [place], retinal   ? Right eye  ?  Osteoporosis   ? Polyp of colon, unspecified part of colon, unspecified type   ? Benign  ? ? ?Past Surgical History:  ?Procedure Laterality Date  ? APPENDECTOMY  02/27/2007  ? [facility]  ? BILATERAL SALPINGECTOMY Right 03/1978  ? right ectopic, tied other tube  ? CATARACT EXTRACTION Right 10/31/2011  ? Dr. Katy Fitch  ? COLONOSCOPY  03/2019  ?  Dr. Alessandra Bevels  ? COLONOSCOPY WITH PROPOFOL N/A 04/01/2018  ? Procedure: COLONOSCOPY WITH PROPOFOL;  Surgeon: Otis Brace, MD;  Location: [facility];  Service: Gastroenterology;  Laterality: N/A;  ? DILAT

## 2021-09-07 ENCOUNTER — Ambulatory Visit (HOSPITAL_COMMUNITY)
Admission: RE | Admit: 2021-09-07 | Discharge: 2021-09-07 | Disposition: A | Discharge status: Home / Self Care | Payer: Medicare Other | Source: Ambulatory Visit | Attending: Hematology and Oncology | Admitting: Hematology and Oncology

## 2021-09-07 ENCOUNTER — Inpatient Hospital Stay: Payer: Medicare Other

## 2021-09-07 ENCOUNTER — Other Ambulatory Visit: Payer: Self-pay

## 2021-09-07 DIAGNOSIS — C482 Malignant neoplasm of peritoneum, unspecified: Secondary | ICD-10-CM

## 2021-09-07 LAB — CMP (CANCER CENTER ONLY)
ALT: 15 U/L (ref 0–44)
AST: 20 U/L (ref 15–41)
Albumin: 4.8 g/dL (ref 3.5–5.0)
Alkaline Phosphatase: 44 U/L (ref 38–126)
Anion gap: 7 (ref 5–15)
BUN: 21 mg/dL (ref 8–23)
CO2: 30 mmol/L (ref 22–32)
Calcium: 9.6 mg/dL (ref 8.9–10.3)
Chloride: 104 mmol/L (ref 98–111)
Creatinine: 0.88 mg/dL (ref 0.44–1.00)
GFR, Estimated: >60 mL/min (ref >60)
Glucose, Bld: 70 mg/dL (ref 70–99)
Potassium: 4.2 mmol/L (ref 3.5–5.1)
Sodium: 141 mmol/L (ref 135–145)
Total Bilirubin: 0.7 mg/dL (ref 0.3–1.2)
Total Protein: 7.6 g/dL (ref 6.5–8.1)

## 2021-09-07 LAB — CBC WITH DIFFERENTIAL (CANCER CENTER ONLY)
Abs Immature Granulocytes: 0.02 10*3/uL (ref 0.00–0.07)
Basophils Absolute: 0 10*3/uL (ref 0.0–0.1)
Basophils Relative: 1 %
Eosinophils Absolute: 0.1 10*3/uL (ref 0.0–0.5)
Eosinophils Relative: 1 %
HCT: 41.3 % (ref 36.0–46.0)
Hemoglobin: 13.8 g/dL (ref 12.0–15.0)
Immature Granulocytes: 0 %
Lymphocytes Relative: 27 %
Lymphs Abs: 1.5 10*3/uL (ref 0.7–4.0)
MCH: 30.1 pg (ref 26.0–34.0)
MCHC: 33.4 g/dL (ref 30.0–36.0)
MCV: 90 fL (ref 80.0–100.0)
Monocytes Absolute: 0.3 10*3/uL (ref 0.1–1.0)
Monocytes Relative: 6 %
Neutro Abs: 3.8 10*3/uL (ref 1.7–7.7)
Neutrophils Relative %: 65 %
Platelet Count: 173 10*3/uL (ref 150–400)
RBC: 4.59 MIL/uL (ref 3.87–5.11)
RDW: 12 % (ref 11.5–15.5)
WBC Count: 5.7 10*3/uL (ref 4.0–10.5)
nRBC: 0 % (ref 0.0–0.2)

## 2021-09-07 MED ORDER — IOHEXOL 300 MG/ML  SOLN
100.0000 mL | Freq: Once | INTRAMUSCULAR | Status: AC | PRN
  Administered 2021-09-07: 100 mL via INTRAVENOUS

## 2021-09-08 LAB — CA 125: Cancer Antigen (CA) 125: 9 U/mL (ref 0.0–38.1)

## 2021-09-09 ENCOUNTER — Other Ambulatory Visit: Payer: Self-pay

## 2021-09-09 ENCOUNTER — Encounter: Payer: Self-pay | Admitting: Hematology and Oncology

## 2021-09-09 ENCOUNTER — Inpatient Hospital Stay (HOSPITAL_BASED_OUTPATIENT_CLINIC_OR_DEPARTMENT_OTHER): Payer: Medicare Other | Admitting: Hematology and Oncology

## 2021-09-09 DIAGNOSIS — L7682 Other postprocedural complications of skin and subcutaneous tissue: Secondary | ICD-10-CM

## 2021-09-09 DIAGNOSIS — C482 Malignant neoplasm of peritoneum, unspecified: Secondary | ICD-10-CM | POA: Diagnosis not present

## 2021-09-09 DIAGNOSIS — Z8544 Personal history of malignant neoplasm of other female genital organs: Secondary | ICD-10-CM | POA: Diagnosis not present

## 2021-09-09 NOTE — Assessment & Plan Note (Signed)
She continues to have intermittent discomfort on her abdominal wall likely due to prior surgical incision CT imaging show no evidence of disease She is reassured

## 2021-09-09 NOTE — Progress Notes (Signed)
[place] OFFICE PROGRESS NOTE  Patient Care Team: Lorene Dy, MD as PCP - General (Internal Medicine) Awanda Mink Craige Cotta, RN as Oncology Nurse Navigator (Oncology)  ASSESSMENT & PLAN:  Primary peritoneal carcinomatosis [facility]) I have reviewed blood work and imaging studies with the patient We discussed the limitation of tumor marker monitoring She has complete response to treatment She have minimal to no residual side effects from chemotherapy We reviewed the current guidelines The patient has no detectable genetic mutations She will return in 3 months to see GYN oncologist for follow-up I will see her in 6 months The patient is educated to watch her for signs and symptoms of cancer recurrence  Incisional pain She continues to have intermittent discomfort on her abdominal wall likely due to prior surgical incision CT imaging show no evidence of disease She is reassured  No orders of the defined types were placed in this encounter.   All questions were answered. The patient knows to call the clinic with any problems, questions or concerns. The total time spent in the appointment was 20 minutes encounter with patients including review of chart and various tests results, discussions about plan of care and coordination of care plan   [name], MD 09/09/2021 9:49 AM  INTERVAL HISTORY: Please see below for problem oriented charting. she returns for surveillance follow-up She has intermittent abdominal discomfort near the incision site Denies bloating or changes in bowel habits  REVIEW OF SYSTEMS:   Constitutional: Denies fevers, chills or abnormal weight loss Eyes: Denies blurriness of vision Ears, nose, mouth, throat, and face: Denies mucositis or sore throat Respiratory: Denies cough, dyspnea or wheezes Cardiovascular: Denies palpitation, chest discomfort or lower extremity swelling Gastrointestinal:  Denies nausea, heartburn or change in bowel habits Skin:  Denies abnormal skin rashes Lymphatics: Denies new lymphadenopathy or easy bruising Neurological:Denies numbness, tingling or new weaknesses Behavioral/Psych: Mood is stable, no new changes  All other systems were reviewed with the patient and are negative.  I have reviewed the past medical history, past surgical history, social history and family history with the patient and they are unchanged from previous note.  ALLERGIES:  is allergic to artichoke [cynara scolymus (artichoke)].  MEDICATIONS:  Current Outpatient Medications  Medication Sig Dispense Refill   Biotin 5000 MCG TABS Take 5,000 mcg by mouth daily. (Patient not taking: Reported on 03/24/2021)     Calcium Carbonate-Vitamin D 600-400 MG-UNIT tablet Take 1 tablet by mouth daily. (Patient not taking: Reported on 03/24/2021)     Cholecalciferol 50 MCG (2000 UT) CAPS Take 2,000 Units by mouth daily. (Patient not taking: Reported on 03/24/2021)     Multiple Vitamins-Minerals (MULTIVITAMIN WITH MINERALS) tablet Take 1 tablet by mouth daily. (Patient not taking: Reported on 03/24/2021)     Polyvinyl Alcohol-Povidone (CLEAR EYES NATURAL TEARS) 5-6 MG/ML SOLN Place 1 drop into both eyes daily as needed (Dry eyes). (Patient not taking: Reported on 03/24/2021)     risedronate (ACTONEL) 150 MG tablet Take 150 mg by mouth every 30 (thirty) days. (Patient not taking: Reported on 03/24/2021)     tretinoin (RETIN-A) 0.025 % cream Apply 1 application topically at bedtime as needed (blemishes). (Patient not taking: Reported on 03/24/2021)     No current facility-administered medications for this visit.    SUMMARY OF ONCOLOGIC HISTORY: Oncology History Overview Note  Neg genetics   Primary peritoneal carcinomatosis ([place])  08/21/2020 Initial Diagnosis   The patient reports intermittent right upper quadrant pain that feel like she pulled  a muscle beginning in March 2022.  In April, she woke up with sharp pain in her right upper quadrant.  This was her  first episode of sharp pain.  She thought that her pain was related to her gallbladder and presented to the emergency department.  She was seen in ED on 4/30. RUQ ultrasound and x-ray were normal as were LFTs and lipase.    10/27/2020 Imaging   Findings highly suspicious for peritoneal carcinomatosis. No site of primary malignancy identified.   Colonic diverticulosis, without radiographic evidence of diverticulitis   11/02/2020 Initial Diagnosis   Primary peritoneal carcinomatosis ([place])   11/02/2020 Tumor Marker   Patient's tumor was tested for the following markers: CA-125. Results of the tumor marker test revealed 88.   11/11/2020 Pathology Results   FINAL MICROSCOPIC DIAGNOSIS:   A. CUL DE SAC, LEFT ANTERIOR, EXCISION:  -  Poorly differentiated carcinoma  -  See comment   B. SIDEWALL, RIGHT, BIOPSY:  -  Poorly differentiated carcinoma  -  See comment   COMMENT:   By immunohistochemistry, the neoplastic cells are positive for cytokeratin 7, p53, PAX8 and WT1 but negative for cytokeratin 20, GATA3, CDX2 and TTF-1.  Overall, the immunophenotype is consistent with a  gynecologic primary.    11/11/2020 Pathology Results   FINAL MICROSCOPIC DIAGNOSIS:  - Malignant cells consistent with metastatic adenocarcinoma    11/11/2020 Surgery   Pre-operative Diagnosis: Carcinomatosis, mildly elevated CA-125   Post-operative Diagnosis: same, At least stage IIIC gyn malignancy   Operation: Diagnostic laparoscopy, peritoneal biopsies    Surgeon: [name] Operative Findings: On EUA, small mobile uterus. On intra-abdominal entry, carcinomatosis appreciated studding the right anterior diaphragm, bilateral liver surfaces, mesentery, pelvic peritoneum. Omental cake with measuring up to 2x3cm. Right ovary adherent to the right uterus with peritoneal studding adjacent. Frondular implants noted along right pelvic sidewall. Miliary disease along anterior cul de sac. Cul de sac covered with  friable miliary disease. Small volume dark amber ascites. Specimens: left anterior cul de sac peritoneum, right pelvic sidewall peritoneal biopsy          11/15/2020 Cancer Staging   Staging form: Ovary, Fallopian Tube, and Primary Peritoneal Carcinoma, AJCC 8th Edition - Clinical stage from 11/15/2020: FIGO Stage III (cT3, cN0, cM0) - Signed by [name], MD on 11/15/2020 Stage prefix: Initial diagnosis    11/26/2020 - 05/13/2021 Chemotherapy   Patient is on Treatment Plan : OVARIAN Carboplatin (AUC 6) / Paclitaxel (175) q21d x 6 cycles      11/26/2020 Tumor Marker   Patient's tumor was tested for the following markers: CA-125. Results of the tumor marker test revealed 76.8.   12/17/2020 Tumor Marker   Patient's tumor was tested for the following markers: CA-125. Results of the tumor marker test revealed 58.1.   01/07/2021 Tumor Marker   Patient's tumor was tested for the following markers: CA-125. Results of the tumor marker test revealed 21.6.   01/20/2021 Imaging   Decreased omental soft tissue nodularity and caking, consistent with interval improvement in carcinoma.   No evidence of new or progressive disease within the abdomen or pelvis.   Colonic diverticulosis. No radiographic evidence of diverticulitis.   Large stool burden noted; recommend clinical correlation for possible constipation   02/24/2021 Surgery   Robotic-assisted laparoscopic total hysterectomy with bilateral salpingoophorectomy, peritoneal stripping, mini-lap for omentectomy and intra-abdominal palpation  Findings: On EUA, small mobile uterus. Rectum free. On intra-abdominal entry, normal upper abdominal survey including liver edge, diaphragm and stomach.  Omentum with several small (<2cm) areas of thickening, suspected treated tumor versus residual tumor. Uterus 6cm and normal in appearance. Atrophic appearing bilateral adnexa. Some adhesions of the left ovary to the medial leaf of the broad ligament. Minimal  peritoneal nodularity versus treated disease within posterior cul de sac and deep left pelvis, all either excised or ablated. No significant peritoneal disease or carcinomatosis. No sigmoid or rectal involvement. Small bowel normal in appearance. No adenopathy. No ascites. R0 resection.    02/24/2021 Pathology Results   A. PERTIONEAL SCAR, EXCISION:  - Microscopic focus of high-grade serous carcinoma   B. 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 OMENTUM, RESECTION:  - Microscopic foci of high-grade serous carcinoma   03/14/2021 Tumor Marker   Patient's tumor was tested for the following markers: CA-125. Results of the tumor marker test revealed 32.7.   03/23/2021 Imaging   Unremarkable right upper quadrant ultrasound   04/01/2021 Genetic Testing   HRD status through [employer] was negative.  The report date is 04/01/2021. Negative hereditary cancer genetic testing: no pathogenic variants detected in [employer].  The report date is 04/04/2021.  The [facility] gene panel offered by [employer] includes sequencing and deletion/duplication testing of the following 48 genes: APC, ATM, AXIN2, BAP1, BARD1, BMPR1A, BRCA1, BRCA2, BRIP1, CHD1, CDK4, CDKN2A(p16 and p14ARF), CHEK2, CTNNA1, EGFR, EPCAM, FH, FLCN, GREM1, HOXB13, MEN1, MET, MITF , MLH1, MSH2, MSH3, MSH6, MUTYH, NTHL1, PALB2, PMS2, POLD1, POLE, PTEN, RAD51C, RAD51D, RET, SDHA, SDHB, SDHC, SDHD, SMAD4, STK11,TERT, TP53, TSC1, TSC2, and VHL.   04/12/2021 Tumor Marker   Patient's tumor was tested for the following markers: CA-125. Results of the tumor marker test revealed 12.7.   06/13/2021 Imaging   1. No new mass or lymphadenopathy identified in the abdomen or pelvis. Interval resolution of  previous omental soft tissue caking. 2. Other ancillary findings.   06/13/2021 Tumor Marker   Patient's tumor was tested for the following markers: CA-125. Results of the tumor marker test revealed 10.1.   07/11/2021 Tumor Marker   Patient's tumor was tested for the following markers: CA-125. Results of the tumor marker test revealed 9.4.   09/08/2021 Imaging   1. Status post hysterectomy and omentectomy. 2. No evidence of lymphadenopathy or metastatic disease in the abdomen or pelvis.     09/08/2021 Tumor Marker   Patient's tumor was tested for the following markers: CA-125. Results of the tumor marker test revealed 9.0.     PHYSICAL EXAMINATION: ECOG PERFORMANCE STATUS: 1 - Symptomatic but completely ambulatory  Vitals:   09/09/21 0914  BP: 138/63  Pulse: 80  Resp: 18  Temp: 98 F (36.7 C)  SpO2: 100%   Filed Weights   09/09/21 0914  Weight: 114 lb 9.6 oz (52 kg)    GENERAL:alert, no distress and comfortable NEURO: alert & oriented x 3 with fluent speech, no focal motor/sensory deficits  LABORATORY DATA:  I have reviewed the data as listed    Component Value Date/Time   NA 141 09/07/2021 0919   K 4.2 09/07/2021 0919   CL 104 09/07/2021 0919   CO2 30 09/07/2021 0919   GLUCOSE 70 09/07/2021 0919   BUN 21 09/07/2021 0919   CREATININE 0.88 09/07/2021 0919   CALCIUM 9.6 09/07/2021 0919  PROT 7.6 09/07/2021 0919   ALBUMIN 4.8 09/07/2021 0919   AST 20 09/07/2021 0919   ALT 15 09/07/2021 0919   ALKPHOS 44 09/07/2021 0919   BILITOT 0.7 09/07/2021 0919   GFRNONAA >60 09/07/2021 0919   GFRAA  02/27/2007 1510    >60        The eGFR has been calculated using the MDRD equation. This calculation has not been validated in all clinical    No results found for: SPEP, UPEP  Lab Results  Component Value Date   WBC 5.7 09/07/2021   NEUTROABS 3.8 09/07/2021   HGB 13.8 09/07/2021   HCT 41.3 09/07/2021   MCV 90.0 09/07/2021   PLT 173 09/07/2021      Chemistry       Component Value Date/Time   NA 141 09/07/2021 0919   K 4.2 09/07/2021 0919   CL 104 09/07/2021 0919   CO2 30 09/07/2021 0919   BUN 21 09/07/2021 0919   CREATININE 0.88 09/07/2021 0919      Component Value Date/Time   CALCIUM 9.6 09/07/2021 0919   ALKPHOS 44 09/07/2021 0919   AST 20 09/07/2021 0919   ALT 15 09/07/2021 0919   BILITOT 0.7 09/07/2021 0919       RADIOGRAPHIC STUDIES: I have reviewed imaging study with the patient I have personally reviewed the radiological images as listed and agreed with the findings in the report. CT ABDOMEN PELVIS W CONTRAST  Result Date: 09/08/2021 CLINICAL DATA:  Ovarian cancer, primary peritoneal carcinomatosis, assess treatment response * Tracking Code: BO * EXAM: CT ABDOMEN AND PELVIS WITH CONTRAST TECHNIQUE: Multidetector CT imaging of the abdomen and pelvis was performed using the standard protocol following bolus administration of intravenous contrast. RADIATION DOSE REDUCTION: This exam was performed according to the departmental dose-optimization program which includes automated exposure control, adjustment of the mA and/or kV according to patient size and/or use of iterative reconstruction technique. CONTRAST:  171mL OMNIPAQUE IOHEXOL 300 MG/ML SOLN, additional oral enteric contrast COMPARISON:  06/10/2021 FINDINGS: Lower chest: No acute abnormality. Hepatobiliary: No solid liver abnormality is seen. No gallstones, gallbladder wall thickening, or biliary dilatation. Pancreas: Unremarkable. No pancreatic ductal dilatation or surrounding inflammatory changes. Spleen: Normal in size without significant abnormality. Adrenals/Urinary Tract: Adrenal glands are unremarkable. Kidneys are normal, without renal calculi, solid lesion, or hydronephrosis. Bladder is unremarkable. Stomach/Bowel: Stomach is within normal limits. Appendix is surgically absent. No evidence of bowel wall thickening, distention, or inflammatory changes. Sigmoid diverticula. Status  post omentectomy. Vascular/Lymphatic: No significant vascular findings are present. No enlarged abdominal or pelvic lymph nodes. Reproductive: Status post hysterectomy. Other: No abdominal wall hernia or abnormality. No ascites. Musculoskeletal: No acute or significant osseous findings. IMPRESSION: 1. Status post hysterectomy and omentectomy. 2. No evidence of lymphadenopathy or metastatic disease in the abdomen or pelvis. Electronically Signed   By: Delanna Ahmadi M.D.   On: 09/08/2021 09:56

## 2021-09-09 NOTE — Assessment & Plan Note (Signed)
I have reviewed blood work and imaging studies with the patient We discussed the limitation of tumor marker monitoring She has complete response to treatment She have minimal to no residual side effects from chemotherapy We reviewed the current guidelines The patient has no detectable genetic mutations She will return in 3 months to see GYN oncologist for follow-up I will see her in 6 months The patient is educated to watch her for signs and symptoms of cancer recurrence

## 2021-09-15 ENCOUNTER — Telehealth: Payer: Self-pay | Admitting: *Deleted

## 2021-09-15 NOTE — Telephone Encounter (Signed)
Per Dr Alvy Bimler, scheduled the patient to see Dr Berline Lopes on 8/14

## 2021-11-22 ENCOUNTER — Telehealth: Payer: Self-pay

## 2021-11-22 DIAGNOSIS — C482 Malignant neoplasm of peritoneum, unspecified: Secondary | ICD-10-CM

## 2021-11-22 NOTE — Telephone Encounter (Signed)
Received message from patient requesting to speak with Dr. Berline Lopes or her nurse regarding abdominal tenderness.  Spoke with patient, she endorses abdominal tenderness that has been ongoing. She states she had a CT scan for this and her CA 125 checked in May 2023 which were all normal.   Over the last few weeks she has been experiencing increased tenderness. It is located in her mid, right abdomen where she bends. She reports after surgery there was a lump there and she is concerned with cancer recurrence as this was where her initial pain was. Per patient "I was told it could possibly be scar tissue". The pain comes and goes and is worse with increased activity. She denies fever, chills, nausea, vomiting, diarrhea or appetite changes. She states she is doing well otherwise. She walks 2.5-3 miles a day and has 2 BM's daily.   Patient is going to be going out of town on 11/25/21 and will be gone for 9 days. She is unsure if she needs her appointment with Dr. Berline Lopes moved up so she can be examined or if she can come in and have her CA 125 checked. She does not want another scan at this time.  Joylene John, NP notified. Per NP we can go ahead and check CA 125 this week. Depending on that a scan may be needed. Patient verbalized understanding.   Lab appointment scheduled for 11/23/21 at 10:45 am, patient is in agreement of appointment date and time.

## 2021-11-23 ENCOUNTER — Other Ambulatory Visit: Payer: Self-pay

## 2021-11-23 ENCOUNTER — Inpatient Hospital Stay: Payer: Medicare Other | Attending: Gynecologic Oncology

## 2021-11-23 DIAGNOSIS — C482 Malignant neoplasm of peritoneum, unspecified: Secondary | ICD-10-CM

## 2021-11-23 DIAGNOSIS — Z90710 Acquired absence of both cervix and uterus: Secondary | ICD-10-CM | POA: Insufficient documentation

## 2021-11-23 DIAGNOSIS — Z90722 Acquired absence of ovaries, bilateral: Secondary | ICD-10-CM | POA: Diagnosis not present

## 2021-11-23 DIAGNOSIS — Z9221 Personal history of antineoplastic chemotherapy: Secondary | ICD-10-CM | POA: Insufficient documentation

## 2021-11-23 DIAGNOSIS — Z8544 Personal history of malignant neoplasm of other female genital organs: Secondary | ICD-10-CM | POA: Diagnosis present

## 2021-11-25 ENCOUNTER — Telehealth: Payer: Self-pay | Admitting: *Deleted

## 2021-11-25 LAB — CA 125: Cancer Antigen (CA) 125: 9.3 U/mL (ref 0.0–38.1)

## 2021-11-25 NOTE — Telephone Encounter (Signed)
Spoke with pt to let her know that per Joylene John, Np her CA 125 is stable compared with previous values and within normal range. She states that her abdominal pain comes and goes but is manageable. Informed pt to give [place] a call if it worsens or becomes unmanageable. She verbalized understanding.

## 2021-12-07 ENCOUNTER — Encounter: Payer: Self-pay | Admitting: Gynecologic Oncology

## 2021-12-12 ENCOUNTER — Inpatient Hospital Stay (HOSPITAL_BASED_OUTPATIENT_CLINIC_OR_DEPARTMENT_OTHER): Payer: Medicare Other | Admitting: Gynecologic Oncology

## 2021-12-12 ENCOUNTER — Encounter: Payer: Self-pay | Admitting: Gynecologic Oncology

## 2021-12-12 VITALS — BP 119/63 | HR 74 | Temp 97.9°F | Resp 18 | Wt 116.1 lb

## 2021-12-12 DIAGNOSIS — C482 Malignant neoplasm of peritoneum, unspecified: Secondary | ICD-10-CM

## 2021-12-12 DIAGNOSIS — Z8544 Personal history of malignant neoplasm of other female genital organs: Secondary | ICD-10-CM

## 2021-12-12 NOTE — Progress Notes (Signed)
Gynecologic Oncology Return Clinic Visit  12/12/2021  Reason for Visit: Surveillance visit in the setting of history of primary peritoneal cancer  Treatment History: Oncology History Overview Note  Neg genetics   Primary peritoneal carcinomatosis ([place])  08/21/2020 Initial Diagnosis   The patient reports intermittent right upper quadrant pain that feel like she pulled a muscle beginning in March 2022.  In April, she woke up with sharp pain in her right upper quadrant.  This was her first episode of sharp pain.  She thought that her pain was related to her gallbladder and presented to the emergency department.  She was seen in ED on 4/30. RUQ ultrasound and x-ray were normal as were LFTs and lipase.    10/27/2020 Imaging   Findings highly suspicious for peritoneal carcinomatosis. No site of primary malignancy identified.   Colonic diverticulosis, without radiographic evidence of diverticulitis   11/02/2020 Initial Diagnosis   Primary peritoneal carcinomatosis ([place])   11/02/2020 Tumor Marker   Patient's tumor was tested for the following markers: CA-125. Results of the tumor marker test revealed 88.   11/11/2020 Pathology Results   FINAL MICROSCOPIC DIAGNOSIS:   A. CUL DE SAC, LEFT ANTERIOR, EXCISION:  -  Poorly differentiated carcinoma  -  See comment   B. SIDEWALL, RIGHT, BIOPSY:  -  Poorly differentiated carcinoma  -  See comment   COMMENT:   By immunohistochemistry, the neoplastic cells are positive for cytokeratin 7, p53, PAX8 and WT1 but negative for cytokeratin 20, GATA3, CDX2 and TTF-1.  Overall, the immunophenotype is consistent with a  gynecologic primary.    11/11/2020 Pathology Results   FINAL MICROSCOPIC DIAGNOSIS:  - Malignant cells consistent with metastatic adenocarcinoma    11/11/2020 Surgery   Pre-operative Diagnosis: Carcinomatosis, mildly elevated CA-125   Post-operative Diagnosis: same, At least stage IIIC gyn malignancy   Operation: Diagnostic laparoscopy,  peritoneal biopsies    Surgeon: [name] Operative Findings: On EUA, small mobile uterus. On intra-abdominal entry, carcinomatosis appreciated studding the right anterior diaphragm, bilateral liver surfaces, mesentery, pelvic peritoneum. Omental cake with measuring up to 2x3cm. Right ovary adherent to the right uterus with peritoneal studding adjacent. Frondular implants noted along right pelvic sidewall. Miliary disease along anterior cul de sac. Cul de sac covered with friable miliary disease. Small volume dark amber ascites. Specimens: left anterior cul de sac peritoneum, right pelvic sidewall peritoneal biopsy          11/15/2020 Cancer Staging   Staging form: Ovary, Fallopian Tube, and Primary Peritoneal Carcinoma, AJCC 8th Edition - Clinical stage from 11/15/2020: FIGO Stage III (cT3, cN0, cM0) - Signed by [name], MD on 11/15/2020 Stage prefix: Initial diagnosis   11/26/2020 - 05/13/2021 Chemotherapy   Patient is on Treatment Plan : OVARIAN Carboplatin (AUC 6) / Paclitaxel (175) q21d x 6 cycles     11/26/2020 Tumor Marker   Patient's tumor was tested for the following markers: CA-125. Results of the tumor marker test revealed 76.8.   12/17/2020 Tumor Marker   Patient's tumor was tested for the following markers: CA-125. Results of the tumor marker test revealed 58.1.   01/07/2021 Tumor Marker   Patient's tumor was tested for the following markers: CA-125. Results of the tumor marker test revealed 21.6.   01/20/2021 Imaging   Decreased omental soft tissue nodularity and caking, consistent with interval improvement in carcinoma.   No evidence of new or progressive disease within the abdomen or pelvis.   Colonic diverticulosis. No radiographic evidence of diverticulitis.  Large stool burden noted; recommend clinical correlation for possible constipation   02/24/2021 Surgery   Robotic-assisted laparoscopic total hysterectomy with bilateral salpingoophorectomy, peritoneal  stripping, mini-lap for omentectomy and intra-abdominal palpation  Findings: On EUA, small mobile uterus. Rectum free. On intra-abdominal entry, normal upper abdominal survey including liver edge, diaphragm and stomach. Omentum with several small (<2cm) areas of thickening, suspected treated tumor versus residual tumor. Uterus 6cm and normal in appearance. Atrophic appearing bilateral adnexa. Some adhesions of the left ovary to the medial leaf of the broad ligament. Minimal peritoneal nodularity versus treated disease within posterior cul de sac and deep left pelvis, all either excised or ablated. No significant peritoneal disease or carcinomatosis. No sigmoid or rectal involvement. Small bowel normal in appearance. No adenopathy. No ascites. R0 resection.    02/24/2021 Pathology Results   A. PERTIONEAL SCAR, EXCISION:  - Microscopic focus of high-grade serous carcinoma   B. 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 OMENTUM, RESECTION:  - Microscopic foci of high-grade serous carcinoma   03/14/2021 Tumor Marker   Patient's tumor was tested for the following markers: CA-125. Results of the tumor marker test revealed 32.7.   03/23/2021 Imaging   Unremarkable right upper quadrant ultrasound   04/01/2021 Genetic Testing   HRD status through [employer] was negative.  The report date is 04/01/2021. Negative hereditary cancer genetic testing: no pathogenic variants detected in [employer].  The report date is 04/04/2021.  The [facility] gene panel offered by [employer] includes sequencing and deletion/duplication testing of the following 48 genes: APC, ATM, AXIN2, BAP1, BARD1, BMPR1A, BRCA1, BRCA2, BRIP1, CHD1, CDK4, CDKN2A(p16 and p14ARF), CHEK2, CTNNA1,  EGFR, EPCAM, FH, FLCN, GREM1, HOXB13, MEN1, MET, MITF , MLH1, MSH2, MSH3, MSH6, MUTYH, NTHL1, PALB2, PMS2, POLD1, POLE, PTEN, RAD51C, RAD51D, RET, SDHA, SDHB, SDHC, SDHD, SMAD4, STK11,TERT, TP53, TSC1, TSC2, and VHL.   04/12/2021 Tumor Marker   Patient's tumor was tested for the following markers: CA-125. Results of the tumor marker test revealed 12.7.   06/13/2021 Imaging   1. No new mass or lymphadenopathy identified in the abdomen or pelvis. Interval resolution of previous omental soft tissue caking. 2. Other ancillary findings.   06/13/2021 Tumor Marker   Patient's tumor was tested for the following markers: CA-125. Results of the tumor marker test revealed 10.1.   07/11/2021 Tumor Marker   Patient's tumor was tested for the following markers: CA-125. Results of the tumor marker test revealed 9.4.   09/08/2021 Imaging   1. Status post hysterectomy and omentectomy. 2. No evidence of lymphadenopathy or metastatic disease in the abdomen or pelvis.     09/08/2021 Tumor Marker   Patient's tumor was tested for the following markers: CA-125. Results of the tumor marker test revealed 9.0.     Interval History: Doing well.  Continues to have some mild soreness along the right side of her midline incision, notices this when she bends down or is more active.  Denies any recent change.  Denies other abdominal or pelvic pain.  Is walking 2-1/2 to 3 miles daily.  Reports regular bowel bladder function.  Denies any vaginal bleeding or discharge.  Reports good appetite without nausea or emesis.  Past Medical/Surgical History: Past Medical History:  Diagnosis Date   Anemia  Cancer [facility])    Family history of breast cancer 03/14/2021   Hole, retinal    Right eye   Osteoporosis    Polyp of colon, unspecified part of colon, unspecified type    Benign    Past Surgical History:  Procedure Laterality Date   APPENDECTOMY  02/27/2007   lsc   BILATERAL SALPINGECTOMY Right 03/1978   right  ectopic, tied other tube   CATARACT EXTRACTION Right 10/31/2011   Dr. Katy Fitch   COLONOSCOPY  03/2019   Dr. Alessandra Bevels   COLONOSCOPY WITH PROPOFOL N/A 04/01/2018   Procedure: COLONOSCOPY WITH PROPOFOL;  Surgeon: Otis Brace, MD;  Location: [facility];  Service: Gastroenterology;  Laterality: N/A;   DILATION AND CURETTAGE OF UTERUS     x3, 2 for miscarriages, one for polyp vs fibroid   HAND / FINGER LESION EXCISION Left    lt. index finger   LAPAROSCOPY N/A 11/11/2020   Procedure: LAPAROSCOPY DIAGNOSTIC WITH PERITONEAL BIOPSY;  Surgeon: [name], MD;  Location: [facility];  Service: Gynecology;  Laterality: N/A;   POLYPECTOMY  04/01/2018   Procedure: POLYPECTOMY;  Surgeon: Otis Brace, MD;  Location: [facility];  Service: Gastroenterology;;   RETINAL TEAR REPAIR CRYOTHERAPY Right 04/25/2011   Dr. Dominic Pea INJECTION  04/01/2018   Procedure: SUBMUCOSAL INJECTION;  Surgeon: Otis Brace, MD;  Location: [facility];  Service: Gastroenterology;;   TUBAL LIGATION Left 03/1978    Family History  Problem Relation Age of Onset   Lung cancer Mother 86       smoking hx   Skin cancer Father        dx after 63; non-melanoma; scalp   Lung cancer Maternal Aunt        dx after 59; smoking hx   Breast cancer Daughter 73       ER+   Skin cancer Cousin        face; surgery only   Ovarian cancer Neg Hx    Endometrial cancer Neg Hx    Prostate cancer Neg Hx    Pancreatic cancer Neg Hx    Colon cancer Neg Hx     Social History   Socioeconomic History   Marital status: Legally Separated    Spouse name: Not on file   Number of children: Not on file   Years of education: Not on file   Highest education level: Not on file  Occupational History   Not on file  Tobacco Use   Smoking status: Never    Passive exposure: Past (18 years)   Smokeless tobacco: Never  Vaping Use   Vaping Use: Never used  Substance and Sexual Activity   Alcohol use: Not  Currently   Drug use: Never   Sexual activity: Not Currently  Other Topics Concern   Not on file  Social History Narrative   Not on file   Social Determinants of Health   Financial Resource Strain: Not on file  Food Insecurity: Not on file  Transportation Needs: Not on file  Physical Activity: Not on file  Stress: Not on file  Social Connections: Not on file    Current Medications:  Current Outpatient Medications:    tretinoin (RETIN-A) 3.557 % cream, 1 application a pearl-sized amount to face in the evening Externally Once a day for 30 days, Disp: , Rfl:   Review of Systems: Denies appetite changes, fevers, chills, fatigue, unexplained weight changes. Denies hearing loss, neck lumps or masses, mouth sores, ringing in ears or voice  changes. Denies cough or wheezing.  Denies shortness of breath. Denies chest pain or palpitations. Denies leg swelling. Denies abdominal distention, blood in stools, constipation, diarrhea, nausea, vomiting, or early satiety. Denies pain with intercourse, dysuria, frequency, hematuria or incontinence. Denies hot flashes, pelvic pain, vaginal bleeding or vaginal discharge.   Denies joint pain, back pain or muscle pain/cramps. Denies itching, rash, or wounds. Denies dizziness, headaches, numbness or seizures. Denies swollen lymph nodes or glands, denies easy bruising or bleeding. Denies anxiety, depression, confusion, or decreased concentration.  Physical Exam: BP 119/63 (BP Location: Left Arm, Patient Position: Sitting)   Pulse 74   Temp 97.9 F (36.6 C) (Oral)   Resp 18   Wt 116 lb 2 oz (52.7 kg)   SpO2 98%   BMI 19.93 kg/m  General: Alert, oriented, no acute distress. HEENT: Normocephalic, atraumatic, sclera anicteric. Chest: Clear to auscultation bilaterally.  No wheezes or rhonchi.   Cardiovascular: Regular rate and rhythm, no murmurs. Abdomen: soft, nontender.  Normoactive bowel sounds.  No masses or hepatosplenomegaly appreciated.   Well-healed incisions.  I am able to induce mild tenderness with deep palpation just to the right of the midline incision.  No discrete mass appreciated. Extremities: Grossly normal range of motion.  Warm, well perfused.  No edema bilaterally. Skin: No rashes or lesions noted. Lymphatics: No cervical, supraclavicular, or inguinal adenopathy. GU: Normal appearing external genitalia without erythema, excoriation, or lesions.  Speculum exam reveals mildly atrophic vaginal mucosa, no lesions noted.  Bimanual exam reveals cuff intact, no nodularity or masses.  Rectovaginal exam confirms findings.  Laboratory & Radiologic Studies: Component Ref Range & Units 2 wk ago 3 mo ago 5 mo ago 6 mo ago 7 mo ago 8 mo ago 9 mo ago  Cancer Antigen (CA) 125 0.0 - 38.1 U/mL 9.3  9.0 CM  9.4 CM  10.1 CM  11.6 CM  12.7 CM  32.7 CM    CT A/P 09/07/21: 1. Status post hysterectomy and omentectomy. 2. No evidence of lymphadenopathy or metastatic disease in the abdomen or pelvis.  Assessment & Plan: Robin Sellers is a 71 y.o. 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 Genetic testing was negative.   Patient is doing very well and is NED on exam today.   Recent CA-125 was normal.   Discussed intermittent pain near her incision.  I suspect this is related to scar tissue.  Went back and looked at the images with her from CT scan in May.  There is some mild thickening here but no evidence of hernia.  I have asked her to call me if she has any increase in pain, increased frequency of the pain, or other changes to how she is feeling.   Per NCCN surveillance recommendations, we will continue with visits every 3 months alternating between Dr. Alvy Bimler and myself.  Patient is scheduled to see Dr. Alvy Bimler in November.  She will return to see me in February.  Patient will get CA-125 at the  time of her visits.  This was mildly elevated (in the 80s) at the time of her presentation.  We reviewed signs and symptoms that would be concerning for disease recurrence and I stressed the importance of calling if she develops any of these.   22 minutes of total time was spent for this patient encounter, including preparation, face-to-face counseling with the patient and coordination  of care, and documentation of the encounter.  Jeral Pinch, MD  Division of Gynecologic Oncology  Department of Obstetrics and Gynecology  [facility]

## 2021-12-12 NOTE — Patient Instructions (Addendum)
It was good to see you today.  I do not see or feel any evidence of cancer on your exam today.  You see Dr. Alvy Bimler in November.  Please call my office sometime in December or January to schedule a visit to see me in February 2024.  As always, if you develop any new and concerning symptoms before your next visit, please call to see me sooner.  Some options for female PCP: Rachell Cipro, MD Reather Converse, MD

## 2021-12-15 ENCOUNTER — Other Ambulatory Visit: Payer: Medicare Other

## 2021-12-15 ENCOUNTER — Ambulatory Visit: Payer: Medicare Other | Admitting: Hematology and Oncology

## 2021-12-21 ENCOUNTER — Other Ambulatory Visit: Payer: Self-pay | Admitting: Internal Medicine

## 2021-12-21 DIAGNOSIS — Z1231 Encounter for screening mammogram for malignant neoplasm of breast: Secondary | ICD-10-CM

## 2021-12-27 ENCOUNTER — Ambulatory Visit
Admission: RE | Admit: 2021-12-27 | Discharge: 2021-12-27 | Disposition: A | Discharge status: Home / Self Care | Payer: Medicare Other | Source: Ambulatory Visit | Attending: Internal Medicine | Admitting: Internal Medicine

## 2021-12-27 DIAGNOSIS — Z1231 Encounter for screening mammogram for malignant neoplasm of breast: Secondary | ICD-10-CM

## 2022-01-17 ENCOUNTER — Other Ambulatory Visit: Payer: Self-pay | Admitting: Gynecologic Oncology

## 2022-01-17 ENCOUNTER — Telehealth: Payer: Self-pay

## 2022-01-17 DIAGNOSIS — C482 Malignant neoplasm of peritoneum, unspecified: Secondary | ICD-10-CM

## 2022-01-17 NOTE — Telephone Encounter (Signed)
Pt states she was seen by Robin Sellers in August. She states at that time she was having abdominal pain. It had gotten better.However, now the pain isn't worse, just more constant, like a burning. It starts midline abdomen and radiates to upper right quadrant. She is eating/drinking fine, walks 2-3 miles per day when she can. BM's/urinating fine (no pain/pressure/burning). No vaginal bleeding/no fever. She states it isn't stopping her from doing anything, she is just nervous because this type of symptoms is how they found the original cancer.

## 2022-01-17 NOTE — Telephone Encounter (Signed)
Pt is scheduled for a CT scan on Friday 9/29 @ 8:30am. Pt is aware

## 2022-01-17 NOTE — Telephone Encounter (Signed)
Great thank you!

## 2022-01-19 ENCOUNTER — Telehealth: Payer: Self-pay

## 2022-01-19 NOTE — Telephone Encounter (Signed)
Patient called in and stated that she has a CT scan scheduled for tomorrow 9/29.  She requested that no documents or records from the CT scan be sent to her PCP.  She stated the PCP is her ex-husband and she doesn't want him to know anything about the scan or results.  Relayed this information to Dr Berline Lopes.

## 2022-01-20 ENCOUNTER — Ambulatory Visit (HOSPITAL_COMMUNITY)
Admission: RE | Admit: 2022-01-20 | Discharge: 2022-01-20 | Disposition: A | Discharge status: Home / Self Care | Payer: Medicare Other | Source: Ambulatory Visit | Attending: Gynecologic Oncology | Admitting: Gynecologic Oncology

## 2022-01-20 ENCOUNTER — Encounter (HOSPITAL_COMMUNITY): Payer: Self-pay

## 2022-01-20 DIAGNOSIS — C482 Malignant neoplasm of peritoneum, unspecified: Secondary | ICD-10-CM | POA: Insufficient documentation

## 2022-01-20 MED ORDER — SODIUM CHLORIDE (PF) 0.9 % IJ SOLN
INTRAMUSCULAR | Status: AC
  Filled 2022-01-20: qty 50

## 2022-01-20 MED ORDER — IOHEXOL 300 MG/ML  SOLN
75.0000 mL | Freq: Once | INTRAMUSCULAR | Status: AC | PRN
  Administered 2022-01-20: 75 mL via INTRAVENOUS

## 2022-01-25 ENCOUNTER — Telehealth: Payer: Self-pay | Admitting: Gynecologic Oncology

## 2022-01-25 NOTE — Telephone Encounter (Signed)
Called the patient.  Reviewed CT scan.  She is currently in [place] helping a friend who is having eye surgery.  She then travels to [facility] to see her granddaughter.  She will call when she gets back to let me know if things have worsened at all with regard to her right upper abdominal pain.  If so, she will plan to come in early for a CA-125 later this month.  Otherwise, she will see Dr. Alvy Bimler in mid November.  [name] Gynecologic Oncology

## 2022-03-07 ENCOUNTER — Other Ambulatory Visit: Payer: Self-pay | Admitting: Hematology and Oncology

## 2022-03-07 DIAGNOSIS — C482 Malignant neoplasm of peritoneum, unspecified: Secondary | ICD-10-CM

## 2022-03-08 ENCOUNTER — Inpatient Hospital Stay: Payer: Medicare Other | Attending: Hematology and Oncology

## 2022-03-08 DIAGNOSIS — R944 Abnormal results of kidney function studies: Secondary | ICD-10-CM | POA: Insufficient documentation

## 2022-03-08 DIAGNOSIS — Z90722 Acquired absence of ovaries, bilateral: Secondary | ICD-10-CM | POA: Insufficient documentation

## 2022-03-08 DIAGNOSIS — Z9221 Personal history of antineoplastic chemotherapy: Secondary | ICD-10-CM | POA: Diagnosis not present

## 2022-03-08 DIAGNOSIS — E860 Dehydration: Secondary | ICD-10-CM | POA: Diagnosis not present

## 2022-03-08 DIAGNOSIS — Z9079 Acquired absence of other genital organ(s): Secondary | ICD-10-CM | POA: Diagnosis not present

## 2022-03-08 DIAGNOSIS — Z90710 Acquired absence of both cervix and uterus: Secondary | ICD-10-CM | POA: Insufficient documentation

## 2022-03-08 DIAGNOSIS — Z8543 Personal history of malignant neoplasm of ovary: Secondary | ICD-10-CM | POA: Insufficient documentation

## 2022-03-08 DIAGNOSIS — C482 Malignant neoplasm of peritoneum, unspecified: Secondary | ICD-10-CM

## 2022-03-08 LAB — COMPREHENSIVE METABOLIC PANEL WITH GFR
ALT: 16 U/L (ref 0–44)
AST: 20 U/L (ref 15–41)
Albumin: 4.9 g/dL (ref 3.5–5.0)
Alkaline Phosphatase: 39 U/L (ref 38–126)
Anion gap: 5 (ref 5–15)
BUN: 25 mg/dL — ABNORMAL HIGH (ref 8–23)
CO2: 30 mmol/L (ref 22–32)
Calcium: 9.8 mg/dL (ref 8.9–10.3)
Chloride: 105 mmol/L (ref 98–111)
Creatinine, Ser: 0.94 mg/dL (ref 0.44–1.00)
GFR, Estimated: >60 mL/min
Glucose, Bld: 102 mg/dL — ABNORMAL HIGH (ref 70–99)
Potassium: 4.6 mmol/L (ref 3.5–5.1)
Sodium: 140 mmol/L (ref 135–145)
Total Bilirubin: 0.9 mg/dL (ref 0.3–1.2)
Total Protein: 7.5 g/dL (ref 6.5–8.1)

## 2022-03-08 LAB — CBC WITH DIFFERENTIAL/PLATELET
Abs Immature Granulocytes: 0.01 10*3/uL (ref 0.00–0.07)
Basophils Absolute: 0 10*3/uL (ref 0.0–0.1)
Basophils Relative: 0 %
Eosinophils Absolute: 0.1 10*3/uL (ref 0.0–0.5)
Eosinophils Relative: 2 %
HCT: 42.3 % (ref 36.0–46.0)
Hemoglobin: 14.1 g/dL (ref 12.0–15.0)
Immature Granulocytes: 0 %
Lymphocytes Relative: 33 %
Lymphs Abs: 1.8 10*3/uL (ref 0.7–4.0)
MCH: 29.7 pg (ref 26.0–34.0)
MCHC: 33.3 g/dL (ref 30.0–36.0)
MCV: 89.2 fL (ref 80.0–100.0)
Monocytes Absolute: 0.4 10*3/uL (ref 0.1–1.0)
Monocytes Relative: 6 %
Neutro Abs: 3.2 10*3/uL (ref 1.7–7.7)
Neutrophils Relative %: 59 %
Platelets: 179 10*3/uL (ref 150–400)
RBC: 4.74 MIL/uL (ref 3.87–5.11)
RDW: 12.1 % (ref 11.5–15.5)
WBC: 5.5 10*3/uL (ref 4.0–10.5)
nRBC: 0 % (ref 0.0–0.2)

## 2022-03-09 ENCOUNTER — Encounter: Payer: Self-pay | Admitting: Hematology and Oncology

## 2022-03-09 ENCOUNTER — Inpatient Hospital Stay (HOSPITAL_BASED_OUTPATIENT_CLINIC_OR_DEPARTMENT_OTHER): Payer: Medicare Other | Admitting: Hematology and Oncology

## 2022-03-09 VITALS — BP 118/52 | HR 74 | Temp 97.4°F | Resp 18 | Ht 64.0 in | Wt 115.0 lb

## 2022-03-09 DIAGNOSIS — C482 Malignant neoplasm of peritoneum, unspecified: Secondary | ICD-10-CM

## 2022-03-09 DIAGNOSIS — R799 Abnormal finding of blood chemistry, unspecified: Secondary | ICD-10-CM | POA: Insufficient documentation

## 2022-03-09 DIAGNOSIS — Z8543 Personal history of malignant neoplasm of ovary: Secondary | ICD-10-CM | POA: Diagnosis not present

## 2022-03-09 NOTE — Progress Notes (Signed)
[place] OFFICE PROGRESS NOTE  Patient Care Team: Lorene Dy, MD as PCP - General (Internal Medicine) Awanda Mink Craige Cotta, RN as Oncology Nurse Navigator (Oncology)  ASSESSMENT & PLAN:  Primary peritoneal carcinomatosis [facility]) I have reviewed blood work and imaging studies  Tumor marker is pending, we will call her with test results She will return in 3 months to see GYN oncologist for follow-up I will see her in 6 months The patient is educated to watch her for signs and symptoms of cancer recurrence  Elevated BUN This is due to dehydration We discussed strategies to improve her oral fluid intake  No orders of the defined types were placed in this encounter.   All questions were answered. The patient knows to call the clinic with any problems, questions or concerns. The total time spent in the appointment was 20 minutes encounter with patients including review of chart and various tests results, discussions about plan of care and coordination of care plan   [name], MD 03/09/2022 10:05 AM  INTERVAL HISTORY: Please see below for problem oriented charting. she returns for surveillance follow-up for history of primary peritoneal carcinomatosis She had recent CT imaging end of September due to abdominal pain that was thought to be related to mild abdominal muscle wall injury from lifting She was helping a friend who is relocating She denies bloating or changes in bowel habits She has some concern over elevated BUN from recent blood work  REVIEW OF SYSTEMS:   Constitutional: Denies fevers, chills or abnormal weight loss Eyes: Denies blurriness of vision Ears, nose, mouth, throat, and face: Denies mucositis or sore throat Respiratory: Denies cough, dyspnea or wheezes Cardiovascular: Denies palpitation, chest discomfort or lower extremity swelling Gastrointestinal:  Denies nausea, heartburn or change in bowel habits Skin: Denies abnormal skin rashes Lymphatics:  Denies new lymphadenopathy or easy bruising Neurological:Denies numbness, tingling or new weaknesses Behavioral/Psych: Mood is stable, no new changes  All other systems were reviewed with the patient and are negative.  I have reviewed the past medical history, past surgical history, social history and family history with the patient and they are unchanged from previous note.  ALLERGIES:  is allergic to artichoke [cynara scolymus (artichoke)].  MEDICATIONS:  Current Outpatient Medications  Medication Sig Dispense Refill   tretinoin (RETIN-A) 3.244 % cream 1 application a pearl-sized amount to face in the evening Externally Once a day for 30 days     No current facility-administered medications for this visit.    SUMMARY OF ONCOLOGIC HISTORY: Oncology History Overview Note  Neg genetics   Primary peritoneal carcinomatosis ([place])  08/21/2020 Initial Diagnosis   The patient reports intermittent right upper quadrant pain that feel like she pulled a muscle beginning in March 2022.  In April, she woke up with sharp pain in her right upper quadrant.  This was her first episode of sharp pain.  She thought that her pain was related to her gallbladder and presented to the emergency department.  She was seen in ED on 4/30. RUQ ultrasound and x-ray were normal as were LFTs and lipase.    10/27/2020 Imaging   Findings highly suspicious for peritoneal carcinomatosis. No site of primary malignancy identified.   Colonic diverticulosis, without radiographic evidence of diverticulitis   11/02/2020 Initial Diagnosis   Primary peritoneal carcinomatosis ([place])   11/02/2020 Tumor Marker   Patient's tumor was tested for the following markers: CA-125. Results of the tumor marker test revealed 88.   11/11/2020 Pathology Results  FINAL MICROSCOPIC DIAGNOSIS:   A. CUL DE SAC, LEFT ANTERIOR, EXCISION:  -  Poorly differentiated carcinoma  -  See comment   B. SIDEWALL, RIGHT, BIOPSY:  -  Poorly differentiated  carcinoma  -  See comment   COMMENT:   By immunohistochemistry, the neoplastic cells are positive for cytokeratin 7, p53, PAX8 and WT1 but negative for cytokeratin 20, GATA3, CDX2 and TTF-1.  Overall, the immunophenotype is consistent with a  gynecologic primary.    11/11/2020 Pathology Results   FINAL MICROSCOPIC DIAGNOSIS:  - Malignant cells consistent with metastatic adenocarcinoma    11/11/2020 Surgery   Pre-operative Diagnosis: Carcinomatosis, mildly elevated CA-125   Post-operative Diagnosis: same, At least stage IIIC gyn malignancy   Operation: Diagnostic laparoscopy, peritoneal biopsies    Surgeon: [name] Operative Findings: On EUA, small mobile uterus. On intra-abdominal entry, carcinomatosis appreciated studding the right anterior diaphragm, bilateral liver surfaces, mesentery, pelvic peritoneum. Omental cake with measuring up to 2x3cm. Right ovary adherent to the right uterus with peritoneal studding adjacent. Frondular implants noted along right pelvic sidewall. Miliary disease along anterior cul de sac. Cul de sac covered with friable miliary disease. Small volume dark amber ascites. Specimens: left anterior cul de sac peritoneum, right pelvic sidewall peritoneal biopsy          11/15/2020 Cancer Staging   Staging form: Ovary, Fallopian Tube, and Primary Peritoneal Carcinoma, AJCC 8th Edition - Clinical stage from 11/15/2020: FIGO Stage III (cT3, cN0, cM0) - Signed by [name], MD on 11/15/2020 Stage prefix: Initial diagnosis   11/26/2020 - 05/13/2021 Chemotherapy   Patient is on Treatment Plan : OVARIAN Carboplatin (AUC 6) / Paclitaxel (175) q21d x 6 cycles     11/26/2020 Tumor Marker   Patient's tumor was tested for the following markers: CA-125. Results of the tumor marker test revealed 76.8.   12/17/2020 Tumor Marker   Patient's tumor was tested for the following markers: CA-125. Results of the tumor marker test revealed 58.1.   01/07/2021 Tumor Marker    Patient's tumor was tested for the following markers: CA-125. Results of the tumor marker test revealed 21.6.   01/20/2021 Imaging   Decreased omental soft tissue nodularity and caking, consistent with interval improvement in carcinoma.   No evidence of new or progressive disease within the abdomen or pelvis.   Colonic diverticulosis. No radiographic evidence of diverticulitis.   Large stool burden noted; recommend clinical correlation for possible constipation   02/24/2021 Surgery   Robotic-assisted laparoscopic total hysterectomy with bilateral salpingoophorectomy, peritoneal stripping, mini-lap for omentectomy and intra-abdominal palpation  Findings: On EUA, small mobile uterus. Rectum free. On intra-abdominal entry, normal upper abdominal survey including liver edge, diaphragm and stomach. Omentum with several small (<2cm) areas of thickening, suspected treated tumor versus residual tumor. Uterus 6cm and normal in appearance. Atrophic appearing bilateral adnexa. Some adhesions of the left ovary to the medial leaf of the broad ligament. Minimal peritoneal nodularity versus treated disease within posterior cul de sac and deep left pelvis, all either excised or ablated. No significant peritoneal disease or carcinomatosis. No sigmoid or rectal involvement. Small bowel normal in appearance. No adenopathy. No ascites. R0 resection.    02/24/2021 Pathology Results   A. PERTIONEAL SCAR, EXCISION:  - Microscopic focus of high-grade serous carcinoma   B. 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 OMENTUM, RESECTION:  - Microscopic foci of high-grade serous carcinoma   03/14/2021 Tumor Marker   Patient's tumor was tested for the following markers: CA-125. Results of the  tumor marker test revealed 32.7.   03/23/2021 Imaging   Unremarkable right upper quadrant ultrasound   04/01/2021 Genetic Testing   HRD status through [facility] was negative.  The report date is 04/01/2021. Negative hereditary cancer genetic testing: no pathogenic variants detected in [employer] [facility].  The report date is 04/04/2021.  The [facility] gene panel offered by [employer] includes sequencing and deletion/duplication testing of the following 48 genes: APC, ATM, AXIN2, BAP1, BARD1, BMPR1A, BRCA1, BRCA2, BRIP1, CHD1, CDK4, CDKN2A(p16 and p14ARF), CHEK2, CTNNA1, EGFR, EPCAM, FH, FLCN, GREM1, HOXB13, MEN1, MET, MITF , MLH1, MSH2, MSH3, MSH6, MUTYH, NTHL1, PALB2, PMS2, POLD1, POLE, PTEN, RAD51C, RAD51D, RET, SDHA, SDHB, SDHC, SDHD, SMAD4, STK11,TERT, TP53, TSC1, TSC2, and VHL.   04/12/2021 Tumor Marker   Patient's tumor was tested for the following markers: CA-125. Results of the tumor marker test revealed 12.7.   06/13/2021 Imaging   1. No new mass or lymphadenopathy identified in the abdomen or pelvis. Interval resolution of previous omental soft tissue caking. 2. Other ancillary findings.   06/13/2021 Tumor Marker   Patient's tumor was tested for the following markers: CA-125. Results of the tumor marker test revealed 10.1.   07/11/2021 Tumor Marker   Patient's tumor was tested for the following markers: CA-125. Results of the tumor marker test revealed 9.4.   09/08/2021 Imaging   1. Status post hysterectomy and omentectomy. 2. No evidence of lymphadenopathy or metastatic disease in the abdomen or pelvis.     09/08/2021 Tumor Marker   Patient's tumor was tested for the following markers: CA-125. Results of the tumor marker test revealed 9.0.   02/22/2022 Imaging   IMPRESSION: 1. No acute findings.  No ascites, mass or adenopathy. 2.  Aortic Atherosclerosis (ICD10-170.0).     PHYSICAL EXAMINATION: ECOG PERFORMANCE STATUS: 0 - Asymptomatic  Vitals:   03/09/22  0929  BP: (!) 118/52  Pulse: 74  Resp: 18  Temp: (!) 97.4 F (36.3 C)  SpO2: 100%   Filed Weights   03/09/22 0929  Weight: 115 lb (52.2 kg)    GENERAL:alert, no distress and comfortable SKIN: skin color, texture, turgor are normal, no rashes or significant lesions EYES: normal, Conjunctiva are pink and non-injected, sclera clear OROPHARYNX:no exudate, no erythema and lips, buccal mucosa, and tongue normal  NECK: supple, thyroid normal size, non-tender, without nodularity LYMPH:  no palpable lymphadenopathy in the cervical, axillary or inguinal LUNGS: clear to auscultation and percussion with normal breathing effort HEART: regular rate & rhythm and no murmurs and no lower extremity edema ABDOMEN:abdomen soft, non-tender and normal bowel sounds Musculoskeletal:no cyanosis of digits and no clubbing  NEURO: alert & oriented x 3 with fluent speech, no focal motor/sensory deficits  LABORATORY DATA:  I have reviewed the data as listed    Component Value Date/Time   NA 140 03/08/2022 0930   K 4.6 03/08/2022 0930   CL 105 03/08/2022 0930   CO2 30 03/08/2022 0930   GLUCOSE 102 (H) 03/08/2022 0930   BUN 25 (H) 03/08/2022 0930   CREATININE 0.94 03/08/2022 0930   CREATININE 0.88 09/07/2021 0919   CALCIUM 9.8 03/08/2022 0930   PROT 7.5 03/08/2022 0930   ALBUMIN 4.9 03/08/2022 0930   AST 20 03/08/2022 0930   AST 20  09/07/2021 0919   ALT 16 03/08/2022 0930   ALT 15 09/07/2021 0919   ALKPHOS 39 03/08/2022 0930   BILITOT 0.9 03/08/2022 0930   BILITOT 0.7 09/07/2021 0919   GFRNONAA >60 03/08/2022 0930   GFRNONAA >60 09/07/2021 0919   GFRAA  02/27/2007 1510    >60        The eGFR has been calculated using the MDRD equation. This calculation has not been validated in all clinical    No results found for: "SPEP", "UPEP"  Lab Results  Component Value Date   WBC 5.5 03/08/2022   NEUTROABS 3.2 03/08/2022   HGB 14.1 03/08/2022   HCT 42.3 03/08/2022   MCV 89.2 03/08/2022   PLT  179 03/08/2022      Chemistry      Component Value Date/Time   NA 140 03/08/2022 0930   K 4.6 03/08/2022 0930   CL 105 03/08/2022 0930   CO2 30 03/08/2022 0930   BUN 25 (H) 03/08/2022 0930   CREATININE 0.94 03/08/2022 0930   CREATININE 0.88 09/07/2021 0919      Component Value Date/Time   CALCIUM 9.8 03/08/2022 0930   ALKPHOS 39 03/08/2022 0930   AST 20 03/08/2022 0930   AST 20 09/07/2021 0919   ALT 16 03/08/2022 0930   ALT 15 09/07/2021 0919   BILITOT 0.9 03/08/2022 0930   BILITOT 0.7 09/07/2021 0919

## 2022-03-09 NOTE — Assessment & Plan Note (Signed)
I have reviewed blood work and imaging studies  Tumor marker is pending, we will call her with test results She will return in 3 months to see GYN oncologist for follow-up I will see her in 6 months The patient is educated to watch her for signs and symptoms of cancer recurrence

## 2022-03-09 NOTE — Assessment & Plan Note (Signed)
This is due to dehydration We discussed strategies to improve her oral fluid intake

## 2022-03-10 ENCOUNTER — Telehealth: Payer: Self-pay

## 2022-03-10 ENCOUNTER — Ambulatory Visit: Payer: Medicare Other | Admitting: Hematology and Oncology

## 2022-03-10 LAB — CA 125: Cancer Antigen (CA) 125: 11.7 U/mL (ref 0.0–38.1)

## 2022-03-10 NOTE — Telephone Encounter (Signed)
Called and given below message. She verbalized understanding. 

## 2022-03-10 NOTE — Telephone Encounter (Signed)
-----   Message from [name], MD sent at 03/10/2022  7:33 AM EST ----- Pls let her know CA-125 is stable

## 2022-04-13 ENCOUNTER — Telehealth: Payer: Self-pay | Admitting: *Deleted

## 2022-04-13 NOTE — Telephone Encounter (Signed)
Patient called and scheduled a follow up appt and lab work with Dr Berline Lopes in February

## 2022-06-07 ENCOUNTER — Inpatient Hospital Stay: Payer: Medicare Other | Attending: Hematology and Oncology

## 2022-06-07 ENCOUNTER — Encounter: Payer: Self-pay | Admitting: Gynecologic Oncology

## 2022-06-07 DIAGNOSIS — Z90710 Acquired absence of both cervix and uterus: Secondary | ICD-10-CM | POA: Diagnosis not present

## 2022-06-07 DIAGNOSIS — C482 Malignant neoplasm of peritoneum, unspecified: Secondary | ICD-10-CM

## 2022-06-07 DIAGNOSIS — Z90722 Acquired absence of ovaries, bilateral: Secondary | ICD-10-CM | POA: Insufficient documentation

## 2022-06-07 DIAGNOSIS — Z9079 Acquired absence of other genital organ(s): Secondary | ICD-10-CM | POA: Diagnosis not present

## 2022-06-07 DIAGNOSIS — Z9221 Personal history of antineoplastic chemotherapy: Secondary | ICD-10-CM | POA: Diagnosis not present

## 2022-06-07 DIAGNOSIS — Z8543 Personal history of malignant neoplasm of ovary: Secondary | ICD-10-CM | POA: Diagnosis present

## 2022-06-07 LAB — CBC WITH DIFFERENTIAL/PLATELET
Abs Immature Granulocytes: 0.02 10*3/uL (ref 0.00–0.07)
Basophils Absolute: 0.1 10*3/uL (ref 0.0–0.1)
Basophils Relative: 1 %
Eosinophils Absolute: 0.1 10*3/uL (ref 0.0–0.5)
Eosinophils Relative: 1 %
HCT: 39.7 % (ref 36.0–46.0)
Hemoglobin: 13.7 g/dL (ref 12.0–15.0)
Immature Granulocytes: 0 %
Lymphocytes Relative: 22 %
Lymphs Abs: 1.7 10*3/uL (ref 0.7–4.0)
MCH: 30.4 pg (ref 26.0–34.0)
MCHC: 34.5 g/dL (ref 30.0–36.0)
MCV: 88.2 fL (ref 80.0–100.0)
Monocytes Absolute: 0.4 10*3/uL (ref 0.1–1.0)
Monocytes Relative: 5 %
Neutro Abs: 5.4 10*3/uL (ref 1.7–7.7)
Neutrophils Relative %: 71 %
Platelets: 174 10*3/uL (ref 150–400)
RBC: 4.5 MIL/uL (ref 3.87–5.11)
RDW: 12.3 % (ref 11.5–15.5)
WBC: 7.5 10*3/uL (ref 4.0–10.5)
nRBC: 0 % (ref 0.0–0.2)

## 2022-06-07 LAB — COMPREHENSIVE METABOLIC PANEL
ALT: 15 U/L (ref 0–44)
AST: 18 U/L (ref 15–41)
Albumin: 4.6 g/dL (ref 3.5–5.0)
Alkaline Phosphatase: 40 U/L (ref 38–126)
Anion gap: 4 — ABNORMAL LOW (ref 5–15)
BUN: 15 mg/dL (ref 8–23)
CO2: 30 mmol/L (ref 22–32)
Calcium: 9.5 mg/dL (ref 8.9–10.3)
Chloride: 103 mmol/L (ref 98–111)
Creatinine, Ser: 0.74 mg/dL (ref 0.44–1.00)
GFR, Estimated: >60 mL/min (ref >60)
Glucose, Bld: 98 mg/dL (ref 70–99)
Potassium: 4.2 mmol/L (ref 3.5–5.1)
Sodium: 137 mmol/L (ref 135–145)
Total Bilirubin: 0.5 mg/dL (ref 0.3–1.2)
Total Protein: 7.1 g/dL (ref 6.5–8.1)

## 2022-06-09 ENCOUNTER — Inpatient Hospital Stay (HOSPITAL_BASED_OUTPATIENT_CLINIC_OR_DEPARTMENT_OTHER): Payer: Medicare Other | Admitting: Gynecologic Oncology

## 2022-06-09 VITALS — BP 138/51 | HR 85 | Temp 97.7°F | Resp 14 | Wt 112.4 lb

## 2022-06-09 DIAGNOSIS — Z8543 Personal history of malignant neoplasm of ovary: Secondary | ICD-10-CM | POA: Diagnosis not present

## 2022-06-09 DIAGNOSIS — R1011 Right upper quadrant pain: Secondary | ICD-10-CM | POA: Diagnosis not present

## 2022-06-09 DIAGNOSIS — C482 Malignant neoplasm of peritoneum, unspecified: Secondary | ICD-10-CM

## 2022-06-09 LAB — CA 125: Cancer Antigen (CA) 125: 29.2 U/mL (ref 0.0–38.1)

## 2022-06-09 NOTE — Progress Notes (Signed)
Gynecologic Oncology Return Clinic Visit  06/09/22  Reason for Visit: Surveillance visit in the setting of history of primary peritoneal cancer   Treatment History: Oncology History Overview Note  Neg genetics   Primary peritoneal carcinomatosis ([name])  08/21/2020 Initial Diagnosis   The patient reports intermittent right upper quadrant pain that feel like she pulled a muscle beginning in March 2022.  In April, she woke up with sharp pain in her right upper quadrant.  This was her first episode of sharp pain.  She thought that her pain was related to her gallbladder and presented to the emergency department.  She was seen in ED on 4/30. RUQ ultrasound and x-ray were normal as were LFTs and lipase.    10/27/2020 Imaging   Findings highly suspicious for peritoneal carcinomatosis. No site of primary malignancy identified.   Colonic diverticulosis, without radiographic evidence of diverticulitis   11/02/2020 Initial Diagnosis   Primary peritoneal carcinomatosis ([name])   11/02/2020 Tumor Marker   Patient's tumor was tested for the following markers: CA-125. Results of the tumor marker test revealed 88.   11/11/2020 Pathology Results   FINAL MICROSCOPIC DIAGNOSIS:   A. CUL DE SAC, LEFT ANTERIOR, EXCISION:  -  Poorly differentiated carcinoma  -  See comment   B. SIDEWALL, RIGHT, BIOPSY:  -  Poorly differentiated carcinoma  -  See comment   COMMENT:   By immunohistochemistry, the neoplastic cells are positive for cytokeratin 7, p53, PAX8 and WT1 but negative for cytokeratin 20, GATA3, CDX2 and TTF-1.  Overall, the immunophenotype is consistent with a  gynecologic primary.    11/11/2020 Pathology Results   FINAL MICROSCOPIC DIAGNOSIS:  - Malignant cells consistent with metastatic adenocarcinoma    11/11/2020 Surgery   Pre-operative Diagnosis: Carcinomatosis, mildly elevated CA-125   Post-operative Diagnosis: same, At least stage IIIC gyn malignancy   Operation: Diagnostic laparoscopy,  peritoneal biopsies    Surgeon: [name] Operative Findings: On EUA, small mobile uterus. On intra-abdominal entry, carcinomatosis appreciated studding the right anterior diaphragm, bilateral liver surfaces, mesentery, pelvic peritoneum. Omental cake with measuring up to 2x3cm. Right ovary adherent to the right uterus with peritoneal studding adjacent. Frondular implants noted along right pelvic sidewall. Miliary disease along anterior cul de sac. Cul de sac covered with friable miliary disease. Small volume dark amber ascites. Specimens: left anterior cul de sac peritoneum, right pelvic sidewall peritoneal biopsy          11/15/2020 Cancer Staging   Staging form: Ovary, Fallopian Tube, and Primary Peritoneal Carcinoma, AJCC 8th Edition - Clinical stage from 11/15/2020: FIGO Stage III (cT3, cN0, cM0) - Signed by [name], MD on 11/15/2020 Stage prefix: Initial diagnosis   11/26/2020 - 05/13/2021 Chemotherapy   Patient is on Treatment Plan : OVARIAN Carboplatin (AUC 6) / Paclitaxel (175) q21d x 6 cycles     11/26/2020 Tumor Marker   Patient's tumor was tested for the following markers: CA-125. Results of the tumor marker test revealed 76.8.   12/17/2020 Tumor Marker   Patient's tumor was tested for the following markers: CA-125. Results of the tumor marker test revealed 58.1.   01/07/2021 Tumor Marker   Patient's tumor was tested for the following markers: CA-125. Results of the tumor marker test revealed 21.6.   01/20/2021 Imaging   Decreased omental soft tissue nodularity and caking, consistent with interval improvement in carcinoma.   No evidence of new or progressive disease within the abdomen or pelvis.   Colonic diverticulosis. No radiographic evidence of diverticulitis.  Large stool burden noted; recommend clinical correlation for possible constipation   02/24/2021 Surgery   Robotic-assisted laparoscopic total hysterectomy with bilateral salpingoophorectomy, peritoneal  stripping, mini-lap for omentectomy and intra-abdominal palpation  Findings: On EUA, small mobile uterus. Rectum free. On intra-abdominal entry, normal upper abdominal survey including liver edge, diaphragm and stomach. Omentum with several small (<2cm) areas of thickening, suspected treated tumor versus residual tumor. Uterus 6cm and normal in appearance. Atrophic appearing bilateral adnexa. Some adhesions of the left ovary to the medial leaf of the broad ligament. Minimal peritoneal nodularity versus treated disease within posterior cul de sac and deep left pelvis, all either excised or ablated. No significant peritoneal disease or carcinomatosis. No sigmoid or rectal involvement. Small bowel normal in appearance. No adenopathy. No ascites. R0 resection.    02/24/2021 Pathology Results   A. PERTIONEAL SCAR, EXCISION:  - Microscopic focus of high-grade serous carcinoma   B. 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 OMENTUM, RESECTION:  - Microscopic foci of high-grade serous carcinoma   03/14/2021 Tumor Marker   Patient's tumor was tested for the following markers: CA-125. Results of the tumor marker test revealed 32.7.   03/23/2021 Imaging   Unremarkable right upper quadrant ultrasound   04/01/2021 Genetic Testing   HRD status through [place] was negative.  The report date is 04/01/2021. Negative hereditary cancer genetic testing: no pathogenic variants detected in [employer] [facility].  The report date is 04/04/2021.  The [facility] gene panel offered by [employer] includes sequencing and deletion/duplication testing of the following 48 genes: APC, ATM, AXIN2, BAP1, BARD1, BMPR1A, BRCA1, BRCA2, BRIP1, CHD1, CDK4, CDKN2A(p16 and p14ARF), CHEK2, CTNNA1,  EGFR, EPCAM, FH, FLCN, GREM1, HOXB13, MEN1, MET, MITF , MLH1, MSH2, MSH3, MSH6, MUTYH, NTHL1, PALB2, PMS2, POLD1, POLE, PTEN, RAD51C, RAD51D, RET, SDHA, SDHB, SDHC, SDHD, SMAD4, STK11,TERT, TP53, TSC1, TSC2, and VHL.   04/12/2021 Tumor Marker   Patient's tumor was tested for the following markers: CA-125. Results of the tumor marker test revealed 12.7.   06/13/2021 Imaging   1. No new mass or lymphadenopathy identified in the abdomen or pelvis. Interval resolution of previous omental soft tissue caking. 2. Other ancillary findings.   06/13/2021 Tumor Marker   Patient's tumor was tested for the following markers: CA-125. Results of the tumor marker test revealed 10.1.   07/11/2021 Tumor Marker   Patient's tumor was tested for the following markers: CA-125. Results of the tumor marker test revealed 9.4.   09/08/2021 Imaging   1. Status post hysterectomy and omentectomy. 2. No evidence of lymphadenopathy or metastatic disease in the abdomen or pelvis.     09/08/2021 Tumor Marker   Patient's tumor was tested for the following markers: CA-125. Results of the tumor marker test revealed 9.0.   02/22/2022 Imaging   IMPRESSION: 1. No acute findings.  No ascites, mass or adenopathy. 2.  Aortic Atherosclerosis (ICD10-170.0).   03/10/2022 Tumor Marker   Patient's tumor was tested for the following markers: CA-125. Results of the tumor marker test revealed 11.7.   06/09/2022 Tumor Marker   Patient's tumor was tested for the following markers: CA-125. Results of the tumor marker test revealed 29.2.     Interval History: Patient reports overall doing well.  The right upper quadrant pain that she  was having had resolved but since her visit with Dr. Alvy Bimler, she has noted some pain that is right upper quadrant/epigastric.  This is intermittent, does not require her to take any medications.  Seems to happen most when she sits and she is somewhat hunched over or when she is laying on her right side.   Also has noticed some slight burning in her right pelvis occasionally.  Was not sure if this was due to some heavier lifting that she has done recently.  She denies any change to bowel or bladder function.  Denies any vaginal bleeding or discharge.  Reports baseline appetite, denies nausea or emesis.  Past Medical/Surgical History: Past Medical History:  Diagnosis Date   Anemia    Cancer ([place])    Family history of breast cancer 03/14/2021   Hole, retinal    Right eye   Osteoporosis    Polyp of colon, unspecified part of colon, unspecified type    Benign    Past Surgical History:  Procedure Laterality Date   APPENDECTOMY  02/27/2007   lsc   BILATERAL SALPINGECTOMY Right 03/1978   right ectopic, tied other tube   CATARACT EXTRACTION Right 10/31/2011   Dr. Katy Fitch   COLONOSCOPY  03/2019   Dr. Alessandra Bevels   COLONOSCOPY WITH PROPOFOL N/A 04/01/2018   Procedure: COLONOSCOPY WITH PROPOFOL;  Surgeon: Otis Brace, MD;  Location: [facility];  Service: Gastroenterology;  Laterality: N/A;   DILATION AND CURETTAGE OF UTERUS     x3, 2 for miscarriages, one for polyp vs fibroid   HAND / FINGER LESION EXCISION Left    lt. index finger   LAPAROSCOPY N/A 11/11/2020   Procedure: LAPAROSCOPY DIAGNOSTIC WITH PERITONEAL BIOPSY;  Surgeon: [name], MD;  Location: [facility];  Service: Gynecology;  Laterality: N/A;   POLYPECTOMY  04/01/2018   Procedure: POLYPECTOMY;  Surgeon: Otis Brace, MD;  Location: [facility];  Service: Gastroenterology;;   RETINAL TEAR REPAIR CRYOTHERAPY Right 04/25/2011   Dr. Dominic Pea INJECTION  04/01/2018   Procedure: SUBMUCOSAL INJECTION;  Surgeon: Otis Brace, MD;  Location: [facility];  Service: Gastroenterology;;   TUBAL LIGATION Left 03/1978    Family History  Problem Relation Age of Onset   Lung cancer Mother 16       smoking hx   Skin cancer Father        dx after 47; non-melanoma; scalp   Lung cancer Maternal Aunt         dx after 62; smoking hx   Breast cancer Daughter 34       ER+   Skin cancer Cousin        face; surgery only   Ovarian cancer Neg Hx    Endometrial cancer Neg Hx    Prostate cancer Neg Hx    Pancreatic cancer Neg Hx    Colon cancer Neg Hx     Social History   Socioeconomic History   Marital status: Legally Separated    Spouse name: Not on file   Number of children: Not on file   Years of education: Not on file   Highest education level: Not on file  Occupational History   Not on file  Tobacco Use   Smoking status: Never    Passive exposure: Past (18 years)   Smokeless tobacco: Never  Vaping Use   Vaping Use: Never used  Substance and Sexual Activity   Alcohol use: Not Currently   Drug use: Never   Sexual activity: Not Currently  Other Topics Concern   Not on file  Social History Narrative   Not on file   Social Determinants of Health   Financial Resource Strain: Not on file  Food Insecurity: Not on file  Transportation Needs: Not on file  Physical Activity: Not on file  Stress: Not on file  Social Connections: Not on file    Current Medications:  Current Outpatient Medications:    risedronate (ACTONEL) 150 MG tablet, Take 150 mg by mouth every 30 (thirty) days. with water on empty stomach, nothing by mouth or lie down for next 30 minutes., Disp: , Rfl:    tretinoin (RETIN-A) Q000111Q % cream, 1 application a pearl-sized amount to face in the evening Externally Once a day for 30 days, Disp: , Rfl:   Review of Systems: + The HPI for pertinent positives Denies appetite changes, fevers, chills, fatigue, unexplained weight changes. Denies hearing loss, neck lumps or masses, mouth sores, ringing in ears or voice changes. Denies cough or wheezing.  Denies shortness of breath. Denies chest pain or palpitations. Denies leg swelling. Denies abdominal distention, blood in stools, constipation, diarrhea, nausea, vomiting, or early satiety. Denies pain with intercourse,  dysuria, frequency, hematuria or incontinence. Denies hot flashes, vaginal bleeding or vaginal discharge.   Denies joint pain, back pain or muscle pain/cramps. Denies itching, rash, or wounds. Denies dizziness, headaches, numbness or seizures. Denies swollen lymph nodes or glands, denies easy bruising or bleeding. Denies anxiety, depression, confusion, or decreased concentration.  Physical Exam: BP (!) 138/51 (BP Location: Right Arm, Patient Position: Sitting)   Pulse 85   Temp 97.7 F (36.5 C) (Oral)   Resp 14   Wt 112 lb 6.4 oz (51 kg)   SpO2 100%   BMI 19.29 kg/m  General: Alert, oriented, no acute distress. HEENT: Normocephalic, atraumatic, sclera anicteric. Chest: Clear to auscultation bilaterally.  No wheezes or rhonchi.   Cardiovascular: Regular rate and rhythm, no murmurs. Abdomen: soft, nontender.  Normoactive bowel sounds.  No masses or hepatosplenomegaly appreciated.  Well-healed incisions.  I am able to induce mild tenderness with deep palpation in the epigastric region and right upper quadrant.  No discrete mass appreciated. Extremities: Grossly normal range of motion.  Warm, well perfused.  No edema bilaterally. Skin: No rashes or lesions noted. Lymphatics: No cervical, supraclavicular, or inguinal adenopathy. GU: Normal appearing external genitalia without erythema, excoriation, or lesions.  Speculum exam reveals mildly atrophic vaginal mucosa, no lesions noted.  Bimanual exam reveals cuff intact, no nodularity or masses.  Rectovaginal exam confirms findings.  Laboratory & Radiologic Studies: Component Ref Range & Units 2 d ago 3 mo ago 6 mo ago 9 mo ago 11 mo ago 12 mo ago 1 yr ago  Cancer Antigen (CA) 125 0.0 - 38.1 U/mL 29.2 11.7 CM 9.3 CM 9.0 CM 9.4 CM 10.1 CM 11.6 CM    Assessment & Plan: Robin Sellers is a 72 y.o. 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 Genetic testing was negative. CA-125 normal but more than doubled from 3 months ago.   Patient is overall doing well. I do not feel evidence of disease on her exam today.  We discussed the recent jump in her Ca125.  Given this as well as her right upper quadrant symptoms which have started again, I recommend that we get a CT scan.  I will call her with these  results.   Per NCCN surveillance recommendations, we will continue with visits every 3 months alternating between Dr. Alvy Bimler and myself.  Patient is scheduled to see Dr. Alvy Bimler in November.  She will return to see me in February.  Patient will get CA-125 at the time of her visits.  This was mildly elevated (in the 80s) at the time of her presentation.  We reviewed signs and symptoms that would be concerning for disease recurrence and I stressed the importance of calling if she develops any of these.  22 minutes of total time was spent for this patient encounter, including preparation, face-to-face counseling with the patient and coordination of care, and documentation of the encounter.  Jeral Pinch, MD  Division of Gynecologic Oncology  Department of Obstetrics and Gynecology  [facility]

## 2022-06-09 NOTE — Patient Instructions (Addendum)
It was good to see you today. I will let you know when I get your CT results.  I will see you for follow-up in 6 months.  As always, if you develop any new and concerning symptoms before your next visit, please call to see me sooner.

## 2022-06-22 ENCOUNTER — Ambulatory Visit (HOSPITAL_COMMUNITY)
Admission: RE | Admit: 2022-06-22 | Discharge: 2022-06-22 | Disposition: A | Discharge status: Home / Self Care | Payer: Medicare Other | Source: Ambulatory Visit | Attending: Gynecologic Oncology | Admitting: Gynecologic Oncology

## 2022-06-22 DIAGNOSIS — C482 Malignant neoplasm of peritoneum, unspecified: Secondary | ICD-10-CM | POA: Insufficient documentation

## 2022-06-22 MED ORDER — IOHEXOL 9 MG/ML PO SOLN
500.0000 mL | ORAL | Status: AC
  Administered 2022-06-22 (×2): 500 mL via ORAL

## 2022-06-22 MED ORDER — IOHEXOL 300 MG/ML  SOLN
100.0000 mL | Freq: Once | INTRAMUSCULAR | Status: AC | PRN
  Administered 2022-06-22: 80 mL via INTRAVENOUS

## 2022-06-22 MED ORDER — IOHEXOL 9 MG/ML PO SOLN
ORAL | Status: AC
  Filled 2022-06-22: qty 1000

## 2022-06-22 MED ORDER — SODIUM CHLORIDE (PF) 0.9 % IJ SOLN
INTRAMUSCULAR | Status: AC
  Filled 2022-06-22: qty 50

## 2022-06-23 ENCOUNTER — Encounter: Payer: Self-pay | Admitting: Hematology and Oncology

## 2022-06-23 ENCOUNTER — Inpatient Hospital Stay: Payer: Medicare Other | Attending: Hematology and Oncology | Admitting: Hematology and Oncology

## 2022-06-23 VITALS — BP 151/54 | HR 74 | Temp 98.6°F | Resp 18 | Ht 64.0 in | Wt 114.0 lb

## 2022-06-23 DIAGNOSIS — C482 Malignant neoplasm of peritoneum, unspecified: Secondary | ICD-10-CM | POA: Diagnosis not present

## 2022-06-23 DIAGNOSIS — Z8589 Personal history of malignant neoplasm of other organs and systems: Secondary | ICD-10-CM | POA: Insufficient documentation

## 2022-06-23 NOTE — Assessment & Plan Note (Signed)
I have reviewed her blood work and imaging study She has no evidence of disease The patient is reassured She will return here in 2 months for further follow-up and repeat tumor marker

## 2022-06-23 NOTE — Progress Notes (Signed)
[facility] OFFICE PROGRESS NOTE  Patient Care Team: Lorene Dy, MD as PCP - General (Internal Medicine) Awanda Mink Craige Cotta, RN as Oncology Nurse Navigator (Oncology)  ASSESSMENT & PLAN:  Primary peritoneal carcinomatosis [facility]) I have reviewed her blood work and imaging study She has no evidence of disease The patient is reassured She will return here in 2 months for further follow-up and repeat tumor marker  Orders Placed This Encounter  Procedures   CA 125    Standing Status:   Standing    Number of Occurrences:   11    Standing Expiration Date:   06/23/2023    All questions were answered. The patient knows to call the clinic with any problems, questions or concerns. The total time spent in the appointment was 20 minutes encounter with patients including review of chart and various tests results, discussions about plan of care and coordination of care plan   [name], MD 06/23/2022 3:11 PM  INTERVAL HISTORY: Please see below for problem oriented charting. she returns for review of test results CT imaging was performed due to elevated tumor marker She is not symptomatic  REVIEW OF SYSTEMS:   Constitutional: Denies fevers, chills or abnormal weight loss Eyes: Denies blurriness of vision Ears, nose, mouth, throat, and face: Denies mucositis or sore throat Respiratory: Denies cough, dyspnea or wheezes Cardiovascular: Denies palpitation, chest discomfort or lower extremity swelling Gastrointestinal:  Denies nausea, heartburn or change in bowel habits Skin: Denies abnormal skin rashes Lymphatics: Denies new lymphadenopathy or easy bruising Neurological:Denies numbness, tingling or new weaknesses Behavioral/Psych: Mood is stable, no new changes  All other systems were reviewed with the patient and are negative.  I have reviewed the past medical history, past surgical history, social history and family history with the patient and they are unchanged from previous  note.  ALLERGIES:  is allergic to artichoke [cynara scolymus (artichoke)].  MEDICATIONS:  Current Outpatient Medications  Medication Sig Dispense Refill   risedronate (ACTONEL) 150 MG tablet Take 150 mg by mouth every 30 (thirty) days. with water on empty stomach, nothing by mouth or lie down for next 30 minutes.     tretinoin (RETIN-A) [id number] % cream 1 application a pearl-sized amount to face in the evening Externally Once a day for 30 days     No current facility-administered medications for this visit.    SUMMARY OF ONCOLOGIC HISTORY: Oncology History Overview Note  Neg genetics   Primary peritoneal carcinomatosis ([place])  08/21/2020 Initial Diagnosis   The patient reports intermittent right upper quadrant pain that feel like she pulled a muscle beginning in March 2022.  In April, she woke up with sharp pain in her right upper quadrant.  This was her first episode of sharp pain.  She thought that her pain was related to her gallbladder and presented to the emergency department.  She was seen in ED on 4/30. RUQ ultrasound and x-ray were normal as were LFTs and lipase.    10/27/2020 Imaging   Findings highly suspicious for peritoneal carcinomatosis. No site of primary malignancy identified.   Colonic diverticulosis, without radiographic evidence of diverticulitis   11/02/2020 Initial Diagnosis   Primary peritoneal carcinomatosis ([place])   11/02/2020 Tumor Marker   Patient's tumor was tested for the following markers: CA-125. Results of the tumor marker test revealed 88.   11/11/2020 Pathology Results   FINAL MICROSCOPIC DIAGNOSIS:   A. CUL DE SAC, LEFT ANTERIOR, EXCISION:  -  Poorly differentiated carcinoma  -  See comment   B. SIDEWALL, RIGHT, BIOPSY:  -  Poorly differentiated carcinoma  -  See comment   COMMENT:   By immunohistochemistry, the neoplastic cells are positive for cytokeratin 7, p53, PAX8 and WT1 but negative for cytokeratin 20, GATA3, CDX2 and TTF-1.  Overall, the  immunophenotype is consistent with a  gynecologic primary.    11/11/2020 Pathology Results   FINAL MICROSCOPIC DIAGNOSIS:  - Malignant cells consistent with metastatic adenocarcinoma    11/11/2020 Surgery   Pre-operative Diagnosis: Carcinomatosis, mildly elevated CA-125   Post-operative Diagnosis: same, At least stage IIIC gyn malignancy   Operation: Diagnostic laparoscopy, peritoneal biopsies    Surgeon: [name] Operative Findings: On EUA, small mobile uterus. On intra-abdominal entry, carcinomatosis appreciated studding the right anterior diaphragm, bilateral liver surfaces, mesentery, pelvic peritoneum. Omental cake with measuring up to 2x3cm. Right ovary adherent to the right uterus with peritoneal studding adjacent. Frondular implants noted along right pelvic sidewall. Miliary disease along anterior cul de sac. Cul de sac covered with friable miliary disease. Small volume dark amber ascites. Specimens: left anterior cul de sac peritoneum, right pelvic sidewall peritoneal biopsy          11/15/2020 Cancer Staging   Staging form: Ovary, Fallopian Tube, and Primary Peritoneal Carcinoma, AJCC 8th Edition - Clinical stage from 11/15/2020: FIGO Stage III (cT3, cN0, cM0) - Signed by [name], MD on 11/15/2020 Stage prefix: Initial diagnosis   11/26/2020 - 05/13/2021 Chemotherapy   Patient is on Treatment Plan : OVARIAN Carboplatin (AUC 6) / Paclitaxel (175) q21d x 6 cycles     11/26/2020 Tumor Marker   Patient's tumor was tested for the following markers: CA-125. Results of the tumor marker test revealed 76.8.   12/17/2020 Tumor Marker   Patient's tumor was tested for the following markers: CA-125. Results of the tumor marker test revealed 58.1.   01/07/2021 Tumor Marker   Patient's tumor was tested for the following markers: CA-125. Results of the tumor marker test revealed 21.6.   01/20/2021 Imaging   Decreased omental soft tissue nodularity and caking, consistent with  interval improvement in carcinoma.   No evidence of new or progressive disease within the abdomen or pelvis.   Colonic diverticulosis. No radiographic evidence of diverticulitis.   Large stool burden noted; recommend clinical correlation for possible constipation   02/24/2021 Surgery   Robotic-assisted laparoscopic total hysterectomy with bilateral salpingoophorectomy, peritoneal stripping, mini-lap for omentectomy and intra-abdominal palpation  Findings: On EUA, small mobile uterus. Rectum free. On intra-abdominal entry, normal upper abdominal survey including liver edge, diaphragm and stomach. Omentum with several small (<2cm) areas of thickening, suspected treated tumor versus residual tumor. Uterus 6cm and normal in appearance. Atrophic appearing bilateral adnexa. Some adhesions of the left ovary to the medial leaf of the broad ligament. Minimal peritoneal nodularity versus treated disease within posterior cul de sac and deep left pelvis, all either excised or ablated. No significant peritoneal disease or carcinomatosis. No sigmoid or rectal involvement. Small bowel normal in appearance. No adenopathy. No ascites. R0 resection.    02/24/2021 Pathology Results   A. PERTIONEAL SCAR, EXCISION:  - Microscopic focus of high-grade serous carcinoma   B. 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 OMENTUM, RESECTION:  - Microscopic foci of high-grade serous  carcinoma   03/14/2021 Tumor Marker   Patient's tumor was tested for the following markers: CA-125. Results of the tumor marker test revealed 32.7.   03/23/2021 Imaging   Unremarkable right upper quadrant ultrasound   04/01/2021 Genetic Testing   HRD status through [facility] was negative.  The report date is  04/01/2021. Negative hereditary cancer genetic testing: no pathogenic variants detected in [facility].  The report date is 04/04/2021.  The [facility] gene panel offered by [employer] includes sequencing and deletion/duplication testing of the following 48 genes: APC, ATM, AXIN2, BAP1, BARD1, BMPR1A, BRCA1, BRCA2, BRIP1, CHD1, CDK4, CDKN2A(p16 and p14ARF), CHEK2, CTNNA1, EGFR, EPCAM, FH, FLCN, GREM1, HOXB13, MEN1, MET, MITF , MLH1, MSH2, MSH3, MSH6, MUTYH, NTHL1, PALB2, PMS2, POLD1, POLE, PTEN, RAD51C, RAD51D, RET, SDHA, SDHB, SDHC, SDHD, SMAD4, STK11,TERT, TP53, TSC1, TSC2, and VHL.   04/12/2021 Tumor Marker   Patient's tumor was tested for the following markers: CA-125. Results of the tumor marker test revealed 12.7.   06/13/2021 Imaging   1. No new mass or lymphadenopathy identified in the abdomen or pelvis. Interval resolution of previous omental soft tissue caking. 2. Other ancillary findings.   06/13/2021 Tumor Marker   Patient's tumor was tested for the following markers: CA-125. Results of the tumor marker test revealed 10.1.   07/11/2021 Tumor Marker   Patient's tumor was tested for the following markers: CA-125. Results of the tumor marker test revealed 9.4.   09/08/2021 Imaging   1. Status post hysterectomy and omentectomy. 2. No evidence of lymphadenopathy or metastatic disease in the abdomen or pelvis.     09/08/2021 Tumor Marker   Patient's tumor was tested for the following markers: CA-125. Results of the tumor marker test revealed 9.0.   02/22/2022 Imaging   IMPRESSION: 1. No acute findings.  No ascites, mass or adenopathy. 2.  Aortic Atherosclerosis (ICD10-170.0).   03/10/2022 Tumor Marker   Patient's tumor was tested for the following markers: CA-125. Results of the tumor marker test revealed 11.7.   06/09/2022 Tumor Marker   Patient's tumor was tested for the following markers: CA-125. Results of the tumor marker test revealed 29.2.     PHYSICAL  EXAMINATION: ECOG PERFORMANCE STATUS: 0 - Asymptomatic  Vitals:   06/23/22 1150  BP: (!) 151/54  Pulse: 74  Resp: 18  Temp: 98.6 F (37 C)  SpO2: 100%   Filed Weights   06/23/22 1150  Weight: 114 lb (51.7 kg)    GENERAL:alert, no distress and comfortable NEURO: alert & oriented x 3 with fluent speech, no focal motor/sensory deficits  LABORATORY DATA:  I have reviewed the data as listed    Component Value Date/Time   NA 137 06/07/2022 1258   K 4.2 06/07/2022 1258   CL 103 06/07/2022 1258   CO2 30 06/07/2022 1258   GLUCOSE 98 06/07/2022 1258   BUN 15 06/07/2022 1258   CREATININE 0.74 06/07/2022 1258   CREATININE 0.88 09/07/2021 0919   CALCIUM 9.5 06/07/2022 1258   PROT 7.1 06/07/2022 1258   ALBUMIN 4.6 06/07/2022 1258   AST 18 06/07/2022 1258   AST 20 09/07/2021 0919   ALT 15 06/07/2022 1258   ALT 15 09/07/2021 0919   ALKPHOS 40 06/07/2022 1258   BILITOT 0.5 06/07/2022 1258   BILITOT 0.7 09/07/2021 0919   GFRNONAA >60 06/07/2022 1258   GFRNONAA >60 09/07/2021 0919   GFRAA  02/27/2007 1510    >60        The eGFR has been  calculated using the MDRD equation. This calculation has not been validated in all clinical    No results found for: "SPEP", "UPEP"  Lab Results  Component Value Date   WBC 7.5 06/07/2022   NEUTROABS 5.4 06/07/2022   HGB 13.7 06/07/2022   HCT 39.7 06/07/2022   MCV 88.2 06/07/2022   PLT 174 06/07/2022      Chemistry      Component Value Date/Time   NA 137 06/07/2022 1258   K 4.2 06/07/2022 1258   CL 103 06/07/2022 1258   CO2 30 06/07/2022 1258   BUN 15 06/07/2022 1258   CREATININE 0.74 06/07/2022 1258   CREATININE 0.88 09/07/2021 0919      Component Value Date/Time   CALCIUM 9.5 06/07/2022 1258   ALKPHOS 40 06/07/2022 1258   AST 18 06/07/2022 1258   AST 20 09/07/2021 0919   ALT 15 06/07/2022 1258   ALT 15 09/07/2021 0919   BILITOT 0.5 06/07/2022 1258   BILITOT 0.7 09/07/2021 0919       RADIOGRAPHIC STUDIES: I have  personally reviewed the radiological images as listed and agreed with the findings in the report. CT Abdomen Pelvis W Contrast  Result Date: 06/23/2022 CLINICAL DATA:  Restaging ovarian carcinoma post chemotherapy EXAM: CT ABDOMEN AND PELVIS WITH CONTRAST TECHNIQUE: Multidetector CT imaging of the abdomen and pelvis was performed using the standard protocol following bolus administration of intravenous contrast. RADIATION DOSE REDUCTION: This exam was performed according to the departmental dose-optimization program which includes automated exposure control, adjustment of the mA and/or kV according to patient size and/or use of iterative reconstruction technique. CONTRAST:  38m OMNIPAQUE IOHEXOL 300 MG/ML  SOLN COMPARISON:  01/20/2022 and previous FINDINGS: Lower chest: No pleural or pericardial effusion. Visualized lung bases clear. Hepatobiliary: No focal liver abnormality is seen. No gallstones, gallbladder wall thickening, or biliary dilatation. Pancreas: Unremarkable. No pancreatic ductal dilatation or surrounding inflammatory changes. Spleen: Normal in size without focal abnormality. Small accessory splenule stable since 02/27/2007. Adrenals/Urinary Tract: Adrenal glands are unremarkable. Kidneys are normal, without renal calculi, focal lesion, or hydronephrosis. Bladder is unremarkable. Stomach/Bowel: Stomach is distended by ingested material, unremarkable. Small bowel decompressed with good distal passage of oral contrast material. Post appendectomy. Colon is partially distended by gas and fecal material, unremarkable. Vascular/Lymphatic: Minimal aortoiliac calcified atheromatous plaque without aneurysm or stenosis. Patent portal and renal veins. No abdominal or pelvic adenopathy. Reproductive: Status post hysterectomy. No adnexal masses. Other: Bilateral pelvic phleboliths. No ascites. No evident peritoneal disease. No free air. Musculoskeletal: Stable mild grade 1 anterolisthesis L4-5. Stable  degenerative disc disease L5-S1. Facet DJD L4-S1. No fracture or worrisome bone lesion. IMPRESSION: 1. No evidence of recurrent or metastatic disease. 2.  Aortic Atherosclerosis (ICD10-I70.0). Electronically Signed   By: DLucrezia EuropeM.D.   On: 06/23/2022 10:02

## 2022-08-31 IMAGING — CT CT ABD-PELV W/ CM
2 of 5 series · 15 of 46 positions shown, 17 images · IV contrast (APPLIED)
Comparison: 06/10/2021

CLINICAL DATA: Ovarian cancer, primary peritoneal carcinomatosis,
assess treatment response * Tracking Code: BO *

EXAM:
CT ABDOMEN AND PELVIS WITH CONTRAST
TECHNIQUE: Multidetector CT imaging of the abdomen and pelvis was performed
using the standard protocol following bolus administration of
intravenous contrast.

[Series 2: axial st · axial · 0.77mm/px · z∈[-659,-309]mm · 12 of 84 slices shown, 14 images]
[im 7/84  soft-tissue]
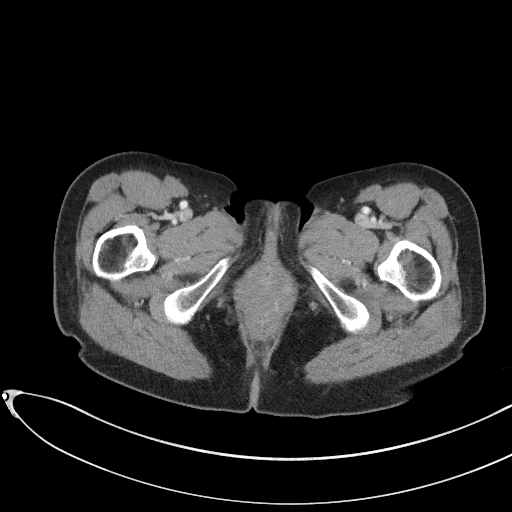
[im 7/84  bone]
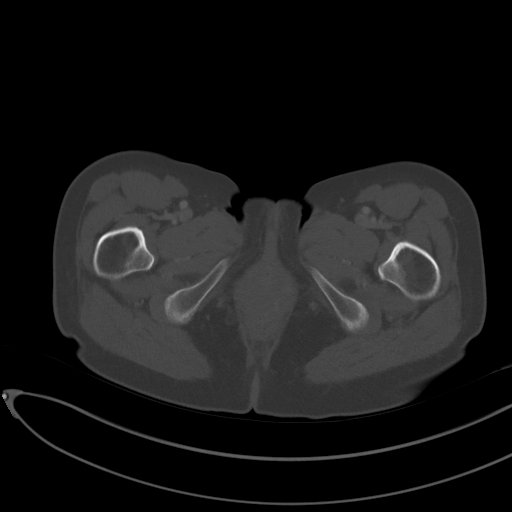
[im 13/84  soft-tissue]
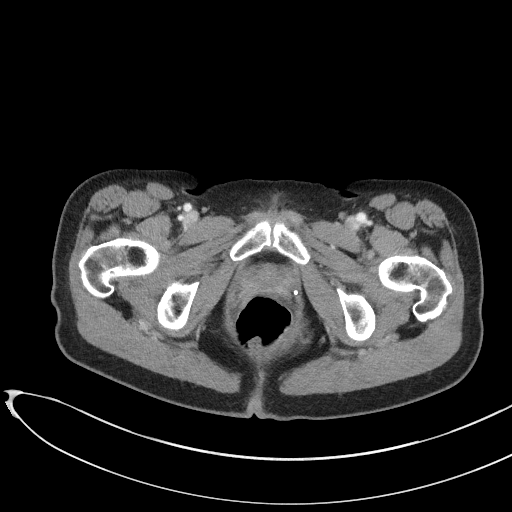
[im 20/84  soft-tissue]
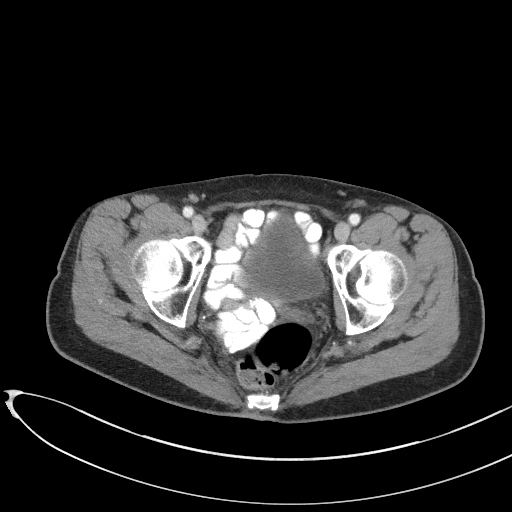
[im 26/84  soft-tissue]
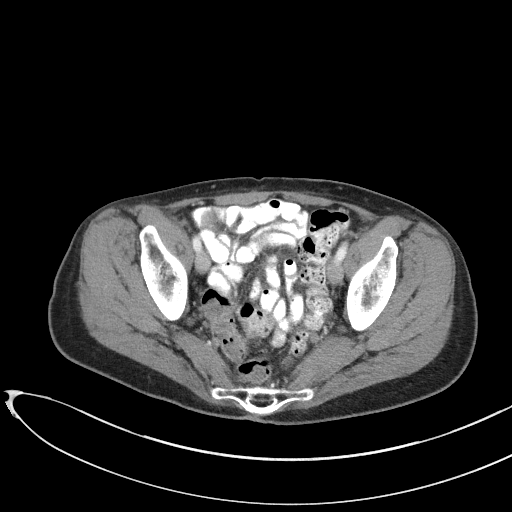
[im 32/84  soft-tissue]
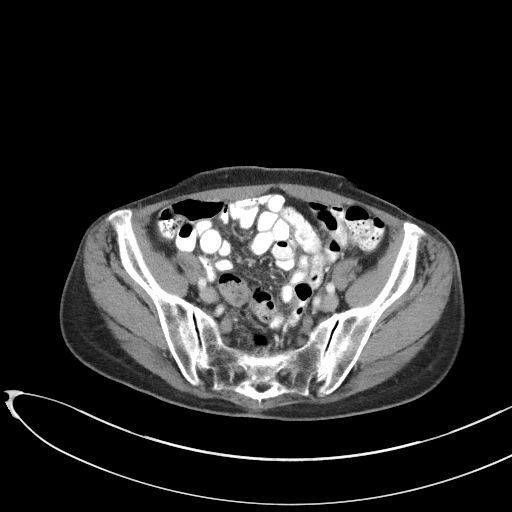
[im 39/84  soft-tissue]
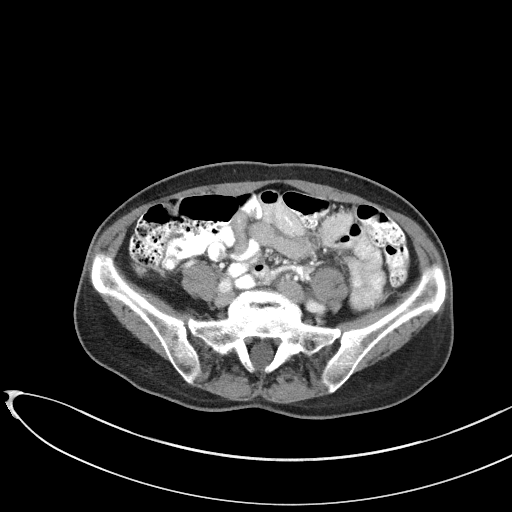
[im 45/84  soft-tissue]
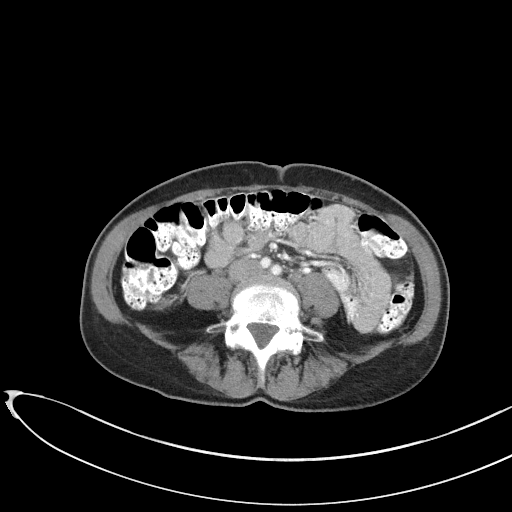
[im 52/84  soft-tissue]
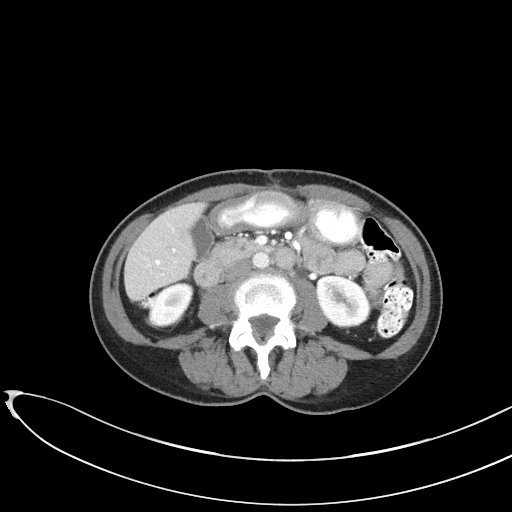
[im 58/84  soft-tissue]
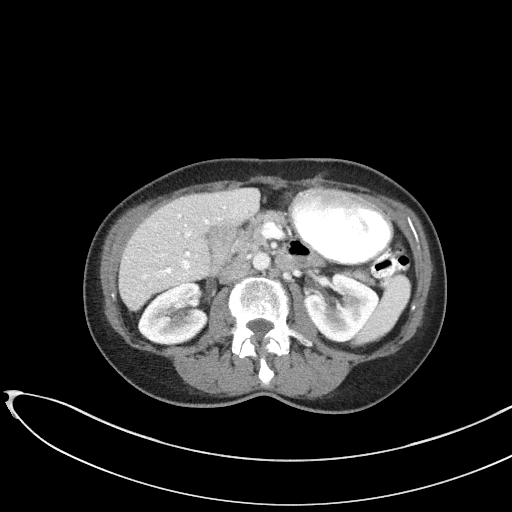
[im 58/84  bone]
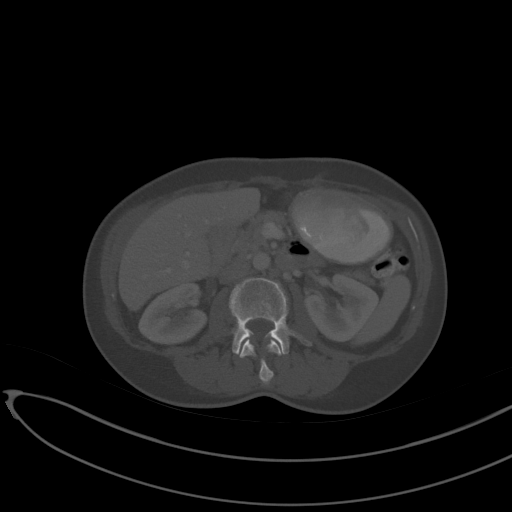
[im 64/84  soft-tissue]
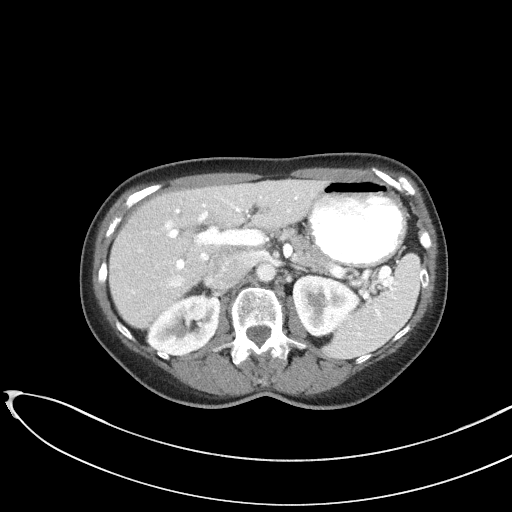
[im 71/84  soft-tissue]
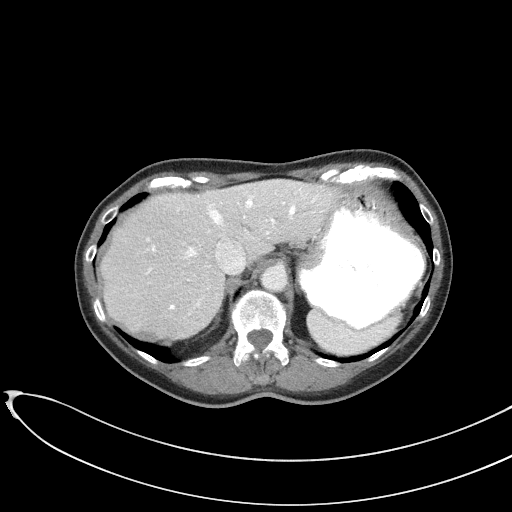
[im 77/84  soft-tissue]
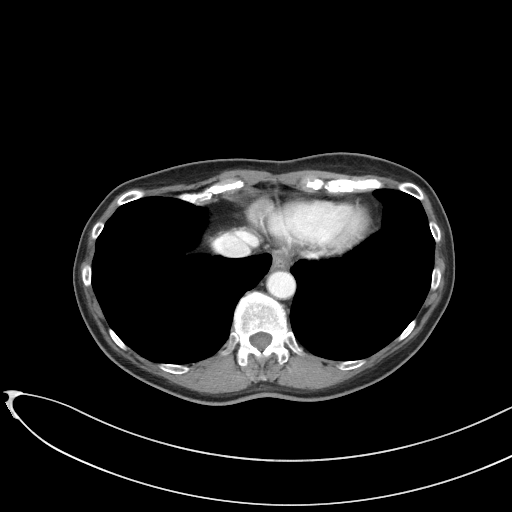

[Series 5: coronal st · coronal · 0.73mm/px · 3 of 86 slices shown]
[im 29/86  soft-tissue]
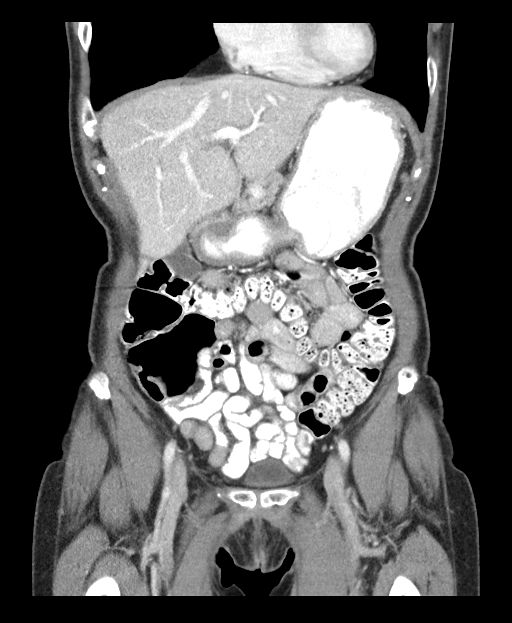
[im 38/86  soft-tissue]
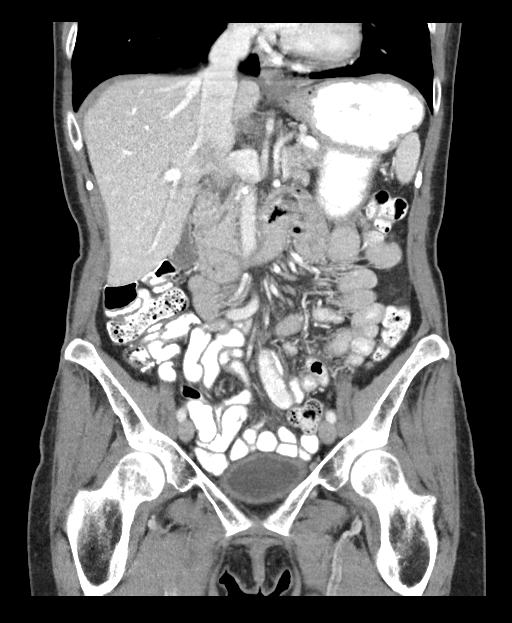
[im 48/86  soft-tissue]
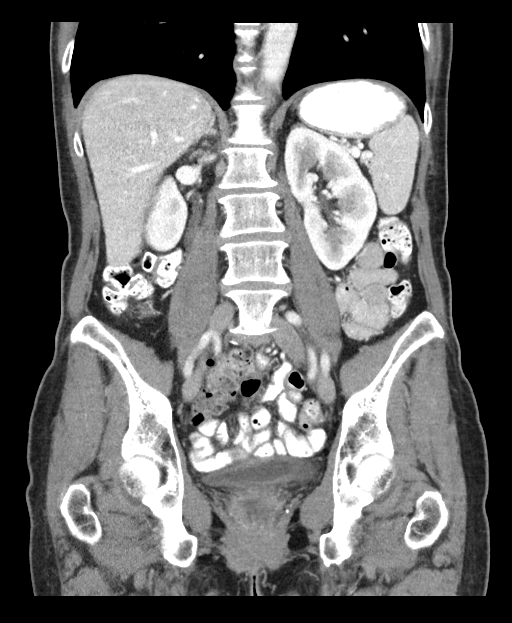

[15 of 46 positions shown; findings below may reference images not displayed]

RADIATION DOSE REDUCTION: This exam was performed according to the
departmental dose-optimization program which includes automated
exposure control, adjustment of the mA and/or kV according to
patient size and/or use of iterative reconstruction technique.

CONTRAST:  100mL OMNIPAQUE IOHEXOL 300 MG/ML SOLN, additional oral
enteric contrast
FINDINGS: Lower chest: No acute abnormality.

Hepatobiliary: No solid liver abnormality is seen. No gallstones,
gallbladder wall thickening, or biliary dilatation.

Pancreas: Unremarkable. No pancreatic ductal dilatation or
surrounding inflammatory changes.

Spleen: Normal in size without significant abnormality.

Adrenals/Urinary Tract: Adrenal glands are unremarkable. Kidneys are
normal, without renal calculi, solid lesion, or hydronephrosis.
Bladder is unremarkable.

Stomach/Bowel: Stomach is within normal limits. Appendix is
surgically absent. No evidence of bowel wall thickening, distention,
or inflammatory changes. Sigmoid diverticula. Status post
omentectomy.

Vascular/Lymphatic: No significant vascular findings are present. No
enlarged abdominal or pelvic lymph nodes.

Reproductive: Status post hysterectomy.

Other: No abdominal wall hernia or abnormality. No ascites.

Musculoskeletal: No acute or significant osseous findings.
IMPRESSION: 1. Status post hysterectomy and omentectomy.
2. No evidence of lymphadenopathy or metastatic disease in the
abdomen or pelvis.

## 2022-09-05 ENCOUNTER — Inpatient Hospital Stay: Payer: Medicare Other | Attending: Hematology and Oncology

## 2022-09-05 DIAGNOSIS — R971 Elevated cancer antigen 125 [CA 125]: Secondary | ICD-10-CM | POA: Diagnosis not present

## 2022-09-05 DIAGNOSIS — Z90710 Acquired absence of both cervix and uterus: Secondary | ICD-10-CM | POA: Diagnosis not present

## 2022-09-05 DIAGNOSIS — Z8589 Personal history of malignant neoplasm of other organs and systems: Secondary | ICD-10-CM | POA: Diagnosis present

## 2022-09-05 DIAGNOSIS — Z9079 Acquired absence of other genital organ(s): Secondary | ICD-10-CM | POA: Insufficient documentation

## 2022-09-05 DIAGNOSIS — C482 Malignant neoplasm of peritoneum, unspecified: Secondary | ICD-10-CM

## 2022-09-05 DIAGNOSIS — Z90722 Acquired absence of ovaries, bilateral: Secondary | ICD-10-CM | POA: Diagnosis not present

## 2022-09-05 LAB — CBC WITH DIFFERENTIAL/PLATELET
Abs Immature Granulocytes: 0.02 10*3/uL (ref 0.00–0.07)
Basophils Absolute: 0 10*3/uL (ref 0.0–0.1)
Basophils Relative: 1 %
Eosinophils Absolute: 0.1 10*3/uL (ref 0.0–0.5)
Eosinophils Relative: 2 %
HCT: 41.1 % (ref 36.0–46.0)
Hemoglobin: 13.8 g/dL (ref 12.0–15.0)
Immature Granulocytes: 0 %
Lymphocytes Relative: 29 %
Lymphs Abs: 1.8 10*3/uL (ref 0.7–4.0)
MCH: 30.2 pg (ref 26.0–34.0)
MCHC: 33.6 g/dL (ref 30.0–36.0)
MCV: 89.9 fL (ref 80.0–100.0)
Monocytes Absolute: 0.4 10*3/uL (ref 0.1–1.0)
Monocytes Relative: 6 %
Neutro Abs: 3.9 10*3/uL (ref 1.7–7.7)
Neutrophils Relative %: 62 %
Platelets: 167 10*3/uL (ref 150–400)
RBC: 4.57 MIL/uL (ref 3.87–5.11)
RDW: 12.3 % (ref 11.5–15.5)
WBC: 6.2 10*3/uL (ref 4.0–10.5)
nRBC: 0 % (ref 0.0–0.2)

## 2022-09-05 LAB — COMPREHENSIVE METABOLIC PANEL
ALT: 14 U/L (ref 0–44)
AST: 17 U/L (ref 15–41)
Albumin: 4.6 g/dL (ref 3.5–5.0)
Alkaline Phosphatase: 35 U/L — ABNORMAL LOW (ref 38–126)
Anion gap: 5 (ref 5–15)
BUN: 25 mg/dL — ABNORMAL HIGH (ref 8–23)
CO2: 29 mmol/L (ref 22–32)
Calcium: 9.1 mg/dL (ref 8.9–10.3)
Chloride: 105 mmol/L (ref 98–111)
Creatinine, Ser: 0.84 mg/dL (ref 0.44–1.00)
GFR, Estimated: >60 mL/min (ref >60)
Glucose, Bld: 102 mg/dL — ABNORMAL HIGH (ref 70–99)
Potassium: 4.3 mmol/L (ref 3.5–5.1)
Sodium: 139 mmol/L (ref 135–145)
Total Bilirubin: 0.5 mg/dL (ref 0.3–1.2)
Total Protein: 7.1 g/dL (ref 6.5–8.1)

## 2022-09-07 ENCOUNTER — Inpatient Hospital Stay (HOSPITAL_BASED_OUTPATIENT_CLINIC_OR_DEPARTMENT_OTHER): Payer: Medicare Other | Admitting: Hematology and Oncology

## 2022-09-07 ENCOUNTER — Encounter: Payer: Self-pay | Admitting: Hematology and Oncology

## 2022-09-07 ENCOUNTER — Telehealth: Payer: Self-pay

## 2022-09-07 VITALS — BP 141/65 | HR 75 | Temp 97.4°F | Resp 18 | Ht 64.0 in | Wt 114.4 lb

## 2022-09-07 DIAGNOSIS — C482 Malignant neoplasm of peritoneum, unspecified: Secondary | ICD-10-CM

## 2022-09-07 DIAGNOSIS — Z8589 Personal history of malignant neoplasm of other organs and systems: Secondary | ICD-10-CM | POA: Diagnosis not present

## 2022-09-07 LAB — CA 125: Cancer Antigen (CA) 125: 39.7 U/mL — ABNORMAL HIGH (ref 0.0–38.1)

## 2022-09-07 NOTE — Telephone Encounter (Signed)
-----   Message from Artis Delay, MD sent at 09/07/2022 10:46 AM EDT ----- Can you schedule appt to see me back on 5/28 at 2 pm to discuss CT result? 45 mins

## 2022-09-07 NOTE — Telephone Encounter (Signed)
Called and scheduled appt on 5/28. She is aware of appt date/ time.

## 2022-09-07 NOTE — Progress Notes (Signed)
[facility] OFFICE PROGRESS NOTE  Patient Care Team: Eduardo Osier, RN as Oncology Nurse Navigator (Oncology)  ASSESSMENT & PLAN:  Primary peritoneal carcinomatosis [facility]) Her exam is benign and she is not symptomatic However, given elevated tumor marker, I recommend we proceed with CT imaging to rule out cancer recurrence and she is in agreement I will see her back after imaging study is done to discuss test results  Orders Placed This Encounter  Procedures   CT ABDOMEN PELVIS W CONTRAST    Standing Status:   Future    Standing Expiration Date:   09/07/2023    Scheduling Instructions:     No PO contrast    Order Specific Question:   If indicated for the ordered procedure, I authorize the administration of contrast media per Radiology protocol    Answer:   Yes    Order Specific Question:   Does the patient have a contrast media/X-ray dye allergy?    Answer:   No    Order Specific Question:   Preferred imaging location?    Answer:   [facility]    Order Specific Question:   If indicated for the ordered procedure, I authorize the administration of oral contrast media per Radiology protocol    Answer:   Yes    All questions were answered. The patient knows to call the clinic with any problems, questions or concerns. The total time spent in the appointment was 30 minutes encounter with patients including review of chart and various tests results, discussions about plan of care and coordination of care plan   Artis Delay, MD 09/07/2022 10:45 AM  INTERVAL HISTORY: Please see below for problem oriented charting. she returns for surveillance follow-up We reviewed recent test results which show elevated tumor marker She is not symptomatic Denies bloating, abdominal pain or discomfort No recent changes in bowel habits  REVIEW OF SYSTEMS:   Constitutional: Denies fevers, chills or abnormal weight loss Eyes: Denies blurriness of vision Ears, nose, mouth, throat,  and face: Denies mucositis or sore throat Respiratory: Denies cough, dyspnea or wheezes Cardiovascular: Denies palpitation, chest discomfort or lower extremity swelling Gastrointestinal:  Denies nausea, heartburn or change in bowel habits Skin: Denies abnormal skin rashes Lymphatics: Denies new lymphadenopathy or easy bruising Neurological:Denies numbness, tingling or new weaknesses Behavioral/Psych: Mood is stable, no new changes  All other systems were reviewed with the patient and are negative.  I have reviewed the past medical history, past surgical history, social history and family history with the patient and they are unchanged from previous note.  ALLERGIES:  is allergic to artichoke [cynara scolymus (artichoke)].  MEDICATIONS:  Current Outpatient Medications  Medication Sig Dispense Refill   risedronate (ACTONEL) 150 MG tablet Take 150 mg by mouth every 30 (thirty) days. with water on empty stomach, nothing by mouth or lie down for next 30 minutes.     tretinoin (RETIN-A) 0.025 % cream 1 application a pearl-sized amount to face in the evening Externally Once a day for 30 days     No current facility-administered medications for this visit.    SUMMARY OF ONCOLOGIC HISTORY: Oncology History Overview Note  Neg genetics   Primary peritoneal carcinomatosis (HCC)  08/21/2020 Initial Diagnosis   The patient reports intermittent right upper quadrant pain that feel like she pulled a muscle beginning in March 2022.  In April, she woke up with sharp pain in her right upper quadrant.  This was her first episode of sharp pain.  She thought that her pain was related to her gallbladder and presented to the emergency department.  She was seen in ED on 4/30. RUQ ultrasound and x-ray were normal as were LFTs and lipase.    10/27/2020 Imaging   Findings highly suspicious for peritoneal carcinomatosis. No site of primary malignancy identified.   Colonic diverticulosis, without radiographic  evidence of diverticulitis   11/02/2020 Initial Diagnosis   Primary peritoneal carcinomatosis (HCC)   11/02/2020 Tumor Marker   Patient's tumor was tested for the following markers: CA-125. Results of the tumor marker test revealed 88.   11/11/2020 Pathology Results   FINAL MICROSCOPIC DIAGNOSIS:   A. CUL DE SAC, LEFT ANTERIOR, EXCISION:  -  Poorly differentiated carcinoma  -  See comment   B. SIDEWALL, RIGHT, BIOPSY:  -  Poorly differentiated carcinoma  -  See comment   COMMENT:   By immunohistochemistry, the neoplastic cells are positive for cytokeratin 7, p53, PAX8 and WT1 but negative for cytokeratin 20, GATA3, CDX2 and TTF-1.  Overall, the immunophenotype is consistent with a  gynecologic primary.    11/11/2020 Pathology Results   FINAL MICROSCOPIC DIAGNOSIS:  - Malignant cells consistent with metastatic adenocarcinoma    11/11/2020 Surgery   Pre-operative Diagnosis: Carcinomatosis, mildly elevated CA-125   Post-operative Diagnosis: same, At least stage IIIC gyn malignancy   Operation: Diagnostic laparoscopy, peritoneal biopsies    Surgeon: Eugene Garnet MD Operative Findings: On EUA, small mobile uterus. On intra-abdominal entry, carcinomatosis appreciated studding the right anterior diaphragm, bilateral liver surfaces, mesentery, pelvic peritoneum. Omental cake with measuring up to 2x3cm. Right ovary adherent to the right uterus with peritoneal studding adjacent. Frondular implants noted along right pelvic sidewall. Miliary disease along anterior cul de sac. Cul de sac covered with friable miliary disease. Small volume dark amber ascites. Specimens: left anterior cul de sac peritoneum, right pelvic sidewall peritoneal biopsy          11/15/2020 Cancer Staging   Staging form: Ovary, Fallopian Tube, and Primary Peritoneal Carcinoma, AJCC 8th Edition - Clinical stage from 11/15/2020: FIGO Stage III (cT3, cN0, cM0) - Signed by Artis Delay, MD on 11/15/2020 Stage prefix:  Initial diagnosis   11/26/2020 - 05/13/2021 Chemotherapy   Patient is on Treatment Plan : OVARIAN Carboplatin (AUC 6) / Paclitaxel (175) q21d x 6 cycles     11/26/2020 Tumor Marker   Patient's tumor was tested for the following markers: CA-125. Results of the tumor marker test revealed 76.8.   12/17/2020 Tumor Marker   Patient's tumor was tested for the following markers: CA-125. Results of the tumor marker test revealed 58.1.   01/07/2021 Tumor Marker   Patient's tumor was tested for the following markers: CA-125. Results of the tumor marker test revealed 21.6.   01/20/2021 Imaging   Decreased omental soft tissue nodularity and caking, consistent with interval improvement in carcinoma.   No evidence of new or progressive disease within the abdomen or pelvis.   Colonic diverticulosis. No radiographic evidence of diverticulitis.   Large stool burden noted; recommend clinical correlation for possible constipation   02/24/2021 Surgery   Robotic-assisted laparoscopic total hysterectomy with bilateral salpingoophorectomy, peritoneal stripping, mini-lap for omentectomy and intra-abdominal palpation  Findings: On EUA, small mobile uterus. Rectum free. On intra-abdominal entry, normal upper abdominal survey including liver edge, diaphragm and stomach. Omentum with several small (<2cm) areas of thickening, suspected treated tumor versus residual tumor. Uterus 6cm and normal in appearance. Atrophic appearing bilateral adnexa. Some adhesions of the left ovary to the  medial leaf of the broad ligament. Minimal peritoneal nodularity versus treated disease within posterior cul de sac and deep left pelvis, all either excised or ablated. No significant peritoneal disease or carcinomatosis. No sigmoid or rectal involvement. Small bowel normal in appearance. No adenopathy. No ascites. R0 resection.    02/24/2021 Pathology Results   A. PERTIONEAL SCAR, EXCISION:  - Microscopic focus of high-grade serous carcinoma    B. 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 OMENTUM, RESECTION:  - Microscopic foci of high-grade serous carcinoma   03/14/2021 Tumor Marker   Patient's tumor was tested for the following markers: CA-125. Results of the tumor marker test revealed 32.7.   03/23/2021 Imaging   Unremarkable right upper quadrant ultrasound   04/01/2021 Genetic Testing   HRD status through [employer] was negative.  The report date is 04/01/2021. Negative hereditary cancer genetic testing: no pathogenic variants detected in [employer].  The report date is 04/04/2021.  The [facility] gene panel offered by [employer] includes sequencing and deletion/duplication testing of the following 48 genes: APC, ATM, AXIN2, BAP1, BARD1, BMPR1A, BRCA1, BRCA2, BRIP1, CHD1, CDK4, CDKN2A(p16 and p14ARF), CHEK2, CTNNA1, EGFR, EPCAM, FH, FLCN, GREM1, HOXB13, MEN1, MET, MITF , MLH1, MSH2, MSH3, MSH6, MUTYH, NTHL1, PALB2, PMS2, POLD1, POLE, PTEN, RAD51C, RAD51D, RET, SDHA, SDHB, SDHC, SDHD, SMAD4, STK11,TERT, TP53, TSC1, TSC2, and VHL.   04/12/2021 Tumor Marker   Patient's tumor was tested for the following markers: CA-125. Results of the tumor marker test revealed 12.7.   06/13/2021 Imaging   1. No new mass or lymphadenopathy identified in the abdomen or pelvis. Interval resolution of previous omental soft tissue caking. 2. Other ancillary findings.   06/13/2021 Tumor Marker   Patient's tumor was tested for the following markers: CA-125. Results of the tumor marker test revealed 10.1.   07/11/2021 Tumor Marker   Patient's tumor was tested for the following markers: CA-125. Results of the tumor marker test revealed 9.4.   09/08/2021 Imaging   1. Status post hysterectomy and  omentectomy. 2. No evidence of lymphadenopathy or metastatic disease in the abdomen or pelvis.     09/08/2021 Tumor Marker   Patient's tumor was tested for the following markers: CA-125. Results of the tumor marker test revealed 9.0.   02/22/2022 Imaging   IMPRESSION: 1. No acute findings.  No ascites, mass or adenopathy. 2.  Aortic Atherosclerosis (ICD10-170.0).   03/10/2022 Tumor Marker   Patient's tumor was tested for the following markers: CA-125. Results of the tumor marker test revealed 11.7.   06/09/2022 Tumor Marker   Patient's tumor was tested for the following markers: CA-125. Results of the tumor marker test revealed 29.2.   09/07/2022 Tumor Marker   Patient's tumor was tested for the following markers: CA-125. Results of the tumor marker test revealed 39.7.     PHYSICAL EXAMINATION: ECOG PERFORMANCE STATUS: 0 - Asymptomatic  Vitals:   09/07/22 0857  BP: (!) 141/65  Pulse: 75  Resp: 18  Temp: (!) 97.4 F (36.3 C)  SpO2: 100%   Filed Weights   09/07/22 0857  Weight: 114 lb 6.4 oz (51.9 kg)    GENERAL:alert, no distress and comfortable SKIN: skin color, texture, turgor are normal, no rashes or significant lesions EYES: normal, Conjunctiva are pink and  non-injected, sclera clear OROPHARYNX:no exudate, no erythema and lips, buccal mucosa, and tongue normal  NECK: supple, thyroid normal size, non-tender, without nodularity LYMPH:  no palpable lymphadenopathy in the cervical, axillary or inguinal LUNGS: clear to auscultation and percussion with normal breathing effort HEART: regular rate & rhythm and no murmurs and no lower extremity edema ABDOMEN:abdomen soft, non-tender and normal bowel sounds Musculoskeletal:no cyanosis of digits and no clubbing  NEURO: alert & oriented x 3 with fluent speech, no focal motor/sensory deficits  LABORATORY DATA:  I have reviewed the data as listed    Component Value Date/Time   NA 139 09/05/2022 0923   K 4.3 09/05/2022  0923   CL 105 09/05/2022 0923   CO2 29 09/05/2022 0923   GLUCOSE 102 (H) 09/05/2022 0923   BUN 25 (H) 09/05/2022 0923   CREATININE 0.84 09/05/2022 0923   CREATININE 0.88 09/07/2021 0919   CALCIUM 9.1 09/05/2022 0923   PROT 7.1 09/05/2022 0923   ALBUMIN 4.6 09/05/2022 0923   AST 17 09/05/2022 0923   AST 20 09/07/2021 0919   ALT 14 09/05/2022 0923   ALT 15 09/07/2021 0919   ALKPHOS 35 (L) 09/05/2022 0923   BILITOT 0.5 09/05/2022 0923   BILITOT 0.7 09/07/2021 0919   GFRNONAA >60 09/05/2022 0923   GFRNONAA >60 09/07/2021 0919   GFRAA  02/27/2007 1510    >60        The eGFR has been calculated using the MDRD equation. This calculation has not been validated in all clinical    No results found for: "SPEP", "UPEP"  Lab Results  Component Value Date   WBC 6.2 09/05/2022   NEUTROABS 3.9 09/05/2022   HGB 13.8 09/05/2022   HCT 41.1 09/05/2022   MCV 89.9 09/05/2022   PLT 167 09/05/2022      Chemistry      Component Value Date/Time   NA 139 09/05/2022 0923   K 4.3 09/05/2022 0923   CL 105 09/05/2022 0923   CO2 29 09/05/2022 0923   BUN 25 (H) 09/05/2022 0923   CREATININE 0.84 09/05/2022 0923   CREATININE 0.88 09/07/2021 0919      Component Value Date/Time   CALCIUM 9.1 09/05/2022 0923   ALKPHOS 35 (L) 09/05/2022 0923   AST 17 09/05/2022 0923   AST 20 09/07/2021 0919   ALT 14 09/05/2022 0923   ALT 15 09/07/2021 0919   BILITOT 0.5 09/05/2022 0923   BILITOT 0.7 09/07/2021 0919

## 2022-09-07 NOTE — Assessment & Plan Note (Signed)
Her exam is benign and she is not symptomatic However, given elevated tumor marker, I recommend we proceed with CT imaging to rule out cancer recurrence and she is in agreement I will see her back after imaging study is done to discuss test results

## 2022-09-15 ENCOUNTER — Ambulatory Visit (HOSPITAL_COMMUNITY)
Admission: RE | Admit: 2022-09-15 | Discharge: 2022-09-15 | Disposition: A | Discharge status: Home / Self Care | Payer: Medicare Other | Source: Ambulatory Visit | Attending: Hematology and Oncology | Admitting: Hematology and Oncology

## 2022-09-15 DIAGNOSIS — C482 Malignant neoplasm of peritoneum, unspecified: Secondary | ICD-10-CM | POA: Diagnosis present

## 2022-09-15 MED ORDER — IOHEXOL 350 MG/ML SOLN
60.0000 mL | Freq: Once | INTRAVENOUS | Status: AC | PRN
  Administered 2022-09-15: 60 mL via INTRAVENOUS

## 2022-09-19 ENCOUNTER — Inpatient Hospital Stay (HOSPITAL_BASED_OUTPATIENT_CLINIC_OR_DEPARTMENT_OTHER): Payer: Medicare Other | Admitting: Hematology and Oncology

## 2022-09-19 ENCOUNTER — Encounter: Payer: Self-pay | Admitting: Hematology and Oncology

## 2022-09-19 VITALS — BP 144/64 | HR 85 | Temp 97.7°F | Resp 18 | Ht 64.0 in | Wt 114.8 lb

## 2022-09-19 DIAGNOSIS — Z8589 Personal history of malignant neoplasm of other organs and systems: Secondary | ICD-10-CM | POA: Diagnosis not present

## 2022-09-19 DIAGNOSIS — C482 Malignant neoplasm of peritoneum, unspecified: Secondary | ICD-10-CM | POA: Diagnosis not present

## 2022-09-19 NOTE — Assessment & Plan Note (Signed)
I reviewed multiple imaging studies with the patient I do not see any evidence of cancer recurrence We discussed limitations of tumor marker monitoring The patient is educated to watch out for signs and symptoms of cancer recurrence I plan to repeat blood work again in 2 months and we will call her with test results

## 2022-09-19 NOTE — Progress Notes (Signed)
[facility] OFFICE PROGRESS NOTE  Patient Care Team: Pcp, No as PCP - General Eduardo Osier, RN as Oncology Nurse Navigator (Oncology)  ASSESSMENT & PLAN:  Primary peritoneal carcinomatosis [facility]) I reviewed multiple imaging studies with the patient I do not see any evidence of cancer recurrence We discussed limitations of tumor marker monitoring The patient is educated to watch out for signs and symptoms of cancer recurrence I plan to repeat blood work again in 2 months and we will call her with test results  No orders of the defined types were placed in this encounter.   All questions were answered. The patient knows to call the clinic with any problems, questions or concerns. The total time spent in the appointment was 30 minutes encounter with patients including review of chart and various tests results, discussions about plan of care and coordination of care plan   Artis Delay, MD 09/19/2022 2:13 PM  INTERVAL HISTORY: Please see below for problem oriented charting. she returns for review of test results with her best friend We spent majority of the time reviewing results of her recent blood work and imaging studies  REVIEW OF SYSTEMS:   Constitutional: Denies fevers, chills or abnormal weight loss Eyes: Denies blurriness of vision Ears, nose, mouth, throat, and face: Denies mucositis or sore throat Respiratory: Denies cough, dyspnea or wheezes Cardiovascular: Denies palpitation, chest discomfort or lower extremity swelling Gastrointestinal:  Denies nausea, heartburn or change in bowel habits Skin: Denies abnormal skin rashes Lymphatics: Denies new lymphadenopathy or easy bruising Neurological:Denies numbness, tingling or new weaknesses Behavioral/Psych: Mood is stable, no new changes  All other systems were reviewed with the patient and are negative.  I have reviewed the past medical history, past surgical history, social history and family history with the  patient and they are unchanged from previous note.  ALLERGIES:  is allergic to artichoke [cynara scolymus (artichoke)].  MEDICATIONS:  Current Outpatient Medications  Medication Sig Dispense Refill   risedronate (ACTONEL) 150 MG tablet Take 150 mg by mouth every 30 (thirty) days. with water on empty stomach, nothing by mouth or lie down for next 30 minutes.     tretinoin (RETIN-A) 0.025 % cream 1 application a pearl-sized amount to face in the evening Externally Once a day for 30 days     No current facility-administered medications for this visit.    SUMMARY OF ONCOLOGIC HISTORY: Oncology History Overview Note  Neg genetics   Primary peritoneal carcinomatosis (HCC)  08/21/2020 Initial Diagnosis   The patient reports intermittent right upper quadrant pain that feel like she pulled a muscle beginning in March 2022.  In April, she woke up with sharp pain in her right upper quadrant.  This was her first episode of sharp pain.  She thought that her pain was related to her gallbladder and presented to the emergency department.  She was seen in ED on 4/30. RUQ ultrasound and x-ray were normal as were LFTs and lipase.    10/27/2020 Imaging   Findings highly suspicious for peritoneal carcinomatosis. No site of primary malignancy identified.   Colonic diverticulosis, without radiographic evidence of diverticulitis   11/02/2020 Initial Diagnosis   Primary peritoneal carcinomatosis (HCC)   11/02/2020 Tumor Marker   Patient's tumor was tested for the following markers: CA-125. Results of the tumor marker test revealed 88.   11/11/2020 Pathology Results   FINAL MICROSCOPIC DIAGNOSIS:   A. CUL DE SAC, LEFT ANTERIOR, EXCISION:  -  Poorly differentiated carcinoma  -  See comment   B. SIDEWALL, RIGHT, BIOPSY:  -  Poorly differentiated carcinoma  -  See comment   COMMENT:   By immunohistochemistry, the neoplastic cells are positive for cytokeratin 7, p53, PAX8 and WT1 but negative for  cytokeratin 20, GATA3, CDX2 and TTF-1.  Overall, the immunophenotype is consistent with a  gynecologic primary.    11/11/2020 Pathology Results   FINAL MICROSCOPIC DIAGNOSIS:  - Malignant cells consistent with metastatic adenocarcinoma    11/11/2020 Surgery   Pre-operative Diagnosis: Carcinomatosis, mildly elevated CA-125   Post-operative Diagnosis: same, At least stage IIIC gyn malignancy   Operation: Diagnostic laparoscopy, peritoneal biopsies    Surgeon: Eugene Garnet MD Operative Findings: On EUA, small mobile uterus. On intra-abdominal entry, carcinomatosis appreciated studding the right anterior diaphragm, bilateral liver surfaces, mesentery, pelvic peritoneum. Omental cake with measuring up to 2x3cm. Right ovary adherent to the right uterus with peritoneal studding adjacent. Frondular implants noted along right pelvic sidewall. Miliary disease along anterior cul de sac. Cul de sac covered with friable miliary disease. Small volume dark amber ascites. Specimens: left anterior cul de sac peritoneum, right pelvic sidewall peritoneal biopsy          11/15/2020 Cancer Staging   Staging form: Ovary, Fallopian Tube, and Primary Peritoneal Carcinoma, AJCC 8th Edition - Clinical stage from 11/15/2020: FIGO Stage III (cT3, cN0, cM0) - Signed by Artis Delay, MD on 11/15/2020 Stage prefix: Initial diagnosis   11/26/2020 - 05/13/2021 Chemotherapy   Patient is on Treatment Plan : OVARIAN Carboplatin (AUC 6) / Paclitaxel (175) q21d x 6 cycles     11/26/2020 Tumor Marker   Patient's tumor was tested for the following markers: CA-125. Results of the tumor marker test revealed 76.8.   12/17/2020 Tumor Marker   Patient's tumor was tested for the following markers: CA-125. Results of the tumor marker test revealed 58.1.   01/07/2021 Tumor Marker   Patient's tumor was tested for the following markers: CA-125. Results of the tumor marker test revealed 21.6.   01/20/2021 Imaging   Decreased omental  soft tissue nodularity and caking, consistent with interval improvement in carcinoma.   No evidence of new or progressive disease within the abdomen or pelvis.   Colonic diverticulosis. No radiographic evidence of diverticulitis.   Large stool burden noted; recommend clinical correlation for possible constipation   02/24/2021 Surgery   Robotic-assisted laparoscopic total hysterectomy with bilateral salpingoophorectomy, peritoneal stripping, mini-lap for omentectomy and intra-abdominal palpation  Findings: On EUA, small mobile uterus. Rectum free. On intra-abdominal entry, normal upper abdominal survey including liver edge, diaphragm and stomach. Omentum with several small (<2cm) areas of thickening, suspected treated tumor versus residual tumor. Uterus 6cm and normal in appearance. Atrophic appearing bilateral adnexa. Some adhesions of the left ovary to the medial leaf of the broad ligament. Minimal peritoneal nodularity versus treated disease within posterior cul de sac and deep left pelvis, all either excised or ablated. No significant peritoneal disease or carcinomatosis. No sigmoid or rectal involvement. Small bowel normal in appearance. No adenopathy. No ascites. R0 resection.    02/24/2021 Pathology Results   A. PERTIONEAL SCAR, EXCISION:  - Microscopic focus of high-grade serous carcinoma   B. 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 OMENTUM, RESECTION:  - Microscopic foci of high-grade serous  carcinoma   03/14/2021 Tumor Marker   Patient's tumor was tested for the following markers: CA-125. Results of the tumor marker test revealed 32.7.   03/23/2021 Imaging   Unremarkable right upper quadrant ultrasound   04/01/2021 Genetic Testing   HRD status through  [employer] was negative.  The report date is 04/01/2021. Negative hereditary cancer genetic testing: no pathogenic variants detected in [employer].  The report date is 04/04/2021.  The [facility] gene panel offered by [employer] includes sequencing and deletion/duplication testing of the following 48 genes: APC, ATM, AXIN2, BAP1, BARD1, BMPR1A, BRCA1, BRCA2, BRIP1, CHD1, CDK4, CDKN2A(p16 and p14ARF), CHEK2, CTNNA1, EGFR, EPCAM, FH, FLCN, GREM1, HOXB13, MEN1, MET, MITF , MLH1, MSH2, MSH3, MSH6, MUTYH, NTHL1, PALB2, PMS2, POLD1, POLE, PTEN, RAD51C, RAD51D, RET, SDHA, SDHB, SDHC, SDHD, SMAD4, STK11,TERT, TP53, TSC1, TSC2, and VHL.   04/12/2021 Tumor Marker   Patient's tumor was tested for the following markers: CA-125. Results of the tumor marker test revealed 12.7.   06/13/2021 Imaging   1. No new mass or lymphadenopathy identified in the abdomen or pelvis. Interval resolution of previous omental soft tissue caking. 2. Other ancillary findings.   06/13/2021 Tumor Marker   Patient's tumor was tested for the following markers: CA-125. Results of the tumor marker test revealed 10.1.   07/11/2021 Tumor Marker   Patient's tumor was tested for the following markers: CA-125. Results of the tumor marker test revealed 9.4.   09/08/2021 Imaging   1. Status post hysterectomy and omentectomy. 2. No evidence of lymphadenopathy or metastatic disease in the abdomen or pelvis.     09/08/2021 Tumor Marker   Patient's tumor was tested for the following markers: CA-125. Results of the tumor marker test revealed 9.0.   02/22/2022 Imaging   IMPRESSION: 1. No acute findings.  No ascites, mass or adenopathy. 2.  Aortic Atherosclerosis (ICD10-170.0).   03/10/2022 Tumor Marker   Patient's tumor was tested for the following markers: CA-125. Results of the tumor marker test revealed 11.7.   06/09/2022 Tumor Marker   Patient's tumor was tested for the following markers: CA-125. Results of the  tumor marker test revealed 29.2.   09/07/2022 Tumor Marker   Patient's tumor was tested for the following markers: CA-125. Results of the tumor marker test revealed 39.7.   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 RADIATION DOSE REDUCTION: This exam was performed according to the departmental dose-optimization program which includes automated exposure control, adjustment of the mA and/or kV according to patient size and/or use of iterative reconstruction technique. CONTRAST:  60mL OMNIPAQUE IOHEXOL 350 MG/ML SOLN COMPARISON:  06/22/2022 and previous FINDINGS: Lower chest: No pleural or pericardial effusion. Visualized lung bases grossly clear, with some motion degradation. Hepatobiliary: No focal liver abnormality is seen. No gallstones, gallbladder wall thickening, or biliary dilatation. Pancreas: Unremarkable. No pancreatic ductal dilatation or surrounding inflammatory changes. Spleen: Normal in size without focal abnormality. Accessory splenule. Adrenals/Urinary Tract: No adrenal mass. Symmetric renal parenchymal enhancement without urolithiasis or hydronephrosis. Urinary bladder incompletely distended. Stomach/Bowel: Stomach incompletely distended, unremarkable. Small bowel decompressed. Post appendectomy. Gas and fecal material partially distend the colon, without acute finding. A few scattered diverticula in the sigmoid segment. Vascular/Lymphatic: Minimal calcified aortic plaque without aneurysm or stenosis. Portal  vein patent. A few scattered mesenteric lymph nodes none greater than 4 mm short axis diameter. No abdominal or pelvic adenopathy. Reproductive: Status post hysterectomy. No adnexal masses. No  peritoneal nodularity. Other: No ascites. No free air. Bilateral pelvic phleboliths stable. Musculoskeletal: Stable lower lumbar spondylitic change with early grade and grade 1 anterolisthesis L4-5 and degenerative disc disease L5-S1. No fracture or worrisome bone lesion. IMPRESSION: 1. No acute findings. 2. No evidence of recurrent or metastatic disease. 3. Sigmoid diverticulosis. Electronically Signed   By: Corlis Leak M.D.   On: 09/15/2022 20:09        PHYSICAL EXAMINATION: ECOG PERFORMANCE STATUS: 0 - Asymptomatic  Vitals:   09/19/22 1351  BP: (!) 144/64  Pulse: 85  Resp: 18  Temp: 97.7 F (36.5 C)  SpO2: 100%   Filed Weights   09/19/22 1351  Weight: 114 lb 12.8 oz (52.1 kg)    GENERAL:alert, no distress and comfortable NEURO: alert & oriented x 3 with fluent speech, no focal motor/sensory deficits  LABORATORY DATA:  I have reviewed the data as listed    Component Value Date/Time   NA 139 09/05/2022 0923   K 4.3 09/05/2022 0923   CL 105 09/05/2022 0923   CO2 29 09/05/2022 0923   GLUCOSE 102 (H) 09/05/2022 0923   BUN 25 (H) 09/05/2022 0923   CREATININE 0.84 09/05/2022 0923   CREATININE 0.88 09/07/2021 0919   CALCIUM 9.1 09/05/2022 0923   PROT 7.1 09/05/2022 0923   ALBUMIN 4.6 09/05/2022 0923   AST 17 09/05/2022 0923   AST 20 09/07/2021 0919   ALT 14 09/05/2022 0923   ALT 15 09/07/2021 0919   ALKPHOS 35 (L) 09/05/2022 0923   BILITOT 0.5 09/05/2022 0923   BILITOT 0.7 09/07/2021 0919   GFRNONAA >60 09/05/2022 0923   GFRNONAA >60 09/07/2021 0919   GFRAA  02/27/2007 1510    >60        The eGFR has been calculated using the MDRD equation. This calculation has not been validated in all clinical    No results found for: "SPEP", "UPEP"  Lab Results  Component Value Date   WBC 6.2 09/05/2022   NEUTROABS 3.9 09/05/2022   HGB 13.8 09/05/2022   HCT 41.1 09/05/2022   MCV 89.9 09/05/2022   PLT 167 09/05/2022      Chemistry      Component Value Date/Time   NA  139 09/05/2022 0923   K 4.3 09/05/2022 0923   CL 105 09/05/2022 0923   CO2 29 09/05/2022 0923   BUN 25 (H) 09/05/2022 0923   CREATININE 0.84 09/05/2022 0923   CREATININE 0.88 09/07/2021 0919      Component Value Date/Time   CALCIUM 9.1 09/05/2022 0923   ALKPHOS 35 (L) 09/05/2022 0923   AST 17 09/05/2022 0923   AST 20 09/07/2021 0919   ALT 14 09/05/2022 0923   ALT 15 09/07/2021 0919   BILITOT 0.5 09/05/2022 0923   BILITOT 0.7 09/07/2021 0919       RADIOGRAPHIC STUDIES: I have reviewed multiple imaging studies with the patient I have personally reviewed the radiological images as listed and agreed with the findings in the report. 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 RADIATION DOSE REDUCTION: This exam was performed according to the  departmental dose-optimization program which includes automated exposure control, adjustment of the mA and/or kV according to patient size and/or use of iterative reconstruction technique. CONTRAST:  60mL OMNIPAQUE IOHEXOL 350 MG/ML SOLN COMPARISON:  06/22/2022 and previous FINDINGS: Lower chest: No pleural or pericardial effusion. Visualized lung bases grossly clear, with some motion degradation. Hepatobiliary: No focal liver abnormality is seen. No gallstones, gallbladder wall thickening, or biliary dilatation. Pancreas: Unremarkable. No pancreatic ductal dilatation or surrounding inflammatory changes. Spleen: Normal in size without focal abnormality. Accessory splenule. Adrenals/Urinary Tract: No adrenal mass. Symmetric renal parenchymal enhancement without urolithiasis or hydronephrosis. Urinary bladder incompletely distended. Stomach/Bowel: Stomach incompletely distended, unremarkable.  Small bowel decompressed. Post appendectomy. Gas and fecal material partially distend the colon, without acute finding. A few scattered diverticula in the sigmoid segment. Vascular/Lymphatic: Minimal calcified aortic plaque without aneurysm or stenosis. Portal vein patent. A few scattered mesenteric lymph nodes none greater than 4 mm short axis diameter. No abdominal or pelvic adenopathy. Reproductive: Status post hysterectomy. No adnexal masses. No peritoneal nodularity. Other: No ascites. No free air. Bilateral pelvic phleboliths stable. Musculoskeletal: Stable lower lumbar spondylitic change with early grade and grade 1 anterolisthesis L4-5 and degenerative disc disease L5-S1. No fracture or worrisome bone lesion. IMPRESSION: 1. No acute findings. 2. No evidence of recurrent or metastatic disease. 3. Sigmoid diverticulosis. Electronically Signed   By: Corlis Leak M.D.   On: 09/15/2022 20:09

## 2022-10-24 ENCOUNTER — Telehealth: Payer: Self-pay | Admitting: *Deleted

## 2022-10-24 NOTE — Telephone Encounter (Signed)
Robin Sellers called the office to schedule a follow up appointment with Dr.Tucker. Patient was given an appointment on Friday, August 2nd at 0830 with arrival time for check in at 0815. Patient agreed to date and time. No further questions at this time.

## 2022-11-13 ENCOUNTER — Other Ambulatory Visit: Payer: Self-pay | Admitting: Internal Medicine

## 2022-11-13 DIAGNOSIS — Z1231 Encounter for screening mammogram for malignant neoplasm of breast: Secondary | ICD-10-CM

## 2022-11-22 ENCOUNTER — Other Ambulatory Visit: Payer: Self-pay

## 2022-11-22 ENCOUNTER — Inpatient Hospital Stay: Payer: Medicare Other | Attending: Gynecologic Oncology

## 2022-11-22 DIAGNOSIS — Z90710 Acquired absence of both cervix and uterus: Secondary | ICD-10-CM | POA: Diagnosis not present

## 2022-11-22 DIAGNOSIS — R971 Elevated cancer antigen 125 [CA 125]: Secondary | ICD-10-CM | POA: Diagnosis not present

## 2022-11-22 DIAGNOSIS — Z9079 Acquired absence of other genital organ(s): Secondary | ICD-10-CM | POA: Diagnosis not present

## 2022-11-22 DIAGNOSIS — C482 Malignant neoplasm of peritoneum, unspecified: Secondary | ICD-10-CM

## 2022-11-22 DIAGNOSIS — Z90722 Acquired absence of ovaries, bilateral: Secondary | ICD-10-CM | POA: Diagnosis not present

## 2022-11-22 DIAGNOSIS — Z8589 Personal history of malignant neoplasm of other organs and systems: Secondary | ICD-10-CM | POA: Insufficient documentation

## 2022-11-22 LAB — CBC WITH DIFFERENTIAL/PLATELET
Abs Immature Granulocytes: 0.01 10*3/uL (ref 0.00–0.07)
Basophils Absolute: 0.1 10*3/uL (ref 0.0–0.1)
Basophils Relative: 1 %
Eosinophils Absolute: 0.1 10*3/uL (ref 0.0–0.5)
Eosinophils Relative: 3 %
HCT: 40.9 % (ref 36.0–46.0)
Hemoglobin: 14 g/dL (ref 12.0–15.0)
Immature Granulocytes: 0 %
Lymphocytes Relative: 32 %
Lymphs Abs: 1.6 10*3/uL (ref 0.7–4.0)
MCH: 30.3 pg (ref 26.0–34.0)
MCHC: 34.2 g/dL (ref 30.0–36.0)
MCV: 88.5 fL (ref 80.0–100.0)
Monocytes Absolute: 0.4 10*3/uL (ref 0.1–1.0)
Monocytes Relative: 7 %
Neutro Abs: 2.8 10*3/uL (ref 1.7–7.7)
Neutrophils Relative %: 57 %
Platelets: 174 10*3/uL (ref 150–400)
RBC: 4.62 MIL/uL (ref 3.87–5.11)
RDW: 12.1 % (ref 11.5–15.5)
WBC: 5 10*3/uL (ref 4.0–10.5)
nRBC: 0 % (ref 0.0–0.2)

## 2022-11-22 LAB — COMPREHENSIVE METABOLIC PANEL
ALT: 12 U/L (ref 0–44)
AST: 18 U/L (ref 15–41)
Albumin: 4.5 g/dL (ref 3.5–5.0)
Alkaline Phosphatase: 34 U/L — ABNORMAL LOW (ref 38–126)
Anion gap: 5 (ref 5–15)
BUN: 22 mg/dL (ref 8–23)
CO2: 30 mmol/L (ref 22–32)
Calcium: 9.6 mg/dL (ref 8.9–10.3)
Chloride: 105 mmol/L (ref 98–111)
Creatinine, Ser: 0.95 mg/dL (ref 0.44–1.00)
GFR, Estimated: >60 mL/min (ref >60)
Glucose, Bld: 105 mg/dL — ABNORMAL HIGH (ref 70–99)
Potassium: 4.8 mmol/L (ref 3.5–5.1)
Sodium: 140 mmol/L (ref 135–145)
Total Bilirubin: 0.6 mg/dL (ref 0.3–1.2)
Total Protein: 6.9 g/dL (ref 6.5–8.1)

## 2022-11-22 NOTE — Progress Notes (Signed)
Gynecologic Oncology Return Clinic Visit  11/24/22  Reason for Visit: Surveillance visit in the setting of history of primary peritoneal cancer   Treatment History: Oncology History Overview Note  Neg genetics   Primary peritoneal carcinomatosis (HCC)  08/21/2020 Initial Diagnosis   The patient reports intermittent right upper quadrant pain that feel like she pulled a muscle beginning in March 2022.  In April, she woke up with sharp pain in her right upper quadrant.  This was her first episode of sharp pain.  She thought that her pain was related to her gallbladder and presented to the emergency department.  She was seen in ED on 4/30. RUQ ultrasound and x-ray were normal as were LFTs and lipase.    10/27/2020 Imaging   Findings highly suspicious for peritoneal carcinomatosis. No site of primary malignancy identified.   Colonic diverticulosis, without radiographic evidence of diverticulitis   11/02/2020 Initial Diagnosis   Primary peritoneal carcinomatosis (HCC)   11/02/2020 Tumor Marker   Patient's tumor was tested for the following markers: CA-125. Results of the tumor marker test revealed 88.   11/11/2020 Pathology Results   FINAL MICROSCOPIC DIAGNOSIS:   A. CUL DE SAC, LEFT ANTERIOR, EXCISION:  -  Poorly differentiated carcinoma  -  See comment   B. SIDEWALL, RIGHT, BIOPSY:  -  Poorly differentiated carcinoma  -  See comment   COMMENT:   By immunohistochemistry, the neoplastic cells are positive for cytokeratin 7, p53, PAX8 and WT1 but negative for cytokeratin 20, GATA3, CDX2 and TTF-1.  Overall, the immunophenotype is consistent with a  gynecologic primary.    11/11/2020 Pathology Results   FINAL MICROSCOPIC DIAGNOSIS:  - Malignant cells consistent with metastatic adenocarcinoma    11/11/2020 Surgery   Pre-operative Diagnosis: Carcinomatosis, mildly elevated CA-125   Post-operative Diagnosis: same, At least stage IIIC gyn malignancy   Operation: Diagnostic laparoscopy,  peritoneal biopsies    Surgeon: Eugene Garnet MD Operative Findings: On EUA, small mobile uterus. On intra-abdominal entry, carcinomatosis appreciated studding the right anterior diaphragm, bilateral liver surfaces, mesentery, pelvic peritoneum. Omental cake with measuring up to 2x3cm. Right ovary adherent to the right uterus with peritoneal studding adjacent. Frondular implants noted along right pelvic sidewall. Miliary disease along anterior cul de sac. Cul de sac covered with friable miliary disease. Small volume dark amber ascites. Specimens: left anterior cul de sac peritoneum, right pelvic sidewall peritoneal biopsy          11/15/2020 Cancer Staging   Staging form: Ovary, Fallopian Tube, and Primary Peritoneal Carcinoma, AJCC 8th Edition - Clinical stage from 11/15/2020: FIGO Stage III (cT3, cN0, cM0) - Signed by Artis Delay, MD on 11/15/2020 Stage prefix: Initial diagnosis   11/26/2020 - 05/13/2021 Chemotherapy   Patient is on Treatment Plan : OVARIAN Carboplatin (AUC 6) / Paclitaxel (175) q21d x 6 cycles     11/26/2020 Tumor Marker   Patient's tumor was tested for the following markers: CA-125. Results of the tumor marker test revealed 76.8.   12/17/2020 Tumor Marker   Patient's tumor was tested for the following markers: CA-125. Results of the tumor marker test revealed 58.1.   01/07/2021 Tumor Marker   Patient's tumor was tested for the following markers: CA-125. Results of the tumor marker test revealed 21.6.   01/20/2021 Imaging   Decreased omental soft tissue nodularity and caking, consistent with interval improvement in carcinoma.   No evidence of new or progressive disease within the abdomen or pelvis.   Colonic diverticulosis. No radiographic evidence of diverticulitis.  Large stool burden noted; recommend clinical correlation for possible constipation   02/24/2021 Surgery   Robotic-assisted laparoscopic total hysterectomy with bilateral salpingoophorectomy, peritoneal  stripping, mini-lap for omentectomy and intra-abdominal palpation  Findings: On EUA, small mobile uterus. Rectum free. On intra-abdominal entry, normal upper abdominal survey including liver edge, diaphragm and stomach. Omentum with several small (<2cm) areas of thickening, suspected treated tumor versus residual tumor. Uterus 6cm and normal in appearance. Atrophic appearing bilateral adnexa. Some adhesions of the left ovary to the medial leaf of the broad ligament. Minimal peritoneal nodularity versus treated disease within posterior cul de sac and deep left pelvis, all either excised or ablated. No significant peritoneal disease or carcinomatosis. No sigmoid or rectal involvement. Small bowel normal in appearance. No adenopathy. No ascites. R0 resection.    02/24/2021 Pathology Results   A. PERTIONEAL SCAR, EXCISION:  - Microscopic focus of high-grade serous carcinoma   B. 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 OMENTUM, RESECTION:  - Microscopic foci of high-grade serous carcinoma   03/14/2021 Tumor Marker   Patient's tumor was tested for the following markers: CA-125. Results of the tumor marker test revealed 32.7.   03/23/2021 Imaging   Unremarkable right upper quadrant ultrasound   04/01/2021 Genetic Testing   HRD status through [employer] was negative.  The report date is 04/01/2021. Negative hereditary cancer genetic testing: no pathogenic variants detected in [employer].  The report date is 04/04/2021.  The [facility] gene panel offered by [employer] includes sequencing and deletion/duplication testing of the following 48 genes: APC, ATM, AXIN2, BAP1, BARD1, BMPR1A, BRCA1, BRCA2, BRIP1, CHD1, CDK4, CDKN2A(p16 and p14ARF), CHEK2, CTNNA1,  EGFR, EPCAM, FH, FLCN, GREM1, HOXB13, MEN1, MET, MITF , MLH1, MSH2, MSH3, MSH6, MUTYH, NTHL1, PALB2, PMS2, POLD1, POLE, PTEN, RAD51C, RAD51D, RET, SDHA, SDHB, SDHC, SDHD, SMAD4, STK11,TERT, TP53, TSC1, TSC2, and VHL.   04/12/2021 Tumor Marker   Patient's tumor was tested for the following markers: CA-125. Results of the tumor marker test revealed 12.7.   06/13/2021 Imaging   1. No new mass or lymphadenopathy identified in the abdomen or pelvis. Interval resolution of previous omental soft tissue caking. 2. Other ancillary findings.   06/13/2021 Tumor Marker   Patient's tumor was tested for the following markers: CA-125. Results of the tumor marker test revealed 10.1.   07/11/2021 Tumor Marker   Patient's tumor was tested for the following markers: CA-125. Results of the tumor marker test revealed 9.4.   09/08/2021 Imaging   1. Status post hysterectomy and omentectomy. 2. No evidence of lymphadenopathy or metastatic disease in the abdomen or pelvis.     09/08/2021 Tumor Marker   Patient's tumor was tested for the following markers: CA-125. Results of the tumor marker test revealed 9.0.   02/22/2022 Imaging   IMPRESSION: 1. No acute findings.  No ascites, mass or adenopathy. 2.  Aortic Atherosclerosis (ICD10-170.0).   03/10/2022 Tumor Marker   Patient's tumor was tested for the following markers: CA-125. Results of the tumor marker test revealed 11.7.   06/09/2022 Tumor Marker   Patient's tumor was tested for the following markers: CA-125. Results of the tumor marker test revealed 29.2.   09/07/2022 Tumor Marker   Patient's tumor was tested for the following markers: CA-125. Results of the  tumor marker test revealed 39.7.   09/15/2022 Imaging   CT ABDOMEN PELVIS W CONTRAST  Result Date: 09/15/2022 CLINICAL DATA:  History of ovarian carcinoma, elevated CA-125 EXAM: CT ABDOMEN AND PELVIS WITH CONTRAST TECHNIQUE: Multidetector CT imaging of the abdomen and pelvis was performed using  the standard protocol following bolus administration of intravenous contrast. RADIATION DOSE REDUCTION: This exam was performed according to the departmental dose-optimization program which includes automated exposure control, adjustment of the mA and/or kV according to patient size and/or use of iterative reconstruction technique. CONTRAST:  60mL OMNIPAQUE IOHEXOL 350 MG/ML SOLN COMPARISON:  06/22/2022 and previous FINDINGS: Lower chest: No pleural or pericardial effusion. Visualized lung bases grossly clear, with some motion degradation. Hepatobiliary: No focal liver abnormality is seen. No gallstones, gallbladder wall thickening, or biliary dilatation. Pancreas: Unremarkable. No pancreatic ductal dilatation or surrounding inflammatory changes. Spleen: Normal in size without focal abnormality. Accessory splenule. Adrenals/Urinary Tract: No adrenal mass. Symmetric renal parenchymal enhancement without urolithiasis or hydronephrosis. Urinary bladder incompletely distended. Stomach/Bowel: Stomach incompletely distended, unremarkable. Small bowel decompressed. Post appendectomy. Gas and fecal material partially distend the colon, without acute finding. A few scattered diverticula in the sigmoid segment. Vascular/Lymphatic: Minimal calcified aortic plaque without aneurysm or stenosis. Portal vein patent. A few scattered mesenteric lymph nodes none greater than 4 mm short axis diameter. No abdominal or pelvic adenopathy. Reproductive: Status post hysterectomy. No adnexal masses. No peritoneal nodularity. Other: No ascites. No free air. Bilateral pelvic phleboliths stable. Musculoskeletal: Stable lower lumbar spondylitic change with early grade and grade 1 anterolisthesis L4-5 and degenerative disc disease L5-S1. No fracture or worrisome bone lesion. IMPRESSION: 1. No acute findings. 2. No evidence of recurrent or metastatic disease. 3. Sigmoid diverticulosis. Electronically Signed   By: Corlis Leak M.D.   On: 09/15/2022  20:09      11/24/2022 Tumor Marker   Patient's tumor was tested for the following markers: CA-125. Results of the tumor marker test revealed 47.6.     Interval History: Overall doing well.  Continues to have right upper quadrant/epigastric discomfort from time to time.  Endorses good appetite, no nausea no early satiety.  Regular bowel function, has had to take something to get her bowels moving 2 or 3 times in the last month after she did not have a bowel movement 1 day.  Denies any urinary symptoms.  Walking 2-3 miles a day.  Past Medical/Surgical History: Past Medical History:  Diagnosis Date   Anemia    Cancer (HCC)    Family history of breast cancer 03/14/2021   Hole, retinal    Right eye   Osteoporosis    Polyp of colon, unspecified part of colon, unspecified type    Benign    Past Surgical History:  Procedure Laterality Date   APPENDECTOMY  02/27/2007   lsc   BILATERAL SALPINGECTOMY Right 03/1978   right ectopic, tied other tube   CATARACT EXTRACTION Right 10/31/2011   Dr. Dione Booze   COLONOSCOPY  03/2019   Dr. Levora Angel   COLONOSCOPY WITH PROPOFOL N/A 04/01/2018   Procedure: COLONOSCOPY WITH PROPOFOL;  Surgeon: Kathi Der, MD;  Location: [facility];  Service: Gastroenterology;  Laterality: N/A;   DILATION AND CURETTAGE OF UTERUS     x3, 2 for miscarriages, one for polyp vs fibroid   HAND / FINGER LESION EXCISION Left    lt. index finger   LAPAROSCOPY N/A 11/11/2020   Procedure: LAPAROSCOPY DIAGNOSTIC WITH PERITONEAL BIOPSY;  Surgeon: [name], MD;  Location: [facility];  Service: Gynecology;  Laterality: N/A;   POLYPECTOMY  04/01/2018   Procedure: POLYPECTOMY;  Surgeon: Kathi Der, MD;  Location: [facility];  Service: Gastroenterology;;   RETINAL TEAR REPAIR CRYOTHERAPY Right 04/25/2011   Dr. Dwyane Dee INJECTION  04/01/2018   Procedure: SUBMUCOSAL INJECTION;  Surgeon: Kathi Der, MD;  Location: [facility];  Service:  Gastroenterology;;   TUBAL LIGATION Left 03/1978    Family History  Problem Relation Age of Onset   Lung cancer Mother 87       smoking hx   Skin cancer Father        dx after 50; non-melanoma; scalp   Lung cancer Maternal Aunt        dx after 50; smoking hx   Breast cancer Daughter 40       ER+   Skin cancer Cousin        face; surgery only   Ovarian cancer Neg Hx    Endometrial cancer Neg Hx    Prostate cancer Neg Hx    Pancreatic cancer Neg Hx    Colon cancer Neg Hx     Social History   Socioeconomic History   Marital status: Legally Separated    Spouse name: Not on file   Number of children: Not on file   Years of education: Not on file   Highest education level: Not on file  Occupational History   Not on file  Tobacco Use   Smoking status: Never    Passive exposure: Past (18 years)   Smokeless tobacco: Never  Vaping Use   Vaping status: Never Used  Substance and Sexual Activity   Alcohol use: Not Currently   Drug use: Never   Sexual activity: Not Currently  Other Topics Concern   Not on file  Social History Narrative   Not on file   Social Determinants of Health   Financial Resource Strain: Not on file  Food Insecurity: Not on file  Transportation Needs: Not on file  Physical Activity: Not on file  Stress: Not on file  Social Connections: Not on file    Current Medications:  Current Outpatient Medications:    bimatoprost (LATISSE) 0.03 % ophthalmic solution, Place into both eyes at bedtime. Place one drop on applicator and apply evenly along the skin of the upper eyelid at base of eyelashes once daily at bedtime; repeat procedure for second eye (use a clean applicator)., Disp: , Rfl:    risedronate (ACTONEL) 150 MG tablet, Take 150 mg by mouth every 30 (thirty) days. with water on empty stomach, nothing by mouth or lie down for next 30 minutes., Disp: , Rfl:    tretinoin (RETIN-A) 0.025 % cream, 1 application a pearl-sized amount to face in the  evening Externally Once a day for 30 days, Disp: , Rfl:   Review of Systems: Denies appetite changes, fevers, chills, fatigue, unexplained weight changes. Denies hearing loss, neck lumps or masses, mouth sores, ringing in ears or voice changes. Denies cough or wheezing.  Denies shortness of breath. Denies chest pain or palpitations. Denies leg swelling. Denies abdominal distention, pain, blood in stools, constipation, diarrhea, nausea, vomiting, or early satiety. Denies pain with intercourse, dysuria, frequency, hematuria or incontinence. Denies hot flashes, pelvic pain, vaginal bleeding or vaginal discharge.   Denies joint pain, back pain or muscle pain/cramps. Denies itching, rash, or wounds. Denies dizziness, headaches, numbness or seizures. Denies swollen lymph nodes or glands, denies easy bruising or bleeding. Denies anxiety, depression, confusion, or decreased concentration.  Physical Exam: BP 118/64 (BP Location: Left Arm, Patient Position: Sitting)   Pulse 75   Temp 98.5 F (36.9 C) (Oral)   Resp 18   Wt 113 lb (51.3 kg)   SpO2 100%   BMI 19.40 kg/m  General: Alert, oriented, no acute distress. HEENT: Normocephalic, atraumatic, sclera anicteric. Chest: Clear to auscultation bilaterally.  No wheezes or rhonchi. Cardiovascular: Regular rate and rhythm, no murmurs. Abdomen: soft, nontender.  Normoactive bowel sounds.  No masses or hepatosplenomegaly appreciated.   Extremities: Grossly normal range of motion.  Warm, well perfused.  No edema bilaterally. Skin: No rashes or lesions noted. Lymphatics: No cervical, supraclavicular, or inguinal adenopathy. GU: Normal appearing external genitalia without erythema, excoriation, or lesions.  Speculum exam reveals cuff intact, no masses.  Bimanual exam reveals cuff intact, no nodularity.  Rectovaginal exam confirms findings although there is some fullness just above the vaginal cuff at the level of the rectum that is mobile and soft,  suspect that this is redundant colon with stool in it.  Laboratory & Radiologic Studies:          Component Ref Range & Units 2 d ago (11/22/22) 2 mo ago (09/05/22) 5 mo ago (06/07/22) 8 mo ago (03/08/22) 1 yr ago (11/23/21) 1 yr ago (09/07/21) 1 yr ago (07/08/21)  Cancer Antigen (CA) 125 0.0 - 38.1 U/mL 47.6 High  39.7 High  CM 29.2 CM 11.7 CM 9.3 CM 9.0 CM 9.4 CM         Assessment & Plan: Robin Sellers is a 72 y.o. 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 Genetic testing was negative. CA-125 continues to increase, most recent CT in 08/2022 negative for disease recurrence.    From a symptom standpoint, the patient continues to do well.  Discussed her CA125 which is slowly continuing to rise.  Last imaging was in late May and negative for definitive evidence of metastatic/recurrent cancer.  Given continued right upper quadrant/epigastric intermittent discomfort as well as mild changes to bowel function, discussed possibility of continuing to monitor CA125 and symptoms closely versus getting repeat scan 3 months after her last one at the end of this month.  After our discussion, patient's preference was to get the CT scan at the end of August.  I will call her with these results.  Per NCCN surveillance recommendations, we will continue with visits every 3 months alternating between Dr. Bertis Ruddy and myself.  CA-125 was mildly elevated (in the 80s) at the time of her presentation.  We reviewed signs and symptoms that would be concerning for disease recurrence and I stressed the importance of calling if she develops any of these.  20 minutes of total time was spent for this patient encounter, including preparation, face-to-face counseling with the patient and coordination of care, and documentation of the encounter.  Eugene Garnet, MD  Division of  Gynecologic Oncology  Department of Obstetrics and Gynecology  [facility]

## 2022-11-24 ENCOUNTER — Inpatient Hospital Stay: Payer: Medicare Other | Attending: Hematology and Oncology | Admitting: Gynecologic Oncology

## 2022-11-24 ENCOUNTER — Encounter: Payer: Self-pay | Admitting: Gynecologic Oncology

## 2022-11-24 ENCOUNTER — Other Ambulatory Visit: Payer: Self-pay

## 2022-11-24 VITALS — BP 118/64 | HR 75 | Temp 98.5°F | Resp 18 | Wt 113.0 lb

## 2022-11-24 DIAGNOSIS — R971 Elevated cancer antigen 125 [CA 125]: Secondary | ICD-10-CM | POA: Diagnosis not present

## 2022-11-24 DIAGNOSIS — Z9221 Personal history of antineoplastic chemotherapy: Secondary | ICD-10-CM | POA: Diagnosis not present

## 2022-11-24 DIAGNOSIS — Z90710 Acquired absence of both cervix and uterus: Secondary | ICD-10-CM | POA: Diagnosis not present

## 2022-11-24 DIAGNOSIS — Z8589 Personal history of malignant neoplasm of other organs and systems: Secondary | ICD-10-CM | POA: Diagnosis not present

## 2022-11-24 DIAGNOSIS — Z90722 Acquired absence of ovaries, bilateral: Secondary | ICD-10-CM | POA: Insufficient documentation

## 2022-11-24 DIAGNOSIS — C482 Malignant neoplasm of peritoneum, unspecified: Secondary | ICD-10-CM | POA: Diagnosis present

## 2022-11-24 DIAGNOSIS — R978 Other abnormal tumor markers: Secondary | ICD-10-CM

## 2022-11-24 NOTE — Patient Instructions (Signed)
It was good to see you today.  I will call you with your CT results.  The office will get you scheduled for a 42-month follow-up with me.

## 2022-12-04 ENCOUNTER — Ambulatory Visit (HOSPITAL_COMMUNITY)
Admission: RE | Admit: 2022-12-04 | Discharge: 2022-12-04 | Disposition: A | Discharge status: Home / Self Care | Payer: Medicare Other | Source: Ambulatory Visit | Attending: Gynecologic Oncology | Admitting: Gynecologic Oncology

## 2022-12-04 DIAGNOSIS — C482 Malignant neoplasm of peritoneum, unspecified: Secondary | ICD-10-CM | POA: Diagnosis present

## 2022-12-04 MED ORDER — IOHEXOL 9 MG/ML PO SOLN
500.0000 mL | ORAL | Status: AC
  Administered 2022-12-04: 1000 mL via ORAL

## 2022-12-04 MED ORDER — IOHEXOL 300 MG/ML  SOLN
100.0000 mL | Freq: Once | INTRAMUSCULAR | Status: AC | PRN
  Administered 2022-12-04: 100 mL via INTRAVENOUS

## 2022-12-04 MED ORDER — IOHEXOL 9 MG/ML PO SOLN
ORAL | Status: AC
  Filled 2022-12-04: qty 1000

## 2022-12-05 ENCOUNTER — Telehealth: Payer: Self-pay | Admitting: Oncology

## 2022-12-05 NOTE — Progress Notes (Signed)
Just spoke with her - discussed option of secondary debulking versus chemo. I think this area is amenable to excision. Clydie Braun - could you look at the surgery calendar for next available date for her? I told her you'd call later today. She'll need a pre-op with me once we know date of surgery. Thank you

## 2022-12-05 NOTE — Telephone Encounter (Signed)
Cline Crock and discussed open surgery dates with Dr. [name].  She would like to schedule for 12/20/22.  Also scheduled a preop appointment with Dr. [name] on 12/08/2022.

## 2022-12-05 NOTE — H&P (View-Only) (Signed)
 Just spoke with her - discussed option of secondary debulking versus chemo. I think this area is amenable to excision. Clydie Braun - could you look at the surgery calendar for next available date for her? I told her you'd call later today. She'll need a pre-op with me once we know date of surgery. Thank you

## 2022-12-06 NOTE — Progress Notes (Signed)
Signed molecular profiling requisition request along with demographic sheet, insurance card, pathology report, and most recent office note faxed to [employer] at [phone] per MD request with receipt confirmation.

## 2022-12-07 NOTE — Progress Notes (Addendum)
Anesthesia Review:  PCP: DR   Burton Apley  Cardiologist : none  Chest x-ray : EKG : Echo : Stress test: Cardiac Cath :  Activity level: can do a flgiht of stairs without diffficutly  Sleep Study/ CPAP : none  Fasting Blood Sugar :      / Checks Blood Sugar -- times a day:   Blood Thinner/ Instructions /Last Dose: ASA / Instructions/ Last Dose :    11/22/22 - cbc and cmp- epic    PT has bowel prep instructons per MD.

## 2022-12-08 ENCOUNTER — Telehealth: Payer: Self-pay | Admitting: Surgery

## 2022-12-08 ENCOUNTER — Inpatient Hospital Stay (HOSPITAL_BASED_OUTPATIENT_CLINIC_OR_DEPARTMENT_OTHER): Payer: Medicare Other | Admitting: Gynecologic Oncology

## 2022-12-08 ENCOUNTER — Inpatient Hospital Stay: Payer: Medicare Other | Admitting: Gynecologic Oncology

## 2022-12-08 ENCOUNTER — Encounter: Payer: Self-pay | Admitting: Gynecologic Oncology

## 2022-12-08 ENCOUNTER — Other Ambulatory Visit: Payer: Self-pay

## 2022-12-08 VITALS — BP 127/50 | HR 87 | Temp 98.5°F | Resp 18 | Ht 64.0 in | Wt 113.5 lb

## 2022-12-08 DIAGNOSIS — R1900 Intra-abdominal and pelvic swelling, mass and lump, unspecified site: Secondary | ICD-10-CM

## 2022-12-08 DIAGNOSIS — C482 Malignant neoplasm of peritoneum, unspecified: Secondary | ICD-10-CM

## 2022-12-08 MED ORDER — SENNOSIDES-DOCUSATE SODIUM 8.6-50 MG PO TABS
2.0000 | ORAL_TABLET | Freq: Every day | ORAL | 0 refills | Status: DC
Start: 2022-12-08 — End: 2022-12-22

## 2022-12-08 MED ORDER — BISACODYL 5 MG PO TBEC
DELAYED_RELEASE_TABLET | ORAL | 0 refills | Status: DC
Start: 2022-12-19 — End: 2022-12-22

## 2022-12-08 MED ORDER — POLYETHYLENE GLYCOL 3350 17 GM/SCOOP PO POWD
ORAL | 0 refills | Status: DC
Start: 2022-12-19 — End: 2022-12-22

## 2022-12-08 MED ORDER — METRONIDAZOLE 500 MG PO TABS
ORAL_TABLET | ORAL | 0 refills | Status: DC
Start: 2022-12-19 — End: 2022-12-22

## 2022-12-08 MED ORDER — TRAMADOL HCL 50 MG PO TABS
50.0000 mg | ORAL_TABLET | Freq: Four times a day (QID) | ORAL | 0 refills | Status: DC | PRN
Start: 2022-12-08 — End: 2023-01-12

## 2022-12-08 NOTE — Patient Instructions (Addendum)
Preparing for your Surgery  Plan for surgery on December 20, 2022 with Dr. Eugene Garnet at [facility]. You will be scheduled for diagnostic laparoscopy (looking into the abdomen with a small incision in the abdomen), possible robotic assisted laparoscopic versus open pelvic mass excision, possible bowel surgery.   Pre-operative Testing -You will receive a phone call from presurgical testing at [facility] to arrange for a pre-operative appointment and lab work.  -Bring your insurance card, copy of an advanced directive if applicable, medication list  -At that visit, you will be asked to sign a consent for a possible blood transfusion in case a transfusion becomes necessary during surgery.  The need for a blood transfusion is rare but having consent is a necessary part of your care.     -You should not be taking blood thinners or aspirin at least ten days prior to surgery unless instructed by your surgeon.  -Do not take supplements such as fish oil (omega 3), red yeast rice, turmeric before your surgery. You want to avoid medications with aspirin in them including headache powders such as BC or Goody's), Excedrin migraine.  Day Before Surgery at Home -You will have a bowel prep to perform the day before surgery. You will be advised you can have clear liquids up until 3 hours before your surgery.    AVOID GAS PRODUCING BEVERAGES. Things to avoid include carbonated beverages (fizzy beverages, sodas).   If your bowels are filled with gas, your surgeon will have difficulty visualizing your pelvic organs which increases your surgical risks.  Your role in recovery Your role is to become active as soon as directed by your doctor, while still giving yourself time to heal.  Rest when you feel tired. You will be asked to do the following in order to speed your recovery:  - Cough and breathe deeply. This helps to clear and expand your lungs and can prevent pneumonia after surgery.   - STAY ACTIVE WHEN YOU GET HOME. Do mild physical activity. Walking or moving your legs help your circulation and body functions return to normal. Do not try to get up or walk alone the first time after surgery.   -If you develop swelling on one leg or the other, pain in the back of your leg, redness/warmth in one of your legs, please call the office or go to the Emergency Room to have a doppler to rule out a blood clot. For shortness of breath, chest pain-seek care in the Emergency Room as soon as possible. - Actively manage your pain. Managing your pain lets you move in comfort. We will ask you to rate your pain on a scale of zero to 10. It is your responsibility to tell your doctor or nurse where and how much you hurt so your pain can be treated.  Special Considerations -If you are diabetic, you may be placed on insulin after surgery to have closer control over your blood sugars to promote healing and recovery.  This does not mean that you will be discharged on insulin.  If applicable, your oral antidiabetics will be resumed when you are tolerating a solid diet.  -Your final pathology results from surgery should be available around one week after surgery and the results will be relayed to you when available.  -Dr. Antionette Char is the surgeon that assists your GYN Oncologist with surgery.  If you end up staying the night, the next day after your surgery you will either see Dr. [name],  Dr. Alvester Morin, or Dr. Antionette Char.  -FMLA forms can be faxed to [phone] and please allow 5-7 business days for completion.  Pain Management After Surgery -You have been prescribed your pain medication and bowel regimen medications before surgery so that you can have these available when you are discharged from the hospital. The pain medication is for use ONLY AFTER surgery and a new prescription will not be given.   -Make sure that you have Tylenol IF YOU ARE ABLE TO TAKE THESE MEDICATION at home to  use on a regular basis after surgery for pain control.   -Review the attached handout on narcotic use and their risks and side effects.   Bowel Regimen -You have been prescribed Sennakot-S to take nightly to prevent constipation especially if you are taking the narcotic pain medication intermittently.  It is important to prevent constipation and drink adequate amounts of liquids. You can stop taking this medication when you are not taking pain medication and you are back on your normal bowel routine.  Risks of Surgery Risks of surgery are low but include bleeding, infection, damage to surrounding structures, re-operation, blood clots, and very rarely death.   Blood Transfusion Information (For the consent to be signed before surgery)  We will be checking your blood type before surgery so in case of emergencies, we will know what type of blood you would need.                                            WHAT IS A BLOOD TRANSFUSION?  A transfusion is the replacement of blood or some of its parts. Blood is made up of multiple cells which provide different functions. Red blood cells carry oxygen and are used for blood loss replacement. White blood cells fight against infection. Platelets control bleeding. Plasma helps clot blood. Other blood products are available for specialized needs, such as hemophilia or other clotting disorders. BEFORE THE TRANSFUSION  Who gives blood for transfusions?  You may be able to donate blood to be used at a later date on yourself (autologous donation). Relatives can be asked to donate blood. This is generally not any safer than if you have received blood from a stranger. The same precautions are taken to ensure safety when a relative's blood is donated. Healthy volunteers who are fully evaluated to make sure their blood is safe. This is blood bank blood. Transfusion therapy is the safest it has ever been in the practice of medicine. Before blood is taken from a  donor, a complete history is taken to make sure that person has no history of diseases nor engages in risky social behavior (examples are intravenous drug use or sexual activity with multiple partners). The donor's travel history is screened to minimize risk of transmitting infections, such as malaria. The donated blood is tested for signs of infectious diseases, such as HIV and hepatitis. The blood is then tested to be sure it is compatible with you in order to minimize the chance of a transfusion reaction. If you or a relative donates blood, this is often done in anticipation of surgery and is not appropriate for emergency situations. It takes many days to process the donated blood. RISKS AND COMPLICATIONS Although transfusion therapy is very safe and saves many lives, the main dangers of transfusion include:  Getting an infectious disease. Developing a transfusion reaction. This is  an allergic reaction to something in the blood you were given. Every precaution is taken to prevent this. The decision to have a blood transfusion has been considered carefully by your caregiver before blood is given. Blood is not given unless the benefits outweigh the risks.  AFTER SURGERY INSTRUCTIONS  Return to work: 4-6 weeks if applicable  You will need to be on a blood thinner after surgery to prevent blood clots for 2 weeks versus 4 weeks based on the surgery being through small incisions or a large incision. This can be given in pill form or injections.  AVOID USE OF NSAIDS (IBUPROFEN, NAPROXEN) WHILE TAKING THE BLOOD THINNER.   If you have a larger incision: You may have a white honeycomb dressing over your larger incision. This dressing can be removed 5 days after surgery and you do not need to reapply a new dressing. Once you remove the dressing, you will notice that you have the surgical glue (dermabond) on the incision and this will peel off on its own. You can get this dressing wet in the shower the days  after surgery prior to removal on the 5th day.   Activity: 1. Be up and out of the bed during the day.  Take a nap if needed.  You may walk up steps but be careful and use the hand rail.  Stair climbing will tire you more than you think, you may need to stop part way and rest.   2. No lifting or straining for 6 weeks over 10 pounds. No pushing, pulling, straining for 6 weeks.  3. No driving for around 5-95 days(s).  Do not drive if you are taking narcotic pain medicine and make sure that your reaction time has returned.   4. You can shower as soon as the next day after surgery. Shower daily.  Use your regular soap and water (not directly on the incision) and pat your incision(s) dry afterwards; don't rub.  No tub baths or submerging your body in water until cleared by your surgeon. If you have the soap that was given to you by pre-surgical testing that was used before surgery, you do not need to use it afterwards because this can irritate your incisions.   5. No sexual activity and nothing in the vagina for 4-6 weeks.  6. You may experience a small amount of clear drainage from your incisions, which is normal.  If the drainage persists, increases, or changes color please call the office.  7. Do not use creams, lotions, or ointments such as neosporin on your incisions after surgery until advised by your surgeon because they can cause removal of the dermabond glue on your incisions.    8. Take Tylenol first for pain if you are able to take these medication and only use narcotic pain medication for severe pain not relieved by the Tylenol.  Monitor your Tylenol intake to a max of 4,000 mg in a 24 hour period.   Diet: 1. Low sodium Heart Healthy Diet is recommended but you are cleared to resume your normal (before surgery) diet after your procedure.  2. It is safe to use a laxative, such as Miralax or Colace, if you have difficulty moving your bowels. You have been prescribed Sennakot-S to take at  bedtime every evening after surgery to keep bowel movements regular and to prevent constipation. If you have bowel surgery, this recommendation is subject to change.   Wound Care: 1. Keep clean and dry.  Shower daily.  Reasons to call the Doctor: Fever - Oral temperature greater than 100.4 degrees Fahrenheit Foul-smelling vaginal discharge Difficulty urinating Nausea and vomiting Increased pain at the site of the incision that is unrelieved with pain medicine. Difficulty breathing with or without chest pain New calf pain especially if only on one side Sudden, continuing increased vaginal bleeding with or without clots.   Contacts: For questions or concerns you should contact:  Dr. Eugene Garnet at [phone]  Warner Mccreedy, NP at [phone]  After Hours: call [phone] and have the GYN Oncologist paged/contacted (after 5 pm or on the weekends). You will speak with an after hours RN and let he or she know you have had surgery.  Messages sent via mychart are for non-urgent matters and are not responded to after hours so for urgent needs, please call the after hours number.  COLON BOWEL PREP     FIVE DAYS PRIOR TO YOUR SURGERY   Obtain supplies for the bowel prep at a pharmacy of your choice: Office sent prescription for your antibiotic pills (Flagyl)  A bottle of Miralax (238 g)- prescription sent in A large bottle of Gatorade/Powerade (64 oz), avoid red/purple coloring- no prescription required Dulcolax tablets (4 tablets)- prescription sent in   Change your diet to make the bowel prep go more easily: Switch to a bland, low fiber diet Stop eating any nuts, popcorn, or fruit with seeds.  Stop all fiber supplements such as Metamucil, Miralax, etc.     Improve nutrition: Consider drinking 2-3 nutritional shakes (Ex: Ensure Surgery) every day, starting 5 days prior to surgery     DAY PRIOR TO SURGERY    Switch to a full liquid diet the day before surgery Drink  plenty of liquids all day to avoid getting dehydrated      7:00 am Swallow 4 dulcolax tablets with some water   10:00 am Mix the bottle of Miralax with the 64 ounces of Gatorade Drink the Gatorade/Miralax mixture gradually (8 oz glass every 15 minutes) until gone. (You should finish in 4 hours)   2:00pm Take 2 Flagyl (total of 1000 mg) tablets   3:00pm Take 2 Flagyl (total of 1000 mg) tablets Drink plenty of clear liquids all evening to avoid getting dehydrated   10:00pm Take 2 Flagyl (total of 1000 mg) tablets Drink 2 Carbohydrate loading nutrition drinks (ex: Ensure Presurgery). These will be given at pre-op appointment. Do not eat anything solid after bedtime (midnight) the night before your surgery.   BUT DO drink plenty of clear liquids (Water, Gatorade, juice, soda, coffee, tea, broths, etc.) up to 3 hours prior to surgery to avoid getting dehydrated.      MORNING OF SURGERY   Remember to not to eat anything solid that morning  Drink one final carbohydrate loading nutritional drink (ex: Ensure Presurgery) upon waking up in the morning (needs to be 3 hours before your surgery). Hold or take medications as recommended by the hospital staff at your Preoperative visit Stop drinking liquids before you leave the house (>3 hours prior to surgery)

## 2022-12-08 NOTE — Progress Notes (Signed)
Sent message, via epic in basket, requesting orders in epic from surgeon.  

## 2022-12-08 NOTE — Progress Notes (Signed)
Gynecologic Oncology Return Clinic Visit  12/08/22  Reason for Visit: treatment planning  Treatment History: Oncology History Overview Note  Neg genetics   Primary peritoneal carcinomatosis (HCC)  08/21/2020 Initial Diagnosis   The patient reports intermittent right upper quadrant pain that feel like she pulled a muscle beginning in March 2022.  In April, she woke up with sharp pain in her right upper quadrant.  This was her first episode of sharp pain.  She thought that her pain was related to her gallbladder and presented to the emergency department.  She was seen in ED on 4/30. RUQ ultrasound and x-ray were normal as were LFTs and lipase.    10/27/2020 Imaging   Findings highly suspicious for peritoneal carcinomatosis. No site of primary malignancy identified.   Colonic diverticulosis, without radiographic evidence of diverticulitis   11/02/2020 Initial Diagnosis   Primary peritoneal carcinomatosis (HCC)   11/02/2020 Tumor Marker   Patient's tumor was tested for the following markers: CA-125. Results of the tumor marker test revealed 88.   11/11/2020 Pathology Results   FINAL MICROSCOPIC DIAGNOSIS:   A. CUL DE SAC, LEFT ANTERIOR, EXCISION:  -  Poorly differentiated carcinoma  -  See comment   B. SIDEWALL, RIGHT, BIOPSY:  -  Poorly differentiated carcinoma  -  See comment   COMMENT:   By immunohistochemistry, the neoplastic cells are positive for cytokeratin 7, p53, PAX8 and WT1 but negative for cytokeratin 20, GATA3, CDX2 and TTF-1.  Overall, the immunophenotype is consistent with a  gynecologic primary.    11/11/2020 Pathology Results   FINAL MICROSCOPIC DIAGNOSIS:  - Malignant cells consistent with metastatic adenocarcinoma    11/11/2020 Surgery   Pre-operative Diagnosis: Carcinomatosis, mildly elevated CA-125   Post-operative Diagnosis: same, At least stage IIIC gyn malignancy   Operation: Diagnostic laparoscopy, peritoneal biopsies    Surgeon: Eugene Garnet  MD Operative Findings: On EUA, small mobile uterus. On intra-abdominal entry, carcinomatosis appreciated studding the right anterior diaphragm, bilateral liver surfaces, mesentery, pelvic peritoneum. Omental cake with measuring up to 2x3cm. Right ovary adherent to the right uterus with peritoneal studding adjacent. Frondular implants noted along right pelvic sidewall. Miliary disease along anterior cul de sac. Cul de sac covered with friable miliary disease. Small volume dark amber ascites. Specimens: left anterior cul de sac peritoneum, right pelvic sidewall peritoneal biopsy          11/15/2020 Cancer Staging   Staging form: Ovary, Fallopian Tube, and Primary Peritoneal Carcinoma, AJCC 8th Edition - Clinical stage from 11/15/2020: FIGO Stage III (cT3, cN0, cM0) - Signed by Artis Delay, MD on 11/15/2020 Stage prefix: Initial diagnosis   11/26/2020 - 05/13/2021 Chemotherapy   Patient is on Treatment Plan : OVARIAN Carboplatin (AUC 6) / Paclitaxel (175) q21d x 6 cycles     11/26/2020 Tumor Marker   Patient's tumor was tested for the following markers: CA-125. Results of the tumor marker test revealed 76.8.   12/17/2020 Tumor Marker   Patient's tumor was tested for the following markers: CA-125. Results of the tumor marker test revealed 58.1.   01/07/2021 Tumor Marker   Patient's tumor was tested for the following markers: CA-125. Results of the tumor marker test revealed 21.6.   01/20/2021 Imaging   Decreased omental soft tissue nodularity and caking, consistent with interval improvement in carcinoma.   No evidence of new or progressive disease within the abdomen or pelvis.   Colonic diverticulosis. No radiographic evidence of diverticulitis.   Large stool burden noted; recommend clinical correlation for  possible constipation   02/24/2021 Surgery   Robotic-assisted laparoscopic total hysterectomy with bilateral salpingoophorectomy, peritoneal stripping, mini-lap for omentectomy and  intra-abdominal palpation  Findings: On EUA, small mobile uterus. Rectum free. On intra-abdominal entry, normal upper abdominal survey including liver edge, diaphragm and stomach. Omentum with several small (<2cm) areas of thickening, suspected treated tumor versus residual tumor. Uterus 6cm and normal in appearance. Atrophic appearing bilateral adnexa. Some adhesions of the left ovary to the medial leaf of the broad ligament. Minimal peritoneal nodularity versus treated disease within posterior cul de sac and deep left pelvis, all either excised or ablated. No significant peritoneal disease or carcinomatosis. No sigmoid or rectal involvement. Small bowel normal in appearance. No adenopathy. No ascites. R0 resection.    02/24/2021 Pathology Results   A. PERTIONEAL SCAR, EXCISION:  - Microscopic focus of high-grade serous carcinoma   B. 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 OMENTUM, RESECTION:  - Microscopic foci of high-grade serous carcinoma   03/14/2021 Tumor Marker   Patient's tumor was tested for the following markers: CA-125. Results of the tumor marker test revealed 32.7.   03/23/2021 Imaging   Unremarkable right upper quadrant ultrasound   04/01/2021 Genetic Testing   HRD status through [employer] was negative.  The report date is 04/01/2021. Negative hereditary cancer genetic testing: no pathogenic variants detected in [employer].  The report date is 04/04/2021.  The [facility] gene panel offered by [employer] includes sequencing and deletion/duplication testing of the following 48 genes: APC, ATM, AXIN2, BAP1, BARD1, BMPR1A, BRCA1, BRCA2, BRIP1, CHD1, CDK4, CDKN2A(p16 and p14ARF), CHEK2, CTNNA1, EGFR, EPCAM, FH, FLCN, GREM1, HOXB13,  MEN1, MET, MITF , MLH1, MSH2, MSH3, MSH6, MUTYH, NTHL1, PALB2, PMS2, POLD1, POLE, PTEN, RAD51C, RAD51D, RET, SDHA, SDHB, SDHC, SDHD, SMAD4, STK11,TERT, TP53, TSC1, TSC2, and VHL.   04/12/2021 Tumor Marker   Patient's tumor was tested for the following markers: CA-125. Results of the tumor marker test revealed 12.7.   06/13/2021 Imaging   1. No new mass or lymphadenopathy identified in the abdomen or pelvis. Interval resolution of previous omental soft tissue caking. 2. Other ancillary findings.   06/13/2021 Tumor Marker   Patient's tumor was tested for the following markers: CA-125. Results of the tumor marker test revealed 10.1.   07/11/2021 Tumor Marker   Patient's tumor was tested for the following markers: CA-125. Results of the tumor marker test revealed 9.4.   09/08/2021 Imaging   1. Status post hysterectomy and omentectomy. 2. No evidence of lymphadenopathy or metastatic disease in the abdomen or pelvis.     09/08/2021 Tumor Marker   Patient's tumor was tested for the following markers: CA-125. Results of the tumor marker test revealed 9.0.   02/22/2022 Imaging   IMPRESSION: 1. No acute findings.  No ascites, mass or adenopathy. 2.  Aortic Atherosclerosis (ICD10-170.0).   03/10/2022 Tumor Marker   Patient's tumor was tested for the following markers: CA-125. Results of the tumor marker test revealed 11.7.   06/09/2022 Tumor Marker   Patient's tumor was tested for the following markers: CA-125. Results of the tumor marker test revealed 29.2.   09/07/2022 Tumor Marker   Patient's tumor was tested for the following markers: CA-125. Results of the tumor marker test revealed 39.7.   09/15/2022  Imaging   CT ABDOMEN PELVIS W CONTRAST  Result Date: 09/15/2022 CLINICAL DATA:  History of ovarian carcinoma, elevated CA-125 EXAM: CT ABDOMEN AND PELVIS WITH CONTRAST TECHNIQUE: Multidetector CT imaging of the abdomen and pelvis was performed using the standard protocol following bolus  administration of intravenous contrast. RADIATION DOSE REDUCTION: This exam was performed according to the departmental dose-optimization program which includes automated exposure control, adjustment of the mA and/or kV according to patient size and/or use of iterative reconstruction technique. CONTRAST:  60mL OMNIPAQUE IOHEXOL 350 MG/ML SOLN COMPARISON:  06/22/2022 and previous FINDINGS: Lower chest: No pleural or pericardial effusion. Visualized lung bases grossly clear, with some motion degradation. Hepatobiliary: No focal liver abnormality is seen. No gallstones, gallbladder wall thickening, or biliary dilatation. Pancreas: Unremarkable. No pancreatic ductal dilatation or surrounding inflammatory changes. Spleen: Normal in size without focal abnormality. Accessory splenule. Adrenals/Urinary Tract: No adrenal mass. Symmetric renal parenchymal enhancement without urolithiasis or hydronephrosis. Urinary bladder incompletely distended. Stomach/Bowel: Stomach incompletely distended, unremarkable. Small bowel decompressed. Post appendectomy. Gas and fecal material partially distend the colon, without acute finding. A few scattered diverticula in the sigmoid segment. Vascular/Lymphatic: Minimal calcified aortic plaque without aneurysm or stenosis. Portal vein patent. A few scattered mesenteric lymph nodes none greater than 4 mm short axis diameter. No abdominal or pelvic adenopathy. Reproductive: Status post hysterectomy. No adnexal masses. No peritoneal nodularity. Other: No ascites. No free air. Bilateral pelvic phleboliths stable. Musculoskeletal: Stable lower lumbar spondylitic change with early grade and grade 1 anterolisthesis L4-5 and degenerative disc disease L5-S1. No fracture or worrisome bone lesion. IMPRESSION: 1. No acute findings. 2. No evidence of recurrent or metastatic disease. 3. Sigmoid diverticulosis. Electronically Signed   By: Corlis Leak M.D.   On: 09/15/2022 20:09      11/24/2022 Tumor Marker    Patient's tumor was tested for the following markers: CA-125. Results of the tumor marker test revealed 47.6.   12/04/2022 Imaging   CT A/P 1. A new 2.4 x 2.1 cm low attenuating structure in the right pelvis abutting the vaginal cuff concerning for recurrent disease, given rise in CA 125. Clinical correlation is recommended. 2. No bowel obstruction. Appendectomy. 3.  Aortic Atherosclerosis (ICD10-I70.0).     Interval History: Overall doing well, no significant change since her last visit with me.  Past Medical/Surgical History: Past Medical History:  Diagnosis Date   Anemia    Cancer (HCC)    Family history of breast cancer 03/14/2021   Hole, retinal    Right eye   Osteoporosis    Polyp of colon, unspecified part of colon, unspecified type    Benign    Past Surgical History:  Procedure Laterality Date   APPENDECTOMY  02/27/2007   lsc   BILATERAL SALPINGECTOMY Right 03/1978   right ectopic, tied other tube   CATARACT EXTRACTION Right 10/31/2011   Dr. Dione Booze   COLONOSCOPY  03/2019   Dr. Levora Angel   COLONOSCOPY WITH PROPOFOL N/A 04/01/2018   Procedure: COLONOSCOPY WITH PROPOFOL;  Surgeon: Kathi Der, MD;  Location: [facility];  Service: Gastroenterology;  Laterality: N/A;   DILATION AND CURETTAGE OF UTERUS     x3, 2 for miscarriages, one for polyp vs fibroid   HAND / FINGER LESION EXCISION Left    lt. index finger   LAPAROSCOPY N/A 11/11/2020   Procedure: LAPAROSCOPY DIAGNOSTIC WITH PERITONEAL BIOPSY;  Surgeon: [name], MD;  Location: [facility];  Service: Gynecology;  Laterality: N/A;   POLYPECTOMY  04/01/2018   Procedure: POLYPECTOMY;  Surgeon: Kathi Der, MD;  Location: [facility];  Service: Gastroenterology;;   RETINAL TEAR REPAIR CRYOTHERAPY Right 04/25/2011   Dr. Dwyane Dee INJECTION  04/01/2018   Procedure: SUBMUCOSAL INJECTION;  Surgeon: Kathi Der, MD;  Location: [facility];  Service: Gastroenterology;;   TUBAL  LIGATION Left 03/1978    Family History  Problem Relation Age of Onset   Lung cancer Mother 98       smoking hx   Skin cancer Father        dx after 50; non-melanoma; scalp   Lung cancer Maternal Aunt        dx after 50; smoking hx   Breast cancer Daughter 40       ER+   Skin cancer Cousin        face; surgery only   Ovarian cancer Neg Hx    Endometrial cancer Neg Hx    Prostate cancer Neg Hx    Pancreatic cancer Neg Hx    Colon cancer Neg Hx     Social History   Socioeconomic History   Marital status: Legally Separated    Spouse name: Not on file   Number of children: Not on file   Years of education: Not on file   Highest education level: Not on file  Occupational History   Not on file  Tobacco Use   Smoking status: Never    Passive exposure: Past (18 years)   Smokeless tobacco: Never  Vaping Use   Vaping status: Never Used  Substance and Sexual Activity   Alcohol use: Not Currently   Drug use: Never   Sexual activity: Not Currently  Other Topics Concern   Not on file  Social History Narrative   Not on file   Social Determinants of Health   Financial Resource Strain: Not on file  Food Insecurity: Not on file  Transportation Needs: Not on file  Physical Activity: Not on file  Stress: Not on file  Social Connections: Not on file    Current Medications:  Current Outpatient Medications:    bimatoprost (LATISSE) 0.03 % ophthalmic solution, Place into both eyes at bedtime. Place one drop on applicator and apply evenly along the skin of the upper eyelid at base of eyelashes once daily at bedtime; repeat procedure for second eye (use a clean applicator)., Disp: , Rfl:    [START ON 12/19/2022] bisacodyl (DULCOLAX) 5 MG EC tablet, Plan on taking 4 tablets at 7 am the day before surgery, Disp: 4 tablet, Rfl: 0   [START ON 12/19/2022] metroNIDAZOLE (FLAGYL) 500 MG tablet, The day before surgery, take two tablets (1000 mg) at 2 pm, 3 pm, and 10 pm., Disp: 6 tablet,  Rfl: 0   [START ON 12/19/2022] polyethylene glycol powder (MIRALAX) 17 GM/SCOOP powder, Starting at 10:00 am the day before surgery, mix the bottle of Miralax with the 64 ounces of Gatorade. Drink gradually (8 oz glass every 15 minutes) until gone. You should finish in 4 hours., Disp: 255 g, Rfl: 0   risedronate (ACTONEL) 150 MG tablet, Take 150 mg by mouth every 30 (thirty) days. with water on empty stomach, nothing by mouth or lie down for next 30 minutes., Disp: , Rfl:    senna-docusate (SENOKOT-S) 8.6-50 MG tablet, Take 2 tablets by mouth at bedtime. For AFTER surgery, do not take if having diarrhea, Disp: 30 tablet, Rfl: 0   traMADol (ULTRAM) 50 MG tablet, Take 1 tablet (50 mg total) by mouth every 6 (six) hours as  needed for severe pain. For AFTER surgery only, do not take and drive, Disp: 10 tablet, Rfl: 0   tretinoin (RETIN-A) 0.025 % cream, Apply 1 application  topically at bedtime., Disp: , Rfl:   Review of Systems: + slight abdominal pain Denies appetite changes, fevers, chills, fatigue, unexplained weight changes. Denies hearing loss, neck lumps or masses, mouth sores, ringing in ears or voice changes. Denies cough or wheezing.  Denies shortness of breath. Denies chest pain or palpitations. Denies leg swelling. Denies abdominal distention, blood in stools, constipation, diarrhea, nausea, vomiting, or early satiety. Denies pain with intercourse, dysuria, frequency, hematuria or incontinence. Denies hot flashes, pelvic pain, vaginal bleeding or vaginal discharge.   Denies joint pain, back pain or muscle pain/cramps. Denies itching, rash, or wounds. Denies dizziness, headaches, numbness or seizures. Denies swollen lymph nodes or glands, denies easy bruising or bleeding. Denies anxiety, depression, confusion, or decreased concentration.  Physical Exam: BP (!) 127/50 (BP Location: Left Arm, Patient Position: Sitting)   Pulse 87   Temp 98.5 F (36.9 C) (Oral)   Resp 18   Ht 5\' 4"   (1.626 m)   Wt 113 lb 8 oz (51.5 kg)   SpO2 96%   BMI 19.48 kg/m  General: Alert, oriented, no acute distress. HEENT: Normocephalic, atraumatic, sclera anicteric. Pulmonary: Unlabored breathing on room air.  Laboratory & Radiologic Studies: CT A/P on 12/04/22: 1. A new 2.4 x 2.1 cm low attenuating structure in the right pelvis abutting the vaginal cuff concerning for recurrent disease, given rise in CA 125. Clinical correlation is recommended. 2. No bowel obstruction. Appendectomy. 3.  Aortic Atherosclerosis (ICD10-I70.0).  Component Ref Range & Units 2 wk ago (11/22/22) 3 mo ago (09/05/22) 6 mo ago (06/07/22) 9 mo ago (03/08/22) 1 yr ago (11/23/21) 1 yr ago (09/07/21) 1 yr ago (07/08/21)  Cancer Antigen (CA) 125 0.0 - 38.1 U/mL 47.6 High  39.7 High  CM 29.2 CM 11.7 CM 9.3 CM 9.0 CM 9.4 CM   Assessment & Plan: Robin Sellers is a 72 y.o. woman with recurrent platinum-sensitive primary peritoneal cancer.  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 Genetic testing was negative.  Patient continues to do well.  Had previous phone discussion with her about CT results which show what looks like a solitary focus of recurrence in the right perirectal tissue.  There is a good plane between the mass and the rectum.  We have discussed the possibility of needing bowel surgery if the mass compromises the mesentery in this area or is attached to the rectum itself.  Also discussed proximity of 2 loops of small bowel although I suspect that these are just adjacent to the mass due to their transient location in the pelvis.  We discussed plan for diagnostic laparoscopy.  If multifocal disease that is not resectable noted at the time of laparoscopy, plan will be to abort attempt at debulking surgery.  We reviewed the plan for a diagnostic laparoscopy, robotic  versus open pelvic mass excision, possible bowel surgery, any other indicated procedures. The risks of surgery were discussed in detail and she understands these to include infection; wound separation; hernia; vaginal cuff separation, injury to adjacent organs such as bowel, bladder, blood vessels, ureters and nerves; bleeding which may require blood transfusion; anesthesia risk; thromboembolic events; possible death; unforeseen complications; possible need for re-exploration; medical complications such as heart attack, stroke, pleural effusion  and pneumonia. The patient will receive DVT and antibiotic prophylaxis as indicated. She voiced a clear understanding. She had the opportunity to ask questions. Perioperative instructions were reviewed with her. Prescriptions for post-op medications were sent to her pharmacy of choice.  Discussed need for 2 versus 4 weeks of prophylactic anticoagulation after surgery depending on whether this is minimally invasive or open surgery.  28 minutes of total time was spent for this patient encounter, including preparation, face-to-face counseling with the patient and coordination of care, and documentation of the encounter.  Eugene Garnet, MD  Division of Gynecologic Oncology  Department of Obstetrics and Gynecology  [facility]

## 2022-12-08 NOTE — Telephone Encounter (Signed)
Received call from [facility] pharmacy requesting to fill Dulcolax today. Patient surgery not scheduled till 8/28 so medication not to be started until the day before surgery. Ok to fill prescription today so patient does not have to return to pharmacy.

## 2022-12-08 NOTE — H&P (View-Only) (Signed)
 Gynecologic Oncology Return Clinic Visit  12/08/22  Reason for Visit: treatment planning  Treatment History: Oncology History Overview Note  Neg genetics   Primary peritoneal carcinomatosis (HCC)  08/21/2020 Initial Diagnosis   The patient reports intermittent right upper quadrant pain that feel like she pulled a muscle beginning in March 2022.  In April, she woke up with sharp pain in her right upper quadrant.  This was her first episode of sharp pain.  She thought that her pain was related to her gallbladder and presented to the emergency department.  She was seen in ED on 4/30. RUQ ultrasound and x-ray were normal as were LFTs and lipase.    10/27/2020 Imaging   Findings highly suspicious for peritoneal carcinomatosis. No site of primary malignancy identified.   Colonic diverticulosis, without radiographic evidence of diverticulitis   11/02/2020 Initial Diagnosis   Primary peritoneal carcinomatosis (HCC)   11/02/2020 Tumor Marker   Patient's tumor was tested for the following markers: CA-125. Results of the tumor marker test revealed 88.   11/11/2020 Pathology Results   FINAL MICROSCOPIC DIAGNOSIS:   A. CUL DE SAC, LEFT ANTERIOR, EXCISION:  -  Poorly differentiated carcinoma  -  See comment   B. SIDEWALL, RIGHT, BIOPSY:  -  Poorly differentiated carcinoma  -  See comment   COMMENT:   By immunohistochemistry, the neoplastic cells are positive for cytokeratin 7, p53, PAX8 and WT1 but negative for cytokeratin 20, GATA3, CDX2 and TTF-1.  Overall, the immunophenotype is consistent with a  gynecologic primary.    11/11/2020 Pathology Results   FINAL MICROSCOPIC DIAGNOSIS:  - Malignant cells consistent with metastatic adenocarcinoma    11/11/2020 Surgery   Pre-operative Diagnosis: Carcinomatosis, mildly elevated CA-125   Post-operative Diagnosis: same, At least stage IIIC gyn malignancy   Operation: Diagnostic laparoscopy, peritoneal biopsies    Surgeon: Eugene Garnet  MD Operative Findings: On EUA, small mobile uterus. On intra-abdominal entry, carcinomatosis appreciated studding the right anterior diaphragm, bilateral liver surfaces, mesentery, pelvic peritoneum. Omental cake with measuring up to 2x3cm. Right ovary adherent to the right uterus with peritoneal studding adjacent. Frondular implants noted along right pelvic sidewall. Miliary disease along anterior cul de sac. Cul de sac covered with friable miliary disease. Small volume dark amber ascites. Specimens: left anterior cul de sac peritoneum, right pelvic sidewall peritoneal biopsy          11/15/2020 Cancer Staging   Staging form: Ovary, Fallopian Tube, and Primary Peritoneal Carcinoma, AJCC 8th Edition - Clinical stage from 11/15/2020: FIGO Stage III (cT3, cN0, cM0) - Signed by Artis Delay, MD on 11/15/2020 Stage prefix: Initial diagnosis   11/26/2020 - 05/13/2021 Chemotherapy   Patient is on Treatment Plan : OVARIAN Carboplatin (AUC 6) / Paclitaxel (175) q21d x 6 cycles     11/26/2020 Tumor Marker   Patient's tumor was tested for the following markers: CA-125. Results of the tumor marker test revealed 76.8.   12/17/2020 Tumor Marker   Patient's tumor was tested for the following markers: CA-125. Results of the tumor marker test revealed 58.1.   01/07/2021 Tumor Marker   Patient's tumor was tested for the following markers: CA-125. Results of the tumor marker test revealed 21.6.   01/20/2021 Imaging   Decreased omental soft tissue nodularity and caking, consistent with interval improvement in carcinoma.   No evidence of new or progressive disease within the abdomen or pelvis.   Colonic diverticulosis. No radiographic evidence of diverticulitis.   Large stool burden noted; recommend clinical correlation for  possible constipation   02/24/2021 Surgery   Robotic-assisted laparoscopic total hysterectomy with bilateral salpingoophorectomy, peritoneal stripping, mini-lap for omentectomy and  intra-abdominal palpation  Findings: On EUA, small mobile uterus. Rectum free. On intra-abdominal entry, normal upper abdominal survey including liver edge, diaphragm and stomach. Omentum with several small (<2cm) areas of thickening, suspected treated tumor versus residual tumor. Uterus 6cm and normal in appearance. Atrophic appearing bilateral adnexa. Some adhesions of the left ovary to the medial leaf of the broad ligament. Minimal peritoneal nodularity versus treated disease within posterior cul de sac and deep left pelvis, all either excised or ablated. No significant peritoneal disease or carcinomatosis. No sigmoid or rectal involvement. Small bowel normal in appearance. No adenopathy. No ascites. R0 resection.    02/24/2021 Pathology Results   A. PERTIONEAL SCAR, EXCISION:  - Microscopic focus of high-grade serous carcinoma   B. 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 OMENTUM, RESECTION:  - Microscopic foci of high-grade serous carcinoma   03/14/2021 Tumor Marker   Patient's tumor was tested for the following markers: CA-125. Results of the tumor marker test revealed 32.7.   03/23/2021 Imaging   Unremarkable right upper quadrant ultrasound   04/01/2021 Genetic Testing   HRD status through [employer] was negative.  The report date is 04/01/2021. Negative hereditary cancer genetic testing: no pathogenic variants detected in [employer].  The report date is 04/04/2021.  The [facility] gene panel offered by [employer] includes sequencing and deletion/duplication testing of the following 48 genes: APC, ATM, AXIN2, BAP1, BARD1, BMPR1A, BRCA1, BRCA2, BRIP1, CHD1, CDK4, CDKN2A(p16 and p14ARF), CHEK2, CTNNA1, EGFR, EPCAM, FH, FLCN, GREM1, HOXB13,  MEN1, MET, MITF , MLH1, MSH2, MSH3, MSH6, MUTYH, NTHL1, PALB2, PMS2, POLD1, POLE, PTEN, RAD51C, RAD51D, RET, SDHA, SDHB, SDHC, SDHD, SMAD4, STK11,TERT, TP53, TSC1, TSC2, and VHL.   04/12/2021 Tumor Marker   Patient's tumor was tested for the following markers: CA-125. Results of the tumor marker test revealed 12.7.   06/13/2021 Imaging   1. No new mass or lymphadenopathy identified in the abdomen or pelvis. Interval resolution of previous omental soft tissue caking. 2. Other ancillary findings.   06/13/2021 Tumor Marker   Patient's tumor was tested for the following markers: CA-125. Results of the tumor marker test revealed 10.1.   07/11/2021 Tumor Marker   Patient's tumor was tested for the following markers: CA-125. Results of the tumor marker test revealed 9.4.   09/08/2021 Imaging   1. Status post hysterectomy and omentectomy. 2. No evidence of lymphadenopathy or metastatic disease in the abdomen or pelvis.     09/08/2021 Tumor Marker   Patient's tumor was tested for the following markers: CA-125. Results of the tumor marker test revealed 9.0.   02/22/2022 Imaging   IMPRESSION: 1. No acute findings.  No ascites, mass or adenopathy. 2.  Aortic Atherosclerosis (ICD10-170.0).   03/10/2022 Tumor Marker   Patient's tumor was tested for the following markers: CA-125. Results of the tumor marker test revealed 11.7.   06/09/2022 Tumor Marker   Patient's tumor was tested for the following markers: CA-125. Results of the tumor marker test revealed 29.2.   09/07/2022 Tumor Marker   Patient's tumor was tested for the following markers: CA-125. Results of the tumor marker test revealed 39.7.   09/15/2022  Imaging   CT ABDOMEN PELVIS W CONTRAST  Result Date: 09/15/2022 CLINICAL DATA:  History of ovarian carcinoma, elevated CA-125 EXAM: CT ABDOMEN AND PELVIS WITH CONTRAST TECHNIQUE: Multidetector CT imaging of the abdomen and pelvis was performed using the standard protocol following bolus  administration of intravenous contrast. RADIATION DOSE REDUCTION: This exam was performed according to the departmental dose-optimization program which includes automated exposure control, adjustment of the mA and/or kV according to patient size and/or use of iterative reconstruction technique. CONTRAST:  60mL OMNIPAQUE IOHEXOL 350 MG/ML SOLN COMPARISON:  06/22/2022 and previous FINDINGS: Lower chest: No pleural or pericardial effusion. Visualized lung bases grossly clear, with some motion degradation. Hepatobiliary: No focal liver abnormality is seen. No gallstones, gallbladder wall thickening, or biliary dilatation. Pancreas: Unremarkable. No pancreatic ductal dilatation or surrounding inflammatory changes. Spleen: Normal in size without focal abnormality. Accessory splenule. Adrenals/Urinary Tract: No adrenal mass. Symmetric renal parenchymal enhancement without urolithiasis or hydronephrosis. Urinary bladder incompletely distended. Stomach/Bowel: Stomach incompletely distended, unremarkable. Small bowel decompressed. Post appendectomy. Gas and fecal material partially distend the colon, without acute finding. A few scattered diverticula in the sigmoid segment. Vascular/Lymphatic: Minimal calcified aortic plaque without aneurysm or stenosis. Portal vein patent. A few scattered mesenteric lymph nodes none greater than 4 mm short axis diameter. No abdominal or pelvic adenopathy. Reproductive: Status post hysterectomy. No adnexal masses. No peritoneal nodularity. Other: No ascites. No free air. Bilateral pelvic phleboliths stable. Musculoskeletal: Stable lower lumbar spondylitic change with early grade and grade 1 anterolisthesis L4-5 and degenerative disc disease L5-S1. No fracture or worrisome bone lesion. IMPRESSION: 1. No acute findings. 2. No evidence of recurrent or metastatic disease. 3. Sigmoid diverticulosis. Electronically Signed   By: Corlis Leak M.D.   On: 09/15/2022 20:09      11/24/2022 Tumor Marker    Patient's tumor was tested for the following markers: CA-125. Results of the tumor marker test revealed 47.6.   12/04/2022 Imaging   CT A/P 1. A new 2.4 x 2.1 cm low attenuating structure in the right pelvis abutting the vaginal cuff concerning for recurrent disease, given rise in CA 125. Clinical correlation is recommended. 2. No bowel obstruction. Appendectomy. 3.  Aortic Atherosclerosis (ICD10-I70.0).     Interval History: Overall doing well, no significant change since her last visit with me.  Past Medical/Surgical History: Past Medical History:  Diagnosis Date   Anemia    Cancer (HCC)    Family history of breast cancer 03/14/2021   Hole, retinal    Right eye   Osteoporosis    Polyp of colon, unspecified part of colon, unspecified type    Benign    Past Surgical History:  Procedure Laterality Date   APPENDECTOMY  02/27/2007   [facility]sc   BILATERAL SALPINGECTOMY Right 03/1978   right ectopic, tied other tube   CATARACT EXTRACTION Right 10/31/2011   Dr. Dione Booze   COLONOSCOPY  03/2019   Dr. Levora Angel   COLONOSCOPY WITH PROPOFOL N/A 04/01/2018   Procedure: COLONOSCOPY WITH PROPOFOL;  Surgeon: Kathi Der, MD;  Location: [facility];  Service: Gastroenterology;  Laterality: N/A;   DILATION AND CURETTAGE OF UTERUS     x3, 2 for miscarriages, one for polyp vs fibroid   HAND / FINGER LESION EXCISION Left    lt. index finger   LAPAROSCOPY N/A 11/11/2020   Procedure: LAPAROSCOPY DIAGNOSTIC WITH PERITONEAL BIOPSY;  Surgeon: [name], MD;  Location: [facility];  Service: Gynecology;  Laterality: N/A;   POLYPECTOMY  04/01/2018   Procedure: POLYPECTOMY;  Surgeon: Kathi Der, MD;  Location: [facility];  Service: Gastroenterology;;   RETINAL TEAR REPAIR CRYOTHERAPY Right 04/25/2011   Dr. Dwyane Dee INJECTION  04/01/2018   Procedure: SUBMUCOSAL INJECTION;  Surgeon: Kathi Der, MD;  Location: [facility];  Service: Gastroenterology;;   TUBAL  LIGATION Left 03/1978    Family History  Problem Relation Age of Onset   Lung cancer Mother 98       smoking hx   Skin cancer Father        dx after 50; non-melanoma; scalp   Lung cancer Maternal Aunt        dx after 50; smoking hx   Breast cancer Daughter 40       ER+   Skin cancer Cousin        face; surgery only   Ovarian cancer Neg Hx    Endometrial cancer Neg Hx    Prostate cancer Neg Hx    Pancreatic cancer Neg Hx    Colon cancer Neg Hx     Social History   Socioeconomic History   Marital status: Legally Separated    Spouse name: Not on file   Number of children: Not on file   Years of education: Not on file   Highest education level: Not on file  Occupational History   Not on file  Tobacco Use   Smoking status: Never    Passive exposure: Past (18 years)   Smokeless tobacco: Never  Vaping Use   Vaping status: Never Used  Substance and Sexual Activity   Alcohol use: Not Currently   Drug use: Never   Sexual activity: Not Currently  Other Topics Concern   Not on file  Social History Narrative   Not on file   Social Determinants of Health   Financial Resource Strain: Not on file  Food Insecurity: Not on file  Transportation Needs: Not on file  Physical Activity: Not on file  Stress: Not on file  Social Connections: Not on file    Current Medications:  Current Outpatient Medications:    bimatoprost (LATISSE) 0.03 % ophthalmic solution, Place into both eyes at bedtime. Place one drop on applicator and apply evenly along the skin of the upper eyelid at base of eyelashes once daily at bedtime; repeat procedure for second eye (use a clean applicator)., Disp: , Rfl:    [START ON 12/19/2022] bisacodyl (DULCOLAX) 5 MG EC tablet, Plan on taking 4 tablets at 7 am the day before surgery, Disp: 4 tablet, Rfl: 0   [START ON 12/19/2022] metroNIDAZOLE (FLAGYL) 500 MG tablet, The day before surgery, take two tablets (1000 mg) at 2 pm, 3 pm, and 10 pm., Disp: 6 tablet,  Rfl: 0   [START ON 12/19/2022] polyethylene glycol powder (MIRALAX) 17 GM/SCOOP powder, Starting at 10:00 am the day before surgery, mix the bottle of Miralax with the 64 ounces of Gatorade. Drink gradually (8 oz glass every 15 minutes) until gone. You should finish in 4 hours., Disp: 255 g, Rfl: 0   risedronate (ACTONEL) 150 MG tablet, Take 150 mg by mouth every 30 (thirty) days. with water on empty stomach, nothing by mouth or lie down for next 30 minutes., Disp: , Rfl:    senna-docusate (SENOKOT-S) 8.6-50 MG tablet, Take 2 tablets by mouth at bedtime. For AFTER surgery, do not take if having diarrhea, Disp: 30 tablet, Rfl: 0   traMADol (ULTRAM) 50 MG tablet, Take 1 tablet (50 mg total) by mouth every 6 (six) hours as  needed for severe pain. For AFTER surgery only, do not take and drive, Disp: 10 tablet, Rfl: 0   tretinoin (RETIN-A) 0.025 % cream, Apply 1 application  topically at bedtime., Disp: , Rfl:   Review of Systems: + slight abdominal pain Denies appetite changes, fevers, chills, fatigue, unexplained weight changes. Denies hearing loss, neck lumps or masses, mouth sores, ringing in ears or voice changes. Denies cough or wheezing.  Denies shortness of breath. Denies chest pain or palpitations. Denies leg swelling. Denies abdominal distention, blood in stools, constipation, diarrhea, nausea, vomiting, or early satiety. Denies pain with intercourse, dysuria, frequency, hematuria or incontinence. Denies hot flashes, pelvic pain, vaginal bleeding or vaginal discharge.   Denies joint pain, back pain or muscle pain/cramps. Denies itching, rash, or wounds. Denies dizziness, headaches, numbness or seizures. Denies swollen lymph nodes or glands, denies easy bruising or bleeding. Denies anxiety, depression, confusion, or decreased concentration.  Physical Exam: BP (!) 127/50 (BP Location: Left Arm, Patient Position: Sitting)   Pulse 87   Temp 98.5 F (36.9 C) (Oral)   Resp 18   Ht 5\' 4"   (1.626 m)   Wt 113 lb 8 oz (51.5 kg)   SpO2 96%   BMI 19.48 kg/m  General: Alert, oriented, no acute distress. HEENT: Normocephalic, atraumatic, sclera anicteric. Pulmonary: Unlabored breathing on room air.  Laboratory & Radiologic Studies: CT A/P on 12/04/22: 1. A new 2.4 x 2.1 cm low attenuating structure in the right pelvis abutting the vaginal cuff concerning for recurrent disease, given rise in CA 125. Clinical correlation is recommended. 2. No bowel obstruction. Appendectomy. 3.  Aortic Atherosclerosis (ICD10-I70.0).  Component Ref Range & Units 2 wk ago (11/22/22) 3 mo ago (09/05/22) 6 mo ago (06/07/22) 9 mo ago (03/08/22) 1 yr ago (11/23/21) 1 yr ago (09/07/21) 1 yr ago (07/08/21)  Cancer Antigen (CA) 125 0.0 - 38.1 U/mL 47.6 High  39.7 High  CM 29.2 CM 11.7 CM 9.3 CM 9.0 CM 9.4 CM   Assessment & Plan: Robin Sellers is a 72 y.o. woman with recurrent platinum-sensitive primary peritoneal cancer.  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 Genetic testing was negative.  Patient continues to do well.  Had previous phone discussion with her about CT results which show what looks like a solitary focus of recurrence in the right perirectal tissue.  There is a good plane between the mass and the rectum.  We have discussed the possibility of needing bowel surgery if the mass compromises the mesentery in this area or is attached to the rectum itself.  Also discussed proximity of 2 loops of small bowel although I suspect that these are just adjacent to the mass due to their transient location in the pelvis.  We discussed plan for diagnostic laparoscopy.  If multifocal disease that is not resectable noted at the time of laparoscopy, plan will be to abort attempt at debulking surgery.  We reviewed the plan for a diagnostic laparoscopy, robotic  versus open pelvic mass excision, possible bowel surgery, any other indicated procedures. The risks of surgery were discussed in detail and she understands these to include infection; wound separation; hernia; vaginal cuff separation, injury to adjacent organs such as bowel, bladder, blood vessels, ureters and nerves; bleeding which may require blood transfusion; anesthesia risk; thromboembolic events; possible death; unforeseen complications; possible need for re-exploration; medical complications such as heart attack, stroke, pleural effusion  and pneumonia. The patient will receive DVT and antibiotic prophylaxis as indicated. She voiced a clear understanding. She had the opportunity to ask questions. Perioperative instructions were reviewed with her. Prescriptions for post-op medications were sent to her pharmacy of choice.  Discussed need for 2 versus 4 weeks of prophylactic anticoagulation after surgery depending on whether this is minimally invasive or open surgery.  28 minutes of total time was spent for this patient encounter, including preparation, face-to-face counseling with the patient and coordination of care, and documentation of the encounter.  Eugene Garnet, MD  Division of Gynecologic Oncology  Department of Obstetrics and Gynecology  [facility]

## 2022-12-11 NOTE — Patient Instructions (Signed)
SURGICAL WAITING ROOM VISITATION  Patients having surgery or a procedure may have no more than 2 support people in the waiting area - these visitors may rotate.    Children under the age of 29 must have an adult with them who is not the patient.  Due to an increase in RSV and influenza rates and associated hospitalizations, children ages 33 and under may not visit patients in [facility] hospitals.  If the patient needs to stay at the hospital during part of their recovery, the visitor guidelines for inpatient rooms apply. Pre-op nurse will coordinate an appropriate time for 1 support person to accompany patient in pre-op.  This support person may not rotate.    Please refer to the [facility] website for the visitor guidelines for Inpatients (after your surgery is over and you are in a regular room).       Your procedure is scheduled on:  12/20/22    Report to [facility] Main Entrance    Report to admitting at  0930 AM   Call this number if you have problems the morning of surgery [phone]            Follow bowel PRep per MD.     After Midnight you may have the following liquids until ___ 0830___ AM DAY OF SURGERY  Water Non-Citrus Juices (without pulp, NO RED-Apple, White grape, White cranberry) Black Coffee (NO MILK/CREAM OR CREAMERS, sugar ok)  Clear Tea (NO MILK/CREAM OR CREAMERS, sugar ok) regular and decaf                             Plain Jell-O (NO RED)                                           Fruit ices (not with fruit pulp, NO RED)                                     Popsicles (NO RED)                                                               Sports drinks like Gatorade (NO RED)              Drink 2 Ensure/G2 drinks AT 10:00 PM the night before surgery.        The day of surgery:  Drink ONE (1) Pre-Surgery Clear Ensure or G2 at   0830AM  ( have completed by ) the morning of surgery. Drink in one sitting. Do not sip.  This drink was given to you  during your hospital  pre-op appointment visit. Nothing else to drink after completing the  Pre-Surgery Clear Ensure or G2.          If you have questions, please contact your surgeon's office.   FOLLOW BOWEL PREP AND ANY ADDITIONAL PRE OP INSTRUCTIONS YOU RECEIVED FROM YOUR SURGEON'S OFFICE!!!     Oral Hygiene is also important to reduce your risk of infection.  Remember - BRUSH YOUR TEETH THE MORNING OF SURGERY WITH YOUR REGULAR TOOTHPASTE  DENTURES WILL BE REMOVED PRIOR TO SURGERY PLEASE DO NOT APPLY "Poly grip" OR ADHESIVES!!!   Do NOT smoke after Midnight   Stop all vitamins and herbal supplements 7 days before surgery.   Take these medicines the morning of surgery with A SIP OF WATER:  none   DO NOT TAKE ANY ORAL DIABETIC MEDICATIONS DAY OF YOUR SURGERY  Bring CPAP mask and tubing day of surgery.                              You may not have any metal on your body including hair pins, jewelry, and body piercing             Do not wear make-up, lotions, powders, perfumes/cologne, or deodorant  Do not wear nail polish including gel and S&S, artificial/acrylic nails, or any other type of covering on natural nails including finger and toenails. If you have artificial nails, gel coating, etc. that needs to be removed by a nail salon please have this removed prior to surgery or surgery may need to be canceled/ delayed if the surgeon/ anesthesia feels like they are unable to be safely monitored.   Do not shave  48 hours prior to surgery.               Men may shave face and neck.   Do not bring valuables to the hospital. [place] IS NOT             RESPONSIBLE   FOR VALUABLES.   Contacts, glasses, dentures or bridgework may not be worn into surgery.   Bring small overnight bag day of surgery.   DO NOT BRING YOUR HOME MEDICATIONS TO THE HOSPITAL. PHARMACY WILL DISPENSE MEDICATIONS LISTED ON YOUR MEDICATION LIST TO YOU DURING YOUR  ADMISSION IN THE HOSPITAL!    Patients discharged on the day of surgery will not be allowed to drive home.  Someone NEEDS to stay with you for the first 24 hours after anesthesia.   Special Instructions: Bring a copy of your healthcare power of attorney and living will documents the day of surgery if you haven't scanned them before.              Please read over the following fact sheets you were given: IF YOU HAVE QUESTIONS ABOUT YOUR PRE-OP INSTRUCTIONS PLEASE CALL [phone]   If you received a COVID test during your pre-op visit  it is requested that you wear a mask when out in public, stay away from anyone that may not be feeling well and notify your surgeon if you develop symptoms. If you test positive for Covid or have been in contact with anyone that has tested positive in the last 10 days please notify you surgeon.     - Preparing for Surgery Before surgery, you can play an important role.  Because skin is not sterile, your skin needs to be as free of germs as possible.  You can reduce the number of germs on your skin by washing with CHG (chlorahexidine gluconate) soap before surgery.  CHG is an antiseptic cleaner which kills germs and bonds with the skin to continue killing germs even after washing. Please DO NOT use if you have an allergy to CHG or antibacterial soaps.  If your skin becomes reddened/irritated stop using the CHG and inform your nurse when you  arrive at Short Stay. Do not shave (including legs and underarms) for at least 48 hours prior to the first CHG shower.  You may shave your face/neck. Please follow these instructions carefully:  1.  Shower with CHG Soap the night before surgery and the  morning of Surgery.  2.  If you choose to wash your hair, wash your hair first as usual with your  normal  shampoo.  3.  After you shampoo, rinse your hair and body thoroughly to remove the  shampoo.                           4.  Use CHG as you would any other liquid  soap.  You can apply chg directly  to the skin and wash                       Gently with a scrungie or clean washcloth.  5.  Apply the CHG Soap to your body ONLY FROM THE NECK DOWN.   Do not use on face/ open                           Wound or open sores. Avoid contact with eyes, ears mouth and genitals (private parts).                       Wash face,  Genitals (private parts) with your normal soap.             6.  Wash thoroughly, paying special attention to the area where your surgery  will be performed.  7.  Thoroughly rinse your body with warm water from the neck down.  8.  DO NOT shower/wash with your normal soap after using and rinsing off  the CHG Soap.                9.  Pat yourself dry with a clean towel.            10.  Wear clean pajamas.            11.  Place clean sheets on your bed the night of your first shower and do not  sleep with pets. Day of Surgery : Do not apply any lotions/deodorants the morning of surgery.  Please wear clean clothes to the hospital/surgery center.  FAILURE TO FOLLOW THESE INSTRUCTIONS MAY RESULT IN THE CANCELLATION OF YOUR SURGERY PATIENT SIGNATURE_________________________________  NURSE SIGNATURE__________________________________  ________________________________________________________________________

## 2022-12-12 ENCOUNTER — Encounter (HOSPITAL_COMMUNITY): Payer: Self-pay

## 2022-12-12 ENCOUNTER — Other Ambulatory Visit: Payer: Self-pay

## 2022-12-12 ENCOUNTER — Encounter (HOSPITAL_COMMUNITY)
Admission: RE | Admit: 2022-12-12 | Discharge: 2022-12-12 | Disposition: A | Discharge status: Home / Self Care | Payer: Medicare Other | Source: Ambulatory Visit | Attending: Gynecologic Oncology | Admitting: Gynecologic Oncology

## 2022-12-12 ENCOUNTER — Encounter: Payer: Self-pay | Admitting: Gynecologic Oncology

## 2022-12-12 VITALS — BP 132/69 | HR 66 | Temp 98.2°F | Resp 16 | Ht 64.0 in | Wt 114.0 lb

## 2022-12-12 DIAGNOSIS — C563 Malignant neoplasm of bilateral ovaries: Secondary | ICD-10-CM | POA: Diagnosis not present

## 2022-12-12 DIAGNOSIS — Z01812 Encounter for preprocedural laboratory examination: Secondary | ICD-10-CM | POA: Insufficient documentation

## 2022-12-12 DIAGNOSIS — Z01818 Encounter for other preprocedural examination: Secondary | ICD-10-CM

## 2022-12-12 NOTE — Progress Notes (Signed)
Patient here for a pre-operative appointment prior to her scheduled surgery on December 20, 2022. She is scheduled for diagnostic laparoscopy, possible robotic assisted laparoscopic versus open pelvic mass excision, possible bowel surgery. The surgery was discussed in detail.  See after visit summary for additional details.   Discussed post-op pain management in detail including the aspects of the enhanced recovery pathway.  Advised her that a new prescription would be sent in for tramadol and it is only to be used for after her upcoming surgery.  We discussed the use of tylenol post-op and to monitor for a maximum of 4,000 mg in a 24 hour period.  Also prescribed sennakot to be used after surgery and to hold if having loose stools.  Discussed bowel regimen in detail.     Discussed measures to take at home to prevent DVT including frequent mobility.  Reportable signs and symptoms of DVT discussed. Post-operative instructions discussed and expectations for after surgery. Incisional care discussed as well including reportable signs and symptoms including erythema, drainage, wound separation.     10 minutes spent with the patient/preparing information for visit.  Verbalizing understanding of material discussed. No needs or concerns voiced at the end of the visit.   Advised patient to call for any needs.  Advised that her post-operative medications had been prescribed and could be picked up at any time.    This appointment is included in the global surgical bundle as pre-operative teaching and has no charge.

## 2022-12-19 ENCOUNTER — Encounter: Payer: Self-pay | Admitting: Hematology and Oncology

## 2022-12-19 ENCOUNTER — Telehealth: Payer: Self-pay | Admitting: *Deleted

## 2022-12-19 NOTE — Telephone Encounter (Signed)
Telephone call to check on pre-operative status.  Patient compliant with pre-operative instructions.  Reinforced nothing to eat after midnight. Clear liquids until 0915. Patient to arrive at 1015.  No questions or concerns voiced.  Instructed to call for any needs.  

## 2022-12-20 ENCOUNTER — Encounter (HOSPITAL_COMMUNITY): Payer: Self-pay | Admitting: Gynecologic Oncology

## 2022-12-20 ENCOUNTER — Other Ambulatory Visit: Payer: Self-pay

## 2022-12-20 ENCOUNTER — Encounter (HOSPITAL_COMMUNITY)
Admission: RE | Disposition: A | Discharge status: Home / Self Care | Payer: Self-pay | Source: Home / Self Care | Attending: Gynecologic Oncology

## 2022-12-20 ENCOUNTER — Inpatient Hospital Stay (HOSPITAL_COMMUNITY)
Admission: RE | Admit: 2022-12-20 | Discharge: 2022-12-22 | DRG: 746 | Disposition: A | Discharge status: Home / Self Care | Payer: Medicare Other | Attending: Gynecologic Oncology | Admitting: Gynecologic Oncology

## 2022-12-20 ENCOUNTER — Inpatient Hospital Stay (HOSPITAL_COMMUNITY): Payer: Medicare Other | Admitting: Certified Registered"

## 2022-12-20 ENCOUNTER — Encounter: Payer: Medicare Other | Admitting: Gynecologic Oncology

## 2022-12-20 ENCOUNTER — Inpatient Hospital Stay (HOSPITAL_COMMUNITY): Payer: Medicare Other | Admitting: Physician Assistant

## 2022-12-20 DIAGNOSIS — Z808 Family history of malignant neoplasm of other organs or systems: Secondary | ICD-10-CM | POA: Diagnosis not present

## 2022-12-20 DIAGNOSIS — E871 Hypo-osmolality and hyponatremia: Secondary | ICD-10-CM | POA: Diagnosis present

## 2022-12-20 DIAGNOSIS — Z8543 Personal history of malignant neoplasm of ovary: Secondary | ICD-10-CM

## 2022-12-20 DIAGNOSIS — C569 Malignant neoplasm of unspecified ovary: Principal | ICD-10-CM | POA: Diagnosis present

## 2022-12-20 DIAGNOSIS — Z9079 Acquired absence of other genital organ(s): Secondary | ICD-10-CM

## 2022-12-20 DIAGNOSIS — C563 Malignant neoplasm of bilateral ovaries: Principal | ICD-10-CM

## 2022-12-20 DIAGNOSIS — Z90722 Acquired absence of ovaries, bilateral: Secondary | ICD-10-CM | POA: Diagnosis not present

## 2022-12-20 DIAGNOSIS — Z801 Family history of malignant neoplasm of trachea, bronchus and lung: Secondary | ICD-10-CM

## 2022-12-20 DIAGNOSIS — Z91018 Allergy to other foods: Secondary | ICD-10-CM | POA: Diagnosis not present

## 2022-12-20 DIAGNOSIS — Z8589 Personal history of malignant neoplasm of other organs and systems: Secondary | ICD-10-CM

## 2022-12-20 DIAGNOSIS — Z01818 Encounter for other preprocedural examination: Secondary | ICD-10-CM

## 2022-12-20 DIAGNOSIS — C7982 Secondary malignant neoplasm of genital organs: Secondary | ICD-10-CM | POA: Diagnosis not present

## 2022-12-20 DIAGNOSIS — C785 Secondary malignant neoplasm of large intestine and rectum: Secondary | ICD-10-CM

## 2022-12-20 DIAGNOSIS — Z86010 Personal history of colonic polyps: Secondary | ICD-10-CM | POA: Diagnosis not present

## 2022-12-20 DIAGNOSIS — C7989 Secondary malignant neoplasm of other specified sites: Secondary | ICD-10-CM | POA: Diagnosis not present

## 2022-12-20 DIAGNOSIS — D72829 Elevated white blood cell count, unspecified: Secondary | ICD-10-CM | POA: Diagnosis not present

## 2022-12-20 DIAGNOSIS — M810 Age-related osteoporosis without current pathological fracture: Secondary | ICD-10-CM | POA: Diagnosis present

## 2022-12-20 DIAGNOSIS — Z803 Family history of malignant neoplasm of breast: Secondary | ICD-10-CM

## 2022-12-20 DIAGNOSIS — M8000XA Age-related osteoporosis with current pathological fracture, unspecified site, initial encounter for fracture: Secondary | ICD-10-CM

## 2022-12-20 DIAGNOSIS — Z90710 Acquired absence of both cervix and uterus: Secondary | ICD-10-CM

## 2022-12-20 HISTORY — PX: ROBOT ASSISTED MYOMECTOMY: SHX5142

## 2022-12-20 HISTORY — PX: CYSTOSCOPY: SHX5120

## 2022-12-20 HISTORY — PX: BOWEL RESECTION: SHX1257

## 2022-12-20 LAB — TYPE AND SCREEN
ABO/RH(D): A POS
Antibody Screen: NEGATIVE

## 2022-12-20 SURGERY — MYOMECTOMY, ROBOT-ASSISTED
Anesthesia: General | Site: Vagina

## 2022-12-20 MED ORDER — HEPARIN SODIUM (PORCINE) 5000 UNIT/ML IJ SOLN
5000.0000 [IU] | INTRAMUSCULAR | Status: AC
  Administered 2022-12-20: 5000 [IU] via SUBCUTANEOUS
  Filled 2022-12-20: qty 1

## 2022-12-20 MED ORDER — OXYCODONE HCL 5 MG PO TABS: 5.0000 mg | ORAL_TABLET | Freq: Once | ORAL | Status: DC | PRN

## 2022-12-20 MED ORDER — BUPIVACAINE LIPOSOME 1.3 % IJ SUSP
INTRAMUSCULAR | Status: DC | PRN
Start: 2022-12-20 — End: 2022-12-20
  Administered 2022-12-20: 20 mL

## 2022-12-20 MED ORDER — ONDANSETRON HCL 4 MG/2ML IJ SOLN
INTRAMUSCULAR | Status: DC | PRN
  Administered 2022-12-20: 4 mg via INTRAVENOUS

## 2022-12-20 MED ORDER — PROPOFOL 10 MG/ML IV BOLUS
INTRAVENOUS | Status: AC
  Filled 2022-12-20: qty 20

## 2022-12-20 MED ORDER — HYDROMORPHONE HCL 1 MG/ML IJ SOLN
0.5000 mg | INTRAMUSCULAR | Status: DC | PRN
  Administered 2022-12-20: 0.5 mg via INTRAVENOUS
  Filled 2022-12-20: qty 0.5

## 2022-12-20 MED ORDER — FENTANYL CITRATE (PF) 100 MCG/2ML IJ SOLN
INTRAMUSCULAR | Status: DC | PRN
  Administered 2022-12-20: 100 ug via INTRAVENOUS
  Administered 2022-12-20: 50 ug via INTRAVENOUS

## 2022-12-20 MED ORDER — HYDROMORPHONE HCL 1 MG/ML IJ SOLN
0.2500 mg | INTRAMUSCULAR | Status: DC | PRN
  Administered 2022-12-20: 0.25 mg via INTRAVENOUS

## 2022-12-20 MED ORDER — CHLORHEXIDINE GLUCONATE 0.12 % MT SOLN
15.0000 mL | Freq: Once | OROMUCOSAL | Status: AC
  Administered 2022-12-20: 15 mL via OROMUCOSAL

## 2022-12-20 MED ORDER — PROMETHAZINE HCL 25 MG/ML IJ SOLN
6.2500 mg | INTRAMUSCULAR | Status: DC | PRN
  Administered 2022-12-20: 6.25 mg via INTRAVENOUS

## 2022-12-20 MED ORDER — AMISULPRIDE (ANTIEMETIC) 5 MG/2ML IV SOLN: 10.0000 mg | Freq: Once | INTRAVENOUS | Status: DC | PRN

## 2022-12-20 MED ORDER — OXYCODONE HCL 5 MG PO TABS: 5.0000 mg | ORAL_TABLET | ORAL | Status: DC | PRN

## 2022-12-20 MED ORDER — KCL IN DEXTROSE-NACL 20-5-0.45 MEQ/L-%-% IV SOLN
INTRAVENOUS | Status: DC
  Filled 2022-12-20 (×2): qty 1000

## 2022-12-20 MED ORDER — DEXAMETHASONE SODIUM PHOSPHATE 4 MG/ML IJ SOLN
4.0000 mg | INTRAMUSCULAR | Status: AC
  Administered 2022-12-20: 5 mg via INTRAVENOUS

## 2022-12-20 MED ORDER — ENSURE PRE-SURGERY PO LIQD: 296.0000 mL | Freq: Once | ORAL | Status: DC

## 2022-12-20 MED ORDER — SODIUM CHLORIDE (PF) 0.9 % IJ SOLN
INTRAMUSCULAR | Status: DC | PRN
Start: 2022-12-20 — End: 2022-12-20
  Administered 2022-12-20: 20 mL

## 2022-12-20 MED ORDER — STERILE WATER FOR IRRIGATION IR SOLN
Status: DC | PRN
  Administered 2022-12-20: 1000 mL

## 2022-12-20 MED ORDER — POVIDONE-IODINE 10 % EX SWAB: 2.0000 | Freq: Once | CUTANEOUS | Status: DC

## 2022-12-20 MED ORDER — INDOCYANINE GREEN 25 MG IV SOLR
INTRAVENOUS | Status: DC | PRN
  Administered 2022-12-20: 2.5 mg via INTRAVENOUS

## 2022-12-20 MED ORDER — ORAL CARE MOUTH RINSE: 15.0000 mL | Freq: Once | OROMUCOSAL | Status: AC

## 2022-12-20 MED ORDER — TRAMADOL HCL 50 MG PO TABS
100.0000 mg | ORAL_TABLET | Freq: Two times a day (BID) | ORAL | Status: DC | PRN
  Administered 2022-12-21: 100 mg via ORAL
  Filled 2022-12-20: qty 2

## 2022-12-20 MED ORDER — ROCURONIUM BROMIDE 100 MG/10ML IV SOLN
INTRAVENOUS | Status: DC | PRN
  Administered 2022-12-20: 100 mg via INTRAVENOUS

## 2022-12-20 MED ORDER — OXYCODONE HCL 5 MG/5ML PO SOLN: 5.0000 mg | Freq: Once | ORAL | Status: DC | PRN

## 2022-12-20 MED ORDER — PHENYLEPHRINE HCL (PRESSORS) 10 MG/ML IV SOLN
INTRAVENOUS | Status: DC | PRN
  Administered 2022-12-20: 80 ug via INTRAVENOUS

## 2022-12-20 MED ORDER — ONDANSETRON HCL 4 MG PO TABS: 4.0000 mg | ORAL_TABLET | Freq: Four times a day (QID) | ORAL | Status: DC | PRN

## 2022-12-20 MED ORDER — METRONIDAZOLE 500 MG/100ML IV SOLN
INTRAVENOUS | Status: DC | PRN
  Administered 2022-12-20: 500 mg via INTRAVENOUS

## 2022-12-20 MED ORDER — SODIUM CHLORIDE (PF) 0.9 % IJ SOLN
INTRAMUSCULAR | Status: AC
  Filled 2022-12-20: qty 20

## 2022-12-20 MED ORDER — ALBUMIN HUMAN 5 % IV SOLN: INTRAVENOUS | Status: DC | PRN

## 2022-12-20 MED ORDER — ACETAMINOPHEN 500 MG PO TABS
1000.0000 mg | ORAL_TABLET | ORAL | Status: AC
  Administered 2022-12-20: 1000 mg via ORAL
  Filled 2022-12-20: qty 2

## 2022-12-20 MED ORDER — BUPIVACAINE HCL 0.25 % IJ SOLN
INTRAMUSCULAR | Status: AC
  Filled 2022-12-20: qty 1

## 2022-12-20 MED ORDER — IBUPROFEN 200 MG PO TABS
600.0000 mg | ORAL_TABLET | Freq: Four times a day (QID) | ORAL | Status: DC
  Administered 2022-12-21 – 2022-12-22 (×6): 600 mg via ORAL
  Filled 2022-12-20 (×6): qty 3

## 2022-12-20 MED ORDER — LACTATED RINGERS IV SOLN: INTRAVENOUS | Status: DC | PRN

## 2022-12-20 MED ORDER — PROMETHAZINE HCL 25 MG/ML IJ SOLN
INTRAMUSCULAR | Status: AC
  Filled 2022-12-20: qty 1

## 2022-12-20 MED ORDER — DEXMEDETOMIDINE HCL IN NACL 80 MCG/20ML IV SOLN
INTRAVENOUS | Status: AC
  Filled 2022-12-20: qty 20

## 2022-12-20 MED ORDER — MIDAZOLAM HCL 2 MG/2ML IJ SOLN
INTRAMUSCULAR | Status: AC
  Filled 2022-12-20: qty 2

## 2022-12-20 MED ORDER — BUPIVACAINE LIPOSOME 1.3 % IJ SUSP
INTRAMUSCULAR | Status: AC
  Filled 2022-12-20: qty 20

## 2022-12-20 MED ORDER — SENNOSIDES-DOCUSATE SODIUM 8.6-50 MG PO TABS
2.0000 | ORAL_TABLET | Freq: Every day | ORAL | Status: DC
  Administered 2022-12-21: 2 via ORAL
  Filled 2022-12-20: qty 2

## 2022-12-20 MED ORDER — 0.9 % SODIUM CHLORIDE (POUR BTL) OPTIME
TOPICAL | Status: DC | PRN
  Administered 2022-12-20: 2000 mL

## 2022-12-20 MED ORDER — FENTANYL CITRATE (PF) 250 MCG/5ML IJ SOLN
INTRAMUSCULAR | Status: AC
  Filled 2022-12-20: qty 5

## 2022-12-20 MED ORDER — DEXMEDETOMIDINE HCL IN NACL 80 MCG/20ML IV SOLN
INTRAVENOUS | Status: DC | PRN
Start: 2022-12-20 — End: 2022-12-20
  Administered 2022-12-20: 6 ug via INTRAVENOUS

## 2022-12-20 MED ORDER — PHENYLEPHRINE HCL-NACL 20-0.9 MG/250ML-% IV SOLN
INTRAVENOUS | Status: AC
  Filled 2022-12-20: qty 250

## 2022-12-20 MED ORDER — ENSURE PRE-SURGERY PO LIQD: 592.0000 mL | Freq: Once | ORAL | Status: DC

## 2022-12-20 MED ORDER — HYDROMORPHONE HCL 1 MG/ML IJ SOLN
INTRAMUSCULAR | Status: AC
  Administered 2022-12-20: 0.25 mg via INTRAVENOUS
  Filled 2022-12-20: qty 1

## 2022-12-20 MED ORDER — PHENYLEPHRINE HCL-NACL 20-0.9 MG/250ML-% IV SOLN
INTRAVENOUS | Status: DC | PRN
Start: 2022-12-20 — End: 2022-12-20
  Administered 2022-12-20: 15 ug/min via INTRAVENOUS

## 2022-12-20 MED ORDER — METRONIDAZOLE 500 MG/100ML IV SOLN
500.0000 mg | INTRAVENOUS | Status: DC
  Filled 2022-12-20: qty 100

## 2022-12-20 MED ORDER — PROPOFOL 10 MG/ML IV BOLUS
INTRAVENOUS | Status: DC | PRN
  Administered 2022-12-20: 130 mg via INTRAVENOUS

## 2022-12-20 MED ORDER — ENOXAPARIN SODIUM 40 MG/0.4ML IJ SOSY
40.0000 mg | PREFILLED_SYRINGE | INTRAMUSCULAR | Status: DC
  Administered 2022-12-21 – 2022-12-22 (×2): 40 mg via SUBCUTANEOUS
  Filled 2022-12-20 (×2): qty 0.4

## 2022-12-20 MED ORDER — LACTATED RINGERS IR SOLN
Status: DC | PRN
  Administered 2022-12-20: 1000 mL

## 2022-12-20 MED ORDER — MIDAZOLAM HCL 5 MG/5ML IJ SOLN
INTRAMUSCULAR | Status: DC | PRN
  Administered 2022-12-20: 1.5 mg via INTRAVENOUS
  Administered 2022-12-20: .5 mg via INTRAVENOUS

## 2022-12-20 MED ORDER — SODIUM CHLORIDE 0.9 % IV SOLN
2.0000 g | INTRAVENOUS | Status: AC
  Administered 2022-12-20: 2 g via INTRAVENOUS
  Filled 2022-12-20: qty 2

## 2022-12-20 MED ORDER — LIDOCAINE HCL (CARDIAC) PF 100 MG/5ML IV SOSY
PREFILLED_SYRINGE | INTRAVENOUS | Status: DC | PRN
  Administered 2022-12-20: 80 mg via INTRAVENOUS

## 2022-12-20 MED ORDER — ACETAMINOPHEN 500 MG PO TABS
1000.0000 mg | ORAL_TABLET | Freq: Two times a day (BID) | ORAL | Status: DC
  Administered 2022-12-21 – 2022-12-22 (×3): 1000 mg via ORAL
  Filled 2022-12-20 (×4): qty 2

## 2022-12-20 MED ORDER — ONDANSETRON HCL 4 MG/2ML IJ SOLN
4.0000 mg | Freq: Four times a day (QID) | INTRAMUSCULAR | Status: DC | PRN
  Administered 2022-12-20: 4 mg via INTRAVENOUS
  Filled 2022-12-20: qty 2

## 2022-12-20 MED ORDER — LACTATED RINGERS IV SOLN: INTRAVENOUS | Status: DC

## 2022-12-20 SURGICAL SUPPLY — 94 items
ADH SKN CLS APL DERMABOND .7 (GAUZE/BANDAGES/DRESSINGS) ×2
AGENT HMST KT MTR STRL THRMB (HEMOSTASIS)
APL ESCP 34 STRL LF DISP (HEMOSTASIS)
APPLICATOR SURGIFLO ENDO (HEMOSTASIS) IMPLANT
BAG COUNTER SPONGE SURGICOUNT (BAG) IMPLANT
BAG LAPAROSCOPIC 12 15 PORT 16 (BASKET) IMPLANT
BAG RETRIEVAL 12/15 (BASKET)
BAG SPNG CNTER NS LX DISP (BAG)
BLADE SURG SZ10 CARB STEEL (BLADE) IMPLANT
COVER BACK TABLE 60X90IN (DRAPES) ×3 IMPLANT
COVER MAYO STAND STRL (DRAPES) IMPLANT
COVER SURGICAL LIGHT HANDLE (MISCELLANEOUS) IMPLANT
COVER TIP SHEARS 8 DVNC (MISCELLANEOUS) ×3 IMPLANT
DERMABOND ADVANCED .7 DNX12 (GAUZE/BANDAGES/DRESSINGS) ×3 IMPLANT
DRAPE ARM DVNC X/XI (DISPOSABLE) ×12 IMPLANT
DRAPE COLUMN DVNC XI (DISPOSABLE) ×3 IMPLANT
DRAPE SHEET LG 3/4 BI-LAMINATE (DRAPES) ×3 IMPLANT
DRAPE SURG IRRIG POUCH 19X23 (DRAPES) ×3 IMPLANT
DRAPE WARM FLUID 44X44 (DRAPES) IMPLANT
DRIVER NDL MEGA SUTCUT DVNCXI (INSTRUMENTS) ×3 IMPLANT
DRIVER NDLE MEGA SUTCUT DVNCXI (INSTRUMENTS) ×2
DRSG OPSITE POSTOP 4X6 (GAUZE/BANDAGES/DRESSINGS) IMPLANT
DRSG OPSITE POSTOP 4X8 (GAUZE/BANDAGES/DRESSINGS) IMPLANT
ELECT PENCIL ROCKER SW 15FT (MISCELLANEOUS) IMPLANT
ELECT REM PT RETURN 15FT ADLT (MISCELLANEOUS) ×3 IMPLANT
FORCEPS BPLR FENES DVNC XI (FORCEP) ×3 IMPLANT
FORCEPS PROGRASP DVNC XI (FORCEP) ×3 IMPLANT
GAUZE 4X4 16PLY ~~LOC~~+RFID DBL (SPONGE) ×6 IMPLANT
GLOVE BIO SURGEON STRL SZ 6 (GLOVE) ×12 IMPLANT
GLOVE BIO SURGEON STRL SZ 6.5 (GLOVE) ×3 IMPLANT
GOWN STRL REUS W/ TWL LRG LVL3 (GOWN DISPOSABLE) ×12 IMPLANT
GOWN STRL REUS W/TWL LRG LVL3 (GOWN DISPOSABLE) ×8
GRASPER SUT TROCAR 14GX15 (MISCELLANEOUS) IMPLANT
HOLDER FOLEY CATH W/STRAP (MISCELLANEOUS) IMPLANT
IRRIG SUCT STRYKERFLOW 2 WTIP (MISCELLANEOUS) ×2
IRRIGATION SUCT STRKRFLW 2 WTP (MISCELLANEOUS) ×3 IMPLANT
KIT PROCEDURE DVNC SI (MISCELLANEOUS) IMPLANT
KIT TURNOVER KIT A (KITS) IMPLANT
LIGASURE IMPACT 36 18CM CVD LR (INSTRUMENTS) IMPLANT
MANIPULATOR ADVINCU DEL 3.0 PL (MISCELLANEOUS) IMPLANT
MANIPULATOR ADVINCU DEL 3.5 PL (MISCELLANEOUS) IMPLANT
MANIPULATOR UTERINE 4.5 ZUMI (MISCELLANEOUS) IMPLANT
NDL HYPO 21X1.5 SAFETY (NEEDLE) ×3 IMPLANT
NDL SPNL 18GX3.5 QUINCKE PK (NEEDLE) IMPLANT
NEEDLE HYPO 21X1.5 SAFETY (NEEDLE) ×2
NEEDLE SPNL 18GX3.5 QUINCKE PK (NEEDLE)
OBTURATOR OPTICAL STND 8 DVNC (TROCAR) ×2
OBTURATOR OPTICALSTD 8 DVNC (TROCAR) ×3 IMPLANT
PACK ROBOT GYN CUSTOM WL (TRAY / TRAY PROCEDURE) ×3 IMPLANT
PAD POSITIONING PINK XL (MISCELLANEOUS) ×3 IMPLANT
PORT ACCESS TROCAR AIRSEAL 12 (TROCAR) IMPLANT
RELOAD STAPLE 60 4.3 GRN DVNC (STAPLE) IMPLANT
SCISSORS MNPLR CVD DVNC XI (INSTRUMENTS) ×3 IMPLANT
SCRUB CHG 4% DYNA-HEX 4OZ (MISCELLANEOUS) IMPLANT
SEAL UNIV 5-12 XI (MISCELLANEOUS) ×12 IMPLANT
SEALER VESSEL EXT DVNC XI (MISCELLANEOUS) IMPLANT
SET TRI-LUMEN FLTR TB AIRSEAL (TUBING) ×3 IMPLANT
SPIKE FLUID TRANSFER (MISCELLANEOUS) ×3 IMPLANT
SPONGE T-LAP 18X18 ~~LOC~~+RFID (SPONGE) IMPLANT
STAPLER 60 SUREFORM DVNC (STAPLE) IMPLANT
STAPLER ECHELON POWER CIR 29 (STAPLE) IMPLANT
STAPLER RELOAD 4.3X60 GRN DVNC (STAPLE) ×2
SURGIFLO W/THROMBIN 8M KIT (HEMOSTASIS) IMPLANT
SUT MNCRL AB 4-0 PS2 18 (SUTURE) IMPLANT
SUT PDS AB 1 CT1 27 (SUTURE) IMPLANT
SUT PDS AB 1 TP1 96 (SUTURE) IMPLANT
SUT PROLENE 2 0 KS (SUTURE) IMPLANT
SUT SILK 2 0 (SUTURE) ×2
SUT SILK 2 0 SH CR/8 (SUTURE) IMPLANT
SUT SILK 2-0 18XBRD TIE 12 (SUTURE) IMPLANT
SUT SILK 3 0 SH CR/8 (SUTURE) IMPLANT
SUT V-LOC 180 0-0 GS22 (SUTURE) IMPLANT
SUT VIC AB 0 CT1 27 (SUTURE)
SUT VIC AB 0 CT1 27XBRD ANTBC (SUTURE) IMPLANT
SUT VIC AB 2-0 CT1 27 (SUTURE)
SUT VIC AB 2-0 CT1 TAPERPNT 27 (SUTURE) IMPLANT
SUT VIC AB 4-0 PS2 18 (SUTURE) ×6 IMPLANT
SUT VICRYL 0 27 CT2 27 ABS (SUTURE) ×3 IMPLANT
SUT VICRYL 0 UR6 27IN ABS (SUTURE) IMPLANT
SUT VLOC 180 0 9IN GS21 (SUTURE) IMPLANT
SYR 10ML LL (SYRINGE) IMPLANT
SYS BAG RETRIEVAL 10MM (BASKET)
SYS RETRIEVAL 5MM INZII UNIV (BASKET) ×2
SYS WOUND ALEXIS 18CM MED (MISCELLANEOUS)
SYSTEM BAG RETRIEVAL 10MM (BASKET) IMPLANT
SYSTEM RETRIEVL 5MM INZII UNIV (BASKET) IMPLANT
SYSTEM WOUND ALEXIS 18CM MED (MISCELLANEOUS) IMPLANT
TOWEL OR NON WOVEN STRL DISP B (DISPOSABLE) IMPLANT
TRAP SPECIMEN MUCUS 40CC (MISCELLANEOUS) IMPLANT
TRAY FOLEY MTR SLVR 16FR STAT (SET/KITS/TRAYS/PACK) ×3 IMPLANT
TROCAR PORT AIRSEAL 5X120 (TROCAR) IMPLANT
UNDERPAD 30X36 HEAVY ABSORB (UNDERPADS AND DIAPERS) ×6 IMPLANT
WATER STERILE IRR 1000ML POUR (IV SOLUTION) ×3 IMPLANT
YANKAUER SUCT BULB TIP 10FT TU (MISCELLANEOUS) IMPLANT

## 2022-12-20 NOTE — Transfer of Care (Signed)
Immediate Anesthesia Transfer of Care Note  Patient: Robin Sellers  Procedure(s) Performed: DIAGNOSTIC LAPAROSCOPY, XI ROBOTIC ASSISTED PELVIC MASS EXCISION (Abdomen) RECTOSIGMOID RESECTION AND REANASTAMOSIS, MINI LAPAROTOMY (Abdomen) CYSTOSCOPY (Vagina )  Patient Location: PACU  Anesthesia Type:GA combined with regional for post-op pain  Level of Consciousness: sedated  Airway & Oxygen Therapy: Patient Spontanous Breathing and Patient connected to face mask oxygen  Post-op Assessment: Report given to RN and Post -op Vital signs reviewed and stable  Post vital signs: Reviewed and stable  Last Vitals:  Vitals Value Taken Time  BP    Temp    Pulse    Resp    SpO2      Last Pain:  Vitals:   12/20/22 1052  TempSrc:   PainSc: 0-No pain         Complications: No notable events documented.

## 2022-12-20 NOTE — Op Note (Signed)
PATIENT: Robin Sellers  72 y.o. 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 Glinda Natzke, MD  ASSISTANT: Eugene Garnet, MD  ANESTHESIA: General endotracheal  EBL: 50 mL for my portion of the procedure Total I/O In: 1950 [I.V.:1600; IV Piggyback:350] Out: 425 [Urine:325; Blood:100]  DRAINS: none  SPECIMEN:  Rectosigmoid colon, open and proximal Distal anastomotic donut, final distal margin Left lower quadrant peritoneal nodule  COUNTS: Sponge, needle and instrument counts were reported correct x2  FINDINGS: Was called intraoperatively when a second mass was identified intimately associated with the proximal rectum.  This was being done for ovarian cancer.  The mass occupies approximately 40 to 50% of the circumference and is at least 3 to 4 cm in size.  It was not felt that any sort of disc excision would resulted in an adequate remaining defect for appropriate primary repair/closure.  Therefore, resection was planned.  She had undergone a bowel prep preoperatively.  I discussed her case with Dr. [name] and had noted that she is a quite healthy 72 year old female.  Her preoperative albumin is 4.5 and no nutritional concerns have been reported.  A tension-free, hemostatic, well-perfused 29 mm colorectal anastomosis was fashioned 11 cm from the anal verge by flexible sigmoidoscopy.    NARRATIVE: I was called intraoperatively.  Dr. [name] had found a second mass involving her proximal rectum.  Please refer to her note for details as well.  She was in the lithotomy position having already received preoperative antibiotics and had already  undergone the salpingo-oophorectomy portion of her procedure for excision of this mass.  The mass is identified on the proximal rectum just below where the tinea splayed.  It is about 3 to 4 cm in size and occupies at least 40 to 50% of the wall of the bowel.  It did not appear this to be amenable to any sort of local resection and primary closure that would result in any sort of satisfactory result.  He was clear based on this that a low anterior sexually necessary.  She had received a preoperative bowel prep with satisfactory result by report.  Using the already in place trocars and instruments, we began by approaching the low anterior resection portion of the procedure.   The sigmoid colon was readily identified.  It is relatively mobile and redundant.  A few sigmoid attachments are released from the intersigmoid fossa.  The left ureter is able to be readily identified from this lateral to medial type approach.  It is normal in appearance.  Were also able to visualize her right ureter in its typical trajectory through her pelvis which also appears normal.  The rectosigmoid colon is elevated anteriorly.  Erythema overlying the sacral promontory is incised.  The presacral plane is red identified.  The hypogastric nerves identified and swept "down."  These were protected free of injury throughout their course.  The TME plane is continued working between the fascia propria the rectum and the presacral fascia into the mid pelvis.  After developing a posterior plane, the lateral planes were developed terminating in a level approximately 1 to 2 cm below this mass.  The mesorectum at this location is circumferentially cleared.  We were able to  preserve portions of the IMA and doing a segmental type resection at this level.  The planned proximal point of transection is identified on the mid sigmoid colon.  The left ureter was reidentified and confirmed to be down and well away from the sigmoid mesentery.  The  mesentery out to this level is then cleared using the vessel sealer.  The sigmoid colon had been fully mobilized and the planned proximal point of transection easily reaches into the deep pelvis without any difficulty.  The distal point of transection on the proximal rectum was identified.   A 60 mm green load robotic stapler was then placed through a pre-existing 12 mm port and introduced into the peritoneal cavity.  The rectum was divided with a single firing of the stapler.  The stump was intact and healthy in appearance.   Attention was turned to performing a perfusion test. ICG was administered by anesthesia and at the level of the cleared mesentery proximally, there was excellent uptake of the tracer.  The rectum is also well perfused in appearance.  This colon is also supple and healthy in appearance without any thickening.  This reached into the pelvis without any difficulty and remained in that location without any tension. A locking grasper was then placed on the sigmoid staple line.   Attention was turned to the extracorporeal portion of the procedure.  The robot was undocked.  I scrubbed in.  A Pfannenstiel incision was created using her pre-existing scar. The rectus fascia was incised and then elevated.  The rectus muscle was mobilized free of the overlying fascia.  The peritoneum was incised in the midline well above the location of the bladder.  An Alexis wound protector was placed.  Towels were placed around the field.  The divided colon was passed through the wound protector.  The point of proximal division was identified and was again on a healthy segment of supple colon with a palpable pulse in the mesentery. This was pink in color.  A pursestring device was applied.  A 2-0 Prolene on a Keith needle was passed.  The colon was divided and passed off with the open end being proximal.  EEA sizers were then introduced and a 29 mm EEA selected.  "Belt loops" consisting of 3-0 silk were placed  around the pursestring suture line.  The anvil was placed and the pursestring tied.  A small amount of fat was cleared from the planned anastomosis and no diverticula were apparent within this.  This was placed back into the abdomen and a cap placed over the wound protector port site.  Pneumoperitoneum was reestablished.  Dr. [name] went below to pass the stapler.  I remained above.  EEA sizers were cautiously introduced via the anus and advanced under direct visualization.  The stapler was passed and the spike deployed just anterior to the staple line.  The components were then mated.  Orientation was confirmed such that there is no twisting of the colon nor small bowel underneath the mesenteric defect. Care was taken to ensure no other structures were incorporated within this either.  The stapler was then closed, held, and fired. This was then removed. The donuts were inspected and noted to be complete.  The colon proximal to the anastomosis was then gently occluded. The pelvis was filled with sterile irrigation. Under direct visualization, I passed a flexible sigmoidoscope.  The anastomosis was under water.  With good distention of the anastomosis there was no air leak. The anastomosis pink in  appearance.  This is located at 11 cm from the anal verge by flexible sigmoidoscopy.  It is hemostatic.  Additionally, looking from above, there is no tension on the colon or mesentery.  Sigmoidoscope was withdrawn.  Irrigation was evacuated from the pelvis.  The abdomen and pelvis are surveyed and noted to be completely hemostatic without any apparent injury.  Under direct visualization, all trochars are removed.  The Alexis wound protector was removed.  Gowns/gloves are changed and a fresh set of clean instruments utilized. Additional sterile drapes were placed around the field.   The Pfannenstiel peritoneum was closed with a running 2-0 Vicryl suture.  The rectus fascia was then closed using 2 running #1 PDS sutures.   The fascia was then palpated and noted to be completely closed.  Sponge, needle, and instrument counts were reported correct x2.  Case was turned back over to Dr. [name] and her team.  4-0 Monocryl subcuticular suture was used to close the skin of all incision sites.

## 2022-12-20 NOTE — Anesthesia Preprocedure Evaluation (Signed)
Anesthesia Evaluation  Patient identified by MRN, date of birth, ID band Patient awake    Reviewed: Allergy & Precautions, NPO status , Patient's Chart, lab work & pertinent test results  Airway Mallampati: I  TM Distance: >3 FB Neck ROM: Full    Dental  (+) Teeth Intact, Dental Advisory Given   Pulmonary neg pulmonary ROS   breath sounds clear to auscultation       Cardiovascular negative cardio ROS  Rhythm:Regular Rate:Normal     Neuro/Psych negative neurological ROS  negative psych ROS   GI/Hepatic negative GI ROS, Neg liver ROS,,,  Endo/Other  negative endocrine ROS    Renal/GU negative Renal ROS     Musculoskeletal negative musculoskeletal ROS (+)    Abdominal Normal abdominal exam  (+)   Peds  Hematology negative hematology ROS (+)   Anesthesia Other Findings Pelvic Mass  Reproductive/Obstetrics                             Anesthesia Physical Anesthesia Plan  ASA: 3  Anesthesia Plan: General   Post-op Pain Management: Tylenol PO (pre-op)*   Induction: Intravenous  PONV Risk Score and Plan: 3 and Ondansetron, Dexamethasone, Midazolam and Treatment may vary due to age or medical condition  Airway Management Planned: Oral ETT  Additional Equipment: None  Intra-op Plan:   Post-operative Plan: Extubation in OR  Informed Consent: I have reviewed the patients History and Physical, chart, labs and discussed the procedure including the risks, benefits and alternatives for the proposed anesthesia with the patient or authorized representative who has indicated his/her understanding and acceptance.     Dental advisory given  Plan Discussed with: CRNA  Anesthesia Plan Comments:         Anesthesia Quick Evaluation

## 2022-12-20 NOTE — Interval H&P Note (Signed)
History and Physical Interval Note:  12/20/2022 10:23 AM  Robin Sellers  has presented today for surgery, with the diagnosis of High grade serous ovarian cancer.  The various methods of treatment have been discussed with the patient and family. After consideration of risks, benefits and other options for treatment, the patient has consented to  Procedure(s): LAPAROSCOPIC DIAGNOSTIC/ POSSIBLE XI ROBOTIC ASSISTED VS. OPEN PELVIC MASS EXCISION (N/A) POSSIBLE BOWEL RESECTION (N/A) as a surgical intervention.  The patient's history has been reviewed, patient examined, no change in status, stable for surgery.  I have reviewed the patient's chart and labs.  Questions were answered to the patient's satisfaction.     [name]

## 2022-12-20 NOTE — Brief Op Note (Signed)
12/20/2022  4:30 PM  PATIENT:  Robin Sellers  72 y.o. female  PRE-OPERATIVE DIAGNOSIS:  High grade serous ovarian cancer  POST-OPERATIVE DIAGNOSIS:  High grade serous ovarian cancer  PROCEDURE:  Procedure(s): DIAGNOSTIC LAPAROSCOPY, XI ROBOTIC ASSISTED PELVIC MASS EXCISION (N/A) BOWEL RESECTION (N/A) CYSTOSCOPY  SURGEON:  Surgeons and Role:    [name], MD - Primary    * [name], Stephanie Coup, MD - Assisting  ASSISTANTS: Antionette Char, MD   ANESTHESIA:   general  EBL:  100 mL   BLOOD ADMINISTERED:none  DRAINS: none   LOCAL MEDICATIONS USED:  MARCAINE, EXPAREL  SPECIMEN:  para-vaginal mass, rectosigmoid colon, left para-colic gutter mass  DISPOSITION OF SPECIMEN:  PATHOLOGY  COUNTS:  YES  TOURNIQUET:  * No tourniquets in log *  DICTATION: .Note written in EPIC  PLAN OF CARE: Admit to inpatient   PATIENT DISPOSITION:  PACU - hemodynamically stable.   Delay start of Pharmacological VTE agent (>24hrs) due to surgical blood loss or risk of bleeding: no

## 2022-12-20 NOTE — Discharge Instructions (Addendum)
AFTER SURGERY INSTRUCTIONS   Return to work: 4-6 weeks if applicable  PLAN ON STARTING ELIQUIS (BLOOD THINNER) tomorrow, December 23, 2022, since you received the blood thinner injection lovenox before leaving the hospital. Plan on taking this at 8 am and 8 pm daily.  YOU WILL TAKE ELIQUIS TWICE DAILY FOR TWO WEEKS. THIS IS A BLOOD THINNER TO PREVENT BLOOD CLOTS SO YOU WILL NEED TO MONITOR FOR BLEEDING AND CALL THE OFFICE.   AVOID USE OF NSAIDS (IBUPROFEN, NAPROXEN) WHILE TAKING THE BLOOD THINNER.    You may have a white honeycomb dressing over your larger incision. This dressing can be removed 5 days after surgery and you do not need to reapply a new dressing. Once you remove the dressing, you will notice that you have the surgical glue (dermabond) on the incision and this will peel off on its own. You can get this dressing wet in the shower the days after surgery prior to removal on the 5th day.    Activity: 1. Be up and out of the bed during the day.  Take a nap if needed.  You may walk up steps but be careful and use the hand rail.  Stair climbing will tire you more than you think, you may need to stop part way and rest.    2. No lifting or straining for 6 weeks over 10 pounds. No pushing, pulling, straining for 6 weeks.   3. No driving for around 2-44 days(s).  Do not drive if you are taking narcotic pain medicine and make sure that your reaction time has returned.    4. You can shower as soon as the next day after surgery. Shower daily.  Use your regular soap and water (not directly on the incision) and pat your incision(s) dry afterwards; don't rub.  No tub baths or submerging your body in water until cleared by your surgeon. If you have the soap that was given to you by pre-surgical testing that was used before surgery, you do not need to use it afterwards because this can irritate your incisions.    5. No sexual activity and nothing in the vagina for 4-6 weeks.   6. You may experience  a small amount of clear drainage from your incisions, which is normal.  If the drainage persists, increases, or changes color please call the office.   7. Do not use creams, lotions, or ointments such as neosporin on your incisions after surgery until advised by your surgeon because they can cause removal of the dermabond glue on your incisions.     8. Take Tylenol first for pain if you are able to take these medication and only use narcotic pain medication for severe pain not relieved by the Tylenol.  Monitor your Tylenol intake to a max of 4,000 mg in a 24 hour period.    Diet: 1. Low sodium Heart Healthy Diet is recommended but you are cleared to resume your normal (before surgery) diet after your procedure.   2. Monitor your bowel movements. If you feel you are becoming constipated, you can take a stool softener but avoid stimulants like sennakot.    Wound Care: 1. Keep clean and dry.  Shower daily.   Reasons to call the Doctor: Fever - Oral temperature greater than 100.4 degrees Fahrenheit Foul-smelling vaginal discharge Difficulty urinating Nausea and vomiting Increased pain at the site of the incision that is unrelieved with pain medicine. Difficulty breathing with or without chest pain New calf pain especially  if only on one side Sudden, continuing increased vaginal bleeding with or without clots.   Contacts: For questions or concerns you should contact:   Dr. Eugene Garnet at [phone]   Warner Mccreedy, NP at [phone]   After Hours: call [phone] and have the GYN Oncologist paged/contacted (after 5 pm or on the weekends). You will speak with an after hours RN and let he or she know you have had surgery.   Messages sent via mychart are for non-urgent matters and are not responded to after hours so for urgent needs, please call the after hours number.

## 2022-12-20 NOTE — Op Note (Signed)
OPERATIVE NOTE  Pre-operative Diagnosis: Recurrent HGS ovarian cancer  Post-operative Diagnosis: same  Operation: Robotic-assisted excision of pelvic mass requiring colpotomy with vaginal cuff closure [name]), excision of left para-colic gutter implant ([name]), low anterior resection with reanastomosis [name]), cystoscopy [name])  Surgeon: Eugene Garnet MD  Assistant Surgeon: Antionette Char MD (an MD assistant was necessary for tissue manipulation, management of robotic instrumentation, retraction and positioning due to the complexity of the case and hospital policies).   Anesthesia: GET  Urine Output: 500 cc  Operative Findings: On EUA, 2-3 cm mass is palpable in the vaginal apex, and close proximity to the rectum but without direct involvement.  Intra-abdominal entry, normal-appearing diaphragm, liver edge, and stomach.  No peritoneal disease or implants noted.  Normal-appearing small bowel.  Surgically absent uterus, cervix, fallopian tubes and ovaries.  Small, 5 mm cystic nodule noted along the left paracolic gutter lateral to the sigmoid colon (excised).  Within the pelvis, there was a 3 cm cystic mass, apparently bilobed, adherent to the posterior aspect of the vaginal cuff in the midline into the right.  This was close to but not directly involving the rectum nor much of the rectal mesentery.  There was minimal spillage from the cystic lesion during manipulation to excise the specimen.  Upon resection of this area, the more superior aspect of the rectum was identified as having a similar appearing 1-1.5 cm cystic mass involving the rectum and adjacent mesentery.  The entire lesion was removed within the segment of rectosigmoid colon.  No additional implants or findings concerning for metastatic disease were noted in the abdomen or pelvis. R0 resection. On cystoscopy, bladder dome intact. Good efflux noted from bilateral ureteral orifices.  Estimated Blood Loss:  100 cc       Total IV Fluids: see I&O flowsheet         Specimens: paravaginal mass, left para-colic gutter implant, rectosigmoid colon with distal anastomotic donut         Complications:  None apparent; patient tolerated the procedure well.         Disposition: PACU - hemodynamically stable.  Procedure Details  The patient was seen in the Holding Room. The risks, benefits, complications, treatment options, and expected outcomes were discussed with the patient.  The patient concurred with the proposed plan, giving informed consent.  The site of surgery properly noted/marked. The patient was identified as Robin Sellers and the procedure verified as a Robotic-assisted versus open secondary debulking surgery.   After induction of anesthesia, the patient was draped and prepped in the usual sterile manner. Patient was placed in supine position after anesthesia and draped and prepped in the usual sterile manner as follows: Her arms were tucked to her side with all appropriate precautions.  The patient was secured to the bed using padding and tape across her chest.  The patient was placed in the semi-lithotomy position in [place] stirrups.  The perineum and vagina were prepped with CHG. The patient's abdomen was prepped with ChloraPrep and then she was draped after the prep had been allowed to dry for 3 minutes.  A Time Out was held and the above information confirmed.  The urethra was prepped with Betadine. Foley catheter was placed.  Sponge stick was placed in the vagina.  OG tube placement was confirmed and to suction.   Next, a 5 mm skin incision was made 1 cm below the subcostal margin in the midclavicular line.  The 5 mm Optiview port and scope was used  for direct entry.  Opening pressure was under 10 mm CO2.  The abdomen was insufflated and the findings were noted as above.   At this point and all points during the procedure, the patient's intra-abdominal pressure did not exceed 15 mmHg. Next, an 8 mm skin  incision was made superior to the umbilicus and a right and left port were placed about 8 cm lateral to the robot port on the right and left side.  A fourth arm was placed on the right.  The 5 mm assist trocar was exchanged for a 5 mm airseal port the supraumbilical robotic trocar was exchanged for a 12 mm robotic trocar. All ports were placed under direct visualization.  The patient was placed in steep Trendelenburg.  Bowel was folded away into the upper abdomen.  The robot was docked in the normal manner.  The pelvis was initially examined in the paravaginal mass identified.  An EEA sizer was placed within the distal rectum and a sponge stick in the vagina to help with mobilization of both structures.  Dr. [name] was present at this time and we both agreed that this paravaginal appeared to be free of the rectum itself.  Blood washings were collected.  Attention was then turned to the right.  The right ureter was identified.  The right peritoneum was opened lateral to the ureter and the peritoneal incision created towards the paravaginal mass, taking care to mobilize the ureter medially.  The lateral aspect of the mass was defined and the old uterine pedicle was cauterized and reincised.  With upward traction on the vagina, peritoneal incision was carried inferior to the mass using accommodation of sharp dissection and short bursts of monopolar electrocautery.  The uterosacral ligament was identified, cauterized, and transected.  Along the medial aspect of the mass, the rectum was identified and combination of sharp dissection and short burst of electrocautery as well as blunt dissection were used to mobilize the peritoneum and pararectal mesentery from the mass itself achieving a visually negative margin.  Once mobilized along the medial aspect, the superior aspect, which appeared to be at the level of the vaginal cuff was defined.  During this mobilization, there was a small amount of fluid noted coming from  one of the cystic components.  The ProGrasp was used to prevent further spillage and to manipulate the mass.  Ultimately, the vagina required incision to completely excise the mass.  Once the mass was completely freed, it was placed in an Endo Catch bag inserted through the assistant trocar.  The bag was then taken out through the vagina.  The pelvis was irrigated.  Good hemostasis was noted.  At this point, upon further examination of the rectum, a second smaller lesion was noted involving the rectum itself and mesentery.  I asked Dr. [name] to return.  His recommendation was to proceed with rectosigmoid resection rather than attempt at disc excision.  Please see his operative note for details regarding proctosigmoid resection with 3 anastomosis, left paracolic gutter nodule excision.  After the distal colon had been stapled and transected and we were ready for reanastomosis, I closed the partial colpotomy with 0 V-Loc in running fashion.  2 small hematomas were noted, both less than 1 cm, 1 within the lateral bed of the paravaginal mass excision and 1 within the lateral mesentery at the rectal stump.  Both of these were monitored and noted not to be expanding.  Please see his operative note for details on the small Pfannenstiel  incision, reanastomosis, and flexible sigmoidoscopy.   I performed cystoscopy after the bladder had been backfilled with 200 cc of sterile water.  Findings are as noted above.  Foley catheter was then replaced.  At this point in the procedure was completed.  The peritoneum and fascia of the mini lap incision were closed by Dr. [name].  The fascia at the 12 mm port was closed with 0 Vicryl on a UR-5 needle.  The subcuticular tissue all incisions was closed with 4-0 Vicryl and the skin was closed with 4-0 Monocryl in a subcuticular manner.  Dermabond was applied.  Honeycomb dressing was applied to the mini lap incision.  All sponge, lap and needle counts were correct x  3.   The  patient was transferred to the recovery room in stable condition.  Eugene Garnet, MD

## 2022-12-20 NOTE — Anesthesia Procedure Notes (Signed)
Procedure Name: Intubation Date/Time: 12/20/2022 1:20 PM  Performed by: Deri Fuelling, CRNAPre-anesthesia Checklist: Patient identified, Emergency Drugs available, Suction available and Patient being monitored Patient Re-evaluated:Patient Re-evaluated prior to induction Oxygen Delivery Method: Circle system utilized Preoxygenation: Pre-oxygenation with 100% oxygen Induction Type: IV induction Ventilation: Mask ventilation without difficulty Grade View: Grade I Tube type: Oral Tube size: 7.0 mm Number of attempts: 1 Airway Equipment and Method: Stylet and Oral airway Placement Confirmation: ETT inserted through vocal cords under direct vision, positive ETCO2 and breath sounds checked- equal and bilateral Secured at: 21 cm Tube secured with: Tape Dental Injury: Teeth and Oropharynx as per pre-operative assessment

## 2022-12-21 ENCOUNTER — Encounter (HOSPITAL_COMMUNITY): Payer: Self-pay | Admitting: Gynecologic Oncology

## 2022-12-21 LAB — MAGNESIUM: Magnesium: 1.8 mg/dL (ref 1.7–2.4)

## 2022-12-21 LAB — BASIC METABOLIC PANEL
Anion gap: 7 (ref 5–15)
BUN: 9 mg/dL (ref 8–23)
CO2: 24 mmol/L (ref 22–32)
Calcium: 7.9 mg/dL — ABNORMAL LOW (ref 8.9–10.3)
Chloride: 100 mmol/L (ref 98–111)
Creatinine, Ser: 0.77 mg/dL (ref 0.44–1.00)
GFR, Estimated: >60 mL/min (ref >60)
Glucose, Bld: 154 mg/dL — ABNORMAL HIGH (ref 70–99)
Potassium: 4.1 mmol/L (ref 3.5–5.1)
Sodium: 131 mmol/L — ABNORMAL LOW (ref 135–145)

## 2022-12-21 LAB — CBC
HCT: 35.5 % — ABNORMAL LOW (ref 36.0–46.0)
Hemoglobin: 12 g/dL (ref 12.0–15.0)
MCH: 30.7 pg (ref 26.0–34.0)
MCHC: 33.8 g/dL (ref 30.0–36.0)
MCV: 90.8 fL (ref 80.0–100.0)
Platelets: 160 10*3/uL (ref 150–400)
RBC: 3.91 MIL/uL (ref 3.87–5.11)
RDW: 12.3 % (ref 11.5–15.5)
WBC: 12.4 10*3/uL — ABNORMAL HIGH (ref 4.0–10.5)
nRBC: 0 % (ref 0.0–0.2)

## 2022-12-21 LAB — PHOSPHORUS: Phosphorus: 3.4 mg/dL (ref 2.5–4.6)

## 2022-12-21 NOTE — Progress Notes (Signed)
GYN Oncology Progress Note  Patient is alert, oriented, in no acute distress, resting in bed with two visitors present at bedside. Reports doing well. No pain reported. Tolerating diet with no nausea or emesis. Ambulating and sat in the chair. Passing flatus. Emptying bladder without difficulty. No needs voiced. Will plan on saline locking IV. Plan for discharge once return of bowel function in the form of a BM. Continue plan of care.

## 2022-12-21 NOTE — Progress Notes (Signed)
  Subjective No acute events. Feeling quite well. No complaints reported. Ambulating; feels belly rumbling; no flatus/bm yet. No n/v.  Objective: Vital signs in last 24 hours: Temp:  [97.3 F (36.3 C)-98.7 F (37.1 C)] 98.6 F (37 C) (08/29 0449) Pulse Rate:  [61-81] 75 (08/29 0449) Resp:  [10-18] 18 (08/29 0449) BP: (106-125)/(54-65) 112/56 (08/29 0449) SpO2:  [96 %-100 %] 96 % (08/29 0449) Weight:  [51 kg] 51 kg (08/28 1052) Last BM Date : 12/19/22  Intake/Output from previous day: 08/28 0701 - 08/29 0700 In: 2870 [P.O.:120; I.V.:2400; IV Piggyback:350] Out: 1950 [Urine:1850; Blood:100] Intake/Output this shift: No intake/output data recorded.  Gen: NAD, comfortable CV: RRR Pulm: Normal work of breathing Abd: Soft, nontender, not significantly distended Ext: SCDs in place  Lab Results: CBC  Recent Labs    12/21/22 0445  WBC 12.4*  HGB 12.0  HCT 35.5*  PLT 160   BMET Recent Labs    12/21/22 0445  NA 131*  K 4.1  CL 100  CO2 24  GLUCOSE 154*  BUN 9  CREATININE 0.77  CALCIUM 7.9*   PT/INR No results for input(s): "LABPROT", "INR" in the last 72 hours. ABG No results for input(s): "PHART", "HCO3" in the last 72 hours.  Invalid input(s): "PCO2", "PO2"  Studies/Results:  Anti-infectives: Anti-infectives (From admission, onward)    Start     Dose/Rate Route Frequency Ordered Stop   12/20/22 1530  metroNIDAZOLE (FLAGYL) IVPB 500 mg  Status:  Discontinued       Note to Pharmacy: Send to OR   500 mg 100 mL/hr over 60 Minutes Intravenous To Surgery 12/20/22 1514 12/20/22 1835   12/20/22 1030  cefOXitin (MEFOXIN) 2 g in sodium chloride 0.9 % 100 mL IVPB        2 g 200 mL/hr over 30 Minutes Intravenous On call to O.R. 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 IVF per Dr. [name] -PPx: SCDs, lovenox   LOS: 1 day   Check [email or web address] for General Surgery coverage night/weekend/holidays  No secure chat available for me given surgeries/clinic/off post call which would lead to a delay in care.    Marin Olp, MD [facility], [facility]

## 2022-12-21 NOTE — Progress Notes (Signed)
   12/21/22 1040  TOC Brief Assessment  Insurance and Status Reviewed  Patient has primary care physician Yes  Home environment has been reviewed Resides with spouse  Prior level of function: Independent at baseline  Prior/Current Home Services No current home services  Social Determinants of Health Reivew SDOH reviewed no interventions necessary  Readmission risk has been reviewed Yes  Transition of care needs no transition of care needs at this time

## 2022-12-21 NOTE — Progress Notes (Signed)
1 Day Post-Op Procedure(s) (LRB): DIAGNOSTIC LAPAROSCOPY, XI ROBOTIC ASSISTED PELVIC MASS EXCISION (N/A) RECTOSIGMOID RESECTION AND REANASTAMOSIS, MINI LAPAROTOMY (N/A) CYSTOSCOPY  Subjective: Patient reports doing well.  Minimal pain, mostly controlled with Tylenol.  Denies any nausea.  Feels rumbling although has not passed gas yet.  Ambulated earlier this morning.  Foley catheter removed while I was in the room.  Objective: Vital signs in last 24 hours: Temp:  [97.3 F (36.3 C)-98.7 F (37.1 C)] 98.6 F (37 C) (08/29 0449) Pulse Rate:  [61-81] 75 (08/29 0449) Resp:  [10-18] 18 (08/29 0449) BP: (106-125)/(54-65) 112/56 (08/29 0449) SpO2:  [96 %-100 %] 96 % (08/29 0449) Weight:  [112 lb 7 oz (51 kg)] 112 lb 7 oz (51 kg) (08/28 1052) Last BM Date : 12/19/22  Intake/Output from previous day: 08/28 0701 - 08/29 0700 In: 2870 [P.O.:120; I.V.:2400; IV Piggyback:350] Out: 1950 [Urine:1850; Blood:100]  Physical Examination: General: No acute distress, alert and oriented HEENT: Atraumatic, normocephalic Pulmonary: Lungs clear to auscultation bilaterally, no wheezes or rhonchi Cardiovascular: Regular rate and rhythm, no murmurs or rubs appreciated Abdomen: Soft, nondistended, good bowel sounds, nontender.  Incisions are clean dry and intact.  Honeycomb dressing over small lower abdominal incision.   Extremities: Warm and well-perfused, SCDs in place, no edema  Labs:    Latest Ref Rng & Units 12/21/2022    4:45 AM 11/22/2022    8:07 AM 09/05/2022    9:23 AM  CBC  WBC 4.0 - 10.5 K/uL 12.4  5.0  6.2   Hemoglobin 12.0 - 15.0 g/dL 96.0  45.4  09.8   Hematocrit 36.0 - 46.0 % 35.5  40.9  41.1   Platelets 150 - 400 K/uL 160  174  167       Latest Ref Rng & Units 12/21/2022    4:45 AM 11/22/2022    8:07 AM 09/05/2022    9:23 AM  BMP  Glucose 70 - 99 mg/dL 119  147  829   BUN 8 - 23 mg/dL 9  22  25    Creatinine 0.44 - 1.00 mg/dL 5.62  1.30  8.65   Sodium 135 - 145 mmol/L 131  140  139    Potassium 3.5 - 5.1 mmol/L 4.1  4.8  4.3   Chloride 98 - 111 mmol/L 100  105  105   CO2 22 - 32 mmol/L 24  30  29    Calcium 8.9 - 10.3 mg/dL 7.9  9.6  9.1    Assessment:  72 y.o. s/p Procedure(s): DIAGNOSTIC LAPAROSCOPY, XI ROBOTIC ASSISTED PELVIC MASS EXCISION RECTOSIGMOID RESECTION AND REANASTAMOSIS, MINI LAPAROTOMY CYSTOSCOPY: doing well, meeting early milestones.  Discussed with the patient in detail findings at the time of surgery as well as procedures performed.  Post-op: Advance diet as tolerated.  Once taking more fluid p.o., will DC IV fluids.  Await void after Foley catheter removed.  Encourage continued ambulation.  Awaiting return of bowel function.  Leukocytosis: Mild leukocytosis in the setting of recent surgery and preoperative steroids, reactive.  Hyponatremia: Likely in the setting of preoperative bowel prep.  Will continue IV fluids until improved p.o. intake.  Prophylaxis: Prophylactic Lovenox to start this morning.  Will plan on 2 weeks of anticoagulation.    Plan: Awaiting return of bowel function The patient is to be discharged to home.   LOS: 1 day    [name] 12/21/2022, 7:42 AM

## 2022-12-21 NOTE — Plan of Care (Signed)
  Problem: Education: Goal: Knowledge of General Education information will improve Description: Including pain rating scale, medication(s)/side effects and non-pharmacologic comfort measures Outcome: Progressing   Problem: Health Behavior/Discharge Planning: Goal: Ability to manage health-related needs will improve Outcome: Progressing   Problem: Clinical Measurements: Goal: Ability to maintain clinical measurements within normal limits will improve Outcome: Progressing Goal: Will remain free from infection Outcome: Progressing Goal: Diagnostic test results will improve Outcome: Progressing Goal: Respiratory complications will improve Outcome: Progressing Goal: Cardiovascular complication will be avoided Outcome: Progressing   Problem: Activity: Goal: Risk for activity intolerance will decrease Outcome: Progressing   Problem: Nutrition: Goal: Adequate nutrition will be maintained Outcome: Progressing   Problem: Coping: Goal: Level of anxiety will decrease Outcome: Progressing   Problem: Elimination: Goal: Will not experience complications related to bowel motility Outcome: Progressing Goal: Will not experience complications related to urinary retention Outcome: Progressing   Problem: Pain Managment: Goal: General experience of comfort will improve Outcome: Progressing   Problem: Safety: Goal: Ability to remain free from injury will improve Outcome: Progressing   Problem: Skin Integrity: Goal: Risk for impaired skin integrity will decrease Outcome: Progressing   Problem: Education: Goal: Knowledge of the prescribed therapeutic regimen will improve Outcome: Progressing Goal: Understanding of sexual limitations or changes related to disease process or condition will improve Outcome: Progressing Goal: Individualized Educational Video(s) Outcome: Progressing   Problem: Self-Concept: Goal: Communication of feelings regarding changes in body function or  appearance will improve Outcome: Progressing   Problem: Skin Integrity: Goal: Demonstration of wound healing without infection will improve Outcome: Progressing   

## 2022-12-22 ENCOUNTER — Other Ambulatory Visit (HOSPITAL_COMMUNITY): Payer: Self-pay

## 2022-12-22 LAB — BASIC METABOLIC PANEL
Anion gap: 5 (ref 5–15)
BUN: 11 mg/dL (ref 8–23)
CO2: 24 mmol/L (ref 22–32)
Calcium: 8 mg/dL — ABNORMAL LOW (ref 8.9–10.3)
Chloride: 102 mmol/L (ref 98–111)
Creatinine, Ser: 0.72 mg/dL (ref 0.44–1.00)
GFR, Estimated: >60 mL/min (ref >60)
Glucose, Bld: 117 mg/dL — ABNORMAL HIGH (ref 70–99)
Potassium: 4.1 mmol/L (ref 3.5–5.1)
Sodium: 131 mmol/L — ABNORMAL LOW (ref 135–145)

## 2022-12-22 LAB — CBC
HCT: 33.7 % — ABNORMAL LOW (ref 36.0–46.0)
Hemoglobin: 11.2 g/dL — ABNORMAL LOW (ref 12.0–15.0)
MCH: 30.4 pg (ref 26.0–34.0)
MCHC: 33.2 g/dL (ref 30.0–36.0)
MCV: 91.6 fL (ref 80.0–100.0)
Platelets: 122 10*3/uL — ABNORMAL LOW (ref 150–400)
RBC: 3.68 MIL/uL — ABNORMAL LOW (ref 3.87–5.11)
RDW: 12.4 % (ref 11.5–15.5)
WBC: 7.1 10*3/uL (ref 4.0–10.5)
nRBC: 0 % (ref 0.0–0.2)

## 2022-12-22 MED ORDER — APIXABAN 2.5 MG PO TABS
2.5000 mg | ORAL_TABLET | Freq: Two times a day (BID) | ORAL | 0 refills | Status: DC
  Filled 2022-12-22 (×2): qty 24, 12d supply, fill #0

## 2022-12-22 MED ORDER — ONDANSETRON HCL 4 MG PO TABS: 4.0000 mg | ORAL_TABLET | Freq: Three times a day (TID) | ORAL | 0 refills | Status: AC | PRN

## 2022-12-22 NOTE — Progress Notes (Signed)
AVS reviewed w/ Kennon Rounds who verbalized an understanding. No other questions at this time. PIV x 2 dc'd  as charted. Pt dressing for d/c to home w/ daughter & husband. Pt has Eliquis delivery from [employer].

## 2022-12-22 NOTE — Progress Notes (Signed)
2 Days Post-Op Procedure(s) (LRB): DIAGNOSTIC LAPAROSCOPY, XI ROBOTIC ASSISTED PELVIC MASS EXCISION (N/A) RECTOSIGMOID RESECTION AND REANASTAMOSIS, MINI LAPAROTOMY (N/A) CYSTOSCOPY  Subjective: Patient reports doing well. No pain reported. Had nausea last pm but felt this was related to being hungry. No emesis. Passing flatus. Had a small bowel movement. Blood noted when wiping with no bleeding afterwards. Ambulating without difficulty. Emptying bladder without issues. No concerns voiced.  Objective: Vital signs in last 24 hours: Temp:  [97.8 F (36.6 C)-98.6 F (37 C)] 97.8 F (36.6 C) (08/30 0502) Pulse Rate:  [62-82] 62 (08/30 0502) Resp:  [16-18] 18 (08/30 0502) BP: (111-128)/(54-59) 128/56 (08/30 0502) SpO2:  [97 %-98 %] 98 % (08/30 0502) Last BM Date : 12/19/22  Intake/Output from previous day: 08/29 0701 - 08/30 0700 In: 2663.6 [P.O.:1120; I.V.:1543.6] Out: 1250 [Urine:1250]  Physical Examination: General: No acute distress, alert and oriented HEENT: Atraumatic, normocephalic Pulmonary: Lungs clear to auscultation bilaterally, no wheezes or rhonchi Cardiovascular: Regular rate and rhythm, no murmurs or rubs appreciated Abdomen: Soft, nondistended, good bowel sounds, nontender.  Incisions are clean dry and intact.  Honeycomb dressing over small lower abdominal incision.   Extremities: Warm and well-perfused, SCDs in place, no edema  Labs:    Latest Ref Rng & Units 12/22/2022    4:43 AM 12/21/2022    4:45 AM 11/22/2022    8:07 AM  CBC  WBC 4.0 - 10.5 K/uL 7.1  12.4  5.0   Hemoglobin 12.0 - 15.0 g/dL 16.1  09.6  04.5   Hematocrit 36.0 - 46.0 % 33.7  35.5  40.9   Platelets 150 - 400 K/uL 122  160  174       Latest Ref Rng & Units 12/22/2022    4:43 AM 12/21/2022    4:45 AM 11/22/2022    8:07 AM  BMP  Glucose 70 - 99 mg/dL 409  811  914   BUN 8 - 23 mg/dL 11  9  22    Creatinine 0.44 - 1.00 mg/dL 7.82  9.56  2.13   Sodium 135 - 145 mmol/L 131  131  140   Potassium  3.5 - 5.1 mmol/L 4.1  4.1  4.8   Chloride 98 - 111 mmol/L 102  100  105   CO2 22 - 32 mmol/L 24  24  30    Calcium 8.9 - 10.3 mg/dL 8.0  7.9  9.6    Assessment: 72 y.o. s/p Procedure(s): DIAGNOSTIC LAPAROSCOPY, XI ROBOTIC ASSISTED PELVIC MASS EXCISION RECTOSIGMOID RESECTION AND REANASTAMOSIS, MINI LAPAROTOMY CYSTOSCOPY: doing well, meeting milestones.  Post-op: + return of bowel function. Plan for probable discharge later today.  Leukocytosis: Mild leukocytosis in the setting of recent surgery and preoperative steroids, reactive-improved.  Hyponatremia: Likely in the setting of preoperative bowel prep. Dietary interventions discussed.  Prophylaxis: On lovenox. Will plan on 2 weeks of anticoagulation.    Plan: If tolerating diet and continuing to meet milestones, will plan for discharge home this afternoon.   LOS: 2 days    Robin Sellers 12/22/2022, 8:35 AM

## 2022-12-22 NOTE — Discharge Summary (Addendum)
Physician Discharge Summary  Patient ID: Robin Sellers MRN: [id number] DOB/AGE: [birth year] 72 y.o.  Admit date: 12/20/2022 Discharge date: 12/22/2022  Admission Diagnoses: Ovarian cancer [facility])  Discharge Diagnoses:  Principal Problem:   Ovarian cancer [facility])   Discharged Condition:  The patient is in good condition and stable for discharge.    Hospital Course: On 12/20/2022, the patient underwent the following: Procedure(s): DIAGNOSTIC LAPAROSCOPY, XI ROBOTIC ASSISTED PELVIC MASS EXCISION, RECTOSIGMOID RESECTION AND REANASTAMOSIS, MINI LAPAROTOMY CYSTOSCOPY. The postoperative course was uneventful.  She was discharged to home on postoperative day 2 tolerating a regular diet, passing flatus, having bowel movements, minimal to no pain, ambulating, voiding. She is discharged home on prophylactic Eliquis for a total of 2 weeks.    Consults: None  Significant Diagnostic Studies: Labs  Treatments: Surgery: see above  Discharge Exam (am assessment): Blood pressure 121/61, pulse 72, temperature 98.4 F (36.9 C), temperature source Oral, resp. rate 17, height 5' 4.5" (1.638 m), weight 112 lb 7 oz (51 kg), SpO2 100%. General: No acute distress, alert and oriented HEENT: Atraumatic, normocephalic Pulmonary: Lungs clear to auscultation bilaterally, no wheezes or rhonchi Cardiovascular: Regular rate and rhythm, no murmurs or rubs appreciated Abdomen: Soft, nondistended, good bowel sounds, nontender.  Incisions are clean dry and intact.  Honeycomb dressing over small lower abdominal incision.   Extremities: Warm and well-perfused, SCDs in place, no edema  Disposition: Discharge disposition: 01-Home or Self Care       Discharge Instructions     Call MD for:  difficulty breathing, headache or visual disturbances   Complete by: As directed    Call MD for:  extreme fatigue   Complete by: As directed    Call MD for:  hives   Complete by: As directed    Call MD for:  persistant  dizziness or light-headedness   Complete by: As directed    Call MD for:  persistant nausea and vomiting   Complete by: As directed    Call MD for:  redness, tenderness, or signs of infection (pain, swelling, redness, odor or green/yellow discharge around incision site)   Complete by: As directed    Call MD for:  severe uncontrolled pain   Complete by: As directed    Call MD for:  temperature >100.4   Complete by: As directed    Diet - low sodium heart healthy   Complete by: As directed    Discharge wound care:   Complete by: As directed    You will have a white honeycomb dressing over your larger incision. This dressing can be removed 5 days after surgery and you do not need to reapply a new dressing. Once you remove the dressing, you will notice that you have the surgical glue (dermabond) on the incision and this will peel off on its own. You can get this dressing wet in the shower the days after surgery prior to removal on the 5th day.   Driving Restrictions   Complete by: As directed    No driving for 1 week(s).  Do not take narcotics and drive. You need to make sure your reaction time has returned.   Increase activity slowly   Complete by: As directed    Lifting restrictions   Complete by: As directed    No lifting greater than 10 lbs, pushing, pulling, straining for 6 weeks.   Sexual Activity Restrictions   Complete by: As directed    No sexual activity, nothing in the vagina, for 4-6 weeks.  Allergies as of 12/22/2022       Reactions   Artichoke Cindie Laroche Scolymus (artichoke)] Rash        Medication List     STOP taking these medications    bisacodyl 5 MG EC tablet Commonly known as: DULCOLAX   metroNIDAZOLE 500 MG tablet Commonly known as: FLAGYL   polyethylene glycol powder 17 GM/SCOOP powder Commonly known as: MiraLax   senna-docusate 8.6-50 MG tablet Commonly known as: Senokot-S       TAKE these medications    bimatoprost 0.03 % ophthalmic  solution Commonly known as: LATISSE Place into both eyes at bedtime. Place one drop on applicator and apply evenly along the skin of the upper eyelid at base of eyelashes once daily at bedtime; repeat procedure for second eye (use a clean applicator).   Eliquis 2.5 MG Tabs tablet Generic drug: apixaban Take 1 tablet (2.5 mg total) by mouth 2 (two) times daily. Start taking on: December 23, 2022   ondansetron 4 MG tablet Commonly known as: Zofran Take 1-2 tablets (4-8 mg total) by mouth every 8 (eight) hours as needed for nausea or vomiting.   risedronate 150 MG tablet Commonly known as: ACTONEL Take 150 mg by mouth every 30 (thirty) days. with water on empty stomach, nothing by mouth or lie down for next 30 minutes.   traMADol 50 MG tablet Commonly known as: ULTRAM Take 1 tablet (50 mg total) by mouth every 6 (six) hours as needed for severe pain. For AFTER surgery only, do not take and drive   tretinoin 1.610 % cream Commonly known as: RETIN-A Apply 1 application  topically at bedtime.               Discharge Care Instructions  (From admission, onward)           Start     Ordered   12/22/22 0000  Discharge wound care:       Comments: You will have a white honeycomb dressing over your larger incision. This dressing can be removed 5 days after surgery and you do not need to reapply a new dressing. Once you remove the dressing, you will notice that you have the surgical glue (dermabond) on the incision and this will peel off on its own. You can get this dressing wet in the shower the days after surgery prior to removal on the 5th day.   12/22/22 1413            Follow-up Information     [name], MD Follow up on 12/28/2022.   Specialty: Gynecologic Oncology Why: at 4:20pm will be a PHONE visit with Dr. [name]. IN PERSON visit will be on 01/19/23 at 4:15pm at the [facility] information: [address] [place] [address] [phone]          Andria Meuse, MD. Schedule an appointment as soon as possible for a visit in 3 week(s).   Specialties: General Surgery, Colon and Rectal Surgery Contact information: [address] [place] [address] [phone]                 Greater than thirty minutes were spend for face to face discharge instructions and discharge orders/summary in EPIC.   Signed: Doylene Bode 12/22/2022, 2:39 PM

## 2022-12-22 NOTE — Anesthesia Postprocedure Evaluation (Signed)
Anesthesia Post Note  Patient: Robin Sellers  Procedure(s) Performed: DIAGNOSTIC LAPAROSCOPY, XI ROBOTIC ASSISTED PELVIC MASS EXCISION (Abdomen) RECTOSIGMOID RESECTION AND REANASTAMOSIS, MINI LAPAROTOMY (Abdomen) CYSTOSCOPY (Vagina )     Patient location during evaluation: PACU Anesthesia Type: General Level of consciousness: awake and alert Pain management: pain level controlled Vital Signs Assessment: post-procedure vital signs reviewed and stable Respiratory status: spontaneous breathing, nonlabored ventilation, respiratory function stable and patient connected to nasal cannula oxygen Cardiovascular status: blood pressure returned to baseline and stable Postop Assessment: no apparent nausea or vomiting Anesthetic complications: no   No notable events documented.  Last Vitals:  Vitals:   12/21/22 2017 12/22/22 0502  BP: (!) 111/59 (!) 128/56  Pulse: 73 62  Resp: 18 18  Temp: 36.7 C 36.6 C  SpO2: 98% 98%    Last Pain:  Vitals:   12/22/22 0836  TempSrc:   PainSc: 2                  Kennieth Rad

## 2022-12-22 NOTE — Progress Notes (Signed)
  Subjective No acute events. Continues to do well. No complaints reported. Ambulating;+flatus and BM. Tolerating PO without difficulty  Objective: Vital signs in last 24 hours: Temp:  [97.8 F (36.6 C)-98.2 F (36.8 C)] 97.8 F (36.6 C) (08/30 0502) Pulse Rate:  [62-82] 62 (08/30 0502) Resp:  [16-18] 18 (08/30 0502) BP: (111-128)/(54-59) 128/56 (08/30 0502) SpO2:  [98 %] 98 % (08/30 0502) Last BM Date : 12/22/22  Intake/Output from previous day: 08/29 0701 - 08/30 0700 In: 2663.6 [P.O.:1120; I.V.:1543.6] Out: 1250 [Urine:1250] Intake/Output this shift: Total I/O In: 360 [P.O.:360] Out: 250 [Urine:250]  Gen: NAD, comfortable CV: RRR Pulm: Normal work of breathing Abd: Soft, nontender, not significantly distended. Incisions c/d/I without erythema nor drainage. Ext: SCDs in place  Lab Results: CBC  Recent Labs    12/21/22 0445 12/22/22 0443  WBC 12.4* 7.1  HGB 12.0 11.2*  HCT 35.5* 33.7*  PLT 160 122*   BMET Recent Labs    12/21/22 0445 12/22/22 0443  NA 131* 131*  K 4.1 4.1  CL 100 102  CO2 24 24  GLUCOSE 154* 117*  BUN 9 11  CREATININE 0.77 0.72  CALCIUM 7.9* 8.0*   PT/INR No results for input(s): "LABPROT", "INR" in the last 72 hours. ABG No results for input(s): "PHART", "HCO3" in the last 72 hours.  Invalid input(s): "PCO2", "PO2"  Studies/Results:  Anti-infectives: Anti-infectives (From admission, onward)    Start     Dose/Rate Route Frequency Ordered Stop   12/20/22 1530  metroNIDAZOLE (FLAGYL) IVPB 500 mg  Status:  Discontinued       Note to Pharmacy: Send to OR   500 mg 100 mL/hr over 60 Minutes Intravenous To Surgery 12/20/22 1514 12/20/22 1835   12/20/22 1030  cefOXitin (MEFOXIN) 2 g in sodium chloride 0.9 % 100 mL IVPB        2 g 200 mL/hr over 30 Minutes Intravenous On call to O.R. 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 Reviewed general postoperative expectations and goals. Our office will reach out for follow-up with me in next 2-4 weeks. Asked she call with any questions or concerns. Directions to our office given -PPx: SCDs, lovenox   LOS: 2 days   Check [email or web address] for General Surgery coverage night/weekend/holidays  No secure chat available for me given surgeries/clinic/off post call which would lead to a delay in care.    Marin Olp, MD [facility]

## 2022-12-22 NOTE — Plan of Care (Signed)
  Problem: Clinical Measurements: Goal: Will remain free from infection Outcome: Progressing Goal: Diagnostic test results will improve Outcome: Progressing   

## 2022-12-27 NOTE — Interval H&P Note (Signed)
History and Physical Interval Note:  12/27/2022 4:57 PM  Robin Sellers  has presented today for surgery, with the diagnosis of High grade serous ovarian cancer.  The various methods of treatment have been discussed with the patient and family. After consideration of risks, benefits and other options for treatment, the patient has consented to  Procedure(s): DIAGNOSTIC LAPAROSCOPY, XI ROBOTIC ASSISTED PELVIC MASS EXCISION (N/A) RECTOSIGMOID RESECTION AND REANASTAMOSIS, MINI LAPAROTOMY (N/A) CYSTOSCOPY as a surgical intervention.  The patient's history has been reviewed, patient examined, no change in status, stable for surgery.  I have reviewed the patient's chart and labs.  Questions were answered to the patient's satisfaction.     [name]

## 2022-12-28 ENCOUNTER — Other Ambulatory Visit: Payer: Self-pay | Admitting: Hematology and Oncology

## 2022-12-28 ENCOUNTER — Encounter: Payer: Self-pay | Admitting: Hematology and Oncology

## 2022-12-28 ENCOUNTER — Telehealth: Payer: Self-pay | Admitting: Oncology

## 2022-12-28 ENCOUNTER — Inpatient Hospital Stay: Payer: Medicare Other | Attending: Hematology and Oncology | Admitting: Gynecologic Oncology

## 2022-12-28 ENCOUNTER — Encounter: Payer: Self-pay | Admitting: Gynecologic Oncology

## 2022-12-28 DIAGNOSIS — Z90722 Acquired absence of ovaries, bilateral: Secondary | ICD-10-CM | POA: Insufficient documentation

## 2022-12-28 DIAGNOSIS — Z9889 Other specified postprocedural states: Secondary | ICD-10-CM

## 2022-12-28 DIAGNOSIS — R971 Elevated cancer antigen 125 [CA 125]: Secondary | ICD-10-CM | POA: Insufficient documentation

## 2022-12-28 DIAGNOSIS — C786 Secondary malignant neoplasm of retroperitoneum and peritoneum: Secondary | ICD-10-CM | POA: Insufficient documentation

## 2022-12-28 DIAGNOSIS — C482 Malignant neoplasm of peritoneum, unspecified: Secondary | ICD-10-CM

## 2022-12-28 DIAGNOSIS — C785 Secondary malignant neoplasm of large intestine and rectum: Secondary | ICD-10-CM | POA: Insufficient documentation

## 2022-12-28 DIAGNOSIS — C772 Secondary and unspecified malignant neoplasm of intra-abdominal lymph nodes: Secondary | ICD-10-CM | POA: Insufficient documentation

## 2022-12-28 DIAGNOSIS — C563 Malignant neoplasm of bilateral ovaries: Secondary | ICD-10-CM | POA: Insufficient documentation

## 2022-12-28 DIAGNOSIS — Z7189 Other specified counseling: Secondary | ICD-10-CM

## 2022-12-28 DIAGNOSIS — C7982 Secondary malignant neoplasm of genital organs: Secondary | ICD-10-CM | POA: Insufficient documentation

## 2022-12-28 DIAGNOSIS — Z5111 Encounter for antineoplastic chemotherapy: Secondary | ICD-10-CM | POA: Insufficient documentation

## 2022-12-28 DIAGNOSIS — Z90710 Acquired absence of both cervix and uterus: Secondary | ICD-10-CM | POA: Insufficient documentation

## 2022-12-28 DIAGNOSIS — N952 Postmenopausal atrophic vaginitis: Secondary | ICD-10-CM | POA: Insufficient documentation

## 2022-12-28 LAB — CYTOLOGY - NON PAP

## 2022-12-28 LAB — SURGICAL PATHOLOGY

## 2022-12-28 NOTE — Telephone Encounter (Signed)
Robin Sellers called back and wants to think about port placement.  She will call back with her decision.

## 2022-12-28 NOTE — Progress Notes (Signed)
ON PATHWAY REGIMEN - Ovarian  No Change  Continue With Treatment as Ordered.  Original Decision Date/Time: 11/16/2020 14:48     A cycle is every 21 days:     Paclitaxel      Carboplatin   **Always confirm dose/schedule in your pharmacy ordering system**  Patient Characteristics: Preoperative or Nonsurgical Candidate (Clinical Staging), Newly Diagnosed, Neoadjuvant Therapy followed by Surgery BRCA Mutation Status: Awaiting Test Results Therapeutic Status: Preoperative or Nonsurgical Candidate (Clinical Staging) AJCC T Category: cT3 AJCC 8 Stage Grouping: Unknown AJCC N Category: cN0 AJCC M Category: cM0 Therapy Plan: Neoadjuvant Therapy followed by Surgery Intent of Therapy: Curative Intent, Discussed with Patient

## 2022-12-28 NOTE — Progress Notes (Signed)
Gynecologic Oncology Telehealth Note: Gyn-Onc  I connected with Robin Sellers on 12/28/22 at  4:20 PM EDT by telephone and verified that I am speaking with the correct person using two identifiers.  I discussed the limitations, risks, security and privacy concerns of performing an evaluation and management service by telemedicine and the availability of in-person appointments. I also discussed with the patient that there may be a patient responsible charge related to this service. The patient expressed understanding and agreed to proceed.  Other persons participating in the visit and their role in the encounter: none.  Patient's location: home Provider's location: [facility]  Reason for Visit: follow-up   Treatment History: Oncology History Overview Note  Neg genetics   Primary peritoneal carcinomatosis (HCC)  08/21/2020 Initial Diagnosis   The patient reports intermittent right upper quadrant pain that feel like she pulled a muscle beginning in March 2022.  In April, she woke up with sharp pain in her right upper quadrant.  This was her first episode of sharp pain.  She thought that her pain was related to her gallbladder and presented to the emergency department.  She was seen in ED on 4/30. RUQ ultrasound and x-ray were normal as were LFTs and lipase.    10/27/2020 Imaging   Findings highly suspicious for peritoneal carcinomatosis. No site of primary malignancy identified.   Colonic diverticulosis, without radiographic evidence of diverticulitis   11/02/2020 Initial Diagnosis   Primary peritoneal carcinomatosis (HCC)   11/02/2020 Tumor Marker   Patient's tumor was tested for the following markers: CA-125. Results of the tumor marker test revealed 88.   11/11/2020 Pathology Results   FINAL MICROSCOPIC DIAGNOSIS:   A. CUL DE SAC, LEFT ANTERIOR, EXCISION:  -  Poorly differentiated carcinoma  -  See comment   B. SIDEWALL, RIGHT, BIOPSY:  -  Poorly differentiated carcinoma  -   See comment   COMMENT:   By immunohistochemistry, the neoplastic cells are positive for cytokeratin 7, p53, PAX8 and WT1 but negative for cytokeratin 20, GATA3, CDX2 and TTF-1.  Overall, the immunophenotype is consistent with a  gynecologic primary.    11/11/2020 Pathology Results   FINAL MICROSCOPIC DIAGNOSIS:  - Malignant cells consistent with metastatic adenocarcinoma    11/11/2020 Surgery   Pre-operative Diagnosis: Carcinomatosis, mildly elevated CA-125   Post-operative Diagnosis: same, At least stage IIIC gyn malignancy   Operation: Diagnostic laparoscopy, peritoneal biopsies    Surgeon: Eugene Garnet MD Operative Findings: On EUA, small mobile uterus. On intra-abdominal entry, carcinomatosis appreciated studding the right anterior diaphragm, bilateral liver surfaces, mesentery, pelvic peritoneum. Omental cake with measuring up to 2x3cm. Right ovary adherent to the right uterus with peritoneal studding adjacent. Frondular implants noted along right pelvic sidewall. Miliary disease along anterior cul de sac. Cul de sac covered with friable miliary disease. Small volume dark amber ascites. Specimens: left anterior cul de sac peritoneum, right pelvic sidewall peritoneal biopsy          11/15/2020 Cancer Staging   Staging form: Ovary, Fallopian Tube, and Primary Peritoneal Carcinoma, AJCC 8th Edition - Clinical stage from 11/15/2020: FIGO Stage III (cT3, cN0, cM0) - Signed by Artis Delay, MD on 11/15/2020 Stage prefix: Initial diagnosis   11/26/2020 - 05/13/2021 Chemotherapy   Patient is on Treatment Plan : OVARIAN Carboplatin (AUC 6) / Paclitaxel (175) q21d x 6 cycles     11/26/2020 Tumor Marker   Patient's tumor was tested for the following markers: CA-125. Results of the tumor marker test revealed 76.8.  12/17/2020 Tumor Marker   Patient's tumor was tested for the following markers: CA-125. Results of the tumor marker test revealed 58.1.   01/07/2021 Tumor Marker   Patient's tumor  was tested for the following markers: CA-125. Results of the tumor marker test revealed 21.6.   01/20/2021 Imaging   Decreased omental soft tissue nodularity and caking, consistent with interval improvement in carcinoma.   No evidence of new or progressive disease within the abdomen or pelvis.   Colonic diverticulosis. No radiographic evidence of diverticulitis.   Large stool burden noted; recommend clinical correlation for possible constipation   02/24/2021 Surgery   Robotic-assisted laparoscopic total hysterectomy with bilateral salpingoophorectomy, peritoneal stripping, mini-lap for omentectomy and intra-abdominal palpation  Findings: On EUA, small mobile uterus. Rectum free. On intra-abdominal entry, normal upper abdominal survey including liver edge, diaphragm and stomach. Omentum with several small (<2cm) areas of thickening, suspected treated tumor versus residual tumor. Uterus 6cm and normal in appearance. Atrophic appearing bilateral adnexa. Some adhesions of the left ovary to the medial leaf of the broad ligament. Minimal peritoneal nodularity versus treated disease within posterior cul de sac and deep left pelvis, all either excised or ablated. No significant peritoneal disease or carcinomatosis. No sigmoid or rectal involvement. Small bowel normal in appearance. No adenopathy. No ascites. R0 resection.    02/24/2021 Pathology Results   A. PERTIONEAL SCAR, EXCISION:  - Microscopic focus of high-grade serous carcinoma   B. 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 OMENTUM, RESECTION:  - Microscopic foci of high-grade serous carcinoma   03/14/2021 Tumor Marker   Patient's tumor was tested for the following markers: CA-125. Results of the tumor marker test  revealed 32.7.   03/23/2021 Imaging   Unremarkable right upper quadrant ultrasound   04/01/2021 Genetic Testing   HRD status through [employer] was negative.  The report date is 04/01/2021. Negative hereditary cancer genetic testing: no pathogenic variants detected in [employer].  The report date is 04/04/2021.  The [facility] gene panel offered by [employer] includes sequencing and deletion/duplication testing of the following 48 genes: APC, ATM, AXIN2, BAP1, BARD1, BMPR1A, BRCA1, BRCA2, BRIP1, CHD1, CDK4, CDKN2A(p16 and p14ARF), CHEK2, CTNNA1, EGFR, EPCAM, FH, FLCN, GREM1, HOXB13, MEN1, MET, MITF , MLH1, MSH2, MSH3, MSH6, MUTYH, NTHL1, PALB2, PMS2, POLD1, POLE, PTEN, RAD51C, RAD51D, RET, SDHA, SDHB, SDHC, SDHD, SMAD4, STK11,TERT, TP53, TSC1, TSC2, and VHL.   04/12/2021 Tumor Marker   Patient's tumor was tested for the following markers: CA-125. Results of the tumor marker test revealed 12.7.   06/13/2021 Imaging   1. No new mass or lymphadenopathy identified in the abdomen or pelvis. Interval resolution of previous omental soft tissue caking. 2. Other ancillary findings.   06/13/2021 Tumor Marker   Patient's tumor was tested for the following markers: CA-125. Results of the tumor marker test revealed 10.1.   07/11/2021 Tumor Marker   Patient's tumor was tested for the following markers: CA-125. Results of the tumor marker test revealed 9.4.   09/08/2021 Imaging   1. Status post hysterectomy and omentectomy. 2. No evidence of lymphadenopathy or metastatic disease in the abdomen or pelvis.     09/08/2021 Tumor Marker   Patient's tumor was tested for the following markers: CA-125. Results of the tumor marker test revealed  9.0.   02/22/2022 Imaging   IMPRESSION: 1. No acute findings.  No ascites, mass or adenopathy. 2.  Aortic Atherosclerosis (ICD10-170.0).   03/10/2022 Tumor Marker   Patient's tumor was tested for the following markers: CA-125. Results of the  tumor marker test revealed 11.7.   06/09/2022 Tumor Marker   Patient's tumor was tested for the following markers: CA-125. Results of the tumor marker test revealed 29.2.   09/07/2022 Tumor Marker   Patient's tumor was tested for the following markers: CA-125. Results of the tumor marker test revealed 39.7.   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 RADIATION DOSE REDUCTION: This exam was performed according to the departmental dose-optimization program which includes automated exposure control, adjustment of the mA and/or kV according to patient size and/or use of iterative reconstruction technique. CONTRAST:  60mL OMNIPAQUE IOHEXOL 350 MG/ML SOLN COMPARISON:  06/22/2022 and previous FINDINGS: Lower chest: No pleural or pericardial effusion. Visualized lung bases grossly clear, with some motion degradation. Hepatobiliary: No focal liver abnormality is seen. No gallstones, gallbladder wall thickening, or biliary dilatation. Pancreas: Unremarkable. No pancreatic ductal dilatation or surrounding inflammatory changes. Spleen: Normal in size without focal abnormality. Accessory splenule. Adrenals/Urinary Tract: No adrenal mass. Symmetric renal parenchymal enhancement without urolithiasis or hydronephrosis. Urinary bladder incompletely distended. Stomach/Bowel: Stomach incompletely distended, unremarkable. Small bowel decompressed. Post appendectomy. Gas and fecal material partially distend the colon, without acute finding. A few scattered diverticula in the sigmoid segment. Vascular/Lymphatic: Minimal calcified aortic plaque without aneurysm or stenosis. Portal vein patent. A few scattered mesenteric lymph nodes none  greater than 4 mm short axis diameter. No abdominal or pelvic adenopathy. Reproductive: Status post hysterectomy. No adnexal masses. No peritoneal nodularity. Other: No ascites. No free air. Bilateral pelvic phleboliths stable. Musculoskeletal: Stable lower lumbar spondylitic change with early grade and grade 1 anterolisthesis L4-5 and degenerative disc disease L5-S1. No fracture or worrisome bone lesion. IMPRESSION: 1. No acute findings. 2. No evidence of recurrent or metastatic disease. 3. Sigmoid diverticulosis. Electronically Signed   By: Corlis Leak M.D.   On: 09/15/2022 20:09      11/24/2022 Tumor Marker   Patient's tumor was tested for the following markers: CA-125. Results of the tumor marker test revealed 47.6.   12/04/2022 Imaging   CT A/P 1. A new 2.4 x 2.1 cm low attenuating structure in the right pelvis abutting the vaginal cuff concerning for recurrent disease, given rise in CA 125. Clinical correlation is recommended. 2. No bowel obstruction. Appendectomy. 3.  Aortic Atherosclerosis (ICD10-I70.0).   12/20/2022 Surgery   Robotic-assisted excision of pelvic mass requiring colpotomy with vaginal cuff closure [name]), excision of left para-colic gutter implant (White), low anterior resection with reanastomosis [name]), cystoscopy [name])   Findings: On EUA, 2-3 cm mass is palpable in the vaginal apex, and close proximity to the rectum but without direct involvement.  Intra-abdominal entry, normal-appearing diaphragm, liver edge, and stomach.  No peritoneal disease or implants noted.  Normal-appearing small bowel.  Surgically absent uterus, cervix, fallopian tubes and ovaries.  Small, 5 mm cystic nodule noted along the left paracolic gutter lateral to the sigmoid colon (excised).  Within the pelvis, there was a 3 cm cystic mass, apparently bilobed, adherent to the posterior aspect of the vaginal  cuff in the midline into the right.  This was close to but not directly involving the rectum nor  much of the rectal mesentery.  There was minimal spillage from the cystic lesion during manipulation to excise the specimen.  Upon resection of this area, the more superior aspect of the rectum was identified as having a similar appearing 1-1.5 cm cystic mass involving the rectum and adjacent mesentery.  The entire lesion was removed within the segment of rectosigmoid colon.  No additional implants or findings concerning for metastatic disease were noted in the abdomen or pelvis. R0 resection. On cystoscopy, bladder dome intact. Good efflux noted from bilateral ureteral orifices.   12/20/2022 Pathology Results   A. PELVIC MASS, EXCISION:  - High-grade serous carcinoma, involving vaginal mucosa and adjacent  soft tissue   B. LEFT PERICOLIC GUTTER NODULE, EXCISION:  - High-grade serous carcinoma  - Resection margin is negative for carcinoma   C. RECTOSIGMOID COLON, RESECTION:  - High-grade serous carcinoma involving segment of rectosigmoid colon  - Lymph node, negative for carcinoma (0/1)  - Resection margins, negative for carcinoma   D. FINAL DISTAL MARGIN OF ANASTOMOTIC RING, EXCISION:  - Colonic donut, negative for carcinoma   Cytology FINAL MICROSCOPIC DIAGNOSIS:  - Malignant cells present  - See comment     01/19/2023 -  Chemotherapy   Patient is on Treatment Plan : OVARIAN Carboplatin (AUC 6) + Paclitaxel (175) q21d X 6 Cycles      Interval History: Didn't sleep well Thurs/Fri. Finally started to feel better over the weekend. Bowels moving well. No bleeding with BMs. Minimal spotting from vagina (only when wipes). Denies urinary symptoms. Walking again - 3/4 miles.   Past Medical/Surgical History: Past Medical History:  Diagnosis Date   Anemia    Cancer (HCC)    ovarian   Family history of breast cancer 03/14/2021   Hole, retinal    Right eye   Osteoporosis    Polyp of colon, unspecified part of colon, unspecified type    Benign    Past Surgical History:   Procedure Laterality Date   APPENDECTOMY  02/27/2007   lsc   BILATERAL SALPINGECTOMY Right 03/1978   right ectopic, tied other tube   BOWEL RESECTION N/A 12/20/2022   Procedure: RECTOSIGMOID RESECTION AND REANASTAMOSIS, MINI LAPAROTOMY;  Surgeon: [name], MD;  Location: [facility];  Service: Gynecology;  Laterality: N/A;   CATARACT EXTRACTION Right 10/31/2011   Dr. Dione Booze   COLONOSCOPY  03/2019   Dr. Levora Angel   COLONOSCOPY WITH PROPOFOL N/A 04/01/2018   Procedure: COLONOSCOPY WITH PROPOFOL;  Surgeon: Kathi Der, MD;  Location: [facility];  Service: Gastroenterology;  Laterality: N/A;   CYSTOSCOPY  12/20/2022   Procedure: CYSTOSCOPY;  Surgeon: [name], MD;  Location: [facility];  Service: Gynecology;;   DILATION AND CURETTAGE OF UTERUS     x3, 2 for miscarriages, one for polyp vs fibroid   HAND / FINGER LESION EXCISION Left    lt. index finger   LAPAROSCOPIC HYSTERECTOMY     LAPAROSCOPY N/A 11/11/2020   Procedure: LAPAROSCOPY DIAGNOSTIC WITH PERITONEAL BIOPSY;  Surgeon: [name], MD;  Location: [facility];  Service: Gynecology;  Laterality: N/A;   POLYPECTOMY  04/01/2018   Procedure: POLYPECTOMY;  Surgeon: Kathi Der, MD;  Location: [facility];  Service: Gastroenterology;;   RETINAL TEAR REPAIR CRYOTHERAPY Right 04/25/2011   Dr. Luciana Axe   ROBOT ASSISTED MYOMECTOMY N/A 12/20/2022   Procedure: DIAGNOSTIC LAPAROSCOPY, XI ROBOTIC ASSISTED PELVIC MASS EXCISION;  Surgeon: [name], MD;  Location: [facility];  Service: Gynecology;  Laterality: N/A;   SUBMUCOSAL INJECTION  04/01/2018   Procedure: SUBMUCOSAL INJECTION;  Surgeon: Kathi Der, MD;  Location: [facility];  Service: Gastroenterology;;   TUBAL LIGATION Left 03/1978    Family History  Problem Relation Age of Onset   Lung cancer Mother 59       smoking hx   Skin cancer Father        dx after 50; non-melanoma; scalp   Lung cancer Maternal Aunt        dx after 50; smoking hx    Breast cancer Daughter 40       ER+   Skin cancer Cousin        face; surgery only   Ovarian cancer Neg Hx    Endometrial cancer Neg Hx    Prostate cancer Neg Hx    Pancreatic cancer Neg Hx    Colon cancer Neg Hx     Social History   Socioeconomic History   Marital status: Legally Separated    Spouse name: Not on file   Number of children: Not on file   Years of education: Not on file   Highest education level: Not on file  Occupational History   Not on file  Tobacco Use   Smoking status: Never    Passive exposure: Past (18 years)   Smokeless tobacco: Never  Vaping Use   Vaping status: Never Used  Substance and Sexual Activity   Alcohol use: Not Currently   Drug use: Never   Sexual activity: Not Currently  Other Topics Concern   Not on file  Social History Narrative   Not on file   Social Determinants of Health   Financial Resource Strain: Not on file  Food Insecurity: No Food Insecurity (12/21/2022)   Hunger Vital Sign    Worried About Running Out of Food in the Last Year: Never true    Ran Out of Food in the Last Year: Never true  Transportation Needs: No Transportation Needs (12/21/2022)   PRAPARE - Administrator, Civil Service (Medical): No    Lack of Transportation (Non-Medical): No  Physical Activity: Not on file  Stress: Not on file  Social Connections: Not on file    Current Medications:  Current Outpatient Medications:    apixaban (ELIQUIS) 2.5 MG TABS tablet, Take 1 tablet (2.5 mg total) by mouth 2 (two) times daily., Disp: 24 tablet, Rfl: 0   bimatoprost (LATISSE) 0.03 % ophthalmic solution, Place into both eyes at bedtime. Place one drop on applicator and apply evenly along the skin of the upper eyelid at base of eyelashes once daily at bedtime; repeat procedure for second eye (use a clean applicator)., Disp: , Rfl:    ondansetron (ZOFRAN) 4 MG tablet, Take 1-2 tablets (4-8 mg total) by mouth every 8 (eight) hours as needed for nausea  or vomiting., Disp: 10 tablet, Rfl: 0   risedronate (ACTONEL) 150 MG tablet, Take 150 mg by mouth every 30 (thirty) days. with water on empty stomach, nothing by mouth or lie down for next 30 minutes., Disp: , Rfl:    traMADol (ULTRAM) 50 MG tablet, Take 1 tablet (50 mg total) by mouth every 6 (six) hours as needed for severe pain. For AFTER surgery only, do not take and drive, Disp: 10 tablet, Rfl: 0   tretinoin (RETIN-A) 0.025 % cream, Apply 1 application  topically at bedtime., Disp: , Rfl:  Review of Symptoms: Pertinent positives as per HPI.  Physical Exam: Deferred given limitations of phone visit.  Laboratory & Radiologic Studies: Cytology FINAL MICROSCOPIC DIAGNOSIS:  - Malignant cells present  - See comment   A. PELVIC MASS, EXCISION:  - High-grade serous carcinoma, involving vaginal mucosa and adjacent  soft tissue   B. LEFT PERICOLIC GUTTER NODULE, EXCISION:  - High-grade serous carcinoma  - Resection margin is negative for carcinoma   C. RECTOSIGMOID COLON, RESECTION:  - High-grade serous carcinoma involving segment of rectosigmoid colon  - Lymph node, negative for carcinoma (0/1)  - Resection margins, negative for carcinoma   D. FINAL DISTAL MARGIN OF ANASTOMOTIC RING, EXCISION:  - Colonic donut, negative for carcinoma   Assessment & Plan: Robin Sellers is a 72 y.o. 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 R0 debulking. CARIS: ER+, HRD neg, PDL1 CPS 5, P53 abn. ERBB2 negative (I[facility] 0). FOLR1 40% (negative).  The patient is overall doing very well, meeting postoperative milestones.  Discussed continued expectations and restrictions.  She is having minimal vaginal spotting.  She will call if this increases at all.  She is already scheduled to see Dr. Bertis Ruddy back and will restart chemotherapy at the end of the month with a platinum doublet.  I discussed the assessment and treatment plan with the  patient. The patient was provided with an opportunity to ask questions and all were answered. The patient agreed with the plan and demonstrated an understanding of the instructions.   The patient was advised to call back or see an in-person evaluation if the symptoms worsen or if the condition fails to improve as anticipated.   8 minutes of total time was spent for this patient encounter, including preparation, phone counseling with the patient and coordination of care, and documentation of the encounter.   Eugene Garnet, MD  Division of Gynecologic Oncology  Department of Obstetrics and Gynecology  [facility]

## 2022-12-28 NOTE — Telephone Encounter (Signed)
I sent LOS to see her 9/26 and chemo on 9/27

## 2022-12-28 NOTE — Telephone Encounter (Signed)
Robin Sellers and asked if she can see Dr. Bertis Ruddy on 01/12/23 and start chemo on 01/16/23.  She said she can come in on 9/20 but is wondering if she can have chemo on Fridays.  It works better for her daughter who comes to stay with her overnight after chemo.

## 2022-12-29 ENCOUNTER — Ambulatory Visit: Payer: Medicare Other

## 2022-12-29 ENCOUNTER — Other Ambulatory Visit: Payer: Self-pay

## 2022-12-29 ENCOUNTER — Other Ambulatory Visit: Payer: Self-pay | Admitting: Hematology and Oncology

## 2022-12-29 ENCOUNTER — Telehealth: Payer: Self-pay | Admitting: Oncology

## 2022-12-29 NOTE — Telephone Encounter (Signed)
Called Robin Sellers and advised her that her post op with Dr. [name] has been rescheduled to 01/19/2023 at 8:15.  She verbalized understanding and agreement.

## 2023-01-01 ENCOUNTER — Other Ambulatory Visit: Payer: Self-pay | Admitting: Oncology

## 2023-01-01 NOTE — Progress Notes (Signed)
Gynecologic Oncology Multi-Disciplinary Disposition Conference Note  Date of the Conference: 01/01/2023  Patient Name: Robin Sellers  Primary GYN Oncologist: Dr. 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 Disposition is for platinum based doublet therapy.   This Multidisciplinary conference took place involving physicians from Gynecologic Oncology, Medical Oncology, Radiation Oncology, Pathology, Radiology along with the Gynecologic Oncology Nurse Practitioner and Gynecologic Oncology Nurse Navigator.  Comprehensive assessment of the patient's malignancy, staging, need for surgery, chemotherapy, radiation therapy, and need for further testing were reviewed. Supportive measures, both inpatient and following discharge were also discussed. The recommended plan of care is documented. Greater than 35 minutes were spent correlating and coordinating this patient's care.

## 2023-01-02 ENCOUNTER — Encounter: Payer: Self-pay | Admitting: Hematology and Oncology

## 2023-01-07 ENCOUNTER — Other Ambulatory Visit: Payer: Self-pay

## 2023-01-09 ENCOUNTER — Telehealth: Payer: Self-pay

## 2023-01-09 NOTE — Telephone Encounter (Signed)
Called and given below message. She verbalized understanding.

## 2023-01-09 NOTE — Telephone Encounter (Signed)
Yes ok to proceed

## 2023-01-09 NOTE — Telephone Encounter (Signed)
She is calling to ask if it is okay to have her eyebrows tattooed on 9/18? She is scheduled for first chemo on 9/27.  She will do whatever Dr. Bertis Ruddy recommends.

## 2023-01-12 ENCOUNTER — Other Ambulatory Visit: Payer: Medicare Other

## 2023-01-12 ENCOUNTER — Encounter: Payer: Self-pay | Admitting: Hematology and Oncology

## 2023-01-12 ENCOUNTER — Inpatient Hospital Stay (HOSPITAL_BASED_OUTPATIENT_CLINIC_OR_DEPARTMENT_OTHER): Payer: Medicare Other | Admitting: Hematology and Oncology

## 2023-01-12 VITALS — BP 124/58 | HR 72 | Temp 97.4°F | Resp 18 | Ht 64.5 in | Wt 115.4 lb

## 2023-01-12 DIAGNOSIS — R971 Elevated cancer antigen 125 [CA 125]: Secondary | ICD-10-CM | POA: Diagnosis not present

## 2023-01-12 DIAGNOSIS — C786 Secondary malignant neoplasm of retroperitoneum and peritoneum: Secondary | ICD-10-CM | POA: Diagnosis not present

## 2023-01-12 DIAGNOSIS — C772 Secondary and unspecified malignant neoplasm of intra-abdominal lymph nodes: Secondary | ICD-10-CM | POA: Diagnosis not present

## 2023-01-12 DIAGNOSIS — Z90710 Acquired absence of both cervix and uterus: Secondary | ICD-10-CM | POA: Diagnosis not present

## 2023-01-12 DIAGNOSIS — Z90722 Acquired absence of ovaries, bilateral: Secondary | ICD-10-CM | POA: Diagnosis not present

## 2023-01-12 DIAGNOSIS — C785 Secondary malignant neoplasm of large intestine and rectum: Secondary | ICD-10-CM | POA: Diagnosis not present

## 2023-01-12 DIAGNOSIS — C7982 Secondary malignant neoplasm of genital organs: Secondary | ICD-10-CM | POA: Diagnosis not present

## 2023-01-12 DIAGNOSIS — C482 Malignant neoplasm of peritoneum, unspecified: Secondary | ICD-10-CM

## 2023-01-12 DIAGNOSIS — Z5111 Encounter for antineoplastic chemotherapy: Secondary | ICD-10-CM | POA: Diagnosis present

## 2023-01-12 DIAGNOSIS — C563 Malignant neoplasm of bilateral ovaries: Secondary | ICD-10-CM | POA: Diagnosis present

## 2023-01-12 DIAGNOSIS — N952 Postmenopausal atrophic vaginitis: Secondary | ICD-10-CM | POA: Diagnosis not present

## 2023-01-12 MED ORDER — PROCHLORPERAZINE MALEATE 10 MG PO TABS
10.0000 mg | ORAL_TABLET | Freq: Four times a day (QID) | ORAL | 1 refills | Status: AC | PRN
Start: 2023-01-15 — End: ?

## 2023-01-12 MED ORDER — DEXAMETHASONE 4 MG PO TABS: ORAL_TABLET | ORAL | 6 refills | Status: DC

## 2023-01-12 NOTE — Progress Notes (Signed)
[facility] OFFICE PROGRESS NOTE  Patient Care Team: Burton Apley, MD as PCP - General (Internal Medicine) Paulina Fusi Servando Snare, RN as Oncology Nurse Navigator (Oncology)  ASSESSMENT & PLAN:  Primary peritoneal carcinomatosis [facility]) I have reviewed her surgical report and pathology report We discussed the role of adjuvant treatment Previously, she tolerated chemotherapy well without major side effects We reviewed the NCCN guidelines We discussed the role of chemotherapy. The intent is of curative intent.  We discussed some of the risks, benefits, side-effects of carboplatin & Taxol. Treatment is intravenous, every 3 weeks x 6 cycles  Some of the short term side-effects included, though not limited to, including weight loss, life threatening infections, risk of allergic reactions, need for transfusions of blood products, nausea, vomiting, change in bowel habits, loss of hair, admission to hospital for various reasons, and risks of death.   Long term side-effects are also discussed including risks of infertility, permanent damage to nerve function, hearing loss, chronic fatigue, kidney damage with possibility needing hemodialysis, and rare secondary malignancy including bone marrow disorders.  The patient is aware that the response rates discussed earlier is not guaranteed.  After a long discussion, patient made an informed decision to proceed with the prescribed plan of care.   Patient education material was dispensed. We discussed premedication with dexamethasone before chemotherapy. She has good venous access and declined port placement I plan to repeat imaging study after completion of adjuvant treatment  No orders of the defined types were placed in this encounter.   All questions were answered. The patient knows to call the clinic with any problems, questions or concerns. The total time spent in the appointment was 40 minutes encounter with patients including review of  chart and various tests results, discussions about plan of care and coordination of care plan   Artis Delay, MD 01/12/2023 10:30 AM  INTERVAL HISTORY: Please see below for problem oriented charting. she returns for treatment follow-up with her family She recovered well after surgery Denies abdominal pain or changes in bowel habits We discussed recent pathology report and the role of adjuvant treatment  REVIEW OF SYSTEMS:   Constitutional: Denies fevers, chills or abnormal weight loss Eyes: Denies blurriness of vision Ears, nose, mouth, throat, and face: Denies mucositis or sore throat Respiratory: Denies cough, dyspnea or wheezes Cardiovascular: Denies palpitation, chest discomfort or lower extremity swelling Gastrointestinal:  Denies nausea, heartburn or change in bowel habits Skin: Denies abnormal skin rashes Lymphatics: Denies new lymphadenopathy or easy bruising Neurological:Denies numbness, tingling or new weaknesses Behavioral/Psych: Mood is stable, no new changes  All other systems were reviewed with the patient and are negative.  I have reviewed the past medical history, past surgical history, social history and family history with the patient and they are unchanged from previous note.  ALLERGIES:  is allergic to artichoke [cynara scolymus (artichoke)].  MEDICATIONS:  Current Outpatient Medications  Medication Sig Dispense Refill   dexamethasone (DECADRON) 4 MG tablet Take 2 tabs at the night before and 2 tab the morning of chemotherapy, every 3 weeks, by mouth x 6 cycles 24 tablet 6   ondansetron (ZOFRAN) 4 MG tablet Take 1-2 tablets (4-8 mg total) by mouth every 8 (eight) hours as needed for nausea or vomiting. 10 tablet 0   [START ON 01/15/2023] prochlorperazine (COMPAZINE) 10 MG tablet Take 1 tablet (10 mg total) by mouth every 6 (six) hours as needed for nausea or vomiting. 30 tablet 1   risedronate (ACTONEL) 150 MG tablet  Take 150 mg by mouth every 30 (thirty) days. with  water on empty stomach, nothing by mouth or lie down for next 30 minutes.     tretinoin (RETIN-A) 0.025 % cream Apply 1 application  topically at bedtime.     No current facility-administered medications for this visit.    SUMMARY OF ONCOLOGIC HISTORY: Oncology History Overview Note  Neg genetics,  Molecular testing through [facility] in 2024:  HER2/neu 0, negative F0 LR 2+, 40%, negative PD-L1 CPS score 5% ER +75% HRD negative   Primary peritoneal carcinomatosis (HCC)  08/21/2020 Initial Diagnosis   The patient reports intermittent right upper quadrant pain that feel like she pulled a muscle beginning in March 2022.  In April, she woke up with sharp pain in her right upper quadrant.  This was her first episode of sharp pain.  She thought that her pain was related to her gallbladder and presented to the emergency department.  She was seen in ED on 4/30. RUQ ultrasound and x-ray were normal as were LFTs and lipase.    10/27/2020 Imaging   Findings highly suspicious for peritoneal carcinomatosis. No site of primary malignancy identified.   Colonic diverticulosis, without radiographic evidence of diverticulitis   11/02/2020 Initial Diagnosis   Primary peritoneal carcinomatosis (HCC)   11/02/2020 Tumor Marker   Patient's tumor was tested for the following markers: CA-125. Results of the tumor marker test revealed 88.   11/11/2020 Pathology Results   FINAL MICROSCOPIC DIAGNOSIS:   A. CUL DE SAC, LEFT ANTERIOR, EXCISION:  -  Poorly differentiated carcinoma  -  See comment   B. SIDEWALL, RIGHT, BIOPSY:  -  Poorly differentiated carcinoma  -  See comment   COMMENT:   By immunohistochemistry, the neoplastic cells are positive for cytokeratin 7, p53, PAX8 and WT1 but negative for cytokeratin 20, GATA3, CDX2 and TTF-1.  Overall, the immunophenotype is consistent with a  gynecologic primary.    11/11/2020 Pathology Results   FINAL MICROSCOPIC DIAGNOSIS:  - Malignant cells consistent with  metastatic adenocarcinoma    11/11/2020 Surgery   Pre-operative Diagnosis: Carcinomatosis, mildly elevated CA-125   Post-operative Diagnosis: same, At least stage IIIC gyn malignancy   Operation: Diagnostic laparoscopy, peritoneal biopsies    Surgeon: Eugene Garnet MD Operative Findings: On EUA, small mobile uterus. On intra-abdominal entry, carcinomatosis appreciated studding the right anterior diaphragm, bilateral liver surfaces, mesentery, pelvic peritoneum. Omental cake with measuring up to 2x3cm. Right ovary adherent to the right uterus with peritoneal studding adjacent. Frondular implants noted along right pelvic sidewall. Miliary disease along anterior cul de sac. Cul de sac covered with friable miliary disease. Small volume dark amber ascites. Specimens: left anterior cul de sac peritoneum, right pelvic sidewall peritoneal biopsy          11/15/2020 Cancer Staging   Staging form: Ovary, Fallopian Tube, and Primary Peritoneal Carcinoma, AJCC 8th Edition - Clinical stage from 11/15/2020: FIGO Stage III (cT3, cN0, cM0) - Signed by Artis Delay, MD on 11/15/2020 Stage prefix: Initial diagnosis   11/26/2020 - 05/13/2021 Chemotherapy   Patient is on Treatment Plan : OVARIAN Carboplatin (AUC 6) / Paclitaxel (175) q21d x 6 cycles     11/26/2020 Tumor Marker   Patient's tumor was tested for the following markers: CA-125. Results of the tumor marker test revealed 76.8.   12/17/2020 Tumor Marker   Patient's tumor was tested for the following markers: CA-125. Results of the tumor marker test revealed 58.1.   01/07/2021 Tumor Marker   Patient's tumor  was tested for the following markers: CA-125. Results of the tumor marker test revealed 21.6.   01/20/2021 Imaging   Decreased omental soft tissue nodularity and caking, consistent with interval improvement in carcinoma.   No evidence of new or progressive disease within the abdomen or pelvis.   Colonic diverticulosis. No radiographic evidence of  diverticulitis.   Large stool burden noted; recommend clinical correlation for possible constipation   02/24/2021 Surgery   Robotic-assisted laparoscopic total hysterectomy with bilateral salpingoophorectomy, peritoneal stripping, mini-lap for omentectomy and intra-abdominal palpation  Findings: On EUA, small mobile uterus. Rectum free. On intra-abdominal entry, normal upper abdominal survey including liver edge, diaphragm and stomach. Omentum with several small (<2cm) areas of thickening, suspected treated tumor versus residual tumor. Uterus 6cm and normal in appearance. Atrophic appearing bilateral adnexa. Some adhesions of the left ovary to the medial leaf of the broad ligament. Minimal peritoneal nodularity versus treated disease within posterior cul de sac and deep left pelvis, all either excised or ablated. No significant peritoneal disease or carcinomatosis. No sigmoid or rectal involvement. Small bowel normal in appearance. No adenopathy. No ascites. R0 resection.    02/24/2021 Pathology Results   A. PERTIONEAL SCAR, EXCISION:  - Microscopic focus of high-grade serous carcinoma   B. 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 OMENTUM, RESECTION:  - Microscopic foci of high-grade serous carcinoma   03/14/2021 Tumor Marker   Patient's tumor was tested for the following markers: CA-125. Results of the tumor marker test revealed 32.7.   03/23/2021 Imaging   Unremarkable right upper quadrant ultrasound   04/01/2021 Genetic Testing   HRD status through [facility] [employer] was negative.  The report date is 04/01/2021. Negative hereditary cancer genetic testing: no pathogenic variants detected in [employer] [facility].  The report date is 04/04/2021.  The [facility] gene panel offered  by [employer] includes sequencing and deletion/duplication testing of the following 48 genes: APC, ATM, AXIN2, BAP1, BARD1, BMPR1A, BRCA1, BRCA2, BRIP1, CHD1, CDK4, CDKN2A(p16 and p14ARF), CHEK2, CTNNA1, EGFR, EPCAM, FH, FLCN, GREM1, HOXB13, MEN1, MET, MITF , MLH1, MSH2, MSH3, MSH6, MUTYH, NTHL1, PALB2, PMS2, POLD1, POLE, PTEN, RAD51C, RAD51D, RET, SDHA, SDHB, SDHC, SDHD, SMAD4, STK11,TERT, TP53, TSC1, TSC2, and VHL.   04/12/2021 Tumor Marker   Patient's tumor was tested for the following markers: CA-125. Results of the tumor marker test revealed 12.7.   06/13/2021 Imaging   1. No new mass or lymphadenopathy identified in the abdomen or pelvis. Interval resolution of previous omental soft tissue caking. 2. Other ancillary findings.   06/13/2021 Tumor Marker   Patient's tumor was tested for the following markers: CA-125. Results of the tumor marker test revealed 10.1.   07/11/2021 Tumor Marker   Patient's tumor was tested for the following markers: CA-125. Results of the tumor marker test revealed 9.4.   09/08/2021 Imaging   1. Status post hysterectomy and omentectomy. 2. No evidence of lymphadenopathy or metastatic disease in the abdomen or pelvis.     09/08/2021 Tumor Marker   Patient's tumor was tested for the following markers: CA-125. Results of the tumor marker test revealed 9.0.   02/22/2022 Imaging   IMPRESSION: 1. No acute findings.  No ascites, mass or adenopathy. 2.  Aortic Atherosclerosis (ICD10-170.0).   03/10/2022 Tumor Marker   Patient's  tumor was tested for the following markers: CA-125. Results of the tumor marker test revealed 11.7.   06/09/2022 Tumor Marker   Patient's tumor was tested for the following markers: CA-125. Results of the tumor marker test revealed 29.2.   09/07/2022 Tumor Marker   Patient's tumor was tested for the following markers: CA-125. Results of the tumor marker test revealed 39.7.   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 RADIATION DOSE REDUCTION: This exam was performed according to the departmental dose-optimization program which includes automated exposure control, adjustment of the mA and/or kV according to patient size and/or use of iterative reconstruction technique. CONTRAST:  60mL OMNIPAQUE IOHEXOL 350 MG/ML SOLN COMPARISON:  06/22/2022 and previous FINDINGS: Lower chest: No pleural or pericardial effusion. Visualized lung bases grossly clear, with some motion degradation. Hepatobiliary: No focal liver abnormality is seen. No gallstones, gallbladder wall thickening, or biliary dilatation. Pancreas: Unremarkable. No pancreatic ductal dilatation or surrounding inflammatory changes. Spleen: Normal in size without focal abnormality. Accessory splenule. Adrenals/Urinary Tract: No adrenal mass. Symmetric renal parenchymal enhancement without urolithiasis or hydronephrosis. Urinary bladder incompletely distended. Stomach/Bowel: Stomach incompletely distended, unremarkable. Small bowel decompressed. Post appendectomy. Gas and fecal material partially distend the colon, without acute finding. A few scattered diverticula in the sigmoid segment. Vascular/Lymphatic: Minimal calcified aortic plaque without aneurysm or stenosis. Portal vein patent. A few scattered mesenteric lymph nodes none greater than 4 mm short axis diameter. No abdominal or pelvic adenopathy. Reproductive: Status post hysterectomy. No adnexal masses. No peritoneal nodularity. Other: No ascites. No free air. Bilateral pelvic phleboliths stable. Musculoskeletal: Stable lower lumbar spondylitic change with early grade and grade 1 anterolisthesis L4-5 and degenerative disc  disease L5-S1. No fracture or worrisome bone lesion. IMPRESSION: 1. No acute findings. 2. No evidence of recurrent or metastatic disease. 3. Sigmoid diverticulosis. Electronically Signed   By: Corlis Leak M.D.   On: 09/15/2022 20:09      11/24/2022 Tumor Marker   Patient's tumor was tested for the following markers: CA-125. Results of the tumor marker test revealed 47.6.   12/04/2022 Imaging   CT A/P 1. A new 2.4 x 2.1 cm low attenuating structure in the right pelvis abutting the vaginal cuff concerning for recurrent disease, given rise in CA 125. Clinical correlation is recommended. 2. No bowel obstruction. Appendectomy. 3.  Aortic Atherosclerosis (ICD10-I70.0).   12/20/2022 Surgery   Robotic-assisted excision of pelvic mass requiring colpotomy with vaginal cuff closure [name]), excision of left para-colic gutter implant (White), low anterior resection with reanastomosis [name]), cystoscopy [name])   Findings: On EUA, 2-3 cm mass is palpable in the vaginal apex, and close proximity to the rectum but without direct involvement.  Intra-abdominal entry, normal-appearing diaphragm, liver edge, and stomach.  No peritoneal disease or implants noted.  Normal-appearing small bowel.  Surgically absent uterus, cervix, fallopian tubes and ovaries.  Small, 5 mm cystic nodule noted along the left paracolic gutter lateral to the sigmoid colon (excised).  Within the pelvis, there was a 3 cm cystic mass, apparently bilobed, adherent to the posterior aspect of the vaginal cuff in the midline into the right.  This was close to but not directly involving the rectum nor much of the rectal mesentery.  There was minimal spillage from the  cystic lesion during manipulation to excise the specimen.  Upon resection of this area, the more superior aspect of the rectum was identified as having a similar appearing 1-1.5 cm cystic mass involving the rectum and adjacent mesentery.  The entire lesion was removed within the segment of  rectosigmoid colon.  No additional implants or findings concerning for metastatic disease were noted in the abdomen or pelvis. R0 resection. On cystoscopy, bladder dome intact. Good efflux noted from bilateral ureteral orifices.   12/20/2022 Pathology Results   A. PELVIC MASS, EXCISION:  - High-grade serous carcinoma, involving vaginal mucosa and adjacent  soft tissue   B. LEFT PERICOLIC GUTTER NODULE, EXCISION:  - High-grade serous carcinoma  - Resection margin is negative for carcinoma   C. RECTOSIGMOID COLON, RESECTION:  - High-grade serous carcinoma involving segment of rectosigmoid colon  - Lymph node, negative for carcinoma (0/1)  - Resection margins, negative for carcinoma   D. FINAL DISTAL MARGIN OF ANASTOMOTIC RING, EXCISION:  - Colonic donut, negative for carcinoma   Cytology FINAL MICROSCOPIC DIAGNOSIS:  - Malignant cells present  - See comment     01/19/2023 -  Chemotherapy   Patient is on Treatment Plan : OVARIAN Carboplatin (AUC 6) + Paclitaxel (175) q21d X 6 Cycles       PHYSICAL EXAMINATION: ECOG PERFORMANCE STATUS: 0 - Asymptomatic  Vitals:   01/12/23 0850  BP: (!) 124/58  Pulse: 72  Resp: 18  Temp: (!) 97.4 F (36.3 C)  SpO2: 100%   Filed Weights   01/12/23 0850  Weight: 115 lb 6.4 oz (52.3 kg)    GENERAL:alert, no distress and comfortable SKIN: skin color, texture, turgor are normal, no rashes or significant lesions EYES: normal, Conjunctiva are pink and non-injected, sclera clear OROPHARYNX:no exudate, no erythema and lips, buccal mucosa, and tongue normal  NECK: supple, thyroid normal size, non-tender, without nodularity LYMPH:  no palpable lymphadenopathy in the cervical, axillary or inguinal LUNGS: clear to auscultation and percussion with normal breathing effort HEART: regular rate & rhythm and no murmurs and no lower extremity edema ABDOMEN:abdomen soft, non-tender and normal bowel sounds.  Noted well-healed surgical  scar Musculoskeletal:no cyanosis of digits and no clubbing  NEURO: alert & oriented x 3 with fluent speech, no focal motor/sensory deficits  LABORATORY DATA:  I have reviewed the data as listed    Component Value Date/Time   NA 131 (L) 12/22/2022 0443   K 4.1 12/22/2022 0443   CL 102 12/22/2022 0443   CO2 24 12/22/2022 0443   GLUCOSE 117 (H) 12/22/2022 0443   BUN 11 12/22/2022 0443   CREATININE 0.72 12/22/2022 0443   CREATININE 0.88 09/07/2021 0919   CALCIUM 8.0 (L) 12/22/2022 0443   PROT 6.9 11/22/2022 0807   ALBUMIN 4.5 11/22/2022 0807   AST 18 11/22/2022 0807   AST 20 09/07/2021 0919   ALT 12 11/22/2022 0807   ALT 15 09/07/2021 0919   ALKPHOS 34 (L) 11/22/2022 0807   BILITOT 0.6 11/22/2022 0807   BILITOT 0.7 09/07/2021 0919   GFRNONAA >60 12/22/2022 0443   GFRNONAA >60 09/07/2021 0919   GFRAA  02/27/2007 1510    >60        The eGFR has been calculated using the MDRD equation. This calculation has not been validated in all clinical    No results found for: "SPEP", "UPEP"  Lab Results  Component Value Date   WBC 7.1 12/22/2022   NEUTROABS 2.8 11/22/2022   HGB 11.2 (L) 12/22/2022  HCT 33.7 (L) 12/22/2022   MCV 91.6 12/22/2022   PLT 122 (L) 12/22/2022      Chemistry      Component Value Date/Time   NA 131 (L) 12/22/2022 0443   K 4.1 12/22/2022 0443   CL 102 12/22/2022 0443   CO2 24 12/22/2022 0443   BUN 11 12/22/2022 0443   CREATININE 0.72 12/22/2022 0443   CREATININE 0.88 09/07/2021 0919      Component Value Date/Time   CALCIUM 8.0 (L) 12/22/2022 0443   ALKPHOS 34 (L) 11/22/2022 0807   AST 18 11/22/2022 0807   AST 20 09/07/2021 0919   ALT 12 11/22/2022 0807   ALT 15 09/07/2021 0919   BILITOT 0.6 11/22/2022 0807   BILITOT 0.7 09/07/2021 0919

## 2023-01-12 NOTE — Assessment & Plan Note (Signed)
I have reviewed her surgical report and pathology report We discussed the role of adjuvant treatment Previously, she tolerated chemotherapy well without major side effects We reviewed the NCCN guidelines We discussed the role of chemotherapy. The intent is of curative intent.  We discussed some of the risks, benefits, side-effects of carboplatin & Taxol. Treatment is intravenous, every 3 weeks x 6 cycles  Some of the short term side-effects included, though not limited to, including weight loss, life threatening infections, risk of allergic reactions, need for transfusions of blood products, nausea, vomiting, change in bowel habits, loss of hair, admission to hospital for various reasons, and risks of death.   Long term side-effects are also discussed including risks of infertility, permanent damage to nerve function, hearing loss, chronic fatigue, kidney damage with possibility needing hemodialysis, and rare secondary malignancy including bone marrow disorders.  The patient is aware that the response rates discussed earlier is not guaranteed.  After a long discussion, patient made an informed decision to proceed with the prescribed plan of care.   Patient education material was dispensed. We discussed premedication with dexamethasone before chemotherapy. She has good venous access and declined port placement I plan to repeat imaging study after completion of adjuvant treatment

## 2023-01-12 NOTE — Progress Notes (Signed)
Pharmacist Chemotherapy Monitoring - Initial Assessment    Anticipated start date: 01/19/23   The following has been reviewed per standard work regarding the patient's treatment regimen: The patient's diagnosis, treatment plan and drug doses, and organ/hematologic function Lab orders and baseline tests specific to treatment regimen  The treatment plan start date, drug sequencing, and pre-medications Prior authorization status  Patient's documented medication list, including drug-drug interaction screen and prescriptions for anti-emetics and supportive care specific to the treatment regimen The drug concentrations, fluid compatibility, administration routes, and timing of the medications to be used The patient's access for treatment and lifetime cumulative dose history, if applicable  The patient's medication allergies and previous infusion related reactions, if applicable   Changes made to treatment plan:  N/A  Follow up needed:  N/A   Robin Sellers, RPH, 01/12/2023  8:07 AM

## 2023-01-16 ENCOUNTER — Other Ambulatory Visit: Payer: Medicare Other

## 2023-01-18 MED FILL — Dexamethasone Sodium Phosphate Inj 100 MG/10ML: INTRAMUSCULAR | Qty: 1 | Status: AC

## 2023-01-18 MED FILL — Fosaprepitant Dimeglumine For IV Infusion 150 MG (Base Eq): INTRAVENOUS | Qty: 5 | Status: AC

## 2023-01-18 NOTE — Progress Notes (Signed)
Gynecologic Oncology Return Clinic Visit  01/19/23  Reason for Visit: follow-up  Treatment History: Oncology History Overview Note  Neg genetics,  Molecular testing through [facility] in 2024:  HER2/neu 0, negative F0 LR 2+, 40%, negative PD-L1 CPS score 5% ER +75% HRD negative   Primary peritoneal carcinomatosis (HCC)  08/21/2020 Initial Diagnosis   The patient reports intermittent right upper quadrant pain that feel like she pulled a muscle beginning in March 2022.  In April, she woke up with sharp pain in her right upper quadrant.  This was her first episode of sharp pain.  She thought that her pain was related to her gallbladder and presented to the emergency department.  She was seen in ED on 4/30. RUQ ultrasound and x-ray were normal as were LFTs and lipase.    10/27/2020 Imaging   Findings highly suspicious for peritoneal carcinomatosis. No site of primary malignancy identified.   Colonic diverticulosis, without radiographic evidence of diverticulitis   11/02/2020 Initial Diagnosis   Primary peritoneal carcinomatosis (HCC)   11/02/2020 Tumor Marker   Patient's tumor was tested for the following markers: CA-125. Results of the tumor marker test revealed 88.   11/11/2020 Pathology Results   FINAL MICROSCOPIC DIAGNOSIS:   A. CUL DE SAC, LEFT ANTERIOR, EXCISION:  -  Poorly differentiated carcinoma  -  See comment   B. SIDEWALL, RIGHT, BIOPSY:  -  Poorly differentiated carcinoma  -  See comment   COMMENT:   By immunohistochemistry, the neoplastic cells are positive for cytokeratin 7, p53, PAX8 and WT1 but negative for cytokeratin 20, GATA3, CDX2 and TTF-1.  Overall, the immunophenotype is consistent with a  gynecologic primary.    11/11/2020 Pathology Results   FINAL MICROSCOPIC DIAGNOSIS:  - Malignant cells consistent with metastatic adenocarcinoma    11/11/2020 Surgery   Pre-operative Diagnosis: Carcinomatosis, mildly elevated CA-125   Post-operative Diagnosis: same, At  least stage IIIC gyn malignancy   Operation: Diagnostic laparoscopy, peritoneal biopsies    Surgeon: Eugene Garnet MD Operative Findings: On EUA, small mobile uterus. On intra-abdominal entry, carcinomatosis appreciated studding the right anterior diaphragm, bilateral liver surfaces, mesentery, pelvic peritoneum. Omental cake with measuring up to 2x3cm. Right ovary adherent to the right uterus with peritoneal studding adjacent. Frondular implants noted along right pelvic sidewall. Miliary disease along anterior cul de sac. Cul de sac covered with friable miliary disease. Small volume dark amber ascites. Specimens: left anterior cul de sac peritoneum, right pelvic sidewall peritoneal biopsy          11/15/2020 Cancer Staging   Staging form: Ovary, Fallopian Tube, and Primary Peritoneal Carcinoma, AJCC 8th Edition - Clinical stage from 11/15/2020: FIGO Stage III (cT3, cN0, cM0) - Signed by Artis Delay, MD on 11/15/2020 Stage prefix: Initial diagnosis   11/26/2020 - 05/13/2021 Chemotherapy   Patient is on Treatment Plan : OVARIAN Carboplatin (AUC 6) / Paclitaxel (175) q21d x 6 cycles     11/26/2020 Tumor Marker   Patient's tumor was tested for the following markers: CA-125. Results of the tumor marker test revealed 76.8.   12/17/2020 Tumor Marker   Patient's tumor was tested for the following markers: CA-125. Results of the tumor marker test revealed 58.1.   01/07/2021 Tumor Marker   Patient's tumor was tested for the following markers: CA-125. Results of the tumor marker test revealed 21.6.   01/20/2021 Imaging   Decreased omental soft tissue nodularity and caking, consistent with interval improvement in carcinoma.   No evidence of new or progressive disease within  the abdomen or pelvis.   Colonic diverticulosis. No radiographic evidence of diverticulitis.   Large stool burden noted; recommend clinical correlation for possible constipation   02/24/2021 Surgery   Robotic-assisted  laparoscopic total hysterectomy with bilateral salpingoophorectomy, peritoneal stripping, mini-lap for omentectomy and intra-abdominal palpation  Findings: On EUA, small mobile uterus. Rectum free. On intra-abdominal entry, normal upper abdominal survey including liver edge, diaphragm and stomach. Omentum with several small (<2cm) areas of thickening, suspected treated tumor versus residual tumor. Uterus 6cm and normal in appearance. Atrophic appearing bilateral adnexa. Some adhesions of the left ovary to the medial leaf of the broad ligament. Minimal peritoneal nodularity versus treated disease within posterior cul de sac and deep left pelvis, all either excised or ablated. No significant peritoneal disease or carcinomatosis. No sigmoid or rectal involvement. Small bowel normal in appearance. No adenopathy. No ascites. R0 resection.    02/24/2021 Pathology Results   A. PERTIONEAL SCAR, EXCISION:  - Microscopic focus of high-grade serous carcinoma   B. 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 OMENTUM, RESECTION:  - Microscopic foci of high-grade serous carcinoma   03/14/2021 Tumor Marker   Patient's tumor was tested for the following markers: CA-125. Results of the tumor marker test revealed 32.7.   03/23/2021 Imaging   Unremarkable right upper quadrant ultrasound   04/01/2021 Genetic Testing   HRD status through [facility] was negative.  The report date is 04/01/2021. Negative hereditary cancer genetic testing: no pathogenic variants detected in [facility].  The report date is 04/04/2021.  The [facility] gene panel offered by [employer] includes sequencing and deletion/duplication testing of the following 48 genes: APC, ATM, AXIN2, BAP1, BARD1,  BMPR1A, BRCA1, BRCA2, BRIP1, CHD1, CDK4, CDKN2A(p16 and p14ARF), CHEK2, CTNNA1, EGFR, EPCAM, FH, FLCN, GREM1, HOXB13, MEN1, MET, MITF , MLH1, MSH2, MSH3, MSH6, MUTYH, NTHL1, PALB2, PMS2, POLD1, POLE, PTEN, RAD51C, RAD51D, RET, SDHA, SDHB, SDHC, SDHD, SMAD4, STK11,TERT, TP53, TSC1, TSC2, and VHL.   04/12/2021 Tumor Marker   Patient's tumor was tested for the following markers: CA-125. Results of the tumor marker test revealed 12.7.   06/13/2021 Imaging   1. No new mass or lymphadenopathy identified in the abdomen or pelvis. Interval resolution of previous omental soft tissue caking. 2. Other ancillary findings.   06/13/2021 Tumor Marker   Patient's tumor was tested for the following markers: CA-125. Results of the tumor marker test revealed 10.1.   07/11/2021 Tumor Marker   Patient's tumor was tested for the following markers: CA-125. Results of the tumor marker test revealed 9.4.   09/08/2021 Imaging   1. Status post hysterectomy and omentectomy. 2. No evidence of lymphadenopathy or metastatic disease in the abdomen or pelvis.     09/08/2021 Tumor Marker   Patient's tumor was tested for the following markers: CA-125. Results of the tumor marker test revealed 9.0.   02/22/2022 Imaging   IMPRESSION: 1. No acute findings.  No ascites, mass or adenopathy. 2.  Aortic Atherosclerosis (ICD10-170.0).   03/10/2022 Tumor Marker   Patient's tumor was tested for the following markers: CA-125. Results of the tumor marker test revealed 11.7.   06/09/2022 Tumor Marker   Patient's tumor was tested for the following markers: CA-125. Results of the tumor marker test revealed 29.2.   09/07/2022 Tumor  Marker   Patient's tumor was tested for the following markers: CA-125. Results of the tumor marker test revealed 39.7.   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 RADIATION DOSE REDUCTION: This exam was performed according to the departmental dose-optimization program which includes automated exposure control, adjustment of the mA and/or kV according to patient size and/or use of iterative reconstruction technique. CONTRAST:  60mL OMNIPAQUE IOHEXOL 350 MG/ML SOLN COMPARISON:  06/22/2022 and previous FINDINGS: Lower chest: No pleural or pericardial effusion. Visualized lung bases grossly clear, with some motion degradation. Hepatobiliary: No focal liver abnormality is seen. No gallstones, gallbladder wall thickening, or biliary dilatation. Pancreas: Unremarkable. No pancreatic ductal dilatation or surrounding inflammatory changes. Spleen: Normal in size without focal abnormality. Accessory splenule. Adrenals/Urinary Tract: No adrenal mass. Symmetric renal parenchymal enhancement without urolithiasis or hydronephrosis. Urinary bladder incompletely distended. Stomach/Bowel: Stomach incompletely distended, unremarkable. Small bowel decompressed. Post appendectomy. Gas and fecal material partially distend the colon, without acute finding. A few scattered diverticula in the sigmoid segment. Vascular/Lymphatic: Minimal calcified aortic plaque without aneurysm or stenosis. Portal vein patent. A few scattered mesenteric lymph nodes none greater than 4 mm short axis diameter. No abdominal or pelvic adenopathy. Reproductive: Status post hysterectomy. No adnexal masses. No peritoneal nodularity. Other: No ascites. No free air. Bilateral pelvic phleboliths stable. Musculoskeletal: Stable lower lumbar spondylitic change with early grade and grade 1 anterolisthesis L4-5 and degenerative disc disease L5-S1. No fracture or worrisome bone lesion. IMPRESSION: 1. No acute findings. 2. No evidence of recurrent or metastatic disease. 3.  Sigmoid diverticulosis. Electronically Signed   By: Corlis Leak M.D.   On: 09/15/2022 20:09      11/24/2022 Tumor Marker   Patient's tumor was tested for the following markers: CA-125. Results of the tumor marker test revealed 47.6.   12/04/2022 Imaging   CT A/P 1. A new 2.4 x 2.1 cm low attenuating structure in the right pelvis abutting the vaginal cuff concerning for recurrent disease, given rise in CA 125. Clinical correlation is recommended. 2. No bowel obstruction. Appendectomy. 3.  Aortic Atherosclerosis (ICD10-I70.0).   12/20/2022 Surgery   Robotic-assisted excision of pelvic mass requiring colpotomy with vaginal cuff closure [name]), excision of left para-colic gutter implant (White), low anterior resection with reanastomosis [name]), cystoscopy [name])   Findings: On EUA, 2-3 cm mass is palpable in the vaginal apex, and close proximity to the rectum but without direct involvement.  Intra-abdominal entry, normal-appearing diaphragm, liver edge, and stomach.  No peritoneal disease or implants noted.  Normal-appearing small bowel.  Surgically absent uterus, cervix, fallopian tubes and ovaries.  Small, 5 mm cystic nodule noted along the left paracolic gutter lateral to the sigmoid colon (excised).  Within the pelvis, there was a 3 cm cystic mass, apparently bilobed, adherent to the posterior aspect of the vaginal cuff in the midline into the right.  This was close to but not directly involving the rectum nor much of the rectal mesentery.  There was minimal spillage from the cystic lesion during manipulation to excise the specimen.  Upon resection of this area, the more superior aspect of the rectum was identified as having a similar appearing 1-1.5 cm cystic mass involving the rectum and adjacent mesentery.  The entire lesion was removed  within the segment of rectosigmoid colon.  No additional implants or findings concerning for metastatic disease were noted in the abdomen or pelvis. R0  resection. On cystoscopy, bladder dome intact. Good efflux noted from bilateral ureteral orifices.   12/20/2022 Pathology Results   A. PELVIC MASS, EXCISION:  - High-grade serous carcinoma, involving vaginal mucosa and adjacent  soft tissue   B. LEFT PERICOLIC GUTTER NODULE, EXCISION:  - High-grade serous carcinoma  - Resection margin is negative for carcinoma   C. RECTOSIGMOID COLON, RESECTION:  - High-grade serous carcinoma involving segment of rectosigmoid colon  - Lymph node, negative for carcinoma (0/1)  - Resection margins, negative for carcinoma   D. FINAL DISTAL MARGIN OF ANASTOMOTIC RING, EXCISION:  - Colonic donut, negative for carcinoma   Cytology FINAL MICROSCOPIC DIAGNOSIS:  - Malignant cells present  - See comment     01/19/2023 -  Chemotherapy   Patient is on Treatment Plan : OVARIAN Carboplatin (AUC 6) + Paclitaxel (175) q21d X 6 Cycles       Interval History: Doing well.  Has some abdominal soreness, denies significant pain.  Denies urinary symptoms.  Had some dark brown vaginal spotting earlier this week, no recent bleeding.  Reports normal bowel function.  Starting chemotherapy today.  Past Medical/Surgical History: Past Medical History:  Diagnosis Date   Anemia    Cancer (HCC)    ovarian   Family history of breast cancer 03/14/2021   Hole, retinal    Right eye   Osteoporosis    Polyp of colon, unspecified part of colon, unspecified type    Benign    Past Surgical History:  Procedure Laterality Date   APPENDECTOMY  02/27/2007   lsc   BILATERAL SALPINGECTOMY Right 03/1978   right ectopic, tied other tube   BOWEL RESECTION N/A 12/20/2022   Procedure: RECTOSIGMOID RESECTION AND REANASTAMOSIS, MINI LAPAROTOMY;  Surgeon: [name], MD;  Location: [facility];  Service: Gynecology;  Laterality: N/A;   CATARACT EXTRACTION Right 10/31/2011   Dr. Dione Booze   COLONOSCOPY  03/2019   Dr. Levora Angel   COLONOSCOPY WITH PROPOFOL N/A 04/01/2018    Procedure: COLONOSCOPY WITH PROPOFOL;  Surgeon: Kathi Der, MD;  Location: [facility];  Service: Gastroenterology;  Laterality: N/A;   CYSTOSCOPY  12/20/2022   Procedure: CYSTOSCOPY;  Surgeon: [name], MD;  Location: [facility];  Service: Gynecology;;   DILATION AND CURETTAGE OF UTERUS     x3, 2 for miscarriages, one for polyp vs fibroid   HAND / FINGER LESION EXCISION Left    lt. index finger   LAPAROSCOPIC HYSTERECTOMY     LAPAROSCOPY N/A 11/11/2020   Procedure: LAPAROSCOPY DIAGNOSTIC WITH PERITONEAL BIOPSY;  Surgeon: [name], MD;  Location: [facility];  Service: Gynecology;  Laterality: N/A;   POLYPECTOMY  04/01/2018   Procedure: POLYPECTOMY;  Surgeon: Kathi Der, MD;  Location: [facility];  Service: Gastroenterology;;   RETINAL TEAR REPAIR CRYOTHERAPY Right 04/25/2011   Dr. Luciana Axe   ROBOT ASSISTED MYOMECTOMY N/A 12/20/2022   Procedure: DIAGNOSTIC LAPAROSCOPY, XI ROBOTIC ASSISTED PELVIC MASS EXCISION;  Surgeon: [name], MD;  Location: [facility];  Service: Gynecology;  Laterality: N/A;   SUBMUCOSAL INJECTION  04/01/2018   Procedure: SUBMUCOSAL INJECTION;  Surgeon: Kathi Der, MD;  Location: [facility];  Service: Gastroenterology;;   TUBAL LIGATION Left 03/1978    Family History  Problem Relation Age of Onset   Lung cancer Mother 46       smoking hx   Skin  cancer Father        dx after 71; non-melanoma; scalp   Lung cancer Maternal Aunt        dx after 50; smoking hx   Breast cancer Daughter 40       ER+   Skin cancer Cousin        face; surgery only   Ovarian cancer Neg Hx    Endometrial cancer Neg Hx    Prostate cancer Neg Hx    Pancreatic cancer Neg Hx    Colon cancer Neg Hx     Social History   Socioeconomic History   Marital status: Legally Separated    Spouse name: Not on file   Number of children: Not on file   Years of education: Not on file   Highest education level: Not on file  Occupational History   Not on  file  Tobacco Use   Smoking status: Never    Passive exposure: Past (18 years)   Smokeless tobacco: Never  Vaping Use   Vaping status: Never Used  Substance and Sexual Activity   Alcohol use: Not Currently   Drug use: Never   Sexual activity: Not Currently  Other Topics Concern   Not on file  Social History Narrative   Not on file   Social Determinants of Health   Financial Resource Strain: Not on file  Food Insecurity: No Food Insecurity (12/21/2022)   Hunger Vital Sign    Worried About Running Out of Food in the Last Year: Never true    Ran Out of Food in the Last Year: Never true  Transportation Needs: No Transportation Needs (12/21/2022)   PRAPARE - Administrator, Civil Service (Medical): No    Lack of Transportation (Non-Medical): No  Physical Activity: Not on file  Stress: Not on file  Social Connections: Not on file    Current Medications:  Current Outpatient Medications:    conjugated estrogens (PREMARIN) vaginal cream, Place 1 Applicatorful vaginally 3 (three) times a week., Disp: 42.5 g, Rfl: 1   dexamethasone (DECADRON) 4 MG tablet, Take 2 tabs at the night before and 2 tab the morning of chemotherapy, every 3 weeks, by mouth x 6 cycles, Disp: 24 tablet, Rfl: 6   ondansetron (ZOFRAN) 4 MG tablet, Take 1-2 tablets (4-8 mg total) by mouth every 8 (eight) hours as needed for nausea or vomiting., Disp: 10 tablet, Rfl: 0   prochlorperazine (COMPAZINE) 10 MG tablet, Take 1 tablet (10 mg total) by mouth every 6 (six) hours as needed for nausea or vomiting., Disp: 30 tablet, Rfl: 1   risedronate (ACTONEL) 150 MG tablet, Take 150 mg by mouth every 30 (thirty) days. with water on empty stomach, nothing by mouth or lie down for next 30 minutes., Disp: , Rfl:    tretinoin (RETIN-A) 0.025 % cream, Apply 1 application  topically at bedtime., Disp: , Rfl:   Review of Systems: Denies appetite changes, fevers, chills, fatigue, unexplained weight changes. Denies hearing  loss, neck lumps or masses, mouth sores, ringing in ears or voice changes. Denies cough or wheezing.  Denies shortness of breath. Denies chest pain or palpitations. Denies leg swelling. Denies abdominal distention, pain, blood in stools, constipation, diarrhea, nausea, vomiting, or early satiety. Denies pain with intercourse, dysuria, frequency, hematuria or incontinence. Denies hot flashes, pelvic pain, or vaginal discharge.   Denies joint pain, back pain or muscle pain/cramps. Denies itching, rash, or wounds. Denies dizziness, headaches, numbness or seizures. Denies swollen lymph nodes  or glands, denies easy bruising or bleeding. Denies anxiety, depression, confusion, or decreased concentration.  Physical Exam: BP (!) 116/51 (BP Location: Left Arm, Patient Position: Sitting)   Pulse 82   Temp 97.8 F (36.6 C)   Resp 16   Ht 5\' 4"  (1.626 m)   Wt 115 lb (52.2 kg)   SpO2 99%   BMI 19.74 kg/m  General: Alert, oriented, no acute distress. HEENT: Normocephalic, atraumatic, sclera anicteric. Chest: Unlabored breathing on room air. Abdomen: soft, nontender.  Normoactive bowel sounds.  No masses or hepatosplenomegaly appreciated.  Well-healed incisions. Extremities: Grossly normal range of motion.  Warm, well perfused.  No edema bilaterally. GU: Normal appearing external genitalia without erythema, excoriation, or lesions.  Speculum exam reveals pinpoint oozing along the anterior upper vagina related to atrophy.  This area was treated with silver nitrate.  Cuff itself is intact with suture visible.  Bimanual exam reveals intact.    Laboratory & Radiologic Studies: None new  Assessment & Plan: Robin Sellers is a 72 y.o. 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 R0 debulking. [facility]: ER+, HRD neg, PDL1 CPS 5, P53 abn. ERBB2 negative ([facility] 0). FOLR1 40% (negative). Recurrence noted in the pelvis, now status post secondary  debulking surgery with resection of 3 separate lesions, completely excised.  Patient is overall doing well.  Reviewed postoperative expectations and restrictions.  Given vaginal atrophy, discussed short course of vaginal estrogen to help with healing.  Patient was given a copy of her pathology report which we reviewed today together.  She is starting platinum based chemotherapy.  18 minutes of total time was spent for this patient encounter, including preparation, face-to-face counseling with the patient and coordination of care, and documentation of the encounter.  Eugene Garnet, MD  Division of Gynecologic Oncology  Department of Obstetrics and Gynecology  [facility]

## 2023-01-19 ENCOUNTER — Inpatient Hospital Stay: Payer: Medicare Other

## 2023-01-19 ENCOUNTER — Other Ambulatory Visit (HOSPITAL_COMMUNITY): Payer: Self-pay

## 2023-01-19 ENCOUNTER — Other Ambulatory Visit: Payer: Self-pay | Admitting: *Deleted

## 2023-01-19 ENCOUNTER — Encounter: Payer: Medicare Other | Admitting: Gynecologic Oncology

## 2023-01-19 ENCOUNTER — Other Ambulatory Visit: Payer: Self-pay

## 2023-01-19 ENCOUNTER — Encounter: Payer: Self-pay | Admitting: Gynecologic Oncology

## 2023-01-19 ENCOUNTER — Inpatient Hospital Stay (HOSPITAL_BASED_OUTPATIENT_CLINIC_OR_DEPARTMENT_OTHER): Payer: Medicare Other | Admitting: Gynecologic Oncology

## 2023-01-19 VITALS — BP 116/51 | HR 82 | Temp 97.8°F | Resp 16 | Ht 64.0 in | Wt 115.0 lb

## 2023-01-19 VITALS — BP 120/63 | HR 74 | Temp 97.8°F | Resp 16

## 2023-01-19 DIAGNOSIS — Z7189 Other specified counseling: Secondary | ICD-10-CM

## 2023-01-19 DIAGNOSIS — C482 Malignant neoplasm of peritoneum, unspecified: Secondary | ICD-10-CM

## 2023-01-19 DIAGNOSIS — Z5111 Encounter for antineoplastic chemotherapy: Secondary | ICD-10-CM | POA: Diagnosis not present

## 2023-01-19 DIAGNOSIS — N952 Postmenopausal atrophic vaginitis: Secondary | ICD-10-CM

## 2023-01-19 LAB — CMP (CANCER CENTER ONLY)
ALT: 14 U/L (ref 0–44)
AST: 16 U/L (ref 15–41)
Albumin: 4.5 g/dL (ref 3.5–5.0)
Alkaline Phosphatase: 59 U/L (ref 38–126)
Anion gap: 7 (ref 5–15)
BUN: 23 mg/dL (ref 8–23)
CO2: 27 mmol/L (ref 22–32)
Calcium: 9.5 mg/dL (ref 8.9–10.3)
Chloride: 103 mmol/L (ref 98–111)
Creatinine: 0.79 mg/dL (ref 0.44–1.00)
GFR, Estimated: >60 mL/min (ref >60)
Glucose, Bld: 212 mg/dL — ABNORMAL HIGH (ref 70–99)
Potassium: 4.1 mmol/L (ref 3.5–5.1)
Sodium: 137 mmol/L (ref 135–145)
Total Bilirubin: 0.5 mg/dL (ref 0.3–1.2)
Total Protein: 7.3 g/dL (ref 6.5–8.1)

## 2023-01-19 LAB — CBC WITH DIFFERENTIAL (CANCER CENTER ONLY)
Abs Immature Granulocytes: 0.03 10*3/uL (ref 0.00–0.07)
Basophils Absolute: 0 10*3/uL (ref 0.0–0.1)
Basophils Relative: 0 %
Eosinophils Absolute: 0 10*3/uL (ref 0.0–0.5)
Eosinophils Relative: 0 %
HCT: 39.9 % (ref 36.0–46.0)
Hemoglobin: 13.6 g/dL (ref 12.0–15.0)
Immature Granulocytes: 0 %
Lymphocytes Relative: 6 %
Lymphs Abs: 0.6 10*3/uL — ABNORMAL LOW (ref 0.7–4.0)
MCH: 30.2 pg (ref 26.0–34.0)
MCHC: 34.1 g/dL (ref 30.0–36.0)
MCV: 88.5 fL (ref 80.0–100.0)
Monocytes Absolute: 0.1 10*3/uL (ref 0.1–1.0)
Monocytes Relative: 1 %
Neutro Abs: 8.6 10*3/uL — ABNORMAL HIGH (ref 1.7–7.7)
Neutrophils Relative %: 93 %
Platelet Count: 162 10*3/uL (ref 150–400)
RBC: 4.51 MIL/uL (ref 3.87–5.11)
RDW: 12.1 % (ref 11.5–15.5)
WBC Count: 9.3 10*3/uL (ref 4.0–10.5)
nRBC: 0 % (ref 0.0–0.2)

## 2023-01-19 MED ORDER — SODIUM CHLORIDE 0.9 % IV SOLN
175.0000 mg/m2 | Freq: Once | INTRAVENOUS | Status: AC
  Administered 2023-01-19: 264 mg via INTRAVENOUS
  Filled 2023-01-19: qty 44

## 2023-01-19 MED ORDER — SODIUM CHLORIDE 0.9 % IV SOLN
400.0000 mg | Freq: Once | INTRAVENOUS | Status: AC
  Administered 2023-01-19: 400 mg via INTRAVENOUS
  Filled 2023-01-19: qty 40

## 2023-01-19 MED ORDER — CETIRIZINE HCL 10 MG/ML IV SOLN
10.0000 mg | Freq: Once | INTRAVENOUS | Status: AC
  Administered 2023-01-19: 10 mg via INTRAVENOUS
  Filled 2023-01-19: qty 1

## 2023-01-19 MED ORDER — SODIUM CHLORIDE 0.9 % IV SOLN: Freq: Once | INTRAVENOUS | Status: AC

## 2023-01-19 MED ORDER — SODIUM CHLORIDE 0.9 % IV SOLN
150.0000 mg | Freq: Once | INTRAVENOUS | Status: AC
  Administered 2023-01-19: 150 mg via INTRAVENOUS
  Filled 2023-01-19: qty 150

## 2023-01-19 MED ORDER — FAMOTIDINE IN NACL 20-0.9 MG/50ML-% IV SOLN
20.0000 mg | Freq: Once | INTRAVENOUS | Status: AC
  Administered 2023-01-19: 20 mg via INTRAVENOUS
  Filled 2023-01-19: qty 50

## 2023-01-19 MED ORDER — ESTROGENS CONJUGATED 0.625 MG/GM VA CREA
1.0000 | TOPICAL_CREAM | VAGINAL | 1 refills | Status: DC
Start: 2023-01-19 — End: 2023-06-19
  Filled 2023-01-19: qty 60, 70d supply, fill #0

## 2023-01-19 MED ORDER — SODIUM CHLORIDE 0.9 % IV SOLN
10.0000 mg | Freq: Once | INTRAVENOUS | Status: AC
  Administered 2023-01-19: 10 mg via INTRAVENOUS
  Filled 2023-01-19: qty 10

## 2023-01-19 MED ORDER — PALONOSETRON HCL INJECTION 0.25 MG/5ML
0.2500 mg | Freq: Once | INTRAVENOUS | Status: AC
  Administered 2023-01-19: 0.25 mg via INTRAVENOUS
  Filled 2023-01-19: qty 5

## 2023-01-19 NOTE — Patient Instructions (Signed)
[place] [facility]  Discharge Instructions: Thank you for choosing [facility] to provide your oncology and hematology care.   If you have a lab appointment with the Cancer Center, please go directly to the Cancer Center and check in at the registration area.   Wear comfortable clothing and clothing appropriate for easy access to any Portacath or PICC line.   We strive to give you quality time with your provider. You may need to reschedule your appointment if you arrive late (15 or more minutes).  Arriving late affects you and other patients whose appointments are after yours.  Also, if you miss three or more appointments without notifying the office, you may be dismissed from the clinic at the provider's discretion.      For prescription refill requests, have your pharmacy contact our office and allow 72 hours for refills to be completed.    Today you received the following chemotherapy and/or immunotherapy agents : Paclitaxel, Carboplatin      To help prevent nausea and vomiting after your treatment, we encourage you to take your nausea medication as directed.  BELOW ARE SYMPTOMS THAT SHOULD BE REPORTED IMMEDIATELY: *FEVER GREATER THAN 100.4 F (38 C) OR HIGHER *CHILLS OR SWEATING *NAUSEA AND VOMITING THAT IS NOT CONTROLLED WITH YOUR NAUSEA MEDICATION *UNUSUAL SHORTNESS OF BREATH *UNUSUAL BRUISING OR BLEEDING *URINARY PROBLEMS (pain or burning when urinating, or frequent urination) *BOWEL PROBLEMS (unusual diarrhea, constipation, pain near the anus) TENDERNESS IN MOUTH AND THROAT WITH OR WITHOUT PRESENCE OF ULCERS (sore throat, sores in mouth, or a toothache) UNUSUAL RASH, SWELLING OR PAIN  UNUSUAL VAGINAL DISCHARGE OR ITCHING   Items with * indicate a potential emergency and should be followed up as soon as possible or go to the Emergency Department if any problems should occur.  Please show the CHEMOTHERAPY ALERT CARD or IMMUNOTHERAPY  ALERT CARD at check-in to the Emergency Department and triage nurse.  Should you have questions after your visit or need to cancel or reschedule your appointment, please contact [place] [facility]  Dept: [phone]  and follow the prompts.  Office hours are 8:00 a.m. to 4:30 p.m. Monday - Friday. Please note that voicemails left after 4:00 p.m. may not be returned until the following business day.  We are closed weekends and major holidays. You have access to a nurse at all times for urgent questions. Please call the main number to the clinic Dept: [phone] and follow the prompts.   For any non-urgent questions, you may also contact your provider using MyChart. We now offer e-Visits for anyone 41 and older to request care online for non-urgent symptoms. For details visit [email or web address].   Also download the MyChart app! Go to the app store, search "MyChart", open the app, select [place], and log in with your MyChart username and password.  Paclitaxel Injection What is this medication? PACLITAXEL (PAK li TAX el) treats some types of cancer. It works by slowing down the growth of cancer cells. This medicine may be used for other purposes; ask your health care provider or pharmacist if you have questions. COMMON BRAND NAME(S): Onxol, Taxol What should I tell my care team before I take this medication? They need to know if you have any of these conditions: Heart disease Liver disease Low white blood cell levels An unusual or allergic reaction to paclitaxel, other medications, foods, dyes, or preservatives If you or your partner are pregnant or trying  to get pregnant Breast-feeding How should I use this medication? This medication is injected into a vein. It is given by your care team in a hospital or clinic setting. Talk to your care team about the use of this medication in children. While it may be given to children for selected conditions,  precautions do apply. Overdosage: If you think you have taken too much of this medicine contact a poison control center or emergency room at once. NOTE: This medicine is only for you. Do not share this medicine with others. What if I miss a dose? Keep appointments for follow-up doses. It is important not to miss your dose. Call your care team if you are unable to keep an appointment. What may interact with this medication? Do not take this medication with any of the following: Live virus vaccines Other medications may affect the way this medication works. Talk with your care team about all of the medications you take. They may suggest changes to your treatment plan to lower the risk of side effects and to make sure your medications work as intended. This list may not describe all possible interactions. Give your health care provider a list of all the medicines, herbs, non-prescription drugs, or dietary supplements you use. Also tell them if you smoke, drink alcohol, or use illegal drugs. Some items may interact with your medicine. What should I watch for while using this medication? Your condition will be monitored carefully while you are receiving this medication. You may need blood work while taking this medication. This medication may make you feel generally unwell. This is not uncommon as chemotherapy can affect healthy cells as well as cancer cells. Report any side effects. Continue your course of treatment even though you feel ill unless your care team tells you to stop. This medication can cause serious allergic reactions. To reduce the risk, your care team may give you other medications to take before receiving this one. Be sure to follow the directions from your care team. This medication may increase your risk of getting an infection. Call your care team for advice if you get a fever, chills, sore throat, or other symptoms of a cold or flu. Do not treat yourself. Try to avoid being around  people who are sick. This medication may increase your risk to bruise or bleed. Call your care team if you notice any unusual bleeding. Be careful brushing or flossing your teeth or using a toothpick because you may get an infection or bleed more easily. If you have any dental work done, tell your dentist you are receiving this medication. Talk to your care team if you may be pregnant. Serious birth defects can occur if you take this medication during pregnancy. Talk to your care team before breastfeeding. Changes to your treatment plan may be needed. What side effects may I notice from receiving this medication? Side effects that you should report to your care team as soon as possible: Allergic reactions--skin rash, itching, hives, swelling of the face, lips, tongue, or throat Heart rhythm changes--fast or irregular heartbeat, dizziness, feeling faint or lightheaded, chest pain, trouble breathing Increase in blood pressure Infection--fever, chills, cough, sore throat, wounds that don't heal, pain or trouble when passing urine, general feeling of discomfort or being unwell Low blood pressure--dizziness, feeling faint or lightheaded, blurry vision Low red blood cell level--unusual weakness or fatigue, dizziness, headache, trouble breathing Painful swelling, warmth, or redness of the skin, blisters or sores at the infusion site Pain, tingling, or  numbness in the hands or feet Slow heartbeat--dizziness, feeling faint or lightheaded, confusion, trouble breathing, unusual weakness or fatigue Unusual bruising or bleeding Side effects that usually do not require medical attention (report to your care team if they continue or are bothersome): Diarrhea Hair loss Joint pain Loss of appetite Muscle pain Nausea Vomiting This list may not describe all possible side effects. Call your doctor for medical advice about side effects. You may report side effects to FDA at 1-800-FDA-1088. Where should I keep  my medication? This medication is given in a hospital or clinic. It will not be stored at home. NOTE: This sheet is a summary. It may not cover all possible information. If you have questions about this medicine, talk to your doctor, pharmacist, or health care provider.  2024 Elsevier/Gold Standard (2021-08-30 00:00:00) Carboplatin Injection What is this medication? CARBOPLATIN (KAR boe pla tin) treats some types of cancer. It works by slowing down the growth of cancer cells. This medicine may be used for other purposes; ask your health care provider or pharmacist if you have questions. COMMON BRAND NAME(S): Paraplatin What should I tell my care team before I take this medication? They need to know if you have any of these conditions: Blood disorders Hearing problems Kidney disease Recent or ongoing radiation therapy An unusual or allergic reaction to carboplatin, cisplatin, other medications, foods, dyes, or preservatives Pregnant or trying to get pregnant Breast-feeding How should I use this medication? This medication is injected into a vein. It is given by your care team in a hospital or clinic setting. Talk to your care team about the use of this medication in children. Special care may be needed. Overdosage: If you think you have taken too much of this medicine contact a poison control center or emergency room at once. NOTE: This medicine is only for you. Do not share this medicine with others. What if I miss a dose? Keep appointments for follow-up doses. It is important not to miss your dose. Call your care team if you are unable to keep an appointment. What may interact with this medication? Medications for seizures Some antibiotics, such as amikacin, gentamicin, neomycin, streptomycin, tobramycin Vaccines This list may not describe all possible interactions. Give your health care provider a list of all the medicines, herbs, non-prescription drugs, or dietary supplements you  use. Also tell them if you smoke, drink alcohol, or use illegal drugs. Some items may interact with your medicine. What should I watch for while using this medication? Your condition will be monitored carefully while you are receiving this medication. You may need blood work while taking this medication. This medication may make you feel generally unwell. This is not uncommon, as chemotherapy can affect healthy cells as well as cancer cells. Report any side effects. Continue your course of treatment even though you feel ill unless your care team tells you to stop. In some cases, you may be given additional medications to help with side effects. Follow all directions for their use. This medication may increase your risk of getting an infection. Call your care team for advice if you get a fever, chills, sore throat, or other symptoms of a cold or flu. Do not treat yourself. Try to avoid being around people who are sick. Avoid taking medications that contain aspirin, acetaminophen, ibuprofen, naproxen, or ketoprofen unless instructed by your care team. These medications may hide a fever. Be careful brushing or flossing your teeth or using a toothpick because you may get an  infection or bleed more easily. If you have any dental work done, tell your dentist you are receiving this medication. Talk to your care team if you wish to become pregnant or think you might be pregnant. This medication can cause serious birth defects. Talk to your care team about effective forms of contraception. Do not breast-feed while taking this medication. What side effects may I notice from receiving this medication? Side effects that you should report to your care team as soon as possible: Allergic reactions--skin rash, itching, hives, swelling of the face, lips, tongue, or throat Infection--fever, chills, cough, sore throat, wounds that don't heal, pain or trouble when passing urine, general feeling of discomfort or being  unwell Low red blood cell level--unusual weakness or fatigue, dizziness, headache, trouble breathing Pain, tingling, or numbness in the hands or feet, muscle weakness, change in vision, confusion or trouble speaking, loss of balance or coordination, trouble walking, seizures Unusual bruising or bleeding Side effects that usually do not require medical attention (report to your care team if they continue or are bothersome): Hair loss Nausea Unusual weakness or fatigue Vomiting This list may not describe all possible side effects. Call your doctor for medical advice about side effects. You may report side effects to FDA at 1-800-FDA-1088. Where should I keep my medication? This medication is given in a hospital or clinic. It will not be stored at home. NOTE: This sheet is a summary. It may not cover all possible information. If you have questions about this medicine, talk to your doctor, pharmacist, or health care provider.  2024 Elsevier/Gold Standard (2021-08-02 00:00:00)

## 2023-01-19 NOTE — Patient Instructions (Addendum)
It was good to see you today.  You are healing well.  Please use the vaginal estrogen every night for 2 weeks and then 3 times a week at night for another 2 weeks.  I sent the prescription to [facility] pharmacy because I think this will be significantly cheaper than either of your other 2 pharmacies.  Please remember, no heavy lifting for 6 weeks and nothing in the vagina for at least 10 weeks.

## 2023-01-20 ENCOUNTER — Other Ambulatory Visit: Payer: Self-pay

## 2023-01-20 LAB — CA 125: Cancer Antigen (CA) 125: 11.2 U/mL (ref 0.0–38.1)

## 2023-02-07 MED FILL — Fosaprepitant Dimeglumine For IV Infusion 150 MG (Base Eq): INTRAVENOUS | Qty: 5 | Status: AC

## 2023-02-07 MED FILL — Dexamethasone Sodium Phosphate Inj 100 MG/10ML: INTRAMUSCULAR | Qty: 1 | Status: AC

## 2023-02-08 ENCOUNTER — Inpatient Hospital Stay: Payer: Medicare Other | Attending: Hematology and Oncology

## 2023-02-08 ENCOUNTER — Inpatient Hospital Stay: Payer: Medicare Other

## 2023-02-08 ENCOUNTER — Inpatient Hospital Stay: Payer: Medicare Other | Admitting: Hematology and Oncology

## 2023-02-08 ENCOUNTER — Encounter: Payer: Self-pay | Admitting: Hematology and Oncology

## 2023-02-08 VITALS — BP 129/72 | HR 79 | Resp 17

## 2023-02-08 DIAGNOSIS — C786 Secondary malignant neoplasm of retroperitoneum and peritoneum: Secondary | ICD-10-CM | POA: Diagnosis present

## 2023-02-08 DIAGNOSIS — Z5111 Encounter for antineoplastic chemotherapy: Secondary | ICD-10-CM | POA: Insufficient documentation

## 2023-02-08 DIAGNOSIS — N952 Postmenopausal atrophic vaginitis: Secondary | ICD-10-CM | POA: Diagnosis not present

## 2023-02-08 DIAGNOSIS — C563 Malignant neoplasm of bilateral ovaries: Secondary | ICD-10-CM | POA: Diagnosis present

## 2023-02-08 DIAGNOSIS — C482 Malignant neoplasm of peritoneum, unspecified: Secondary | ICD-10-CM

## 2023-02-08 LAB — CMP (CANCER CENTER ONLY)
ALT: 13 U/L (ref 0–44)
AST: 16 U/L (ref 15–41)
Albumin: 4.6 g/dL (ref 3.5–5.0)
Alkaline Phosphatase: 48 U/L (ref 38–126)
Anion gap: 8 (ref 5–15)
BUN: 22 mg/dL (ref 8–23)
CO2: 27 mmol/L (ref 22–32)
Calcium: 9.9 mg/dL (ref 8.9–10.3)
Chloride: 105 mmol/L (ref 98–111)
Creatinine: 0.78 mg/dL (ref 0.44–1.00)
GFR, Estimated: >60 mL/min (ref >60)
Glucose, Bld: 154 mg/dL — ABNORMAL HIGH (ref 70–99)
Potassium: 4.3 mmol/L (ref 3.5–5.1)
Sodium: 140 mmol/L (ref 135–145)
Total Bilirubin: 0.3 mg/dL (ref 0.3–1.2)
Total Protein: 7.5 g/dL (ref 6.5–8.1)

## 2023-02-08 LAB — CBC WITH DIFFERENTIAL (CANCER CENTER ONLY)
Abs Immature Granulocytes: 0.04 10*3/uL (ref 0.00–0.07)
Basophils Absolute: 0 10*3/uL (ref 0.0–0.1)
Basophils Relative: 0 %
Eosinophils Absolute: 0 10*3/uL (ref 0.0–0.5)
Eosinophils Relative: 0 %
HCT: 41.8 % (ref 36.0–46.0)
Hemoglobin: 14.3 g/dL (ref 12.0–15.0)
Immature Granulocytes: 0 %
Lymphocytes Relative: 9 %
Lymphs Abs: 0.9 10*3/uL (ref 0.7–4.0)
MCH: 30.3 pg (ref 26.0–34.0)
MCHC: 34.2 g/dL (ref 30.0–36.0)
MCV: 88.6 fL (ref 80.0–100.0)
Monocytes Absolute: 0.1 10*3/uL (ref 0.1–1.0)
Monocytes Relative: 1 %
Neutro Abs: 8.3 10*3/uL — ABNORMAL HIGH (ref 1.7–7.7)
Neutrophils Relative %: 90 %
Platelet Count: 223 10*3/uL (ref 150–400)
RBC: 4.72 MIL/uL (ref 3.87–5.11)
RDW: 12.4 % (ref 11.5–15.5)
WBC Count: 9.3 10*3/uL (ref 4.0–10.5)
nRBC: 0 % (ref 0.0–0.2)

## 2023-02-08 MED ORDER — SODIUM CHLORIDE 0.9 % IV SOLN
150.0000 mg | Freq: Once | INTRAVENOUS | Status: AC
  Administered 2023-02-08: 150 mg via INTRAVENOUS
  Filled 2023-02-08: qty 150

## 2023-02-08 MED ORDER — SODIUM CHLORIDE 0.9 % IV SOLN
10.0000 mg | Freq: Once | INTRAVENOUS | Status: AC
  Administered 2023-02-08: 10 mg via INTRAVENOUS
  Filled 2023-02-08: qty 10

## 2023-02-08 MED ORDER — SODIUM CHLORIDE 0.9 % IV SOLN: Freq: Once | INTRAVENOUS | Status: AC

## 2023-02-08 MED ORDER — CETIRIZINE HCL 10 MG/ML IV SOLN
10.0000 mg | Freq: Once | INTRAVENOUS | Status: AC
  Administered 2023-02-08: 10 mg via INTRAVENOUS
  Filled 2023-02-08: qty 1

## 2023-02-08 MED ORDER — PALONOSETRON HCL INJECTION 0.25 MG/5ML
0.2500 mg | Freq: Once | INTRAVENOUS | Status: AC
  Administered 2023-02-08: 0.25 mg via INTRAVENOUS
  Filled 2023-02-08: qty 5

## 2023-02-08 MED ORDER — SODIUM CHLORIDE 0.9 % IV SOLN
399.0000 mg | Freq: Once | INTRAVENOUS | Status: AC
  Administered 2023-02-08: 400 mg via INTRAVENOUS
  Filled 2023-02-08: qty 40

## 2023-02-08 MED ORDER — FAMOTIDINE IN NACL 20-0.9 MG/50ML-% IV SOLN
20.0000 mg | Freq: Once | INTRAVENOUS | Status: AC
  Administered 2023-02-08: 20 mg via INTRAVENOUS
  Filled 2023-02-08: qty 50

## 2023-02-08 MED ORDER — SODIUM CHLORIDE 0.9 % IV SOLN
175.0000 mg/m2 | Freq: Once | INTRAVENOUS | Status: AC
  Administered 2023-02-08: 264 mg via INTRAVENOUS
  Filled 2023-02-08: qty 44

## 2023-02-08 NOTE — Patient Instructions (Signed)
[place] [facility]  Discharge Instructions: Thank you for choosing [facility] to provide your oncology and hematology care.   If you have a lab appointment with the Cancer Center, please go directly to the Cancer Center and check in at the registration area.   Wear comfortable clothing and clothing appropriate for easy access to any Portacath or PICC line.   We strive to give you quality time with your provider. You may need to reschedule your appointment if you arrive late (15 or more minutes).  Arriving late affects you and other patients whose appointments are after yours.  Also, if you miss three or more appointments without notifying the office, you may be dismissed from the clinic at the provider's discretion.      For prescription refill requests, have your pharmacy contact our office and allow 72 hours for refills to be completed.    Today you received the following chemotherapy and/or immunotherapy agents: Paclitaxel and Carboplatin      To help prevent nausea and vomiting after your treatment, we encourage you to take your nausea medication as directed.  BELOW ARE SYMPTOMS THAT SHOULD BE REPORTED IMMEDIATELY: *FEVER GREATER THAN 100.4 F (38 C) OR HIGHER *CHILLS OR SWEATING *NAUSEA AND VOMITING THAT IS NOT CONTROLLED WITH YOUR NAUSEA MEDICATION *UNUSUAL SHORTNESS OF BREATH *UNUSUAL BRUISING OR BLEEDING *URINARY PROBLEMS (pain or burning when urinating, or frequent urination) *BOWEL PROBLEMS (unusual diarrhea, constipation, pain near the anus) TENDERNESS IN MOUTH AND THROAT WITH OR WITHOUT PRESENCE OF ULCERS (sore throat, sores in mouth, or a toothache) UNUSUAL RASH, SWELLING OR PAIN  UNUSUAL VAGINAL DISCHARGE OR ITCHING   Items with * indicate a potential emergency and should be followed up as soon as possible or go to the Emergency Department if any problems should occur.  Please show the CHEMOTHERAPY ALERT CARD or IMMUNOTHERAPY  ALERT CARD at check-in to the Emergency Department and triage nurse.  Should you have questions after your visit or need to cancel or reschedule your appointment, please contact [place] [facility]  Dept: [phone]  and follow the prompts.  Office hours are 8:00 a.m. to 4:30 p.m. Monday - Friday. Please note that voicemails left after 4:00 p.m. may not be returned until the following business day.  We are closed weekends and major holidays. You have access to a nurse at all times for urgent questions. Please call the main number to the clinic Dept: [phone] and follow the prompts.   For any non-urgent questions, you may also contact your provider using MyChart. We now offer e-Visits for anyone 84 and older to request care online for non-urgent symptoms. For details visit [email or web address].   Also download the MyChart app! Go to the app store, search "MyChart", open the app, select [place], and log in with your MyChart username and password.

## 2023-02-08 NOTE — Progress Notes (Signed)
[facility] OFFICE PROGRESS NOTE  Patient Care Team: Burton Apley, MD as PCP - General (Internal Medicine) Paulina Fusi Servando Snare, RN as Oncology Nurse Navigator (Oncology)  HISTORY OF PRESENTING ILLNESS: Discussed the use of AI scribe software for clinical note transcription with the patient, who gave verbal consent to proceed.  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 She experienced a foggy sensation on the third day, which she attributed to coming off steroids. However, she reported minimal knee pain and by the following Tuesday, she felt fine. She maintained a good appetite, adequate hydration, and regular physical activity, specifically walking. She denied experiencing any nausea or constipation, attributing her good digestive health to her diet and physical activity. She also denied any neuropathy.  On the third day of the treatment cycle, she reported feeling slightly dizzy and took one or two doses of an unspecified medication, not Zofran. She noted a specific aversion to sweet foods on this day.  The patient also mentioned the use of an estrogen cream, prescribed by Dr. [name], which she used every night for two weeks, followed by three times a week for the next two weeks. She reported no discomfort and is not sexually active. She was unsure if she could continue using the cream after the specified period and planned to check with Dr. [name].  The patient expressed gratitude for the care she received and shared a positive outlook, living in faith and not fear. She also mentioned her commitment to self-care throughout her life.         Assessment and Plan    Primary peritoneal cancer Tolerated first cycle of chemotherapy well with minimal side effects. No nausea, constipation, or neuropathy reported. Mild dizziness on day 3 of the cycle  managed with antiemetics. -Continue current chemotherapy regimen. -Next treatment scheduled for November 8th, 2024.  Future maintenance treatment  Tumor is 75% estrogen sensitive. Discussed potential future treatments to prevent recurrence including antiestrogen treatment, chemotherapy pill (niraparib), or Avastin infusion. Each option has potential side effects and patient will have time to consider these options. -Discuss and decide on future treatment plan at last chemotherapy session in January 2025. -CT scan in February 2025 to serve as new baseline.  Vaginal Atrophy Patient using prescribed estrogen cream to aid in healing and reduce dryness. No discomfort reported. -Continue current regimen as prescribed by Dr. [name].          No orders of the defined types were placed in this encounter.   All questions were answered. The patient knows to call the clinic with any problems, questions or concerns. The total time spent in the appointment was 20 minutes encounter with patients including review of chart and various tests results, discussions about plan of care and coordination of care plan   Artis Delay, MD 02/08/2023 10:58 AM  REVIEW OF SYSTEMS:  All other systems were reviewed with the patient and are negative.  I have reviewed the past medical history, past surgical history, social history and family history with the patient and they are unchanged from previous note.  ALLERGIES:  is allergic to artichoke [cynara scolymus (artichoke)].  MEDICATIONS:  Current Outpatient Medications  Medication Sig Dispense Refill   conjugated estrogens (PREMARIN) vaginal cream Place 1 applicatorful vaginally 3 (three) times a week. 42.5 g 1   dexamethasone (DECADRON) 4 MG tablet Take 2 tabs at the night before  and 2 tab the morning of chemotherapy, every 3 weeks, by mouth x 6 cycles 24 tablet 6   ondansetron (ZOFRAN) 4 MG tablet Take 1-2 tablets (4-8 mg total) by mouth every 8 (eight) hours as  needed for nausea or vomiting. 10 tablet 0   prochlorperazine (COMPAZINE) 10 MG tablet Take 1 tablet (10 mg total) by mouth every 6 (six) hours as needed for nausea or vomiting. 30 tablet 1   risedronate (ACTONEL) 150 MG tablet Take 150 mg by mouth every 30 (thirty) days. with water on empty stomach, nothing by mouth or lie down for next 30 minutes.     tretinoin (RETIN-A) 0.025 % cream Apply 1 application  topically at bedtime.     No current facility-administered medications for this visit.    SUMMARY OF ONCOLOGIC HISTORY: Oncology History Overview Note  Neg genetics,  Molecular testing through [facility] in 2024:  HER2/neu 0, negative F0 LR 2+, 40%, negative PD-L1 CPS score 5% ER +75% HRD negative   Primary peritoneal carcinomatosis (HCC)  08/21/2020 Initial Diagnosis   The patient reports intermittent right upper quadrant pain that feel like she pulled a muscle beginning in March 2022.  In April, she woke up with sharp pain in her right upper quadrant.  This was her first episode of sharp pain.  She thought that her pain was related to her gallbladder and presented to the emergency department.  She was seen in ED on 4/30. RUQ ultrasound and x-ray were normal as were LFTs and lipase.    10/27/2020 Imaging   Findings highly suspicious for peritoneal carcinomatosis. No site of primary malignancy identified.   Colonic diverticulosis, without radiographic evidence of diverticulitis   11/02/2020 Initial Diagnosis   Primary peritoneal carcinomatosis (HCC)   11/02/2020 Tumor Marker   Patient's tumor was tested for the following markers: CA-125. Results of the tumor marker test revealed 88.   11/11/2020 Pathology Results   FINAL MICROSCOPIC DIAGNOSIS:   A. CUL DE SAC, LEFT ANTERIOR, EXCISION:  -  Poorly differentiated carcinoma  -  See comment   B. SIDEWALL, RIGHT, BIOPSY:  -  Poorly differentiated carcinoma  -  See comment   COMMENT:   By immunohistochemistry, the neoplastic cells are  positive for cytokeratin 7, p53, PAX8 and WT1 but negative for cytokeratin 20, GATA3, CDX2 and TTF-1.  Overall, the immunophenotype is consistent with a  gynecologic primary.    11/11/2020 Pathology Results   FINAL MICROSCOPIC DIAGNOSIS:  - Malignant cells consistent with metastatic adenocarcinoma    11/11/2020 Surgery   Pre-operative Diagnosis: Carcinomatosis, mildly elevated CA-125   Post-operative Diagnosis: same, At least stage IIIC gyn malignancy   Operation: Diagnostic laparoscopy, peritoneal biopsies    Surgeon: Eugene Garnet MD Operative Findings: On EUA, small mobile uterus. On intra-abdominal entry, carcinomatosis appreciated studding the right anterior diaphragm, bilateral liver surfaces, mesentery, pelvic peritoneum. Omental cake with measuring up to 2x3cm. Right ovary adherent to the right uterus with peritoneal studding adjacent. Frondular implants noted along right pelvic sidewall. Miliary disease along anterior cul de sac. Cul de sac covered with friable miliary disease. Small volume dark amber ascites. Specimens: left anterior cul de sac peritoneum, right pelvic sidewall peritoneal biopsy          11/15/2020 Cancer Staging   Staging form: Ovary, Fallopian Tube, and Primary Peritoneal Carcinoma, AJCC 8th Edition - Clinical stage from 11/15/2020: FIGO Stage III (cT3, cN0, cM0) - Signed by Artis Delay, MD on 11/15/2020 Stage prefix: Initial diagnosis   11/26/2020 -  05/13/2021 Chemotherapy   Patient is on Treatment Plan : OVARIAN Carboplatin (AUC 6) / Paclitaxel (175) q21d x 6 cycles     11/26/2020 Tumor Marker   Patient's tumor was tested for the following markers: CA-125. Results of the tumor marker test revealed 76.8.   12/17/2020 Tumor Marker   Patient's tumor was tested for the following markers: CA-125. Results of the tumor marker test revealed 58.1.   01/07/2021 Tumor Marker   Patient's tumor was tested for the following markers: CA-125. Results of the tumor marker  test revealed 21.6.   01/20/2021 Imaging   Decreased omental soft tissue nodularity and caking, consistent with interval improvement in carcinoma.   No evidence of new or progressive disease within the abdomen or pelvis.   Colonic diverticulosis. No radiographic evidence of diverticulitis.   Large stool burden noted; recommend clinical correlation for possible constipation   02/24/2021 Surgery   Robotic-assisted laparoscopic total hysterectomy with bilateral salpingoophorectomy, peritoneal stripping, mini-lap for omentectomy and intra-abdominal palpation  Findings: On EUA, small mobile uterus. Rectum free. On intra-abdominal entry, normal upper abdominal survey including liver edge, diaphragm and stomach. Omentum with several small (<2cm) areas of thickening, suspected treated tumor versus residual tumor. Uterus 6cm and normal in appearance. Atrophic appearing bilateral adnexa. Some adhesions of the left ovary to the medial leaf of the broad ligament. Minimal peritoneal nodularity versus treated disease within posterior cul de sac and deep left pelvis, all either excised or ablated. No significant peritoneal disease or carcinomatosis. No sigmoid or rectal involvement. Small bowel normal in appearance. No adenopathy. No ascites. R0 resection.    02/24/2021 Pathology Results   A. PERTIONEAL SCAR, EXCISION:  - Microscopic focus of high-grade serous carcinoma   B. 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 OMENTUM, RESECTION:  - Microscopic foci of high-grade serous carcinoma   03/14/2021 Tumor Marker   Patient's tumor was tested for the following markers: CA-125. Results of the tumor marker test revealed 32.7.   03/23/2021 Imaging   Unremarkable right upper quadrant  ultrasound   04/01/2021 Genetic Testing   HRD status through [facility] was negative.  The report date is 04/01/2021. Negative hereditary cancer genetic testing: no pathogenic variants detected in [facility].  The report date is 04/04/2021.  The [facility] gene panel offered by [employer] includes sequencing and deletion/duplication testing of the following 48 genes: APC, ATM, AXIN2, BAP1, BARD1, BMPR1A, BRCA1, BRCA2, BRIP1, CHD1, CDK4, CDKN2A(p16 and p14ARF), CHEK2, CTNNA1, EGFR, EPCAM, FH, FLCN, GREM1, HOXB13, MEN1, MET, MITF , MLH1, MSH2, MSH3, MSH6, MUTYH, NTHL1, PALB2, PMS2, POLD1, POLE, PTEN, RAD51C, RAD51D, RET, SDHA, SDHB, SDHC, SDHD, SMAD4, STK11,TERT, TP53, TSC1, TSC2, and VHL.   04/12/2021 Tumor Marker   Patient's tumor was tested for the following markers: CA-125. Results of the tumor marker test revealed 12.7.   06/13/2021 Imaging   1. No new mass or lymphadenopathy identified in the abdomen or pelvis. Interval resolution of previous omental soft tissue caking. 2. Other ancillary findings.   06/13/2021 Tumor Marker   Patient's tumor was tested for the following markers: CA-125. Results of the tumor marker test revealed 10.1.   07/11/2021 Tumor Marker   Patient's tumor was tested for the following markers: CA-125. Results of the tumor marker test revealed 9.4.  09/08/2021 Imaging   1. Status post hysterectomy and omentectomy. 2. No evidence of lymphadenopathy or metastatic disease in the abdomen or pelvis.     09/08/2021 Tumor Marker   Patient's tumor was tested for the following markers: CA-125. Results of the tumor marker test revealed 9.0.   02/22/2022 Imaging   IMPRESSION: 1. No acute findings.  No ascites, mass or adenopathy. 2.  Aortic Atherosclerosis (ICD10-170.0).   03/10/2022 Tumor Marker   Patient's tumor was tested for the following markers: CA-125. Results of the tumor marker test revealed 11.7.   06/09/2022 Tumor Marker   Patient's tumor  was tested for the following markers: CA-125. Results of the tumor marker test revealed 29.2.   09/07/2022 Tumor Marker   Patient's tumor was tested for the following markers: CA-125. Results of the tumor marker test revealed 39.7.   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 RADIATION DOSE REDUCTION: This exam was performed according to the departmental dose-optimization program which includes automated exposure control, adjustment of the mA and/or kV according to patient size and/or use of iterative reconstruction technique. CONTRAST:  60mL OMNIPAQUE IOHEXOL 350 MG/ML SOLN COMPARISON:  06/22/2022 and previous FINDINGS: Lower chest: No pleural or pericardial effusion. Visualized lung bases grossly clear, with some motion degradation. Hepatobiliary: No focal liver abnormality is seen. No gallstones, gallbladder wall thickening, or biliary dilatation. Pancreas: Unremarkable. No pancreatic ductal dilatation or surrounding inflammatory changes. Spleen: Normal in size without focal abnormality. Accessory splenule. Adrenals/Urinary Tract: No adrenal mass. Symmetric renal parenchymal enhancement without urolithiasis or hydronephrosis. Urinary bladder incompletely distended. Stomach/Bowel: Stomach incompletely distended, unremarkable. Small bowel decompressed. Post appendectomy. Gas and fecal material partially distend the colon, without acute finding. A few scattered diverticula in the sigmoid segment. Vascular/Lymphatic: Minimal calcified aortic plaque without aneurysm or stenosis. Portal vein patent. A few scattered mesenteric lymph nodes none greater than 4 mm short axis diameter. No abdominal or pelvic adenopathy.  Reproductive: Status post hysterectomy. No adnexal masses. No peritoneal nodularity. Other: No ascites. No free air. Bilateral pelvic phleboliths stable. Musculoskeletal: Stable lower lumbar spondylitic change with early grade and grade 1 anterolisthesis L4-5 and degenerative disc disease L5-S1. No fracture or worrisome bone lesion. IMPRESSION: 1. No acute findings. 2. No evidence of recurrent or metastatic disease. 3. Sigmoid diverticulosis. Electronically Signed   By: Corlis Leak M.D.   On: 09/15/2022 20:09      11/24/2022 Tumor Marker   Patient's tumor was tested for the following markers: CA-125. Results of the tumor marker test revealed 47.6.   12/04/2022 Imaging   CT A/P 1. A new 2.4 x 2.1 cm low attenuating structure in the right pelvis abutting the vaginal cuff concerning for recurrent disease, given rise in CA 125. Clinical correlation is recommended. 2. No bowel obstruction. Appendectomy. 3.  Aortic Atherosclerosis (ICD10-I70.0).   12/20/2022 Surgery   Robotic-assisted excision of pelvic mass requiring colpotomy with vaginal cuff closure [name]), excision of left para-colic gutter implant (White), low anterior resection with reanastomosis [name]), cystoscopy [name])   Findings: On EUA, 2-3 cm mass is palpable in the vaginal apex, and close proximity to the rectum but without direct involvement.  Intra-abdominal entry, normal-appearing diaphragm, liver edge, and stomach.  No peritoneal disease or implants noted.  Normal-appearing small bowel.  Surgically absent uterus, cervix, fallopian tubes and ovaries.  Small, 5 mm cystic nodule noted along the left paracolic gutter lateral to the sigmoid colon (excised).  Within the pelvis, there was a 3 cm cystic mass, apparently bilobed, adherent to the posterior aspect of the vaginal cuff in the midline into the right.  This was close to but not directly involving the rectum nor much of the rectal mesentery.  There was minimal spillage from the cystic  lesion during manipulation to excise the specimen.  Upon resection of this area, the more superior aspect of the rectum was identified as having a similar appearing 1-1.5 cm cystic mass involving the rectum and adjacent mesentery.  The entire lesion was removed within the segment of rectosigmoid colon.  No additional implants or findings concerning for metastatic disease were noted in the abdomen or pelvis. R0 resection. On cystoscopy, bladder dome intact. Good efflux noted from bilateral ureteral orifices.   12/20/2022 Pathology Results   A. PELVIC MASS, EXCISION:  - High-grade serous carcinoma, involving vaginal mucosa and adjacent  soft tissue   B. LEFT PERICOLIC GUTTER NODULE, EXCISION:  - High-grade serous carcinoma  - Resection margin is negative for carcinoma   C. RECTOSIGMOID COLON, RESECTION:  - High-grade serous carcinoma involving segment of rectosigmoid colon  - Lymph node, negative for carcinoma (0/1)  - Resection margins, negative for carcinoma   D. FINAL DISTAL MARGIN OF ANASTOMOTIC RING, EXCISION:  - Colonic donut, negative for carcinoma   Cytology FINAL MICROSCOPIC DIAGNOSIS:  - Malignant cells present  - See comment     01/19/2023 -  Chemotherapy   Patient is on Treatment Plan : OVARIAN Carboplatin (AUC 6) + Paclitaxel (175) q21d X 6 Cycles     01/19/2023 Tumor Marker   Patient's tumor was tested for the following markers: CA-125. Results of the tumor marker test revealed 11.2.     PHYSICAL EXAMINATION: ECOG PERFORMANCE STATUS: 1 - Symptomatic but completely ambulatory  Vitals:   02/08/23 1032  BP: 130/79  Pulse: 86  Resp: 18  Temp: (!) 97.4 F (36.3 C)  SpO2: 98%   Filed Weights   02/08/23 1032  Weight: 115 lb 3.2 oz (52.3 kg)    GENERAL:alert, no distress and comfortable  LABORATORY DATA:  I have reviewed the data as listed    Component Value Date/Time   NA 140 02/08/2023 1008   K 4.3 02/08/2023 1008   CL 105 02/08/2023 1008   CO2 27  02/08/2023 1008   GLUCOSE 154 (H) 02/08/2023 1008   BUN 22 02/08/2023 1008   CREATININE 0.78 02/08/2023 1008   CALCIUM 9.9 02/08/2023 1008   PROT 7.5 02/08/2023 1008   ALBUMIN 4.6 02/08/2023 1008   AST 16 02/08/2023 1008   ALT 13 02/08/2023 1008   ALKPHOS 48 02/08/2023 1008   BILITOT 0.3 02/08/2023 1008   GFRNONAA >60 02/08/2023 1008   GFRAA  02/27/2007 1510    >60        The eGFR has been calculated using the MDRD equation. This calculation has not been validated in all clinical    No results found for: "SPEP", "UPEP"  Lab Results  Component Value Date   WBC 9.3 02/08/2023   NEUTROABS 8.3 (H) 02/08/2023   HGB 14.3 02/08/2023   HCT 41.8 02/08/2023   MCV 88.6 02/08/2023   PLT 223 02/08/2023      Chemistry      Component Value Date/Time   NA 140 02/08/2023 1008   K 4.3  02/08/2023 1008   CL 105 02/08/2023 1008   CO2 27 02/08/2023 1008   BUN 22 02/08/2023 1008   CREATININE 0.78 02/08/2023 1008      Component Value Date/Time   CALCIUM 9.9 02/08/2023 1008   ALKPHOS 48 02/08/2023 1008   AST 16 02/08/2023 1008   ALT 13 02/08/2023 1008   BILITOT 0.3 02/08/2023 1008

## 2023-02-09 LAB — CA 125: Cancer Antigen (CA) 125: 9.9 U/mL (ref 0.0–38.1)

## 2023-02-20 ENCOUNTER — Other Ambulatory Visit: Payer: Self-pay | Admitting: Hematology and Oncology

## 2023-02-20 ENCOUNTER — Telehealth: Payer: Self-pay | Admitting: Hematology and Oncology

## 2023-02-20 ENCOUNTER — Encounter: Payer: Self-pay | Admitting: Hematology and Oncology

## 2023-02-20 NOTE — Telephone Encounter (Signed)
Patient is aware of scheduled appointment dates being moved form 03/23/2023 to now 03/21/2023; patient has stated they will call back if any conflicts occur

## 2023-03-01 MED FILL — Fosaprepitant Dimeglumine For IV Infusion 150 MG (Base Eq): INTRAVENOUS | Qty: 5 | Status: AC

## 2023-03-02 ENCOUNTER — Inpatient Hospital Stay: Payer: Medicare Other

## 2023-03-02 ENCOUNTER — Inpatient Hospital Stay (HOSPITAL_BASED_OUTPATIENT_CLINIC_OR_DEPARTMENT_OTHER): Payer: Medicare Other | Admitting: Hematology and Oncology

## 2023-03-02 ENCOUNTER — Inpatient Hospital Stay: Payer: Medicare Other | Attending: Hematology and Oncology

## 2023-03-02 ENCOUNTER — Ambulatory Visit: Payer: Medicare Other | Admitting: Gynecologic Oncology

## 2023-03-02 ENCOUNTER — Encounter: Payer: Self-pay | Admitting: Hematology and Oncology

## 2023-03-02 VITALS — BP 143/71 | HR 82 | Temp 97.4°F | Resp 18 | Ht 64.0 in | Wt 117.8 lb

## 2023-03-02 DIAGNOSIS — C482 Malignant neoplasm of peritoneum, unspecified: Secondary | ICD-10-CM | POA: Diagnosis not present

## 2023-03-02 DIAGNOSIS — Z5111 Encounter for antineoplastic chemotherapy: Secondary | ICD-10-CM | POA: Diagnosis present

## 2023-03-02 DIAGNOSIS — Z90722 Acquired absence of ovaries, bilateral: Secondary | ICD-10-CM | POA: Diagnosis not present

## 2023-03-02 DIAGNOSIS — Z90710 Acquired absence of both cervix and uterus: Secondary | ICD-10-CM | POA: Diagnosis not present

## 2023-03-02 DIAGNOSIS — C785 Secondary malignant neoplasm of large intestine and rectum: Secondary | ICD-10-CM | POA: Diagnosis not present

## 2023-03-02 DIAGNOSIS — C7989 Secondary malignant neoplasm of other specified sites: Secondary | ICD-10-CM | POA: Diagnosis not present

## 2023-03-02 LAB — CBC WITH DIFFERENTIAL (CANCER CENTER ONLY)
Abs Immature Granulocytes: 0.08 10*3/uL — ABNORMAL HIGH (ref 0.00–0.07)
Basophils Absolute: 0 10*3/uL (ref 0.0–0.1)
Basophils Relative: 0 %
Eosinophils Absolute: 0 10*3/uL (ref 0.0–0.5)
Eosinophils Relative: 0 %
HCT: 40.5 % (ref 36.0–46.0)
Hemoglobin: 13.9 g/dL (ref 12.0–15.0)
Immature Granulocytes: 1 %
Lymphocytes Relative: 11 %
Lymphs Abs: 0.8 10*3/uL (ref 0.7–4.0)
MCH: 29.8 pg (ref 26.0–34.0)
MCHC: 34.3 g/dL (ref 30.0–36.0)
MCV: 86.9 fL (ref 80.0–100.0)
Monocytes Absolute: 0.1 10*3/uL (ref 0.1–1.0)
Monocytes Relative: 1 %
Neutro Abs: 5.8 10*3/uL (ref 1.7–7.7)
Neutrophils Relative %: 87 %
Platelet Count: 239 10*3/uL (ref 150–400)
RBC: 4.66 MIL/uL (ref 3.87–5.11)
RDW: 12.1 % (ref 11.5–15.5)
WBC Count: 6.7 10*3/uL (ref 4.0–10.5)
nRBC: 0 % (ref 0.0–0.2)

## 2023-03-02 LAB — CMP (CANCER CENTER ONLY)
ALT: 14 U/L (ref 0–44)
AST: 16 U/L (ref 15–41)
Albumin: 4.5 g/dL (ref 3.5–5.0)
Alkaline Phosphatase: 50 U/L (ref 38–126)
Anion gap: 9 (ref 5–15)
BUN: 29 mg/dL — ABNORMAL HIGH (ref 8–23)
CO2: 26 mmol/L (ref 22–32)
Calcium: 9.9 mg/dL (ref 8.9–10.3)
Chloride: 103 mmol/L (ref 98–111)
Creatinine: 0.72 mg/dL (ref 0.44–1.00)
GFR, Estimated: >60 mL/min
Glucose, Bld: 153 mg/dL — ABNORMAL HIGH (ref 70–99)
Potassium: 4.2 mmol/L (ref 3.5–5.1)
Sodium: 138 mmol/L (ref 135–145)
Total Bilirubin: 0.3 mg/dL
Total Protein: 7.4 g/dL (ref 6.5–8.1)

## 2023-03-02 MED ORDER — CETIRIZINE HCL 10 MG/ML IV SOLN
10.0000 mg | Freq: Once | INTRAVENOUS | Status: AC
  Administered 2023-03-02: 10 mg via INTRAVENOUS
  Filled 2023-03-02: qty 1

## 2023-03-02 MED ORDER — SODIUM CHLORIDE 0.9 % IV SOLN: Freq: Once | INTRAVENOUS | Status: AC

## 2023-03-02 MED ORDER — PALONOSETRON HCL INJECTION 0.25 MG/5ML
0.2500 mg | Freq: Once | INTRAVENOUS | Status: AC
  Administered 2023-03-02: 0.25 mg via INTRAVENOUS
  Filled 2023-03-02: qty 5

## 2023-03-02 MED ORDER — SODIUM CHLORIDE 0.9 % IV SOLN
175.0000 mg/m2 | Freq: Once | INTRAVENOUS | Status: AC
  Administered 2023-03-02: 264 mg via INTRAVENOUS
  Filled 2023-03-02: qty 44

## 2023-03-02 MED ORDER — FAMOTIDINE IN NACL 20-0.9 MG/50ML-% IV SOLN
20.0000 mg | Freq: Once | INTRAVENOUS | Status: AC
  Administered 2023-03-02: 20 mg via INTRAVENOUS
  Filled 2023-03-02: qty 50

## 2023-03-02 MED ORDER — CARBOPLATIN CHEMO INJECTION 600 MG/60ML
399.0000 mg | Freq: Once | INTRAVENOUS | Status: AC
  Administered 2023-03-02: 400 mg via INTRAVENOUS
  Filled 2023-03-02: qty 40

## 2023-03-02 MED ORDER — DEXAMETHASONE SODIUM PHOSPHATE 10 MG/ML IJ SOLN
10.0000 mg | Freq: Once | INTRAMUSCULAR | Status: AC
  Administered 2023-03-02: 10 mg via INTRAVENOUS
  Filled 2023-03-02: qty 1

## 2023-03-02 MED ORDER — SODIUM CHLORIDE 0.9 % IV SOLN
150.0000 mg | Freq: Once | INTRAVENOUS | Status: AC
  Administered 2023-03-02: 150 mg via INTRAVENOUS
  Filled 2023-03-02: qty 150

## 2023-03-02 NOTE — Assessment & Plan Note (Signed)
Overall, she tolerated treatment well We discussed future maintenance therapy and risk and benefits of each option She will complete her chemotherapy in January to be followed by CT imaging in February

## 2023-03-02 NOTE — Patient Instructions (Signed)
[facility] - A DEPT OF [facility]  Discharge Instructions: Thank you for choosing [facility] to provide your oncology and hematology care.   If you have a lab appointment with the Cancer Center, please go directly to the Cancer Center and check in at the registration area.   Wear comfortable clothing and clothing appropriate for easy access to any Portacath or PICC line.   We strive to give you quality time with your provider. You may need to reschedule your appointment if you arrive late (15 or more minutes).  Arriving late affects you and other patients whose appointments are after yours.  Also, if you miss three or more appointments without notifying the office, you may be dismissed from the clinic at the provider's discretion.      For prescription refill requests, have your pharmacy contact our office and allow 72 hours for refills to be completed.    Today you received the following chemotherapy and/or immunotherapy agents: Paclitaxel, Carboplatin      To help prevent nausea and vomiting after your treatment, we encourage you to take your nausea medication as directed.  BELOW ARE SYMPTOMS THAT SHOULD BE REPORTED IMMEDIATELY: *FEVER GREATER THAN 100.4 F (38 C) OR HIGHER *CHILLS OR SWEATING *NAUSEA AND VOMITING THAT IS NOT CONTROLLED WITH YOUR NAUSEA MEDICATION *UNUSUAL SHORTNESS OF BREATH *UNUSUAL BRUISING OR BLEEDING *URINARY PROBLEMS (pain or burning when urinating, or frequent urination) *BOWEL PROBLEMS (unusual diarrhea, constipation, pain near the anus) TENDERNESS IN MOUTH AND THROAT WITH OR WITHOUT PRESENCE OF ULCERS (sore throat, sores in mouth, or a toothache) UNUSUAL RASH, SWELLING OR PAIN  UNUSUAL VAGINAL DISCHARGE OR ITCHING   Items with * indicate a potential emergency and should be followed up as soon as possible or go to the Emergency Department if any problems should occur.  Please show the CHEMOTHERAPY ALERT CARD or  IMMUNOTHERAPY ALERT CARD at check-in to the Emergency Department and triage nurse.  Should you have questions after your visit or need to cancel or reschedule your appointment, please contact [facility] - A DEPT OF [facility]  Dept: [phone]  and follow the prompts.  Office hours are 8:00 a.m. to 4:30 p.m. Monday - Friday. Please note that voicemails left after 4:00 p.m. may not be returned until the following business day.  We are closed weekends and major holidays. You have access to a nurse at all times for urgent questions. Please call the main number to the clinic Dept: [phone] and follow the prompts.   For any non-urgent questions, you may also contact your provider using MyChart. We now offer e-Visits for anyone 35 and older to request care online for non-urgent symptoms. For details visit [email or web address].   Also download the MyChart app! Go to the app store, search "MyChart", open the app, select [facility], and log in with your MyChart username and password.  Paclitaxel Injection What is this medication? PACLITAXEL (PAK li TAX el) treats some types of cancer. It works by slowing down the growth of cancer cells. This medicine may be used for other purposes; ask your health care provider or pharmacist if you have questions. COMMON BRAND NAME(S): Onxol, Taxol What should I tell my care team before I take this medication? They need to know if you have any of these conditions: Heart disease Liver disease Low white blood cell levels An unusual or allergic reaction to paclitaxel, other medications, foods, dyes, or preservatives  If you or your partner are pregnant or trying to get pregnant Breast-feeding How should I use this medication? This medication is injected into a vein. It is given by your care team in a hospital or clinic setting. Talk to your care team about the use of this medication in children. While it may be given to  children for selected conditions, precautions do apply. Overdosage: If you think you have taken too much of this medicine contact a poison control center or emergency room at once. NOTE: This medicine is only for you. Do not share this medicine with others. What if I miss a dose? Keep appointments for follow-up doses. It is important not to miss your dose. Call your care team if you are unable to keep an appointment. What may interact with this medication? Do not take this medication with any of the following: Live virus vaccines Other medications may affect the way this medication works. Talk with your care team about all of the medications you take. They may suggest changes to your treatment plan to lower the risk of side effects and to make sure your medications work as intended. This list may not describe all possible interactions. Give your health care provider a list of all the medicines, herbs, non-prescription drugs, or dietary supplements you use. Also tell them if you smoke, drink alcohol, or use illegal drugs. Some items may interact with your medicine. What should I watch for while using this medication? Your condition will be monitored carefully while you are receiving this medication. You may need blood work while taking this medication. This medication may make you feel generally unwell. This is not uncommon as chemotherapy can affect healthy cells as well as cancer cells. Report any side effects. Continue your course of treatment even though you feel ill unless your care team tells you to stop. This medication can cause serious allergic reactions. To reduce the risk, your care team may give you other medications to take before receiving this one. Be sure to follow the directions from your care team. This medication may increase your risk of getting an infection. Call your care team for advice if you get a fever, chills, sore throat, or other symptoms of a cold or flu. Do not treat  yourself. Try to avoid being around people who are sick. This medication may increase your risk to bruise or bleed. Call your care team if you notice any unusual bleeding. Be careful brushing or flossing your teeth or using a toothpick because you may get an infection or bleed more easily. If you have any dental work done, tell your dentist you are receiving this medication. Talk to your care team if you may be pregnant. Serious birth defects can occur if you take this medication during pregnancy. Talk to your care team before breastfeeding. Changes to your treatment plan may be needed. What side effects may I notice from receiving this medication? Side effects that you should report to your care team as soon as possible: Allergic reactions--skin rash, itching, hives, swelling of the face, lips, tongue, or throat Heart rhythm changes--fast or irregular heartbeat, dizziness, feeling faint or lightheaded, chest pain, trouble breathing Increase in blood pressure Infection--fever, chills, cough, sore throat, wounds that don't heal, pain or trouble when passing urine, general feeling of discomfort or being unwell Low blood pressure--dizziness, feeling faint or lightheaded, blurry vision Low red blood cell level--unusual weakness or fatigue, dizziness, headache, trouble breathing Painful swelling, warmth, or redness of the skin, blisters  or sores at the infusion site Pain, tingling, or numbness in the hands or feet Slow heartbeat--dizziness, feeling faint or lightheaded, confusion, trouble breathing, unusual weakness or fatigue Unusual bruising or bleeding Side effects that usually do not require medical attention (report to your care team if they continue or are bothersome): Diarrhea Hair loss Joint pain Loss of appetite Muscle pain Nausea Vomiting This list may not describe all possible side effects. Call your doctor for medical advice about side effects. You may report side effects to FDA at  1-800-FDA-1088. Where should I keep my medication? This medication is given in a hospital or clinic. It will not be stored at home. NOTE: This sheet is a summary. It may not cover all possible information. If you have questions about this medicine, talk to your doctor, pharmacist, or health care provider.  2024 Elsevier/Gold Standard (2021-08-30 00:00:00)  Carboplatin Injection What is this medication? CARBOPLATIN (KAR boe pla tin) treats some types of cancer. It works by slowing down the growth of cancer cells. This medicine may be used for other purposes; ask your health care provider or pharmacist if you have questions. COMMON BRAND NAME(S): Paraplatin What should I tell my care team before I take this medication? They need to know if you have any of these conditions: Blood disorders Hearing problems Kidney disease Recent or ongoing radiation therapy An unusual or allergic reaction to carboplatin, cisplatin, other medications, foods, dyes, or preservatives Pregnant or trying to get pregnant Breast-feeding How should I use this medication? This medication is injected into a vein. It is given by your care team in a hospital or clinic setting. Talk to your care team about the use of this medication in children. Special care may be needed. Overdosage: If you think you have taken too much of this medicine contact a poison control center or emergency room at once. NOTE: This medicine is only for you. Do not share this medicine with others. What if I miss a dose? Keep appointments for follow-up doses. It is important not to miss your dose. Call your care team if you are unable to keep an appointment. What may interact with this medication? Medications for seizures Some antibiotics, such as amikacin, gentamicin, neomycin, streptomycin, tobramycin Vaccines This list may not describe all possible interactions. Give your health care provider a list of all the medicines, herbs,  non-prescription drugs, or dietary supplements you use. Also tell them if you smoke, drink alcohol, or use illegal drugs. Some items may interact with your medicine. What should I watch for while using this medication? Your condition will be monitored carefully while you are receiving this medication. You may need blood work while taking this medication. This medication may make you feel generally unwell. This is not uncommon, as chemotherapy can affect healthy cells as well as cancer cells. Report any side effects. Continue your course of treatment even though you feel ill unless your care team tells you to stop. In some cases, you may be given additional medications to help with side effects. Follow all directions for their use. This medication may increase your risk of getting an infection. Call your care team for advice if you get a fever, chills, sore throat, or other symptoms of a cold or flu. Do not treat yourself. Try to avoid being around people who are sick. Avoid taking medications that contain aspirin, acetaminophen, ibuprofen, naproxen, or ketoprofen unless instructed by your care team. These medications may hide a fever. Be careful brushing or flossing your  teeth or using a toothpick because you may get an infection or bleed more easily. If you have any dental work done, tell your dentist you are receiving this medication. Talk to your care team if you wish to become pregnant or think you might be pregnant. This medication can cause serious birth defects. Talk to your care team about effective forms of contraception. Do not breast-feed while taking this medication. What side effects may I notice from receiving this medication? Side effects that you should report to your care team as soon as possible: Allergic reactions--skin rash, itching, hives, swelling of the face, lips, tongue, or throat Infection--fever, chills, cough, sore throat, wounds that don't heal, pain or trouble when passing  urine, general feeling of discomfort or being unwell Low red blood cell level--unusual weakness or fatigue, dizziness, headache, trouble breathing Pain, tingling, or numbness in the hands or feet, muscle weakness, change in vision, confusion or trouble speaking, loss of balance or coordination, trouble walking, seizures Unusual bruising or bleeding Side effects that usually do not require medical attention (report to your care team if they continue or are bothersome): Hair loss Nausea Unusual weakness or fatigue Vomiting This list may not describe all possible side effects. Call your doctor for medical advice about side effects. You may report side effects to FDA at 1-800-FDA-1088. Where should I keep my medication? This medication is given in a hospital or clinic. It will not be stored at home. NOTE: This sheet is a summary. It may not cover all possible information. If you have questions about this medicine, talk to your doctor, pharmacist, or health care provider.  2024 Elsevier/Gold Standard (2021-08-02 00:00:00)

## 2023-03-02 NOTE — Progress Notes (Signed)
[facility] OFFICE PROGRESS NOTE  Patient Care Team: Burton Apley, MD as PCP - General (Internal Medicine) Paulina Fusi Servando Snare, RN as Oncology Nurse Navigator (Oncology)  ASSESSMENT & PLAN:  Primary peritoneal carcinomatosis (HCC) Overall, she tolerated treatment well We discussed future maintenance therapy and risk and benefits of each option She will complete her chemotherapy in January to be followed by CT imaging in February  No orders of the defined types were placed in this encounter.   All questions were answered. The patient knows to call the clinic with any problems, questions or concerns. The total time spent in the appointment was 20 minutes encounter with patients including review of chart and various tests results, discussions about plan of care and coordination of care plan   Artis Delay, MD 03/02/2023 9:03 AM  INTERVAL HISTORY: Please see below for problem oriented charting. she returns for chemotherapy follow-up She tolerated last cycle of treatment well without any nausea, changes in bowel habits or peripheral neuropathy We discussed the risk and benefits of future maintenance treatment  REVIEW OF SYSTEMS:   Constitutional: Denies fevers, chills or abnormal weight loss Eyes: Denies blurriness of vision Ears, nose, mouth, throat, and face: Denies mucositis or sore throat Respiratory: Denies cough, dyspnea or wheezes Cardiovascular: Denies palpitation, chest discomfort or lower extremity swelling Gastrointestinal:  Denies nausea, heartburn or change in bowel habits Skin: Denies abnormal skin rashes Lymphatics: Denies new lymphadenopathy or easy bruising Neurological:Denies numbness, tingling or new weaknesses Behavioral/Psych: Mood is stable, no new changes  All other systems were reviewed with the patient and are negative.  I have reviewed the past medical history, past surgical history, social history and family history with the patient and they are  unchanged from previous note.  ALLERGIES:  is allergic to artichoke [cynara scolymus (artichoke)].  MEDICATIONS:  Current Outpatient Medications  Medication Sig Dispense Refill   conjugated estrogens (PREMARIN) vaginal cream Place 1 applicatorful vaginally 3 (three) times a week. 42.5 g 1   dexamethasone (DECADRON) 4 MG tablet Take 2 tabs at the night before and 2 tab the morning of chemotherapy, every 3 weeks, by mouth x 6 cycles 24 tablet 6   ondansetron (ZOFRAN) 4 MG tablet Take 1-2 tablets (4-8 mg total) by mouth every 8 (eight) hours as needed for nausea or vomiting. 10 tablet 0   prochlorperazine (COMPAZINE) 10 MG tablet Take 1 tablet (10 mg total) by mouth every 6 (six) hours as needed for nausea or vomiting. 30 tablet 1   risedronate (ACTONEL) 150 MG tablet Take 150 mg by mouth every 30 (thirty) days. with water on empty stomach, nothing by mouth or lie down for next 30 minutes.     tretinoin (RETIN-A) 0.025 % cream Apply 1 application  topically at bedtime.     No current facility-administered medications for this visit.   Facility-Administered Medications Ordered in Other Visits  Medication Dose Route Frequency Provider Last Rate Last Admin   0.9 %  sodium chloride infusion   Intravenous Once Bertis Ruddy, Deshana Rominger, MD       CARBOplatin (PARAPLATIN) 400 mg in sodium chloride 0.9 % 250 mL chemo infusion  400 mg Intravenous Once Bertis Ruddy, Jontez Redfield, MD       cetirizine (QUZYTTIR) injection 10 mg  10 mg Intravenous Once Bertis Ruddy, Harjot Dibello, MD       dexamethasone (DECADRON) injection 10 mg  10 mg Intravenous Once Bertis Ruddy, Zadia Uhde, MD       famotidine (PEPCID) IVPB 20 mg premix  20 mg  Intravenous Once Artis Delay, MD       fosaprepitant (EMEND) 150 mg in sodium chloride 0.9 % 145 mL IVPB  150 mg Intravenous Once Bertis Ruddy, Arynn Armand, MD       PACLitaxel (TAXOL) 264 mg in sodium chloride 0.9 % 250 mL chemo infusion (> 80mg /m2)  175 mg/m2 (Treatment Plan Recorded) Intravenous Once Bertis Ruddy, Aniela Caniglia, MD       palonosetron (ALOXI)  injection 0.25 mg  0.25 mg Intravenous Once Artis Delay, MD        SUMMARY OF ONCOLOGIC HISTORY: Oncology History Overview Note  Neg genetics,  Molecular testing through [facility] in 2024:  HER2/neu 0, negative F0 LR 2+, 40%, negative PD-L1 CPS score 5% ER +75% HRD negative   Primary peritoneal carcinomatosis (HCC)  08/21/2020 Initial Diagnosis   The patient reports intermittent right upper quadrant pain that feel like she pulled a muscle beginning in March 2022.  In April, she woke up with sharp pain in her right upper quadrant.  This was her first episode of sharp pain.  She thought that her pain was related to her gallbladder and presented to the emergency department.  She was seen in ED on 4/30. RUQ ultrasound and x-ray were normal as were LFTs and lipase.    10/27/2020 Imaging   Findings highly suspicious for peritoneal carcinomatosis. No site of primary malignancy identified.   Colonic diverticulosis, without radiographic evidence of diverticulitis   11/02/2020 Initial Diagnosis   Primary peritoneal carcinomatosis (HCC)   11/02/2020 Tumor Marker   Patient's tumor was tested for the following markers: CA-125. Results of the tumor marker test revealed 88.   11/11/2020 Pathology Results   FINAL MICROSCOPIC DIAGNOSIS:   A. CUL DE SAC, LEFT ANTERIOR, EXCISION:  -  Poorly differentiated carcinoma  -  See comment   B. SIDEWALL, RIGHT, BIOPSY:  -  Poorly differentiated carcinoma  -  See comment   COMMENT:   By immunohistochemistry, the neoplastic cells are positive for cytokeratin 7, p53, PAX8 and WT1 but negative for cytokeratin 20, GATA3, CDX2 and TTF-1.  Overall, the immunophenotype is consistent with a  gynecologic primary.    11/11/2020 Pathology Results   FINAL MICROSCOPIC DIAGNOSIS:  - Malignant cells consistent with metastatic adenocarcinoma    11/11/2020 Surgery   Pre-operative Diagnosis: Carcinomatosis, mildly elevated CA-125   Post-operative Diagnosis: same, At least  stage IIIC gyn malignancy   Operation: Diagnostic laparoscopy, peritoneal biopsies    Surgeon: Eugene Garnet MD Operative Findings: On EUA, small mobile uterus. On intra-abdominal entry, carcinomatosis appreciated studding the right anterior diaphragm, bilateral liver surfaces, mesentery, pelvic peritoneum. Omental cake with measuring up to 2x3cm. Right ovary adherent to the right uterus with peritoneal studding adjacent. Frondular implants noted along right pelvic sidewall. Miliary disease along anterior cul de sac. Cul de sac covered with friable miliary disease. Small volume dark amber ascites. Specimens: left anterior cul de sac peritoneum, right pelvic sidewall peritoneal biopsy          11/15/2020 Cancer Staging   Staging form: Ovary, Fallopian Tube, and Primary Peritoneal Carcinoma, AJCC 8th Edition - Clinical stage from 11/15/2020: FIGO Stage III (cT3, cN0, cM0) - Signed by Artis Delay, MD on 11/15/2020 Stage prefix: Initial diagnosis   11/26/2020 - 05/13/2021 Chemotherapy   Patient is on Treatment Plan : OVARIAN Carboplatin (AUC 6) / Paclitaxel (175) q21d x 6 cycles     11/26/2020 Tumor Marker   Patient's tumor was tested for the following markers: CA-125. Results of the tumor marker test  revealed 76.8.   12/17/2020 Tumor Marker   Patient's tumor was tested for the following markers: CA-125. Results of the tumor marker test revealed 58.1.   01/07/2021 Tumor Marker   Patient's tumor was tested for the following markers: CA-125. Results of the tumor marker test revealed 21.6.   01/20/2021 Imaging   Decreased omental soft tissue nodularity and caking, consistent with interval improvement in carcinoma.   No evidence of new or progressive disease within the abdomen or pelvis.   Colonic diverticulosis. No radiographic evidence of diverticulitis.   Large stool burden noted; recommend clinical correlation for possible constipation   02/24/2021 Surgery   Robotic-assisted laparoscopic  total hysterectomy with bilateral salpingoophorectomy, peritoneal stripping, mini-lap for omentectomy and intra-abdominal palpation  Findings: On EUA, small mobile uterus. Rectum free. On intra-abdominal entry, normal upper abdominal survey including liver edge, diaphragm and stomach. Omentum with several small (<2cm) areas of thickening, suspected treated tumor versus residual tumor. Uterus 6cm and normal in appearance. Atrophic appearing bilateral adnexa. Some adhesions of the left ovary to the medial leaf of the broad ligament. Minimal peritoneal nodularity versus treated disease within posterior cul de sac and deep left pelvis, all either excised or ablated. No significant peritoneal disease or carcinomatosis. No sigmoid or rectal involvement. Small bowel normal in appearance. No adenopathy. No ascites. R0 resection.    02/24/2021 Pathology Results   A. PERTIONEAL SCAR, EXCISION:  - Microscopic focus of high-grade serous carcinoma   B. 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 OMENTUM, RESECTION:  - Microscopic foci of high-grade serous carcinoma   03/14/2021 Tumor Marker   Patient's tumor was tested for the following markers: CA-125. Results of the tumor marker test revealed 32.7.   03/23/2021 Imaging   Unremarkable right upper quadrant ultrasound   04/01/2021 Genetic Testing   HRD status through [facility] [employer] was negative.  The report date is 04/01/2021. Negative hereditary cancer genetic testing: no pathogenic variants detected in [employer] [facility].  The report date is 04/04/2021.  The [facility] gene panel offered by [employer] includes sequencing and deletion/duplication testing of the following 48 genes: APC, ATM, AXIN2, BAP1, BARD1, BMPR1A, BRCA1,  BRCA2, BRIP1, CHD1, CDK4, CDKN2A(p16 and p14ARF), CHEK2, CTNNA1, EGFR, EPCAM, FH, FLCN, GREM1, HOXB13, MEN1, MET, MITF , MLH1, MSH2, MSH3, MSH6, MUTYH, NTHL1, PALB2, PMS2, POLD1, POLE, PTEN, RAD51C, RAD51D, RET, SDHA, SDHB, SDHC, SDHD, SMAD4, STK11,TERT, TP53, TSC1, TSC2, and VHL.   04/12/2021 Tumor Marker   Patient's tumor was tested for the following markers: CA-125. Results of the tumor marker test revealed 12.7.   06/13/2021 Imaging   1. No new mass or lymphadenopathy identified in the abdomen or pelvis. Interval resolution of previous omental soft tissue caking. 2. Other ancillary findings.   06/13/2021 Tumor Marker   Patient's tumor was tested for the following markers: CA-125. Results of the tumor marker test revealed 10.1.   07/11/2021 Tumor Marker   Patient's tumor was tested for the following markers: CA-125. Results of the tumor marker test revealed 9.4.   09/08/2021 Imaging   1. Status post hysterectomy and omentectomy. 2. No evidence of lymphadenopathy or metastatic disease in the abdomen or pelvis.     09/08/2021 Tumor Marker   Patient's tumor was tested for the following markers: CA-125. Results of the  tumor marker test revealed 9.0.   02/22/2022 Imaging   IMPRESSION: 1. No acute findings.  No ascites, mass or adenopathy. 2.  Aortic Atherosclerosis (ICD10-170.0).   03/10/2022 Tumor Marker   Patient's tumor was tested for the following markers: CA-125. Results of the tumor marker test revealed 11.7.   06/09/2022 Tumor Marker   Patient's tumor was tested for the following markers: CA-125. Results of the tumor marker test revealed 29.2.   09/07/2022 Tumor Marker   Patient's tumor was tested for the following markers: CA-125. Results of the tumor marker test revealed 39.7.   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 RADIATION DOSE REDUCTION: This exam was performed according to the departmental dose-optimization program which includes automated exposure control, adjustment of the mA and/or kV according to patient size and/or use of iterative reconstruction technique. CONTRAST:  60mL OMNIPAQUE IOHEXOL 350 MG/ML SOLN COMPARISON:  06/22/2022 and previous FINDINGS: Lower chest: No pleural or pericardial effusion. Visualized lung bases grossly clear, with some motion degradation. Hepatobiliary: No focal liver abnormality is seen. No gallstones, gallbladder wall thickening, or biliary dilatation. Pancreas: Unremarkable. No pancreatic ductal dilatation or surrounding inflammatory changes. Spleen: Normal in size without focal abnormality. Accessory splenule. Adrenals/Urinary Tract: No adrenal mass. Symmetric renal parenchymal enhancement without urolithiasis or hydronephrosis. Urinary bladder incompletely distended. Stomach/Bowel: Stomach incompletely distended, unremarkable. Small bowel decompressed. Post appendectomy. Gas and fecal material partially distend the colon, without acute finding. A few scattered diverticula in the sigmoid segment. Vascular/Lymphatic: Minimal calcified aortic plaque without aneurysm or stenosis. Portal vein patent. A few scattered mesenteric lymph nodes none greater than 4 mm short axis diameter. No abdominal or pelvic adenopathy. Reproductive: Status post hysterectomy. No adnexal masses. No peritoneal nodularity. Other: No ascites. No free air. Bilateral pelvic phleboliths stable. Musculoskeletal: Stable lower lumbar spondylitic change with early grade and grade 1 anterolisthesis L4-5 and degenerative disc disease L5-S1. No fracture or worrisome bone lesion. IMPRESSION: 1. No acute findings. 2. No evidence of recurrent or metastatic disease. 3. Sigmoid  diverticulosis. Electronically Signed   By: Corlis Leak M.D.   On: 09/15/2022 20:09      11/24/2022 Tumor Marker   Patient's tumor was tested for the following markers: CA-125. Results of the tumor marker test revealed 47.6.   12/04/2022 Imaging   CT A/P 1. A new 2.4 x 2.1 cm low attenuating structure in the right pelvis abutting the vaginal cuff concerning for recurrent disease, given rise in CA 125. Clinical correlation is recommended. 2. No bowel obstruction. Appendectomy. 3.  Aortic Atherosclerosis (ICD10-I70.0).   12/20/2022 Surgery   Robotic-assisted excision of pelvic mass requiring colpotomy with vaginal cuff closure [name]), excision of left para-colic gutter implant (White), low anterior resection with reanastomosis [name]), cystoscopy [name])   Findings: On EUA, 2-3 cm mass is palpable in the vaginal apex, and close proximity to the rectum but without direct involvement.  Intra-abdominal entry, normal-appearing diaphragm, liver edge, and stomach.  No peritoneal disease or implants noted.  Normal-appearing small bowel.  Surgically absent uterus, cervix, fallopian tubes and ovaries.  Small, 5 mm cystic nodule noted along the left paracolic gutter lateral to the sigmoid colon (excised).  Within the pelvis, there was a 3 cm cystic mass, apparently bilobed, adherent to the posterior  aspect of the vaginal cuff in the midline into the right.  This was close to but not directly involving the rectum nor much of the rectal mesentery.  There was minimal spillage from the cystic lesion during manipulation to excise the specimen.  Upon resection of this area, the more superior aspect of the rectum was identified as having a similar appearing 1-1.5 cm cystic mass involving the rectum and adjacent mesentery.  The entire lesion was removed within the segment of rectosigmoid colon.  No additional implants or findings concerning for metastatic disease were noted in the abdomen or pelvis. R0 resection. On  cystoscopy, bladder dome intact. Good efflux noted from bilateral ureteral orifices.   12/20/2022 Pathology Results   A. PELVIC MASS, EXCISION:  - High-grade serous carcinoma, involving vaginal mucosa and adjacent  soft tissue   B. LEFT PERICOLIC GUTTER NODULE, EXCISION:  - High-grade serous carcinoma  - Resection margin is negative for carcinoma   C. RECTOSIGMOID COLON, RESECTION:  - High-grade serous carcinoma involving segment of rectosigmoid colon  - Lymph node, negative for carcinoma (0/1)  - Resection margins, negative for carcinoma   D. FINAL DISTAL MARGIN OF ANASTOMOTIC RING, EXCISION:  - Colonic donut, negative for carcinoma   Cytology FINAL MICROSCOPIC DIAGNOSIS:  - Malignant cells present  - See comment     01/19/2023 -  Chemotherapy   Patient is on Treatment Plan : OVARIAN Carboplatin (AUC 6) + Paclitaxel (175) q21d X 6 Cycles     01/19/2023 Tumor Marker   Patient's tumor was tested for the following markers: CA-125. Results of the tumor marker test revealed 11.2.   02/12/2023 Tumor Marker   Patient's tumor was tested for the following markers: CA-125. Results of the tumor marker test revealed 9.9.     PHYSICAL EXAMINATION: ECOG PERFORMANCE STATUS: 0 - Asymptomatic  Vitals:   03/02/23 0811  BP: (!) 143/71  Pulse: 82  Resp: 18  Temp: (!) 97.4 F (36.3 C)  SpO2: 97%   Filed Weights   03/02/23 0811  Weight: 117 lb 12.8 oz (53.4 kg)    GENERAL:alert, no distress and comfortable   LABORATORY DATA:  I have reviewed the data as listed    Component Value Date/Time   NA 138 03/02/2023 0748   K 4.2 03/02/2023 0748   CL 103 03/02/2023 0748   CO2 26 03/02/2023 0748   GLUCOSE 153 (H) 03/02/2023 0748   BUN 29 (H) 03/02/2023 0748   CREATININE 0.72 03/02/2023 0748   CALCIUM 9.9 03/02/2023 0748   PROT 7.4 03/02/2023 0748   ALBUMIN 4.5 03/02/2023 0748   AST 16 03/02/2023 0748   ALT 14 03/02/2023 0748   ALKPHOS 50 03/02/2023 0748   BILITOT 0.3  03/02/2023 0748   GFRNONAA >60 03/02/2023 0748   GFRAA  02/27/2007 1510    >60        The eGFR has been calculated using the MDRD equation. This calculation has not been validated in all clinical    No results found for: "SPEP", "UPEP"  Lab Results  Component Value Date   WBC 6.7 03/02/2023   NEUTROABS 5.8 03/02/2023   HGB 13.9 03/02/2023   HCT 40.5 03/02/2023   MCV 86.9 03/02/2023   PLT 239 03/02/2023      Chemistry      Component Value Date/Time   NA 138 03/02/2023 0748   K 4.2 03/02/2023 0748   CL 103 03/02/2023 0748   CO2 26 03/02/2023 0748   BUN 29 (H) 03/02/2023  0748   CREATININE 0.72 03/02/2023 0748      Component Value Date/Time   CALCIUM 9.9 03/02/2023 0748   ALKPHOS 50 03/02/2023 0748   AST 16 03/02/2023 0748   ALT 14 03/02/2023 0748   BILITOT 0.3 03/02/2023 0748

## 2023-03-02 NOTE — Progress Notes (Signed)
Patient seen by Dr. Eugene Gavia are within treatment parameters.  Labs reviewed: and are within treatment parameters. CMP results pending.  Per physician team, patient is ready for treatment and there are NO modifications to the treatment plan. Pending CMP results.

## 2023-03-03 LAB — CA 125: Cancer Antigen (CA) 125: 11.7 U/mL (ref 0.0–38.1)

## 2023-03-14 ENCOUNTER — Encounter: Payer: Self-pay | Admitting: Hematology and Oncology

## 2023-03-14 NOTE — Telephone Encounter (Signed)
Telephone call  

## 2023-03-15 ENCOUNTER — Encounter: Payer: Self-pay | Admitting: Hematology and Oncology

## 2023-03-15 NOTE — Telephone Encounter (Signed)
Telephone call  

## 2023-03-20 MED FILL — Fosaprepitant Dimeglumine For IV Infusion 150 MG (Base Eq): INTRAVENOUS | Qty: 5 | Status: AC

## 2023-03-21 ENCOUNTER — Inpatient Hospital Stay: Payer: Medicare Other

## 2023-03-21 VITALS — BP 126/66 | HR 82 | Temp 97.9°F | Resp 16 | Wt 116.5 lb

## 2023-03-21 DIAGNOSIS — Z5111 Encounter for antineoplastic chemotherapy: Secondary | ICD-10-CM | POA: Diagnosis not present

## 2023-03-21 DIAGNOSIS — C482 Malignant neoplasm of peritoneum, unspecified: Secondary | ICD-10-CM

## 2023-03-21 LAB — CMP (CANCER CENTER ONLY)
ALT: 16 U/L (ref 0–44)
AST: 20 U/L (ref 15–41)
Albumin: 4.6 g/dL (ref 3.5–5.0)
Alkaline Phosphatase: 47 U/L (ref 38–126)
Anion gap: 7 (ref 5–15)
BUN: 26 mg/dL — ABNORMAL HIGH (ref 8–23)
CO2: 27 mmol/L (ref 22–32)
Calcium: 9.9 mg/dL (ref 8.9–10.3)
Chloride: 104 mmol/L (ref 98–111)
Creatinine: 0.73 mg/dL (ref 0.44–1.00)
GFR, Estimated: >60 mL/min (ref >60)
Glucose, Bld: 134 mg/dL — ABNORMAL HIGH (ref 70–99)
Potassium: 4.5 mmol/L (ref 3.5–5.1)
Sodium: 138 mmol/L (ref 135–145)
Total Bilirubin: 0.4 mg/dL (ref <1.2)
Total Protein: 7.4 g/dL (ref 6.5–8.1)

## 2023-03-21 LAB — CBC WITH DIFFERENTIAL (CANCER CENTER ONLY)
Abs Immature Granulocytes: 0.01 10*3/uL (ref 0.00–0.07)
Basophils Absolute: 0 10*3/uL (ref 0.0–0.1)
Basophils Relative: 0 %
Eosinophils Absolute: 0 10*3/uL (ref 0.0–0.5)
Eosinophils Relative: 0 %
HCT: 38.1 % (ref 36.0–46.0)
Hemoglobin: 12.9 g/dL (ref 12.0–15.0)
Immature Granulocytes: 0 %
Lymphocytes Relative: 9 %
Lymphs Abs: 0.5 10*3/uL — ABNORMAL LOW (ref 0.7–4.0)
MCH: 30.2 pg (ref 26.0–34.0)
MCHC: 33.9 g/dL (ref 30.0–36.0)
MCV: 89.2 fL (ref 80.0–100.0)
Monocytes Absolute: 0.1 10*3/uL (ref 0.1–1.0)
Monocytes Relative: 3 %
Neutro Abs: 4.4 10*3/uL (ref 1.7–7.7)
Neutrophils Relative %: 88 %
Platelet Count: 183 10*3/uL (ref 150–400)
RBC: 4.27 MIL/uL (ref 3.87–5.11)
RDW: 13 % (ref 11.5–15.5)
WBC Count: 5 10*3/uL (ref 4.0–10.5)
nRBC: 0 % (ref 0.0–0.2)

## 2023-03-21 MED ORDER — SODIUM CHLORIDE 0.9 % IV SOLN: Freq: Once | INTRAVENOUS | Status: AC

## 2023-03-21 MED ORDER — PALONOSETRON HCL INJECTION 0.25 MG/5ML
0.2500 mg | Freq: Once | INTRAVENOUS | Status: AC
  Administered 2023-03-21: 0.25 mg via INTRAVENOUS
  Filled 2023-03-21: qty 5

## 2023-03-21 MED ORDER — CETIRIZINE HCL 10 MG/ML IV SOLN
10.0000 mg | Freq: Once | INTRAVENOUS | Status: AC
Start: 2023-03-21 — End: 2023-03-21
  Administered 2023-03-21: 10 mg via INTRAVENOUS
  Filled 2023-03-21: qty 1

## 2023-03-21 MED ORDER — SODIUM CHLORIDE 0.9 % IV SOLN
399.0000 mg | Freq: Once | INTRAVENOUS | Status: AC
  Administered 2023-03-21: 400 mg via INTRAVENOUS
  Filled 2023-03-21: qty 40

## 2023-03-21 MED ORDER — DEXAMETHASONE SODIUM PHOSPHATE 10 MG/ML IJ SOLN
10.0000 mg | Freq: Once | INTRAMUSCULAR | Status: AC
  Administered 2023-03-21: 10 mg via INTRAVENOUS
  Filled 2023-03-21: qty 1

## 2023-03-21 MED ORDER — FAMOTIDINE IN NACL 20-0.9 MG/50ML-% IV SOLN
20.0000 mg | Freq: Once | INTRAVENOUS | Status: AC
  Administered 2023-03-21: 20 mg via INTRAVENOUS
  Filled 2023-03-21: qty 50

## 2023-03-21 MED ORDER — SODIUM CHLORIDE 0.9 % IV SOLN
150.0000 mg | Freq: Once | INTRAVENOUS | Status: AC
  Administered 2023-03-21: 150 mg via INTRAVENOUS
  Filled 2023-03-21: qty 150

## 2023-03-21 MED ORDER — SODIUM CHLORIDE 0.9 % IV SOLN
175.0000 mg/m2 | Freq: Once | INTRAVENOUS | Status: AC
  Administered 2023-03-21: 264 mg via INTRAVENOUS
  Filled 2023-03-21: qty 44

## 2023-03-21 NOTE — Patient Instructions (Signed)
[facility] - A DEPT OF [facility]  Discharge Instructions: Thank you for choosing [facility] to provide your oncology and hematology care.   If you have a lab appointment with the Cancer Center, please go directly to the Cancer Center and check in at the registration area.   Wear comfortable clothing and clothing appropriate for easy access to any Portacath or PICC line.   We strive to give you quality time with your provider. You may need to reschedule your appointment if you arrive late (15 or more minutes).  Arriving late affects you and other patients whose appointments are after yours.  Also, if you miss three or more appointments without notifying the office, you may be dismissed from the clinic at the provider's discretion.      For prescription refill requests, have your pharmacy contact our office and allow 72 hours for refills to be completed.    Today you received the following chemotherapy and/or immunotherapy agents: Paclitaxel, Carboplatin      To help prevent nausea and vomiting after your treatment, we encourage you to take your nausea medication as directed.  BELOW ARE SYMPTOMS THAT SHOULD BE REPORTED IMMEDIATELY: *FEVER GREATER THAN 100.4 F (38 C) OR HIGHER *CHILLS OR SWEATING *NAUSEA AND VOMITING THAT IS NOT CONTROLLED WITH YOUR NAUSEA MEDICATION *UNUSUAL SHORTNESS OF BREATH *UNUSUAL BRUISING OR BLEEDING *URINARY PROBLEMS (pain or burning when urinating, or frequent urination) *BOWEL PROBLEMS (unusual diarrhea, constipation, pain near the anus) TENDERNESS IN MOUTH AND THROAT WITH OR WITHOUT PRESENCE OF ULCERS (sore throat, sores in mouth, or a toothache) UNUSUAL RASH, SWELLING OR PAIN  UNUSUAL VAGINAL DISCHARGE OR ITCHING   Items with * indicate a potential emergency and should be followed up as soon as possible or go to the Emergency Department if any problems should occur.  Please show the CHEMOTHERAPY ALERT CARD or  IMMUNOTHERAPY ALERT CARD at check-in to the Emergency Department and triage nurse.  Should you have questions after your visit or need to cancel or reschedule your appointment, please contact [facility] - A DEPT OF [facility]  Dept: [phone]  and follow the prompts.  Office hours are 8:00 a.m. to 4:30 p.m. Monday - Friday. Please note that voicemails left after 4:00 p.m. may not be returned until the following business day.  We are closed weekends and major holidays. You have access to a nurse at all times for urgent questions. Please call the main number to the clinic Dept: [phone] and follow the prompts.   For any non-urgent questions, you may also contact your provider using MyChart. We now offer e-Visits for anyone 45 and older to request care online for non-urgent symptoms. For details visit [email or web address].   Also download the MyChart app! Go to the app store, search "MyChart", open the app, select [facility], and log in with your MyChart username and password.  Paclitaxel Injection What is this medication? PACLITAXEL (PAK li TAX el) treats some types of cancer. It works by slowing down the growth of cancer cells. This medicine may be used for other purposes; ask your health care provider or pharmacist if you have questions. COMMON BRAND NAME(S): Onxol, Taxol What should I tell my care team before I take this medication? They need to know if you have any of these conditions: Heart disease Liver disease Low white blood cell levels An unusual or allergic reaction to paclitaxel, other medications, foods, dyes, or preservatives  If you or your partner are pregnant or trying to get pregnant Breast-feeding How should I use this medication? This medication is injected into a vein. It is given by your care team in a hospital or clinic setting. Talk to your care team about the use of this medication in children. While it may be given to  children for selected conditions, precautions do apply. Overdosage: If you think you have taken too much of this medicine contact a poison control center or emergency room at once. NOTE: This medicine is only for you. Do not share this medicine with others. What if I miss a dose? Keep appointments for follow-up doses. It is important not to miss your dose. Call your care team if you are unable to keep an appointment. What may interact with this medication? Do not take this medication with any of the following: Live virus vaccines Other medications may affect the way this medication works. Talk with your care team about all of the medications you take. They may suggest changes to your treatment plan to lower the risk of side effects and to make sure your medications work as intended. This list may not describe all possible interactions. Give your health care provider a list of all the medicines, herbs, non-prescription drugs, or dietary supplements you use. Also tell them if you smoke, drink alcohol, or use illegal drugs. Some items may interact with your medicine. What should I watch for while using this medication? Your condition will be monitored carefully while you are receiving this medication. You may need blood work while taking this medication. This medication may make you feel generally unwell. This is not uncommon as chemotherapy can affect healthy cells as well as cancer cells. Report any side effects. Continue your course of treatment even though you feel ill unless your care team tells you to stop. This medication can cause serious allergic reactions. To reduce the risk, your care team may give you other medications to take before receiving this one. Be sure to follow the directions from your care team. This medication may increase your risk of getting an infection. Call your care team for advice if you get a fever, chills, sore throat, or other symptoms of a cold or flu. Do not treat  yourself. Try to avoid being around people who are sick. This medication may increase your risk to bruise or bleed. Call your care team if you notice any unusual bleeding. Be careful brushing or flossing your teeth or using a toothpick because you may get an infection or bleed more easily. If you have any dental work done, tell your dentist you are receiving this medication. Talk to your care team if you may be pregnant. Serious birth defects can occur if you take this medication during pregnancy. Talk to your care team before breastfeeding. Changes to your treatment plan may be needed. What side effects may I notice from receiving this medication? Side effects that you should report to your care team as soon as possible: Allergic reactions--skin rash, itching, hives, swelling of the face, lips, tongue, or throat Heart rhythm changes--fast or irregular heartbeat, dizziness, feeling faint or lightheaded, chest pain, trouble breathing Increase in blood pressure Infection--fever, chills, cough, sore throat, wounds that don't heal, pain or trouble when passing urine, general feeling of discomfort or being unwell Low blood pressure--dizziness, feeling faint or lightheaded, blurry vision Low red blood cell level--unusual weakness or fatigue, dizziness, headache, trouble breathing Painful swelling, warmth, or redness of the skin, blisters  or sores at the infusion site Pain, tingling, or numbness in the hands or feet Slow heartbeat--dizziness, feeling faint or lightheaded, confusion, trouble breathing, unusual weakness or fatigue Unusual bruising or bleeding Side effects that usually do not require medical attention (report to your care team if they continue or are bothersome): Diarrhea Hair loss Joint pain Loss of appetite Muscle pain Nausea Vomiting This list may not describe all possible side effects. Call your doctor for medical advice about side effects. You may report side effects to FDA at  1-800-FDA-1088. Where should I keep my medication? This medication is given in a hospital or clinic. It will not be stored at home. NOTE: This sheet is a summary. It may not cover all possible information. If you have questions about this medicine, talk to your doctor, pharmacist, or health care provider.  2024 Elsevier/Gold Standard (2021-08-30 00:00:00)  Carboplatin Injection What is this medication? CARBOPLATIN (KAR boe pla tin) treats some types of cancer. It works by slowing down the growth of cancer cells. This medicine may be used for other purposes; ask your health care provider or pharmacist if you have questions. COMMON BRAND NAME(S): Paraplatin What should I tell my care team before I take this medication? They need to know if you have any of these conditions: Blood disorders Hearing problems Kidney disease Recent or ongoing radiation therapy An unusual or allergic reaction to carboplatin, cisplatin, other medications, foods, dyes, or preservatives Pregnant or trying to get pregnant Breast-feeding How should I use this medication? This medication is injected into a vein. It is given by your care team in a hospital or clinic setting. Talk to your care team about the use of this medication in children. Special care may be needed. Overdosage: If you think you have taken too much of this medicine contact a poison control center or emergency room at once. NOTE: This medicine is only for you. Do not share this medicine with others. What if I miss a dose? Keep appointments for follow-up doses. It is important not to miss your dose. Call your care team if you are unable to keep an appointment. What may interact with this medication? Medications for seizures Some antibiotics, such as amikacin, gentamicin, neomycin, streptomycin, tobramycin Vaccines This list may not describe all possible interactions. Give your health care provider a list of all the medicines, herbs,  non-prescription drugs, or dietary supplements you use. Also tell them if you smoke, drink alcohol, or use illegal drugs. Some items may interact with your medicine. What should I watch for while using this medication? Your condition will be monitored carefully while you are receiving this medication. You may need blood work while taking this medication. This medication may make you feel generally unwell. This is not uncommon, as chemotherapy can affect healthy cells as well as cancer cells. Report any side effects. Continue your course of treatment even though you feel ill unless your care team tells you to stop. In some cases, you may be given additional medications to help with side effects. Follow all directions for their use. This medication may increase your risk of getting an infection. Call your care team for advice if you get a fever, chills, sore throat, or other symptoms of a cold or flu. Do not treat yourself. Try to avoid being around people who are sick. Avoid taking medications that contain aspirin, acetaminophen, ibuprofen, naproxen, or ketoprofen unless instructed by your care team. These medications may hide a fever. Be careful brushing or flossing your  teeth or using a toothpick because you may get an infection or bleed more easily. If you have any dental work done, tell your dentist you are receiving this medication. Talk to your care team if you wish to become pregnant or think you might be pregnant. This medication can cause serious birth defects. Talk to your care team about effective forms of contraception. Do not breast-feed while taking this medication. What side effects may I notice from receiving this medication? Side effects that you should report to your care team as soon as possible: Allergic reactions--skin rash, itching, hives, swelling of the face, lips, tongue, or throat Infection--fever, chills, cough, sore throat, wounds that don't heal, pain or trouble when passing  urine, general feeling of discomfort or being unwell Low red blood cell level--unusual weakness or fatigue, dizziness, headache, trouble breathing Pain, tingling, or numbness in the hands or feet, muscle weakness, change in vision, confusion or trouble speaking, loss of balance or coordination, trouble walking, seizures Unusual bruising or bleeding Side effects that usually do not require medical attention (report to your care team if they continue or are bothersome): Hair loss Nausea Unusual weakness or fatigue Vomiting This list may not describe all possible side effects. Call your doctor for medical advice about side effects. You may report side effects to FDA at 1-800-FDA-1088. Where should I keep my medication? This medication is given in a hospital or clinic. It will not be stored at home. NOTE: This sheet is a summary. It may not cover all possible information. If you have questions about this medicine, talk to your doctor, pharmacist, or health care provider.  2024 Elsevier/Gold Standard (2021-08-02 00:00:00)

## 2023-03-22 LAB — CA 125: Cancer Antigen (CA) 125: 10.2 U/mL (ref 0.0–38.1)

## 2023-03-23 ENCOUNTER — Other Ambulatory Visit: Payer: Medicare Other

## 2023-03-23 ENCOUNTER — Ambulatory Visit: Payer: Medicare Other

## 2023-04-12 MED FILL — Fosaprepitant Dimeglumine For IV Infusion 150 MG (Base Eq): INTRAVENOUS | Qty: 5 | Status: AC

## 2023-04-13 ENCOUNTER — Inpatient Hospital Stay: Payer: Medicare Other | Attending: Hematology and Oncology

## 2023-04-13 ENCOUNTER — Inpatient Hospital Stay (HOSPITAL_BASED_OUTPATIENT_CLINIC_OR_DEPARTMENT_OTHER): Payer: Medicare Other | Admitting: Hematology and Oncology

## 2023-04-13 ENCOUNTER — Inpatient Hospital Stay: Payer: Medicare Other

## 2023-04-13 ENCOUNTER — Encounter: Payer: Self-pay | Admitting: Hematology and Oncology

## 2023-04-13 VITALS — BP 134/76 | HR 81 | Temp 98.4°F | Resp 18 | Ht 68.0 in | Wt 117.8 lb

## 2023-04-13 DIAGNOSIS — C482 Malignant neoplasm of peritoneum, unspecified: Secondary | ICD-10-CM

## 2023-04-13 DIAGNOSIS — Z5111 Encounter for antineoplastic chemotherapy: Secondary | ICD-10-CM | POA: Diagnosis present

## 2023-04-13 DIAGNOSIS — Z9079 Acquired absence of other genital organ(s): Secondary | ICD-10-CM | POA: Diagnosis not present

## 2023-04-13 DIAGNOSIS — C563 Malignant neoplasm of bilateral ovaries: Secondary | ICD-10-CM | POA: Diagnosis present

## 2023-04-13 DIAGNOSIS — Z90710 Acquired absence of both cervix and uterus: Secondary | ICD-10-CM | POA: Insufficient documentation

## 2023-04-13 DIAGNOSIS — C785 Secondary malignant neoplasm of large intestine and rectum: Secondary | ICD-10-CM | POA: Insufficient documentation

## 2023-04-13 DIAGNOSIS — Z90722 Acquired absence of ovaries, bilateral: Secondary | ICD-10-CM | POA: Insufficient documentation

## 2023-04-13 DIAGNOSIS — C786 Secondary malignant neoplasm of retroperitoneum and peritoneum: Secondary | ICD-10-CM | POA: Diagnosis present

## 2023-04-13 LAB — CBC WITH DIFFERENTIAL (CANCER CENTER ONLY)
Abs Immature Granulocytes: 0.06 10*3/uL (ref 0.00–0.07)
Basophils Absolute: 0 10*3/uL (ref 0.0–0.1)
Basophils Relative: 0 %
Eosinophils Absolute: 0 10*3/uL (ref 0.0–0.5)
Eosinophils Relative: 0 %
HCT: 39.6 % (ref 36.0–46.0)
Hemoglobin: 13.7 g/dL (ref 12.0–15.0)
Immature Granulocytes: 1 %
Lymphocytes Relative: 7 %
Lymphs Abs: 0.7 10*3/uL (ref 0.7–4.0)
MCH: 30.4 pg (ref 26.0–34.0)
MCHC: 34.6 g/dL (ref 30.0–36.0)
MCV: 88 fL (ref 80.0–100.0)
Monocytes Absolute: 0.1 10*3/uL (ref 0.1–1.0)
Monocytes Relative: 1 %
Neutro Abs: 9.5 10*3/uL — ABNORMAL HIGH (ref 1.7–7.7)
Neutrophils Relative %: 91 %
Platelet Count: 163 10*3/uL (ref 150–400)
RBC: 4.5 MIL/uL (ref 3.87–5.11)
RDW: 13.1 % (ref 11.5–15.5)
WBC Count: 10.4 10*3/uL (ref 4.0–10.5)
nRBC: 0 % (ref 0.0–0.2)

## 2023-04-13 LAB — CMP (CANCER CENTER ONLY)
ALT: 12 U/L (ref 0–44)
AST: 17 U/L (ref 15–41)
Albumin: 4.5 g/dL (ref 3.5–5.0)
Alkaline Phosphatase: 48 U/L (ref 38–126)
Anion gap: 11 (ref 5–15)
BUN: 27 mg/dL — ABNORMAL HIGH (ref 8–23)
CO2: 25 mmol/L (ref 22–32)
Calcium: 9.6 mg/dL (ref 8.9–10.3)
Chloride: 103 mmol/L (ref 98–111)
Creatinine: 0.76 mg/dL (ref 0.44–1.00)
GFR, Estimated: >60 mL/min
Glucose, Bld: 200 mg/dL — ABNORMAL HIGH (ref 70–99)
Potassium: 3.9 mmol/L (ref 3.5–5.1)
Sodium: 139 mmol/L (ref 135–145)
Total Bilirubin: 0.4 mg/dL
Total Protein: 6.9 g/dL (ref 6.5–8.1)

## 2023-04-13 MED ORDER — DEXAMETHASONE SODIUM PHOSPHATE 10 MG/ML IJ SOLN
10.0000 mg | Freq: Once | INTRAMUSCULAR | Status: AC
  Administered 2023-04-13: 10 mg via INTRAVENOUS
  Filled 2023-04-13: qty 1

## 2023-04-13 MED ORDER — PALONOSETRON HCL INJECTION 0.25 MG/5ML
0.2500 mg | Freq: Once | INTRAVENOUS | Status: AC
  Administered 2023-04-13: 0.25 mg via INTRAVENOUS
  Filled 2023-04-13: qty 5

## 2023-04-13 MED ORDER — SODIUM CHLORIDE 0.9 % IV SOLN
399.0000 mg | Freq: Once | INTRAVENOUS | Status: AC
  Administered 2023-04-13: 400 mg via INTRAVENOUS
  Filled 2023-04-13: qty 40

## 2023-04-13 MED ORDER — FOSAPREPITANT DIMEGLUMINE INJECTION 150 MG
150.0000 mg | Freq: Once | INTRAVENOUS | Status: AC
  Administered 2023-04-13: 150 mg via INTRAVENOUS
  Filled 2023-04-13: qty 150

## 2023-04-13 MED ORDER — SODIUM CHLORIDE 0.9 % IV SOLN: Freq: Once | INTRAVENOUS | Status: AC

## 2023-04-13 MED ORDER — SODIUM CHLORIDE 0.9 % IV SOLN
175.0000 mg/m2 | Freq: Once | INTRAVENOUS | Status: AC
  Administered 2023-04-13: 264 mg via INTRAVENOUS
  Filled 2023-04-13: qty 44

## 2023-04-13 MED ORDER — CETIRIZINE HCL 10 MG/ML IV SOLN
10.0000 mg | Freq: Once | INTRAVENOUS | Status: AC
  Administered 2023-04-13: 10 mg via INTRAVENOUS
  Filled 2023-04-13: qty 1

## 2023-04-13 MED ORDER — FAMOTIDINE IN NACL 20-0.9 MG/50ML-% IV SOLN
20.0000 mg | Freq: Once | INTRAVENOUS | Status: AC
  Administered 2023-04-13: 20 mg via INTRAVENOUS
  Filled 2023-04-13: qty 50

## 2023-04-13 NOTE — Assessment & Plan Note (Signed)
 Overall, she tolerated treatment well We discussed future maintenance therapy and risk and benefits of each option She will complete her chemotherapy in January to be followed by CT imaging in February

## 2023-04-13 NOTE — Progress Notes (Signed)
[facility] OFFICE PROGRESS NOTE  Patient Care Team: Burton Apley, MD as PCP - General (Internal Medicine) Paulina Fusi Servando Snare, RN as Oncology Nurse Navigator (Oncology)  ASSESSMENT & PLAN:  Primary peritoneal carcinomatosis (HCC) Overall, she tolerated treatment well We discussed future maintenance therapy and risk and benefits of each option She will complete her chemotherapy in January to be followed by CT imaging in February  No orders of the defined types were placed in this encounter.   All questions were answered. The patient knows to call the clinic with any problems, questions or concerns. The total time spent in the appointment was 25 minutes encounter with patients including review of chart and various tests results, discussions about plan of care and coordination of care plan   Artis Delay, MD 04/13/2023 1:06 PM  INTERVAL HISTORY: Please see below for problem oriented charting. she returns for chemotherapy follow-up She tolerated recent treatment well Denies nausea, constipation or neuropathy She has brief episodes of fatigue that resolved after few days  REVIEW OF SYSTEMS:   Constitutional: Denies fevers, chills or abnormal weight loss Eyes: Denies blurriness of vision Ears, nose, mouth, throat, and face: Denies mucositis or sore throat Respiratory: Denies cough, dyspnea or wheezes Cardiovascular: Denies palpitation, chest discomfort or lower extremity swelling Gastrointestinal:  Denies nausea, heartburn or change in bowel habits Skin: Denies abnormal skin rashes Lymphatics: Denies new lymphadenopathy or easy bruising Neurological:Denies numbness, tingling or new weaknesses Behavioral/Psych: Mood is stable, no new changes  All other systems were reviewed with the patient and are negative.  I have reviewed the past medical history, past surgical history, social history and family history with the patient and they are unchanged from previous  note.  ALLERGIES:  is allergic to artichoke [cynara scolymus (artichoke)].  MEDICATIONS:  Current Outpatient Medications  Medication Sig Dispense Refill   conjugated estrogens (PREMARIN) vaginal cream Place 1 applicatorful vaginally 3 (three) times a week. 42.5 g 1   dexamethasone (DECADRON) 4 MG tablet Take 2 tabs at the night before and 2 tab the morning of chemotherapy, every 3 weeks, by mouth x 6 cycles 24 tablet 6   ondansetron (ZOFRAN) 4 MG tablet Take 1-2 tablets (4-8 mg total) by mouth every 8 (eight) hours as needed for nausea or vomiting. 10 tablet 0   prochlorperazine (COMPAZINE) 10 MG tablet Take 1 tablet (10 mg total) by mouth every 6 (six) hours as needed for nausea or vomiting. 30 tablet 1   risedronate (ACTONEL) 150 MG tablet Take 150 mg by mouth every 30 (thirty) days. with water on empty stomach, nothing by mouth or lie down for next 30 minutes.     tretinoin (RETIN-A) 0.025 % cream Apply 1 application  topically at bedtime.     No current facility-administered medications for this visit.   Facility-Administered Medications Ordered in Other Visits  Medication Dose Route Frequency Provider Last Rate Last Admin   CARBOplatin (PARAPLATIN) 400 mg in sodium chloride 0.9 % 250 mL chemo infusion  400 mg Intravenous Once Bertis Ruddy, Kahlie Deutscher, MD       PACLitaxel (TAXOL) 264 mg in sodium chloride 0.9 % 250 mL chemo infusion (> 80mg /m2)  175 mg/m2 (Treatment Plan Recorded) Intravenous Once Artis Delay, MD 98 mL/hr at 04/13/23 1151 264 mg at 04/13/23 1151    SUMMARY OF ONCOLOGIC HISTORY: Oncology History Overview Note  Neg genetics,  Molecular testing through [facility] in 2024:  HER2/neu 0, negative F0 LR 2+, 40%, negative PD-L1 CPS score 5% ER +75%  HRD negative   Primary peritoneal carcinomatosis (HCC)  08/21/2020 Initial Diagnosis   The patient reports intermittent right upper quadrant pain that feel like she pulled a muscle beginning in March 2022.  In April, she woke up with sharp pain  in her right upper quadrant.  This was her first episode of sharp pain.  She thought that her pain was related to her gallbladder and presented to the emergency department.  She was seen in ED on 4/30. RUQ ultrasound and x-ray were normal as were LFTs and lipase.    10/27/2020 Imaging   Findings highly suspicious for peritoneal carcinomatosis. No site of primary malignancy identified.   Colonic diverticulosis, without radiographic evidence of diverticulitis   11/02/2020 Initial Diagnosis   Primary peritoneal carcinomatosis (HCC)   11/02/2020 Tumor Marker   Patient's tumor was tested for the following markers: CA-125. Results of the tumor marker test revealed 88.   11/11/2020 Pathology Results   FINAL MICROSCOPIC DIAGNOSIS:   A. CUL DE SAC, LEFT ANTERIOR, EXCISION:  -  Poorly differentiated carcinoma  -  See comment   B. SIDEWALL, RIGHT, BIOPSY:  -  Poorly differentiated carcinoma  -  See comment   COMMENT:   By immunohistochemistry, the neoplastic cells are positive for cytokeratin 7, p53, PAX8 and WT1 but negative for cytokeratin 20, GATA3, CDX2 and TTF-1.  Overall, the immunophenotype is consistent with a  gynecologic primary.    11/11/2020 Pathology Results   FINAL MICROSCOPIC DIAGNOSIS:  - Malignant cells consistent with metastatic adenocarcinoma    11/11/2020 Surgery   Pre-operative Diagnosis: Carcinomatosis, mildly elevated CA-125   Post-operative Diagnosis: same, At least stage IIIC gyn malignancy   Operation: Diagnostic laparoscopy, peritoneal biopsies    Surgeon: Eugene Garnet MD Operative Findings: On EUA, small mobile uterus. On intra-abdominal entry, carcinomatosis appreciated studding the right anterior diaphragm, bilateral liver surfaces, mesentery, pelvic peritoneum. Omental cake with measuring up to 2x3cm. Right ovary adherent to the right uterus with peritoneal studding adjacent. Frondular implants noted along right pelvic sidewall. Miliary disease along  anterior cul de sac. Cul de sac covered with friable miliary disease. Small volume dark amber ascites. Specimens: left anterior cul de sac peritoneum, right pelvic sidewall peritoneal biopsy          11/15/2020 Cancer Staging   Staging form: Ovary, Fallopian Tube, and Primary Peritoneal Carcinoma, AJCC 8th Edition - Clinical stage from 11/15/2020: FIGO Stage III (cT3, cN0, cM0) - Signed by Artis Delay, MD on 11/15/2020 Stage prefix: Initial diagnosis   11/26/2020 - 05/13/2021 Chemotherapy   Patient is on Treatment Plan : OVARIAN Carboplatin (AUC 6) / Paclitaxel (175) q21d x 6 cycles     11/26/2020 Tumor Marker   Patient's tumor was tested for the following markers: CA-125. Results of the tumor marker test revealed 76.8.   12/17/2020 Tumor Marker   Patient's tumor was tested for the following markers: CA-125. Results of the tumor marker test revealed 58.1.   01/07/2021 Tumor Marker   Patient's tumor was tested for the following markers: CA-125. Results of the tumor marker test revealed 21.6.   01/20/2021 Imaging   Decreased omental soft tissue nodularity and caking, consistent with interval improvement in carcinoma.   No evidence of new or progressive disease within the abdomen or pelvis.   Colonic diverticulosis. No radiographic evidence of diverticulitis.   Large stool burden noted; recommend clinical correlation for possible constipation   02/24/2021 Surgery   Robotic-assisted laparoscopic total hysterectomy with bilateral salpingoophorectomy, peritoneal stripping, mini-lap for omentectomy  and intra-abdominal palpation  Findings: On EUA, small mobile uterus. Rectum free. On intra-abdominal entry, normal upper abdominal survey including liver edge, diaphragm and stomach. Omentum with several small (<2cm) areas of thickening, suspected treated tumor versus residual tumor. Uterus 6cm and normal in appearance. Atrophic appearing bilateral adnexa. Some adhesions of the left ovary to the medial  leaf of the broad ligament. Minimal peritoneal nodularity versus treated disease within posterior cul de sac and deep left pelvis, all either excised or ablated. No significant peritoneal disease or carcinomatosis. No sigmoid or rectal involvement. Small bowel normal in appearance. No adenopathy. No ascites. R0 resection.    02/24/2021 Pathology Results   A. PERTIONEAL SCAR, EXCISION:  - Microscopic focus of high-grade serous carcinoma   B. 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 OMENTUM, RESECTION:  - Microscopic foci of high-grade serous carcinoma   03/14/2021 Tumor Marker   Patient's tumor was tested for the following markers: CA-125. Results of the tumor marker test revealed 32.7.   03/23/2021 Imaging   Unremarkable right upper quadrant ultrasound   04/01/2021 Genetic Testing   HRD status through [facility] was negative.  The report date is 04/01/2021. Negative hereditary cancer genetic testing: no pathogenic variants detected in [facility].  The report date is 04/04/2021.  The [facility] gene panel offered by [employer] includes sequencing and deletion/duplication testing of the following 48 genes: APC, ATM, AXIN2, BAP1, BARD1, BMPR1A, BRCA1, BRCA2, BRIP1, CHD1, CDK4, CDKN2A(p16 and p14ARF), CHEK2, CTNNA1, EGFR, EPCAM, FH, FLCN, GREM1, HOXB13, MEN1, MET, MITF , MLH1, MSH2, MSH3, MSH6, MUTYH, NTHL1, PALB2, PMS2, POLD1, POLE, PTEN, RAD51C, RAD51D, RET, SDHA, SDHB, SDHC, SDHD, SMAD4, STK11,TERT, TP53, TSC1, TSC2, and VHL.   04/12/2021 Tumor Marker   Patient's tumor was tested for the following markers: CA-125. Results of the tumor marker test revealed 12.7.   06/13/2021 Imaging   1. No new mass or lymphadenopathy identified in the abdomen or  pelvis. Interval resolution of previous omental soft tissue caking. 2. Other ancillary findings.   06/13/2021 Tumor Marker   Patient's tumor was tested for the following markers: CA-125. Results of the tumor marker test revealed 10.1.   07/11/2021 Tumor Marker   Patient's tumor was tested for the following markers: CA-125. Results of the tumor marker test revealed 9.4.   09/08/2021 Imaging   1. Status post hysterectomy and omentectomy. 2. No evidence of lymphadenopathy or metastatic disease in the abdomen or pelvis.     09/08/2021 Tumor Marker   Patient's tumor was tested for the following markers: CA-125. Results of the tumor marker test revealed 9.0.   02/22/2022 Imaging   IMPRESSION: 1. No acute findings.  No ascites, mass or adenopathy. 2.  Aortic Atherosclerosis (ICD10-170.0).   03/10/2022 Tumor Marker   Patient's tumor was tested for the following markers: CA-125. Results of the tumor marker test revealed 11.7.   06/09/2022 Tumor Marker   Patient's tumor was tested for the following markers: CA-125. Results of the tumor marker test revealed 29.2.   09/07/2022 Tumor Marker   Patient's tumor was tested for the following markers: CA-125. Results of the tumor marker test revealed 39.7.   09/15/2022 Imaging   CT ABDOMEN PELVIS W CONTRAST  Result Date: 09/15/2022 CLINICAL DATA:  History of ovarian carcinoma, elevated  CA-125 EXAM: CT ABDOMEN AND PELVIS WITH CONTRAST TECHNIQUE: Multidetector CT imaging of the abdomen and pelvis was performed using the standard protocol following bolus administration of intravenous contrast. RADIATION DOSE REDUCTION: This exam was performed according to the departmental dose-optimization program which includes automated exposure control, adjustment of the mA and/or kV according to patient size and/or use of iterative reconstruction technique. CONTRAST:  60mL OMNIPAQUE IOHEXOL 350 MG/ML SOLN COMPARISON:  06/22/2022 and previous FINDINGS: Lower chest: No  pleural or pericardial effusion. Visualized lung bases grossly clear, with some motion degradation. Hepatobiliary: No focal liver abnormality is seen. No gallstones, gallbladder wall thickening, or biliary dilatation. Pancreas: Unremarkable. No pancreatic ductal dilatation or surrounding inflammatory changes. Spleen: Normal in size without focal abnormality. Accessory splenule. Adrenals/Urinary Tract: No adrenal mass. Symmetric renal parenchymal enhancement without urolithiasis or hydronephrosis. Urinary bladder incompletely distended. Stomach/Bowel: Stomach incompletely distended, unremarkable. Small bowel decompressed. Post appendectomy. Gas and fecal material partially distend the colon, without acute finding. A few scattered diverticula in the sigmoid segment. Vascular/Lymphatic: Minimal calcified aortic plaque without aneurysm or stenosis. Portal vein patent. A few scattered mesenteric lymph nodes none greater than 4 mm short axis diameter. No abdominal or pelvic adenopathy. Reproductive: Status post hysterectomy. No adnexal masses. No peritoneal nodularity. Other: No ascites. No free air. Bilateral pelvic phleboliths stable. Musculoskeletal: Stable lower lumbar spondylitic change with early grade and grade 1 anterolisthesis L4-5 and degenerative disc disease L5-S1. No fracture or worrisome bone lesion. IMPRESSION: 1. No acute findings. 2. No evidence of recurrent or metastatic disease. 3. Sigmoid diverticulosis. Electronically Signed   By: Corlis Leak M.D.   On: 09/15/2022 20:09      11/24/2022 Tumor Marker   Patient's tumor was tested for the following markers: CA-125. Results of the tumor marker test revealed 47.6.   12/04/2022 Imaging   CT A/P 1. A new 2.4 x 2.1 cm low attenuating structure in the right pelvis abutting the vaginal cuff concerning for recurrent disease, given rise in CA 125. Clinical correlation is recommended. 2. No bowel obstruction. Appendectomy. 3.  Aortic Atherosclerosis  (ICD10-I70.0).   12/20/2022 Surgery   Robotic-assisted excision of pelvic mass requiring colpotomy with vaginal cuff closure [name]), excision of left para-colic gutter implant (White), low anterior resection with reanastomosis [name]), cystoscopy [name])   Findings: On EUA, 2-3 cm mass is palpable in the vaginal apex, and close proximity to the rectum but without direct involvement.  Intra-abdominal entry, normal-appearing diaphragm, liver edge, and stomach.  No peritoneal disease or implants noted.  Normal-appearing small bowel.  Surgically absent uterus, cervix, fallopian tubes and ovaries.  Small, 5 mm cystic nodule noted along the left paracolic gutter lateral to the sigmoid colon (excised).  Within the pelvis, there was a 3 cm cystic mass, apparently bilobed, adherent to the posterior aspect of the vaginal cuff in the midline into the right.  This was close to but not directly involving the rectum nor much of the rectal mesentery.  There was minimal spillage from the cystic lesion during manipulation to excise the specimen.  Upon resection of this area, the more superior aspect of the rectum was identified as having a similar appearing 1-1.5 cm cystic mass involving the rectum and adjacent mesentery.  The entire lesion was removed within the segment of rectosigmoid colon.  No additional implants or findings concerning for metastatic disease were noted in the abdomen or pelvis. R0 resection. On cystoscopy, bladder dome intact. Good efflux noted from bilateral ureteral orifices.   12/20/2022 Pathology Results  A. PELVIC MASS, EXCISION:  - High-grade serous carcinoma, involving vaginal mucosa and adjacent  soft tissue   B. LEFT PERICOLIC GUTTER NODULE, EXCISION:  - High-grade serous carcinoma  - Resection margin is negative for carcinoma   C. RECTOSIGMOID COLON, RESECTION:  - High-grade serous carcinoma involving segment of rectosigmoid colon  - Lymph node, negative for carcinoma (0/1)  -  Resection margins, negative for carcinoma   D. FINAL DISTAL MARGIN OF ANASTOMOTIC RING, EXCISION:  - Colonic donut, negative for carcinoma   Cytology FINAL MICROSCOPIC DIAGNOSIS:  - Malignant cells present  - See comment     01/19/2023 -  Chemotherapy   Patient is on Treatment Plan : OVARIAN Carboplatin (AUC 6) + Paclitaxel (175) q21d X 6 Cycles     01/19/2023 Tumor Marker   Patient's tumor was tested for the following markers: CA-125. Results of the tumor marker test revealed 11.2.   02/12/2023 Tumor Marker   Patient's tumor was tested for the following markers: CA-125. Results of the tumor marker test revealed 9.9.   03/05/2023 Tumor Marker   Patient's tumor was tested for the following markers: CA-125. Results of the tumor marker test revealed 11.7.   03/21/2023 Tumor Marker   Patient's tumor was tested for the following markers: CA-125. Results of the tumor marker test revealed 10.2.     PHYSICAL EXAMINATION: ECOG PERFORMANCE STATUS: 0 - Asymptomatic  Vitals:   04/13/23 0921  BP: 134/76  Pulse: 81  Resp: 18  Temp: 98.4 F (36.9 C)  SpO2: 98%   Filed Weights   04/13/23 0921  Weight: 117 lb 12.8 oz (53.4 kg)    GENERAL:alert, no distress and comfortable NEURO: alert & oriented x 3 with fluent speech, no focal motor/sensory deficits  LABORATORY DATA:  I have reviewed the data as listed    Component Value Date/Time   NA 139 04/13/2023 0828   K 3.9 04/13/2023 0828   CL 103 04/13/2023 0828   CO2 25 04/13/2023 0828   GLUCOSE 200 (H) 04/13/2023 0828   BUN 27 (H) 04/13/2023 0828   CREATININE 0.76 04/13/2023 0828   CALCIUM 9.6 04/13/2023 0828   PROT 6.9 04/13/2023 0828   ALBUMIN 4.5 04/13/2023 0828   AST 17 04/13/2023 0828   ALT 12 04/13/2023 0828   ALKPHOS 48 04/13/2023 0828   BILITOT 0.4 04/13/2023 0828   GFRNONAA >60 04/13/2023 0828   GFRAA  02/27/2007 1510    >60        The eGFR has been calculated using the MDRD equation. This calculation has  not been validated in all clinical    No results found for: "SPEP", "UPEP"  Lab Results  Component Value Date   WBC 10.4 04/13/2023   NEUTROABS 9.5 (H) 04/13/2023   HGB 13.7 04/13/2023   HCT 39.6 04/13/2023   MCV 88.0 04/13/2023   PLT 163 04/13/2023      Chemistry      Component Value Date/Time   NA 139 04/13/2023 0828   K 3.9 04/13/2023 0828   CL 103 04/13/2023 0828   CO2 25 04/13/2023 0828   BUN 27 (H) 04/13/2023 0828   CREATININE 0.76 04/13/2023 0828      Component Value Date/Time   CALCIUM 9.6 04/13/2023 0828   ALKPHOS 48 04/13/2023 0828   AST 17 04/13/2023 0828   ALT 12 04/13/2023 0828   BILITOT 0.4 04/13/2023 1610

## 2023-04-13 NOTE — Patient Instructions (Signed)
 [facility] - A DEPT OF [facility]  Discharge Instructions: Thank you for choosing [facility] to provide your oncology and hematology care.   If you have a lab appointment with the Cancer Center, please go directly to the Cancer Center and check in at the registration area.   Wear comfortable clothing and clothing appropriate for easy access to any Portacath or PICC line.   We strive to give you quality time with your provider. You may need to reschedule your appointment if you arrive late (15 or more minutes).  Arriving late affects you and other patients whose appointments are after yours.  Also, if you miss three or more appointments without notifying the office, you may be dismissed from the clinic at the provider's discretion.      For prescription refill requests, have your pharmacy contact our office and allow 72 hours for refills to be completed.    Today you received the following chemotherapy and/or immunotherapy agents: Paclitaxel, Carboplatin      To help prevent nausea and vomiting after your treatment, we encourage you to take your nausea medication as directed.  BELOW ARE SYMPTOMS THAT SHOULD BE REPORTED IMMEDIATELY: *FEVER GREATER THAN 100.4 F (38 C) OR HIGHER *CHILLS OR SWEATING *NAUSEA AND VOMITING THAT IS NOT CONTROLLED WITH YOUR NAUSEA MEDICATION *UNUSUAL SHORTNESS OF BREATH *UNUSUAL BRUISING OR BLEEDING *URINARY PROBLEMS (pain or burning when urinating, or frequent urination) *BOWEL PROBLEMS (unusual diarrhea, constipation, pain near the anus) TENDERNESS IN MOUTH AND THROAT WITH OR WITHOUT PRESENCE OF ULCERS (sore throat, sores in mouth, or a toothache) UNUSUAL RASH, SWELLING OR PAIN  UNUSUAL VAGINAL DISCHARGE OR ITCHING   Items with * indicate a potential emergency and should be followed up as soon as possible or go to the Emergency Department if any problems should occur.  Please show the CHEMOTHERAPY ALERT CARD or  IMMUNOTHERAPY ALERT CARD at check-in to the Emergency Department and triage nurse.  Should you have questions after your visit or need to cancel or reschedule your appointment, please contact [facility] - A DEPT OF [facility]  Dept: [phone]  and follow the prompts.  Office hours are 8:00 a.m. to 4:30 p.m. Monday - Friday. Please note that voicemails left after 4:00 p.m. may not be returned until the following business day.  We are closed weekends and major holidays. You have access to a nurse at all times for urgent questions. Please call the main number to the clinic Dept: [phone] and follow the prompts.   For any non-urgent questions, you may also contact your provider using MyChart. We now offer e-Visits for anyone 33 and older to request care online for non-urgent symptoms. For details visit [email or web address].   Also download the MyChart app! Go to the app store, search "MyChart", open the app, select [facility], and log in with your MyChart username and password.  Paclitaxel Injection What is this medication? PACLITAXEL (PAK li TAX el) treats some types of cancer. It works by slowing down the growth of cancer cells. This medicine may be used for other purposes; ask your health care provider or pharmacist if you have questions. COMMON BRAND NAME(S): Onxol, Taxol What should I tell my care team before I take this medication? They need to know if you have any of these conditions: Heart disease Liver disease Low white blood cell levels An unusual or allergic reaction to paclitaxel, other medications,  foods, dyes, or preservatives If you or your partner are pregnant or trying to get pregnant Breast-feeding How should I use this medication? This medication is injected into a vein. It is given by your care team in a hospital or clinic setting. Talk to your care team about the use of this medication in children. While it may be given to children  for selected conditions, precautions do apply. Overdosage: If you think you have taken too much of this medicine contact a poison control center or emergency room at once. NOTE: This medicine is only for you. Do not share this medicine with others. What if I miss a dose? Keep appointments for follow-up doses. It is important not to miss your dose. Call your care team if you are unable to keep an appointment. What may interact with this medication? Do not take this medication with any of the following: Live virus vaccines Other medications may affect the way this medication works. Talk with your care team about all of the medications you take. They may suggest changes to your treatment plan to lower the risk of side effects and to make sure your medications work as intended. This list may not describe all possible interactions. Give your health care provider a list of all the medicines, herbs, non-prescription drugs, or dietary supplements you use. Also tell them if you smoke, drink alcohol, or use illegal drugs. Some items may interact with your medicine. What should I watch for while using this medication? Your condition will be monitored carefully while you are receiving this medication. You may need blood work while taking this medication. This medication may make you feel generally unwell. This is not uncommon as chemotherapy can affect healthy cells as well as cancer cells. Report any side effects. Continue your course of treatment even though you feel ill unless your care team tells you to stop. This medication can cause serious allergic reactions. To reduce the risk, your care team may give you other medications to take before receiving this one. Be sure to follow the directions from your care team. This medication may increase your risk of getting an infection. Call your care team for advice if you get a fever, chills, sore throat, or other symptoms of a cold or flu. Do not treat yourself. Try  to avoid being around people who are sick. This medication may increase your risk to bruise or bleed. Call your care team if you notice any unusual bleeding. Be careful brushing or flossing your teeth or using a toothpick because you may get an infection or bleed more easily. If you have any dental work done, tell your dentist you are receiving this medication. Talk to your care team if you may be pregnant. Serious birth defects can occur if you take this medication during pregnancy. Talk to your care team before breastfeeding. Changes to your treatment plan may be needed. What side effects may I notice from receiving this medication? Side effects that you should report to your care team as soon as possible: Allergic reactions--skin rash, itching, hives, swelling of the face, lips, tongue, or throat Heart rhythm changes--fast or irregular heartbeat, dizziness, feeling faint or lightheaded, chest pain, trouble breathing Increase in blood pressure Infection--fever, chills, cough, sore throat, wounds that don't heal, pain or trouble when passing urine, general feeling of discomfort or being unwell Low blood pressure--dizziness, feeling faint or lightheaded, blurry vision Low red blood cell level--unusual weakness or fatigue, dizziness, headache, trouble breathing Painful swelling, warmth, or redness  of the skin, blisters or sores at the infusion site Pain, tingling, or numbness in the hands or feet Slow heartbeat--dizziness, feeling faint or lightheaded, confusion, trouble breathing, unusual weakness or fatigue Unusual bruising or bleeding Side effects that usually do not require medical attention (report to your care team if they continue or are bothersome): Diarrhea Hair loss Joint pain Loss of appetite Muscle pain Nausea Vomiting This list may not describe all possible side effects. Call your doctor for medical advice about side effects. You may report side effects to FDA at  1-800-FDA-1088. Where should I keep my medication? This medication is given in a hospital or clinic. It will not be stored at home. NOTE: This sheet is a summary. It may not cover all possible information. If you have questions about this medicine, talk to your doctor, pharmacist, or health care provider.  2024 Elsevier/Gold Standard (2021-08-30 00:00:00)  Carboplatin Injection What is this medication? CARBOPLATIN (KAR boe pla tin) treats some types of cancer. It works by slowing down the growth of cancer cells. This medicine may be used for other purposes; ask your health care provider or pharmacist if you have questions. COMMON BRAND NAME(S): Paraplatin What should I tell my care team before I take this medication? They need to know if you have any of these conditions: Blood disorders Hearing problems Kidney disease Recent or ongoing radiation therapy An unusual or allergic reaction to carboplatin, cisplatin, other medications, foods, dyes, or preservatives Pregnant or trying to get pregnant Breast-feeding How should I use this medication? This medication is injected into a vein. It is given by your care team in a hospital or clinic setting. Talk to your care team about the use of this medication in children. Special care may be needed. Overdosage: If you think you have taken too much of this medicine contact a poison control center or emergency room at once. NOTE: This medicine is only for you. Do not share this medicine with others. What if I miss a dose? Keep appointments for follow-up doses. It is important not to miss your dose. Call your care team if you are unable to keep an appointment. What may interact with this medication? Medications for seizures Some antibiotics, such as amikacin, gentamicin, neomycin, streptomycin, tobramycin Vaccines This list may not describe all possible interactions. Give your health care provider a list of all the medicines, herbs,  non-prescription drugs, or dietary supplements you use. Also tell them if you smoke, drink alcohol, or use illegal drugs. Some items may interact with your medicine. What should I watch for while using this medication? Your condition will be monitored carefully while you are receiving this medication. You may need blood work while taking this medication. This medication may make you feel generally unwell. This is not uncommon, as chemotherapy can affect healthy cells as well as cancer cells. Report any side effects. Continue your course of treatment even though you feel ill unless your care team tells you to stop. In some cases, you may be given additional medications to help with side effects. Follow all directions for their use. This medication may increase your risk of getting an infection. Call your care team for advice if you get a fever, chills, sore throat, or other symptoms of a cold or flu. Do not treat yourself. Try to avoid being around people who are sick. Avoid taking medications that contain aspirin, acetaminophen, ibuprofen, naproxen, or ketoprofen unless instructed by your care team. These medications may hide a fever. Be careful  brushing or flossing your teeth or using a toothpick because you may get an infection or bleed more easily. If you have any dental work done, tell your dentist you are receiving this medication. Talk to your care team if you wish to become pregnant or think you might be pregnant. This medication can cause serious birth defects. Talk to your care team about effective forms of contraception. Do not breast-feed while taking this medication. What side effects may I notice from receiving this medication? Side effects that you should report to your care team as soon as possible: Allergic reactions--skin rash, itching, hives, swelling of the face, lips, tongue, or throat Infection--fever, chills, cough, sore throat, wounds that don't heal, pain or trouble when passing  urine, general feeling of discomfort or being unwell Low red blood cell level--unusual weakness or fatigue, dizziness, headache, trouble breathing Pain, tingling, or numbness in the hands or feet, muscle weakness, change in vision, confusion or trouble speaking, loss of balance or coordination, trouble walking, seizures Unusual bruising or bleeding Side effects that usually do not require medical attention (report to your care team if they continue or are bothersome): Hair loss Nausea Unusual weakness or fatigue Vomiting This list may not describe all possible side effects. Call your doctor for medical advice about side effects. You may report side effects to FDA at 1-800-FDA-1088. Where should I keep my medication? This medication is given in a hospital or clinic. It will not be stored at home. NOTE: This sheet is a summary. It may not cover all possible information. If you have questions about this medicine, talk to your doctor, pharmacist, or health care provider.  2024 Elsevier/Gold Standard (2021-08-02 00:00:00)

## 2023-04-14 LAB — CA 125: Cancer Antigen (CA) 125: 10.4 U/mL (ref 0.0–38.1)

## 2023-05-03 MED FILL — Fosaprepitant Dimeglumine For IV Infusion 150 MG (Base Eq): INTRAVENOUS | Qty: 5 | Status: AC

## 2023-05-04 ENCOUNTER — Inpatient Hospital Stay (HOSPITAL_BASED_OUTPATIENT_CLINIC_OR_DEPARTMENT_OTHER): Payer: Medicare Other | Admitting: Hematology and Oncology

## 2023-05-04 ENCOUNTER — Inpatient Hospital Stay: Payer: Medicare Other | Attending: Hematology and Oncology

## 2023-05-04 VITALS — BP 111/66 | HR 75 | Resp 18

## 2023-05-04 VITALS — BP 137/71 | HR 86 | Temp 98.3°F | Resp 18 | Ht 68.0 in | Wt 118.4 lb

## 2023-05-04 DIAGNOSIS — C786 Secondary malignant neoplasm of retroperitoneum and peritoneum: Secondary | ICD-10-CM | POA: Insufficient documentation

## 2023-05-04 DIAGNOSIS — Z90710 Acquired absence of both cervix and uterus: Secondary | ICD-10-CM | POA: Insufficient documentation

## 2023-05-04 DIAGNOSIS — Z5111 Encounter for antineoplastic chemotherapy: Secondary | ICD-10-CM | POA: Insufficient documentation

## 2023-05-04 DIAGNOSIS — G620 Drug-induced polyneuropathy: Secondary | ICD-10-CM

## 2023-05-04 DIAGNOSIS — C563 Malignant neoplasm of bilateral ovaries: Secondary | ICD-10-CM | POA: Diagnosis present

## 2023-05-04 DIAGNOSIS — T451X5A Adverse effect of antineoplastic and immunosuppressive drugs, initial encounter: Secondary | ICD-10-CM | POA: Diagnosis not present

## 2023-05-04 DIAGNOSIS — Z9079 Acquired absence of other genital organ(s): Secondary | ICD-10-CM | POA: Insufficient documentation

## 2023-05-04 DIAGNOSIS — Z90722 Acquired absence of ovaries, bilateral: Secondary | ICD-10-CM | POA: Diagnosis not present

## 2023-05-04 DIAGNOSIS — C482 Malignant neoplasm of peritoneum, unspecified: Secondary | ICD-10-CM

## 2023-05-04 LAB — COMPREHENSIVE METABOLIC PANEL
ALT: 13 U/L (ref 0–44)
AST: 19 U/L (ref 15–41)
Albumin: 4.5 g/dL (ref 3.5–5.0)
Alkaline Phosphatase: 42 U/L (ref 38–126)
Anion gap: 9 (ref 5–15)
BUN: 19 mg/dL (ref 8–23)
CO2: 28 mmol/L (ref 22–32)
Calcium: 9.4 mg/dL (ref 8.9–10.3)
Chloride: 101 mmol/L (ref 98–111)
Creatinine, Ser: 0.79 mg/dL (ref 0.44–1.00)
GFR, Estimated: >60 mL/min (ref >60)
Glucose, Bld: 219 mg/dL — ABNORMAL HIGH (ref 70–99)
Potassium: 4.3 mmol/L (ref 3.5–5.1)
Sodium: 138 mmol/L (ref 135–145)
Total Bilirubin: 0.5 mg/dL (ref 0.0–1.2)
Total Protein: 7 g/dL (ref 6.5–8.1)

## 2023-05-04 LAB — CBC WITH DIFFERENTIAL/PLATELET
Abs Immature Granulocytes: 0.01 10*3/uL (ref 0.00–0.07)
Basophils Absolute: 0 10*3/uL (ref 0.0–0.1)
Basophils Relative: 0 %
Eosinophils Absolute: 0 10*3/uL (ref 0.0–0.5)
Eosinophils Relative: 0 %
HCT: 38.8 % (ref 36.0–46.0)
Hemoglobin: 13.1 g/dL (ref 12.0–15.0)
Immature Granulocytes: 0 %
Lymphocytes Relative: 11 %
Lymphs Abs: 0.6 10*3/uL — ABNORMAL LOW (ref 0.7–4.0)
MCH: 30 pg (ref 26.0–34.0)
MCHC: 33.8 g/dL (ref 30.0–36.0)
MCV: 88.8 fL (ref 80.0–100.0)
Monocytes Absolute: 0.1 10*3/uL (ref 0.1–1.0)
Monocytes Relative: 2 %
Neutro Abs: 4.9 10*3/uL (ref 1.7–7.7)
Neutrophils Relative %: 87 %
Platelets: 192 10*3/uL (ref 150–400)
RBC: 4.37 MIL/uL (ref 3.87–5.11)
RDW: 13.1 % (ref 11.5–15.5)
WBC: 5.6 10*3/uL (ref 4.0–10.5)
nRBC: 0 % (ref 0.0–0.2)

## 2023-05-04 MED ORDER — SODIUM CHLORIDE 0.9 % IV SOLN
175.0000 mg/m2 | Freq: Once | INTRAVENOUS | Status: AC
  Administered 2023-05-04: 264 mg via INTRAVENOUS
  Filled 2023-05-04: qty 44

## 2023-05-04 MED ORDER — FOSAPREPITANT DIMEGLUMINE INJECTION 150 MG
150.0000 mg | Freq: Once | INTRAVENOUS | Status: AC
  Administered 2023-05-04: 150 mg via INTRAVENOUS
  Filled 2023-05-04: qty 150

## 2023-05-04 MED ORDER — FAMOTIDINE IN NACL 20-0.9 MG/50ML-% IV SOLN
20.0000 mg | Freq: Once | INTRAVENOUS | Status: AC
  Administered 2023-05-04: 20 mg via INTRAVENOUS
  Filled 2023-05-04: qty 50

## 2023-05-04 MED ORDER — CETIRIZINE HCL 10 MG/ML IV SOLN
10.0000 mg | Freq: Once | INTRAVENOUS | Status: AC
  Administered 2023-05-04: 10 mg via INTRAVENOUS
  Filled 2023-05-04: qty 1

## 2023-05-04 MED ORDER — DEXAMETHASONE SODIUM PHOSPHATE 10 MG/ML IJ SOLN
10.0000 mg | Freq: Once | INTRAMUSCULAR | Status: AC
  Administered 2023-05-04: 10 mg via INTRAVENOUS
  Filled 2023-05-04: qty 1

## 2023-05-04 MED ORDER — SODIUM CHLORIDE 0.9 % IV SOLN: Freq: Once | INTRAVENOUS | Status: AC

## 2023-05-04 MED ORDER — PALONOSETRON HCL INJECTION 0.25 MG/5ML
0.2500 mg | Freq: Once | INTRAVENOUS | Status: AC
  Administered 2023-05-04: 0.25 mg via INTRAVENOUS
  Filled 2023-05-04: qty 5

## 2023-05-04 MED ORDER — SODIUM CHLORIDE 0.9 % IV SOLN
399.0000 mg | Freq: Once | INTRAVENOUS | Status: AC
  Administered 2023-05-04: 400 mg via INTRAVENOUS
  Filled 2023-05-04: qty 40

## 2023-05-04 NOTE — Patient Instructions (Signed)
 [facility] - A DEPT OF [facility]  Discharge Instructions: Thank you for choosing [facility] to provide your oncology and hematology care.   If you have a lab appointment with the Cancer Center, please go directly to the Cancer Center and check in at the registration area.   Wear comfortable clothing and clothing appropriate for easy access to any Portacath or PICC line.   We strive to give you quality time with your provider. You may need to reschedule your appointment if you arrive late (15 or more minutes).  Arriving late affects you and other patients whose appointments are after yours.  Also, if you miss three or more appointments without notifying the office, you may be dismissed from the clinic at the provider's discretion.      For prescription refill requests, have your pharmacy contact our office and allow 72 hours for refills to be completed.    Today you received the following chemotherapy and/or immunotherapy agents: Paclitaxel, Carboplatin      To help prevent nausea and vomiting after your treatment, we encourage you to take your nausea medication as directed.  BELOW ARE SYMPTOMS THAT SHOULD BE REPORTED IMMEDIATELY: *FEVER GREATER THAN 100.4 F (38 C) OR HIGHER *CHILLS OR SWEATING *NAUSEA AND VOMITING THAT IS NOT CONTROLLED WITH YOUR NAUSEA MEDICATION *UNUSUAL SHORTNESS OF BREATH *UNUSUAL BRUISING OR BLEEDING *URINARY PROBLEMS (pain or burning when urinating, or frequent urination) *BOWEL PROBLEMS (unusual diarrhea, constipation, pain near the anus) TENDERNESS IN MOUTH AND THROAT WITH OR WITHOUT PRESENCE OF ULCERS (sore throat, sores in mouth, or a toothache) UNUSUAL RASH, SWELLING OR PAIN  UNUSUAL VAGINAL DISCHARGE OR ITCHING   Items with * indicate a potential emergency and should be followed up as soon as possible or go to the Emergency Department if any problems should occur.  Please show the CHEMOTHERAPY ALERT CARD or  IMMUNOTHERAPY ALERT CARD at check-in to the Emergency Department and triage nurse.  Should you have questions after your visit or need to cancel or reschedule your appointment, please contact [facility] - A DEPT OF [facility]  Dept: [phone]  and follow the prompts.  Office hours are 8:00 a.m. to 4:30 p.m. Monday - Friday. Please note that voicemails left after 4:00 p.m. may not be returned until the following business day.  We are closed weekends and major holidays. You have access to a nurse at all times for urgent questions. Please call the main number to the clinic Dept: [phone] and follow the prompts.   For any non-urgent questions, you may also contact your provider using MyChart. We now offer e-Visits for anyone 62 and older to request care online for non-urgent symptoms. For details visit [email or web address].   Also download the MyChart app! Go to the app store, search "MyChart", open the app, select , and log in with your MyChart username and password.  Paclitaxel Injection What is this medication? PACLITAXEL (PAK li TAX el) treats some types of cancer. It works by slowing down the growth of cancer cells. This medicine may be used for other purposes; ask your health care provider or pharmacist if you have questions. COMMON BRAND NAME(S): Onxol, Taxol What should I tell my care team before I take this medication? They need to know if you have any of these conditions: Heart disease Liver disease Low white blood cell levels An unusual or allergic reaction to paclitaxel, other medications,  foods, dyes, or preservatives If you or your partner are pregnant or trying to get pregnant Breast-feeding How should I use this medication? This medication is injected into a vein. It is given by your care team in a hospital or clinic setting. Talk to your care team about the use of this medication in children. While it may be given to children  for selected conditions, precautions do apply. Overdosage: If you think you have taken too much of this medicine contact a poison control center or emergency room at once. NOTE: This medicine is only for you. Do not share this medicine with others. What if I miss a dose? Keep appointments for follow-up doses. It is important not to miss your dose. Call your care team if you are unable to keep an appointment. What may interact with this medication? Do not take this medication with any of the following: Live virus vaccines Other medications may affect the way this medication works. Talk with your care team about all of the medications you take. They may suggest changes to your treatment plan to lower the risk of side effects and to make sure your medications work as intended. This list may not describe all possible interactions. Give your health care provider a list of all the medicines, herbs, non-prescription drugs, or dietary supplements you use. Also tell them if you smoke, drink alcohol, or use illegal drugs. Some items may interact with your medicine. What should I watch for while using this medication? Your condition will be monitored carefully while you are receiving this medication. You may need blood work while taking this medication. This medication may make you feel generally unwell. This is not uncommon as chemotherapy can affect healthy cells as well as cancer cells. Report any side effects. Continue your course of treatment even though you feel ill unless your care team tells you to stop. This medication can cause serious allergic reactions. To reduce the risk, your care team may give you other medications to take before receiving this one. Be sure to follow the directions from your care team. This medication may increase your risk of getting an infection. Call your care team for advice if you get a fever, chills, sore throat, or other symptoms of a cold or flu. Do not treat yourself. Try  to avoid being around people who are sick. This medication may increase your risk to bruise or bleed. Call your care team if you notice any unusual bleeding. Be careful brushing or flossing your teeth or using a toothpick because you may get an infection or bleed more easily. If you have any dental work done, tell your dentist you are receiving this medication. Talk to your care team if you may be pregnant. Serious birth defects can occur if you take this medication during pregnancy. Talk to your care team before breastfeeding. Changes to your treatment plan may be needed. What side effects may I notice from receiving this medication? Side effects that you should report to your care team as soon as possible: Allergic reactions--skin rash, itching, hives, swelling of the face, lips, tongue, or throat Heart rhythm changes--fast or irregular heartbeat, dizziness, feeling faint or lightheaded, chest pain, trouble breathing Increase in blood pressure Infection--fever, chills, cough, sore throat, wounds that don't heal, pain or trouble when passing urine, general feeling of discomfort or being unwell Low blood pressure--dizziness, feeling faint or lightheaded, blurry vision Low red blood cell level--unusual weakness or fatigue, dizziness, headache, trouble breathing Painful swelling, warmth, or redness  of the skin, blisters or sores at the infusion site Pain, tingling, or numbness in the hands or feet Slow heartbeat--dizziness, feeling faint or lightheaded, confusion, trouble breathing, unusual weakness or fatigue Unusual bruising or bleeding Side effects that usually do not require medical attention (report to your care team if they continue or are bothersome): Diarrhea Hair loss Joint pain Loss of appetite Muscle pain Nausea Vomiting This list may not describe all possible side effects. Call your doctor for medical advice about side effects. You may report side effects to FDA at  1-800-FDA-1088. Where should I keep my medication? This medication is given in a hospital or clinic. It will not be stored at home. NOTE: This sheet is a summary. It may not cover all possible information. If you have questions about this medicine, talk to your doctor, pharmacist, or health care provider.  2024 Elsevier/Gold Standard (2021-08-30 00:00:00)  Carboplatin Injection What is this medication? CARBOPLATIN (KAR boe pla tin) treats some types of cancer. It works by slowing down the growth of cancer cells. This medicine may be used for other purposes; ask your health care provider or pharmacist if you have questions. COMMON BRAND NAME(S): Paraplatin What should I tell my care team before I take this medication? They need to know if you have any of these conditions: Blood disorders Hearing problems Kidney disease Recent or ongoing radiation therapy An unusual or allergic reaction to carboplatin, cisplatin, other medications, foods, dyes, or preservatives Pregnant or trying to get pregnant Breast-feeding How should I use this medication? This medication is injected into a vein. It is given by your care team in a hospital or clinic setting. Talk to your care team about the use of this medication in children. Special care may be needed. Overdosage: If you think you have taken too much of this medicine contact a poison control center or emergency room at once. NOTE: This medicine is only for you. Do not share this medicine with others. What if I miss a dose? Keep appointments for follow-up doses. It is important not to miss your dose. Call your care team if you are unable to keep an appointment. What may interact with this medication? Medications for seizures Some antibiotics, such as amikacin, gentamicin, neomycin, streptomycin, tobramycin Vaccines This list may not describe all possible interactions. Give your health care provider a list of all the medicines, herbs,  non-prescription drugs, or dietary supplements you use. Also tell them if you smoke, drink alcohol, or use illegal drugs. Some items may interact with your medicine. What should I watch for while using this medication? Your condition will be monitored carefully while you are receiving this medication. You may need blood work while taking this medication. This medication may make you feel generally unwell. This is not uncommon, as chemotherapy can affect healthy cells as well as cancer cells. Report any side effects. Continue your course of treatment even though you feel ill unless your care team tells you to stop. In some cases, you may be given additional medications to help with side effects. Follow all directions for their use. This medication may increase your risk of getting an infection. Call your care team for advice if you get a fever, chills, sore throat, or other symptoms of a cold or flu. Do not treat yourself. Try to avoid being around people who are sick. Avoid taking medications that contain aspirin, acetaminophen, ibuprofen, naproxen, or ketoprofen unless instructed by your care team. These medications may hide a fever. Be careful  brushing or flossing your teeth or using a toothpick because you may get an infection or bleed more easily. If you have any dental work done, tell your dentist you are receiving this medication. Talk to your care team if you wish to become pregnant or think you might be pregnant. This medication can cause serious birth defects. Talk to your care team about effective forms of contraception. Do not breast-feed while taking this medication. What side effects may I notice from receiving this medication? Side effects that you should report to your care team as soon as possible: Allergic reactions--skin rash, itching, hives, swelling of the face, lips, tongue, or throat Infection--fever, chills, cough, sore throat, wounds that don't heal, pain or trouble when passing  urine, general feeling of discomfort or being unwell Low red blood cell level--unusual weakness or fatigue, dizziness, headache, trouble breathing Pain, tingling, or numbness in the hands or feet, muscle weakness, change in vision, confusion or trouble speaking, loss of balance or coordination, trouble walking, seizures Unusual bruising or bleeding Side effects that usually do not require medical attention (report to your care team if they continue or are bothersome): Hair loss Nausea Unusual weakness or fatigue Vomiting This list may not describe all possible side effects. Call your doctor for medical advice about side effects. You may report side effects to FDA at 1-800-FDA-1088. Where should I keep my medication? This medication is given in a hospital or clinic. It will not be stored at home. NOTE: This sheet is a summary. It may not cover all possible information. If you have questions about this medicine, talk to your doctor, pharmacist, or health care provider.  2024 Elsevier/Gold Standard (2021-08-02 00:00:00)

## 2023-05-06 ENCOUNTER — Encounter: Payer: Self-pay | Admitting: Hematology and Oncology

## 2023-05-06 DIAGNOSIS — G620 Drug-induced polyneuropathy: Secondary | ICD-10-CM | POA: Insufficient documentation

## 2023-05-06 LAB — CA 125: Cancer Antigen (CA) 125: 9.8 U/mL (ref 0.0–38.1)

## 2023-05-06 NOTE — Assessment & Plan Note (Signed)
she has mild peripheral neuropathy, likely related to side effects of treatment. It is only mild, not bothering the patient. I will observe for now 

## 2023-05-06 NOTE — Progress Notes (Signed)
 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 It is only mild, not bothering the patient. I will observe for now  Orders Placed This Encounter  Procedures   CT ABDOMEN PELVIS W CONTRAST    Standing Status:   Future    Expected Date:   06/04/2023    Expiration Date:   05/03/2024    Scheduling Instructions:     No oral contrast    If indicated for the ordered procedure, I authorize the administration of contrast media per Radiology protocol:   Yes    Does the patient have a contrast media/X-ray dye allergy?:   No    Preferred imaging location?:   [facility]    If indicated for the ordered procedure, I authorize the administration of oral contrast media per Radiology protocol:   Yes    All questions were answered. The patient knows to call the clinic with any problems, questions or concerns. The total time spent in the appointment was 30 minutes encounter with patients including review of chart and various tests results, discussions about plan of care and coordination of care plan   Almarie Bedford, MD 05/06/2023 12:39 PM  INTERVAL HISTORY: Please see below for problem oriented charting. she returns for final dose of chemotherapy She tolerated recent treatment well except for very mild neuropathy affecting her left toe We discussed risk and  benefits of niraparib  versus bevacizumab for future maintenance treatment She is undecided  REVIEW OF SYSTEMS:   Constitutional: Denies fevers, chills or abnormal weight loss Eyes: Denies blurriness of vision Ears, nose, mouth, throat, and face: Denies mucositis or sore throat Respiratory: Denies cough, dyspnea or wheezes Cardiovascular: Denies palpitation, chest discomfort or lower extremity swelling Gastrointestinal:  Denies nausea, heartburn or change in bowel habits Skin: Denies abnormal skin rashes Lymphatics: Denies new lymphadenopathy or easy bruising Behavioral/Psych: Mood is stable, no new changes  All other systems were reviewed with the patient and are negative.  I have reviewed the past medical history, past surgical history, social history and family history with the patient and they are unchanged from previous note.  ALLERGIES:  is allergic to artichoke [cynara scolymus (artichoke)].  MEDICATIONS:  Current Outpatient Medications  Medication Sig Dispense Refill   conjugated estrogens  (PREMARIN ) vaginal cream Place 1 applicatorful vaginally 3 (three) times a week. 42.5 g 1   dexamethasone  (DECADRON ) 4 MG tablet Take 2 tabs at the night before and 2 tab the morning of chemotherapy, every 3 weeks, by mouth x 6 cycles 24 tablet 6   ondansetron  (ZOFRAN ) 4 MG tablet Take 1-2 tablets (4-8 mg total) by mouth every 8 (eight) hours as needed for nausea or vomiting. 10 tablet 0   prochlorperazine  (COMPAZINE ) 10 MG tablet Take 1 tablet (10 mg total) by mouth every 6 (six) hours as needed for nausea or vomiting. 30 tablet 1   risedronate  (ACTONEL ) 150 MG tablet Take 150 mg by mouth every 30 (  thirty) days. with water  on empty stomach, nothing by mouth or lie down for next 30 minutes.     tretinoin (RETIN-A) 0.025 % cream Apply 1 application  topically at bedtime.     No current facility-administered medications for this visit.    SUMMARY OF ONCOLOGIC HISTORY: Oncology History Overview  Note  Neg genetics,  Molecular testing through [facility] in 2024:  HER2/neu 0, negative F0 LR 2+, 40%, negative PD-L1 CPS score 5% ER +75% HRD negative   Primary peritoneal carcinomatosis (HCC)  08/21/2020 Initial Diagnosis   The patient reports intermittent right upper quadrant pain that feel like she pulled a muscle beginning in March 2022.  In April, she woke up with sharp pain in her right upper quadrant.  This was her first episode of sharp pain.  She thought that her pain was related to her gallbladder and presented to the emergency department.  She was seen in ED on 4/30. RUQ ultrasound and x-ray were normal as were LFTs and lipase.    10/27/2020 Imaging   Findings highly suspicious for peritoneal carcinomatosis. No site of primary malignancy identified.   Colonic diverticulosis, without radiographic evidence of diverticulitis   11/02/2020 Initial Diagnosis   Primary peritoneal carcinomatosis (HCC)   11/02/2020 Tumor Marker   Patient's tumor was tested for the following markers: CA-125. Results of the tumor marker test revealed 88.   11/11/2020 Pathology Results   FINAL MICROSCOPIC DIAGNOSIS:   A. CUL DE SAC, LEFT ANTERIOR, EXCISION:  -  Poorly differentiated carcinoma  -  See comment   B. SIDEWALL, RIGHT, BIOPSY:  -  Poorly differentiated carcinoma  -  See comment   COMMENT:   By immunohistochemistry, the neoplastic cells are positive for cytokeratin 7, p53, PAX8 and WT1 but negative for cytokeratin 20, GATA3, CDX2 and TTF-1.  Overall, the immunophenotype is consistent with a  gynecologic primary.    11/11/2020 Pathology Results   FINAL MICROSCOPIC DIAGNOSIS:  - Malignant cells consistent with metastatic adenocarcinoma    11/11/2020 Surgery   Pre-operative Diagnosis: Carcinomatosis, mildly elevated CA-125   Post-operative Diagnosis: same, At least stage IIIC gyn malignancy   Operation: Diagnostic laparoscopy, peritoneal biopsies    Surgeon: Viktoria Crank  MD Operative Findings: On EUA, small mobile uterus. On intra-abdominal entry, carcinomatosis appreciated studding the right anterior diaphragm, bilateral liver surfaces, mesentery, pelvic peritoneum. Omental cake with measuring up to 2x3cm. Right ovary adherent to the right uterus with peritoneal studding adjacent. Frondular implants noted along right pelvic sidewall. Miliary disease along anterior cul de sac. Cul de sac covered with friable miliary disease. Small volume dark amber ascites. Specimens: left anterior cul de sac peritoneum, right pelvic sidewall peritoneal biopsy          11/15/2020 Cancer Staging   Staging form: Ovary, Fallopian Tube, and Primary Peritoneal Carcinoma, AJCC 8th Edition - Clinical stage from 11/15/2020: FIGO Stage III (cT3, cN0, cM0) - Signed by Lonn Hicks, MD on 11/15/2020 Stage prefix: Initial diagnosis   11/26/2020 - 05/13/2021 Chemotherapy   Patient is on Treatment Plan : OVARIAN Carboplatin  (AUC 6) / Paclitaxel  (175) q21d x 6 cycles     11/26/2020 Tumor Marker   Patient's tumor was tested for the following markers: CA-125. Results of the tumor marker test revealed 76.8.   12/17/2020 Tumor Marker   Patient's tumor was tested for the following markers: CA-125. Results of the tumor marker test revealed 58.1.   01/07/2021 Tumor Marker   Patient's tumor was tested for the following markers: CA-125.  Results of the tumor marker test revealed 21.6.   01/20/2021 Imaging   Decreased omental soft tissue nodularity and caking, consistent with interval improvement in carcinoma.   No evidence of new or progressive disease within the abdomen or pelvis.   Colonic diverticulosis. No radiographic evidence of diverticulitis.   Large stool burden noted; recommend clinical correlation for possible constipation   02/24/2021 Surgery   Robotic-assisted laparoscopic total hysterectomy with bilateral salpingoophorectomy, peritoneal stripping, mini-lap for omentectomy and  intra-abdominal palpation  Findings: On EUA, small mobile uterus. Rectum free. On intra-abdominal entry, normal upper abdominal survey including liver edge, diaphragm and stomach. Omentum with several small (<2cm) areas of thickening, suspected treated tumor versus residual tumor. Uterus 6cm and normal in appearance. Atrophic appearing bilateral adnexa. Some adhesions of the left ovary to the medial leaf of the broad ligament. Minimal peritoneal nodularity versus treated disease within posterior cul de sac and deep left pelvis, all either excised or ablated. No significant peritoneal disease or carcinomatosis. No sigmoid or rectal involvement. Small bowel normal in appearance. No adenopathy. No ascites. R0 resection.    02/24/2021 Pathology Results   A. PERTIONEAL SCAR, EXCISION:  - Microscopic focus of high-grade serous carcinoma   B. 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 OMENTUM, RESECTION:  - Microscopic foci of high-grade serous carcinoma   03/14/2021 Tumor Marker   Patient's tumor was tested for the following markers: CA-125. Results of the tumor marker test revealed 32.7.   03/23/2021 Imaging   Unremarkable right upper quadrant ultrasound   04/01/2021 Genetic Testing   HRD status through [employer] was negative.  The report date is 04/01/2021. Negative hereditary cancer genetic testing: no pathogenic variants detected in [employer].  The report date is 04/04/2021.  The [facility] gene panel offered by [employer] includes sequencing and deletion/duplication testing of the following 48 genes: APC, ATM, AXIN2, BAP1, BARD1, BMPR1A, BRCA1, BRCA2, BRIP1, CHD1, CDK4, CDKN2A(p16 and p14ARF), CHEK2, CTNNA1, EGFR, EPCAM, FH, FLCN, GREM1, HOXB13,  MEN1, MET, MITF , MLH1, MSH2, MSH3, MSH6, MUTYH, NTHL1, PALB2, PMS2, POLD1, POLE, PTEN, RAD51C, RAD51D, RET, SDHA, SDHB, SDHC, SDHD, SMAD4, STK11,TERT, TP53, TSC1, TSC2, and VHL.   04/12/2021 Tumor Marker   Patient's tumor was tested for the following markers: CA-125. Results of the tumor marker test revealed 12.7.   06/13/2021 Imaging   1. No new mass or lymphadenopathy identified in the abdomen or pelvis. Interval resolution of previous omental soft tissue caking. 2. Other ancillary findings.   06/13/2021 Tumor Marker   Patient's tumor was tested for the following markers: CA-125. Results of the tumor marker test revealed 10.1.   07/11/2021 Tumor Marker   Patient's tumor was tested for the following markers: CA-125. Results of the tumor marker test revealed 9.4.   09/08/2021 Imaging   1. Status post hysterectomy and omentectomy. 2. No evidence of lymphadenopathy or metastatic disease in the abdomen or pelvis.     09/08/2021 Tumor Marker   Patient's tumor was tested for the following markers: CA-125. Results of the tumor marker test revealed 9.0.   02/22/2022 Imaging   IMPRESSION: 1. No acute findings.  No ascites, mass or adenopathy. 2.  Aortic Atherosclerosis (ICD10-170.0).   03/10/2022 Tumor Marker   Patient's tumor was tested for the following markers:  CA-125. Results of the tumor marker test revealed 11.7.   06/09/2022 Tumor Marker   Patient's tumor was tested for the following markers: CA-125. Results of the tumor marker test revealed 29.2.   09/07/2022 Tumor Marker   Patient's tumor was tested for the following markers: CA-125. Results of the tumor marker test revealed 39.7.   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 RADIATION DOSE REDUCTION: This exam was performed according to the departmental dose-optimization program which includes automated exposure control, adjustment of the mA and/or kV according to patient size and/or use of iterative reconstruction technique. CONTRAST:  60mL OMNIPAQUE  IOHEXOL  350 MG/ML SOLN COMPARISON:  06/22/2022 and previous FINDINGS: Lower chest: No pleural or pericardial effusion. Visualized lung bases grossly clear, with some motion degradation. Hepatobiliary: No focal liver abnormality is seen. No gallstones, gallbladder wall thickening, or biliary dilatation. Pancreas: Unremarkable. No pancreatic ductal dilatation or surrounding inflammatory changes. Spleen: Normal in size without focal abnormality. Accessory splenule. Adrenals/Urinary Tract: No adrenal mass. Symmetric renal parenchymal enhancement without urolithiasis or hydronephrosis. Urinary bladder incompletely distended. Stomach/Bowel: Stomach incompletely distended, unremarkable. Small bowel decompressed. Post appendectomy. Gas and fecal material partially distend the colon, without acute finding. A few scattered diverticula in the sigmoid segment. Vascular/Lymphatic: Minimal calcified aortic plaque without aneurysm or stenosis. Portal vein patent. A few scattered mesenteric lymph nodes none greater than 4 mm short axis diameter. No abdominal or pelvic adenopathy. Reproductive: Status post hysterectomy. No adnexal masses. No peritoneal nodularity. Other: No ascites. No free air. Bilateral pelvic phleboliths stable. Musculoskeletal: Stable lower lumbar spondylitic change with early grade and grade 1 anterolisthesis L4-5 and degenerative disc disease L5-S1. No fracture or worrisome bone lesion. IMPRESSION: 1. No acute findings. 2. No evidence of recurrent or metastatic disease. 3. Sigmoid diverticulosis. Electronically Signed   By: JONETTA Faes M.D.   On: 09/15/2022 20:09      11/24/2022 Tumor Marker    Patient's tumor was tested for the following markers: CA-125. Results of the tumor marker test revealed 47.6.   12/04/2022 Imaging   CT A/P 1. A new 2.4 x 2.1 cm low attenuating structure in the right pelvis abutting the vaginal cuff concerning for recurrent disease, given rise in CA 125. Clinical correlation is recommended. 2. No bowel obstruction. Appendectomy. 3.  Aortic Atherosclerosis (ICD10-I70.0).   12/20/2022 Surgery   Robotic-assisted excision of pelvic mass requiring colpotomy with vaginal cuff closure Carletta), excision of left para-colic gutter implant (White), low anterior resection with reanastomosis Isabel), cystoscopy Carletta)   Findings: On EUA, 2-3 cm mass is palpable in the vaginal apex, and close proximity to the rectum but without direct involvement.  Intra-abdominal entry, normal-appearing diaphragm, liver edge, and stomach.  No peritoneal disease or implants noted.  Normal-appearing small bowel.  Surgically absent uterus, cervix, fallopian tubes and ovaries.  Small, 5 mm cystic nodule noted along the left paracolic gutter lateral to the sigmoid colon (excised).  Within the pelvis, there was a 3 cm cystic mass, apparently bilobed, adherent to the posterior aspect of the vaginal cuff in the midline into the right.  This was close to but not directly involving the rectum nor much of the rectal mesentery.  There was minimal spillage from the cystic lesion during manipulation to excise the  specimen.  Upon resection of this area, the more superior aspect of the rectum was identified as having a similar appearing 1-1.5 cm cystic mass involving the rectum and adjacent mesentery.  The entire lesion was removed within the segment of rectosigmoid colon.  No additional implants or findings concerning for metastatic disease were noted in the abdomen or pelvis. R0 resection. On cystoscopy, bladder dome intact. Good efflux noted from bilateral ureteral orifices.   12/20/2022 Pathology Results    A. PELVIC MASS, EXCISION:  - High-grade serous carcinoma, involving vaginal mucosa and adjacent  soft tissue   B. LEFT PERICOLIC GUTTER NODULE, EXCISION:  - High-grade serous carcinoma  - Resection margin is negative for carcinoma   C. RECTOSIGMOID COLON, RESECTION:  - High-grade serous carcinoma involving segment of rectosigmoid colon  - Lymph node, negative for carcinoma (0/1)  - Resection margins, negative for carcinoma   D. FINAL DISTAL MARGIN OF ANASTOMOTIC RING, EXCISION:  - Colonic donut, negative for carcinoma   Cytology FINAL MICROSCOPIC DIAGNOSIS:  - Malignant cells present  - See comment     01/19/2023 -  Chemotherapy   Patient is on Treatment Plan : OVARIAN Carboplatin  (AUC 6) + Paclitaxel  (175) q21d X 6 Cycles     01/19/2023 Tumor Marker   Patient's tumor was tested for the following markers: CA-125. Results of the tumor marker test revealed 11.2.   02/12/2023 Tumor Marker   Patient's tumor was tested for the following markers: CA-125. Results of the tumor marker test revealed 9.9.   03/05/2023 Tumor Marker   Patient's tumor was tested for the following markers: CA-125. Results of the tumor marker test revealed 11.7.   03/21/2023 Tumor Marker   Patient's tumor was tested for the following markers: CA-125. Results of the tumor marker test revealed 10.2.   04/16/2023 Tumor Marker   Patient's tumor was tested for the following markers: CA-125. Results of the tumor marker test revealed 10.4.     PHYSICAL EXAMINATION: ECOG PERFORMANCE STATUS: 1 - Symptomatic but completely ambulatory  Vitals:   05/04/23 0806  BP: 137/71  Pulse: 86  Resp: 18  Temp: 98.3 F (36.8 C)  SpO2: 97%   Filed Weights   05/04/23 0806  Weight: 118 lb 6.4 oz (53.7 kg)    GENERAL:alert, no distress and comfortable  LABORATORY DATA:  I have reviewed the data as listed    Component Value Date/Time   NA 138 05/04/2023 0726   K 4.3 05/04/2023 0726   CL 101 05/04/2023 0726    CO2 28 05/04/2023 0726   GLUCOSE 219 (H) 05/04/2023 0726   BUN 19 05/04/2023 0726   CREATININE 0.79 05/04/2023 0726   CREATININE 0.76 04/13/2023 0828   CALCIUM 9.4 05/04/2023 0726   PROT 7.0 05/04/2023 0726   ALBUMIN  4.5 05/04/2023 0726   AST 19 05/04/2023 0726   AST 17 04/13/2023 0828   ALT 13 05/04/2023 0726   ALT 12 04/13/2023 0828   ALKPHOS 42 05/04/2023 0726   BILITOT 0.5 05/04/2023 0726   BILITOT 0.4 04/13/2023 0828   GFRNONAA >60 05/04/2023 0726   GFRNONAA >60 04/13/2023 0828   GFRAA  02/27/2007 1510    >60        The eGFR has been calculated using the MDRD equation. This calculation has not been validated in all clinical    No results found for: SPEP, UPEP  Lab Results  Component Value Date   WBC 5.6 05/04/2023   NEUTROABS 4.9 05/04/2023   HGB 13.1 05/04/2023  HCT 38.8 05/04/2023   MCV 88.8 05/04/2023   PLT 192 05/04/2023      Chemistry      Component Value Date/Time   NA 138 05/04/2023 0726   K 4.3 05/04/2023 0726   CL 101 05/04/2023 0726   CO2 28 05/04/2023 0726   BUN 19 05/04/2023 0726   CREATININE 0.79 05/04/2023 0726   CREATININE 0.76 04/13/2023 0828      Component Value Date/Time   CALCIUM 9.4 05/04/2023 0726   ALKPHOS 42 05/04/2023 0726   AST 19 05/04/2023 0726   AST 17 04/13/2023 0828   ALT 13 05/04/2023 0726   ALT 12 04/13/2023 0828   BILITOT 0.5 05/04/2023 0726   BILITOT 0.4 04/13/2023 9171

## 2023-05-06 NOTE — Assessment & Plan Note (Signed)
 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

## 2023-06-01 ENCOUNTER — Telehealth: Payer: Self-pay

## 2023-06-01 NOTE — Telephone Encounter (Signed)
 Robin Sellers called requesting a phone visit with Dr.Tucker. Reports finishing chemo on 1/10. She has a CT scan scheduled for 2/10 and an appointment with Dr.Gorsuch on 2/18. Pt states Dr.Gorsuch mentioned medication option treatments. The names of the medications are Niraparib  (Zejula ) or Avastin (Bevacizumab)  She would like to discuss with Dr.Tucker to get her opinion.  Pt is scheduled for a phone visit on 2/13 @ 6:00PM. She agrees to date and time and is aware Dr.Tucker may call her at anytime that day.   Dr.Tucker notified.

## 2023-06-04 ENCOUNTER — Ambulatory Visit (HOSPITAL_COMMUNITY)
Admission: RE | Admit: 2023-06-04 | Discharge: 2023-06-04 | Disposition: A | Discharge status: Home / Self Care | Payer: Medicare Other | Source: Ambulatory Visit | Attending: Hematology and Oncology | Admitting: Hematology and Oncology

## 2023-06-04 ENCOUNTER — Inpatient Hospital Stay: Payer: Medicare Other | Attending: Hematology and Oncology

## 2023-06-04 DIAGNOSIS — C563 Malignant neoplasm of bilateral ovaries: Secondary | ICD-10-CM | POA: Insufficient documentation

## 2023-06-04 DIAGNOSIS — Z79899 Other long term (current) drug therapy: Secondary | ICD-10-CM | POA: Insufficient documentation

## 2023-06-04 DIAGNOSIS — C786 Secondary malignant neoplasm of retroperitoneum and peritoneum: Secondary | ICD-10-CM | POA: Insufficient documentation

## 2023-06-04 DIAGNOSIS — C482 Malignant neoplasm of peritoneum, unspecified: Secondary | ICD-10-CM | POA: Insufficient documentation

## 2023-06-04 DIAGNOSIS — D696 Thrombocytopenia, unspecified: Secondary | ICD-10-CM | POA: Diagnosis not present

## 2023-06-04 LAB — CBC WITH DIFFERENTIAL/PLATELET
Abs Immature Granulocytes: 0.02 10*3/uL (ref 0.00–0.07)
Basophils Absolute: 0 10*3/uL (ref 0.0–0.1)
Basophils Relative: 1 %
Eosinophils Absolute: 0.1 10*3/uL (ref 0.0–0.5)
Eosinophils Relative: 1 %
HCT: 39.4 % (ref 36.0–46.0)
Hemoglobin: 13.1 g/dL (ref 12.0–15.0)
Immature Granulocytes: 0 %
Lymphocytes Relative: 26 %
Lymphs Abs: 1.3 10*3/uL (ref 0.7–4.0)
MCH: 30.8 pg (ref 26.0–34.0)
MCHC: 33.2 g/dL (ref 30.0–36.0)
MCV: 92.5 fL (ref 80.0–100.0)
Monocytes Absolute: 0.3 10*3/uL (ref 0.1–1.0)
Monocytes Relative: 7 %
Neutro Abs: 3.1 10*3/uL (ref 1.7–7.7)
Neutrophils Relative %: 65 %
Platelets: 142 10*3/uL — ABNORMAL LOW (ref 150–400)
RBC: 4.26 MIL/uL (ref 3.87–5.11)
RDW: 13.2 % (ref 11.5–15.5)
WBC: 4.9 10*3/uL (ref 4.0–10.5)
nRBC: 0 % (ref 0.0–0.2)

## 2023-06-04 LAB — COMPREHENSIVE METABOLIC PANEL
ALT: 13 U/L (ref 0–44)
AST: 18 U/L (ref 15–41)
Albumin: 4.5 g/dL (ref 3.5–5.0)
Alkaline Phosphatase: 40 U/L (ref 38–126)
Anion gap: 5 (ref 5–15)
BUN: 22 mg/dL (ref 8–23)
CO2: 31 mmol/L (ref 22–32)
Calcium: 9.4 mg/dL (ref 8.9–10.3)
Chloride: 105 mmol/L (ref 98–111)
Creatinine, Ser: 0.88 mg/dL (ref 0.44–1.00)
GFR, Estimated: >60 mL/min (ref >60)
Glucose, Bld: 107 mg/dL — ABNORMAL HIGH (ref 70–99)
Potassium: 4.6 mmol/L (ref 3.5–5.1)
Sodium: 141 mmol/L (ref 135–145)
Total Bilirubin: 0.5 mg/dL (ref 0.0–1.2)
Total Protein: 6.9 g/dL (ref 6.5–8.1)

## 2023-06-04 MED ORDER — IOHEXOL 300 MG/ML  SOLN
100.0000 mL | Freq: Once | INTRAMUSCULAR | Status: AC | PRN
  Administered 2023-06-04: 100 mL via INTRAVENOUS

## 2023-06-05 LAB — CA 125: Cancer Antigen (CA) 125: 9.5 U/mL (ref 0.0–38.1)

## 2023-06-07 ENCOUNTER — Telehealth: Payer: Self-pay

## 2023-06-07 ENCOUNTER — Telehealth: Payer: Medicare Other | Admitting: Gynecologic Oncology

## 2023-06-07 NOTE — Telephone Encounter (Signed)
She called and left a message that she is having vision issues and pressure of the side of her head. She was able to get appt this afternoon with ophthalmologist. She will keep Dr. Bertis Ruddy updated.

## 2023-06-08 ENCOUNTER — Encounter: Payer: Self-pay | Admitting: Hematology and Oncology

## 2023-06-08 ENCOUNTER — Inpatient Hospital Stay (HOSPITAL_BASED_OUTPATIENT_CLINIC_OR_DEPARTMENT_OTHER): Payer: Medicare Other | Admitting: Gynecologic Oncology

## 2023-06-08 ENCOUNTER — Encounter: Payer: Self-pay | Admitting: Gynecologic Oncology

## 2023-06-08 DIAGNOSIS — Z7189 Other specified counseling: Secondary | ICD-10-CM | POA: Diagnosis not present

## 2023-06-08 DIAGNOSIS — C482 Malignant neoplasm of peritoneum, unspecified: Secondary | ICD-10-CM | POA: Diagnosis not present

## 2023-06-08 NOTE — Progress Notes (Signed)
Gynecologic Oncology Telehealth Note: Gyn-Onc  I connected with Robin Sellers on 06/08/23 at  6:00 PM EST by telephone and verified that I am speaking with the correct person using two identifiers.  I discussed the limitations, risks, security and privacy concerns of performing an evaluation and management service by telemedicine and the availability of in-person appointments. I also discussed with the patient that there may be a patient responsible charge related to this service. The patient expressed understanding and agreed to proceed.  Other persons participating in the visit and their role in the encounter: none.  Patient's location: home Provider's location: [facility]  Reason for Visit: follow-up  Treatment History: Oncology History Overview Note  Neg genetics,  Molecular testing through [facility] in 2024:  HER2/neu 0, negative F0 LR 2+, 40%, negative PD-L1 CPS score 5% ER +75% HRD negative   Primary peritoneal carcinomatosis (HCC)  08/21/2020 Initial Diagnosis   The patient reports intermittent right upper quadrant pain that feel like she pulled a muscle beginning in March 2022.  In April, she woke up with sharp pain in her right upper quadrant.  This was her first episode of sharp pain.  She thought that her pain was related to her gallbladder and presented to the emergency department.  She was seen in ED on 4/30. RUQ ultrasound and x-ray were normal as were LFTs and lipase.    10/27/2020 Imaging   Findings highly suspicious for peritoneal carcinomatosis. No site of primary malignancy identified.   Colonic diverticulosis, without radiographic evidence of diverticulitis   11/02/2020 Initial Diagnosis   Primary peritoneal carcinomatosis (HCC)   11/02/2020 Tumor Marker   Patient's tumor was tested for the following markers: CA-125. Results of the tumor marker test revealed 88.   11/11/2020 Pathology Results   FINAL MICROSCOPIC DIAGNOSIS:   A. CUL DE SAC, LEFT ANTERIOR,  EXCISION:  -  Poorly differentiated carcinoma  -  See comment   B. SIDEWALL, RIGHT, BIOPSY:  -  Poorly differentiated carcinoma  -  See comment   COMMENT:   By immunohistochemistry, the neoplastic cells are positive for cytokeratin 7, p53, PAX8 and WT1 but negative for cytokeratin 20, GATA3, CDX2 and TTF-1.  Overall, the immunophenotype is consistent with a  gynecologic primary.    11/11/2020 Pathology Results   FINAL MICROSCOPIC DIAGNOSIS:  - Malignant cells consistent with metastatic adenocarcinoma    11/11/2020 Surgery   Pre-operative Diagnosis: Carcinomatosis, mildly elevated CA-125   Post-operative Diagnosis: same, At least stage IIIC gyn malignancy   Operation: Diagnostic laparoscopy, peritoneal biopsies    Surgeon: Eugene Garnet MD Operative Findings: On EUA, small mobile uterus. On intra-abdominal entry, carcinomatosis appreciated studding the right anterior diaphragm, bilateral liver surfaces, mesentery, pelvic peritoneum. Omental cake with measuring up to 2x3cm. Right ovary adherent to the right uterus with peritoneal studding adjacent. Frondular implants noted along right pelvic sidewall. Miliary disease along anterior cul de sac. Cul de sac covered with friable miliary disease. Small volume dark amber ascites. Specimens: left anterior cul de sac peritoneum, right pelvic sidewall peritoneal biopsy          11/15/2020 Cancer Staging   Staging form: Ovary, Fallopian Tube, and Primary Peritoneal Carcinoma, AJCC 8th Edition - Clinical stage from 11/15/2020: FIGO Stage III (cT3, cN0, cM0) - Signed by Artis Delay, MD on 11/15/2020 Stage prefix: Initial diagnosis   11/26/2020 - 05/13/2021 Chemotherapy   Patient is on Treatment Plan : OVARIAN Carboplatin (AUC 6) / Paclitaxel (175) q21d x 6 cycles  11/26/2020 Tumor Marker   Patient's tumor was tested for the following markers: CA-125. Results of the tumor marker test revealed 76.8.   12/17/2020 Tumor Marker   Patient's tumor  was tested for the following markers: CA-125. Results of the tumor marker test revealed 58.1.   01/07/2021 Tumor Marker   Patient's tumor was tested for the following markers: CA-125. Results of the tumor marker test revealed 21.6.   01/20/2021 Imaging   Decreased omental soft tissue nodularity and caking, consistent with interval improvement in carcinoma.   No evidence of new or progressive disease within the abdomen or pelvis.   Colonic diverticulosis. No radiographic evidence of diverticulitis.   Large stool burden noted; recommend clinical correlation for possible constipation   02/24/2021 Surgery   Robotic-assisted laparoscopic total hysterectomy with bilateral salpingoophorectomy, peritoneal stripping, mini-lap for omentectomy and intra-abdominal palpation  Findings: On EUA, small mobile uterus. Rectum free. On intra-abdominal entry, normal upper abdominal survey including liver edge, diaphragm and stomach. Omentum with several small (<2cm) areas of thickening, suspected treated tumor versus residual tumor. Uterus 6cm and normal in appearance. Atrophic appearing bilateral adnexa. Some adhesions of the left ovary to the medial leaf of the broad ligament. Minimal peritoneal nodularity versus treated disease within posterior cul de sac and deep left pelvis, all either excised or ablated. No significant peritoneal disease or carcinomatosis. No sigmoid or rectal involvement. Small bowel normal in appearance. No adenopathy. No ascites. R0 resection.    02/24/2021 Pathology Results   A. PERTIONEAL SCAR, EXCISION:  - Microscopic focus of high-grade serous carcinoma   B. 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 OMENTUM, RESECTION:  - Microscopic foci of high-grade  serous carcinoma   03/14/2021 Tumor Marker   Patient's tumor was tested for the following markers: CA-125. Results of the tumor marker test revealed 32.7.   03/23/2021 Imaging   Unremarkable right upper quadrant ultrasound   04/01/2021 Genetic Testing   HRD status through [facility] was negative.  The report date is 04/01/2021. Negative hereditary cancer genetic testing: no pathogenic variants detected in [facility].  The report date is 04/04/2021.  The [facility] gene panel offered by [employer] includes sequencing and deletion/duplication testing of the following 48 genes: APC, ATM, AXIN2, BAP1, BARD1, BMPR1A, BRCA1, BRCA2, BRIP1, CHD1, CDK4, CDKN2A(p16 and p14ARF), CHEK2, CTNNA1, EGFR, EPCAM, FH, FLCN, GREM1, HOXB13, MEN1, MET, MITF , MLH1, MSH2, MSH3, MSH6, MUTYH, NTHL1, PALB2, PMS2, POLD1, POLE, PTEN, RAD51C, RAD51D, RET, SDHA, SDHB, SDHC, SDHD, SMAD4, STK11,TERT, TP53, TSC1, TSC2, and VHL.   04/12/2021 Tumor Marker   Patient's tumor was tested for the following markers: CA-125. Results of the tumor marker test revealed 12.7.   06/13/2021 Imaging   1. No new mass or lymphadenopathy identified in the abdomen or pelvis. Interval resolution of previous omental soft tissue caking. 2. Other ancillary findings.   06/13/2021 Tumor Marker   Patient's tumor was tested for the following markers: CA-125. Results of the tumor marker test revealed 10.1.   07/11/2021 Tumor Marker   Patient's tumor was tested for the following markers: CA-125. Results of the tumor marker test revealed 9.4.   09/08/2021 Imaging   1. Status post hysterectomy and omentectomy. 2. No evidence of lymphadenopathy or metastatic disease in the abdomen or pelvis.  09/08/2021 Tumor Marker   Patient's tumor was tested for the following markers: CA-125. Results of the tumor marker test revealed 9.0.   02/22/2022 Imaging   IMPRESSION: 1. No acute findings.  No ascites, mass or adenopathy. 2.  Aortic  Atherosclerosis (ICD10-170.0).   03/10/2022 Tumor Marker   Patient's tumor was tested for the following markers: CA-125. Results of the tumor marker test revealed 11.7.   06/09/2022 Tumor Marker   Patient's tumor was tested for the following markers: CA-125. Results of the tumor marker test revealed 29.2.   09/07/2022 Tumor Marker   Patient's tumor was tested for the following markers: CA-125. Results of the tumor marker test revealed 39.7.   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 RADIATION DOSE REDUCTION: This exam was performed according to the departmental dose-optimization program which includes automated exposure control, adjustment of the mA and/or kV according to patient size and/or use of iterative reconstruction technique. CONTRAST:  60mL OMNIPAQUE IOHEXOL 350 MG/ML SOLN COMPARISON:  06/22/2022 and previous FINDINGS: Lower chest: No pleural or pericardial effusion. Visualized lung bases grossly clear, with some motion degradation. Hepatobiliary: No focal liver abnormality is seen. No gallstones, gallbladder wall thickening, or biliary dilatation. Pancreas: Unremarkable. No pancreatic ductal dilatation or surrounding inflammatory changes. Spleen: Normal in size without focal abnormality. Accessory splenule. Adrenals/Urinary Tract: No adrenal mass. Symmetric renal parenchymal enhancement without urolithiasis or hydronephrosis. Urinary bladder incompletely distended. Stomach/Bowel: Stomach incompletely distended, unremarkable. Small bowel decompressed. Post appendectomy. Gas and fecal material partially distend the colon, without acute finding. A few scattered diverticula in the sigmoid segment.  Vascular/Lymphatic: Minimal calcified aortic plaque without aneurysm or stenosis. Portal vein patent. A few scattered mesenteric lymph nodes none greater than 4 mm short axis diameter. No abdominal or pelvic adenopathy. Reproductive: Status post hysterectomy. No adnexal masses. No peritoneal nodularity. Other: No ascites. No free air. Bilateral pelvic phleboliths stable. Musculoskeletal: Stable lower lumbar spondylitic change with early grade and grade 1 anterolisthesis L4-5 and degenerative disc disease L5-S1. No fracture or worrisome bone lesion. IMPRESSION: 1. No acute findings. 2. No evidence of recurrent or metastatic disease. 3. Sigmoid diverticulosis. Electronically Signed   By: Corlis Leak M.D.   On: 09/15/2022 20:09      11/24/2022 Tumor Marker   Patient's tumor was tested for the following markers: CA-125. Results of the tumor marker test revealed 47.6.   12/04/2022 Imaging   CT A/P 1. A new 2.4 x 2.1 cm low attenuating structure in the right pelvis abutting the vaginal cuff concerning for recurrent disease, given rise in CA 125. Clinical correlation is recommended. 2. No bowel obstruction. Appendectomy. 3.  Aortic Atherosclerosis (ICD10-I70.0).   12/20/2022 Surgery   Robotic-assisted excision of pelvic mass requiring colpotomy with vaginal cuff closure [name]), excision of left para-colic gutter implant (White), low anterior resection with reanastomosis [name]), cystoscopy [name])   Findings: On EUA, 2-3 cm mass is palpable in the vaginal apex, and close proximity to the rectum but without direct involvement.  Intra-abdominal entry, normal-appearing diaphragm, liver edge, and stomach.  No peritoneal disease or implants noted.  Normal-appearing small bowel.  Surgically absent uterus, cervix, fallopian tubes and ovaries.  Small, 5 mm cystic nodule noted along the left paracolic gutter lateral to the sigmoid colon (excised).  Within the pelvis, there was a 3 cm cystic mass, apparently bilobed,  adherent to the posterior aspect of the vaginal cuff in the midline into the right.  This was close to but not directly involving the rectum nor much of the rectal mesentery.  There was minimal spillage from the cystic lesion during manipulation to excise the specimen.  Upon resection of this area, the more superior aspect of the rectum was identified as having a similar appearing 1-1.5 cm cystic mass involving the rectum and adjacent mesentery.  The entire lesion was removed within the segment of rectosigmoid colon.  No additional implants or findings concerning for metastatic disease were noted in the abdomen or pelvis. R0 resection. On cystoscopy, bladder dome intact. Good efflux noted from bilateral ureteral orifices.   12/20/2022 Pathology Results   A. PELVIC MASS, EXCISION:  - High-grade serous carcinoma, involving vaginal mucosa and adjacent  soft tissue   B. LEFT PERICOLIC GUTTER NODULE, EXCISION:  - High-grade serous carcinoma  - Resection margin is negative for carcinoma   C. RECTOSIGMOID COLON, RESECTION:  - High-grade serous carcinoma involving segment of rectosigmoid colon  - Lymph node, negative for carcinoma (0/1)  - Resection margins, negative for carcinoma   D. FINAL DISTAL MARGIN OF ANASTOMOTIC RING, EXCISION:  - Colonic donut, negative for carcinoma   Cytology FINAL MICROSCOPIC DIAGNOSIS:  - Malignant cells present  - See comment     01/19/2023 -  Chemotherapy   Patient is on Treatment Plan : OVARIAN Carboplatin (AUC 6) + Paclitaxel (175) q21d X 6 Cycles     01/19/2023 Tumor Marker   Patient's tumor was tested for the following markers: CA-125. Results of the tumor marker test revealed 11.2.   02/12/2023 Tumor Marker   Patient's tumor was tested for the following markers: CA-125. Results of the tumor marker test revealed 9.9.   03/05/2023 Tumor Marker   Patient's tumor was tested for the following markers: CA-125. Results of the tumor marker test revealed  11.7.   03/21/2023 Tumor Marker   Patient's tumor was tested for the following markers: CA-125. Results of the tumor marker test revealed 10.2.   04/16/2023 Tumor Marker   Patient's tumor was tested for the following markers: CA-125. Results of the tumor marker test revealed 10.4.   06/04/2023 Imaging   CT ABDOMEN PELVIS W CONTRAST Result Date: 06/05/2023 CLINICAL DATA:  History of ovarian carcinoma, assess treatment response EXAM: CT ABDOMEN AND PELVIS WITH CONTRAST TECHNIQUE: Multidetector CT imaging of the abdomen and pelvis was performed using the standard protocol following bolus administration of intravenous contrast. RADIATION DOSE REDUCTION: This exam was performed according to the departmental dose-optimization program which includes automated exposure control, adjustment of the mA and/or kV according to patient size and/or use of iterative reconstruction technique. CONTRAST:  OMNIPAQUE IOHEXOL 300 MG/ML  SOLN COMPARISON:  12/04/2022 and previous FINDINGS: Lower chest: No pleural or pericardial effusion. Visualized lung bases clear. Hepatobiliary: No focal liver abnormality is seen. No gallstones, gallbladder wall thickening, or biliary dilatation. Pancreas: Unremarkable. No pancreatic ductal dilatation or surrounding inflammatory changes. Spleen: Normal in size without focal abnormality. Small accessory splenule, stable. Adrenals/Urinary Tract: No adrenal mass. Symmetric renal parenchymal enhancement without focal lesion, hydronephrosis, or evident urolithiasis. Urinary bladder nondistended. Stomach/Bowel: Stomach incompletely distended, unremarkable. Small bowel nondistended. Post appendectomy. The colon is partially distended. Anastomotic staple line at the rectosigmoid junction. Vascular/Lymphatic: Minimal calcified aortic plaque without aneurysm. Patent portal and renal veins. No abdominal or pelvic adenopathy. Reproductive: Status post  hysterectomy. No adnexal masses. The cystic  process previously described in the region of the vaginal cuff on the right is no longer visualized. Other: Left pelvic phlebolith.  No ascites.  No free air. Musculoskeletal: Mild spondylitic changes throughout the lumbar spine. Grade 1 anterolisthesis L4-5 attributed to bilateral facet DJD. IMPRESSION: 1. No acute findings. 2. Interval resolution of the cystic process at the vaginal cuff. 3. No evidence of recurrent or metastatic disease. Electronically Signed   By: Corlis Leak M.D.   On: 06/05/2023 13:20      06/05/2023 Tumor Marker   Patient's tumor was tested for the following markers: CA-125. Results of the tumor marker test revealed 9.5.     Interval History: Doing well, completed chemotherapy.  Past Medical/Surgical History: Past Medical History:  Diagnosis Date   Anemia    Cancer (HCC)    ovarian   Family history of breast cancer 03/14/2021   Hole, retinal    Right eye   Osteoporosis    Polyp of colon, unspecified part of colon, unspecified type    Benign    Past Surgical History:  Procedure Laterality Date   APPENDECTOMY  02/27/2007   lsc   BILATERAL SALPINGECTOMY Right 03/1978   right ectopic, tied other tube   BOWEL RESECTION N/A 12/20/2022   Procedure: RECTOSIGMOID RESECTION AND REANASTAMOSIS, MINI LAPAROTOMY;  Surgeon: [name], MD;  Location: [facility];  Service: Gynecology;  Laterality: N/A;   CATARACT EXTRACTION Right 10/31/2011   Dr. Dione Booze   COLONOSCOPY  03/2019   Dr. Levora Angel   COLONOSCOPY WITH PROPOFOL N/A 04/01/2018   Procedure: COLONOSCOPY WITH PROPOFOL;  Surgeon: Kathi Der, MD;  Location: [facility];  Service: Gastroenterology;  Laterality: N/A;   CYSTOSCOPY  12/20/2022   Procedure: CYSTOSCOPY;  Surgeon: [name], MD;  Location: [facility];  Service: Gynecology;;   DILATION AND CURETTAGE OF UTERUS     x3, 2 for miscarriages, one for polyp vs fibroid   HAND / FINGER LESION EXCISION Left    lt. index finger   LAPAROSCOPIC  HYSTERECTOMY     LAPAROSCOPY N/A 11/11/2020   Procedure: LAPAROSCOPY DIAGNOSTIC WITH PERITONEAL BIOPSY;  Surgeon: [name], MD;  Location: [facility];  Service: Gynecology;  Laterality: N/A;   POLYPECTOMY  04/01/2018   Procedure: POLYPECTOMY;  Surgeon: Kathi Der, MD;  Location: [facility];  Service: Gastroenterology;;   RETINAL TEAR REPAIR CRYOTHERAPY Right 04/25/2011   Dr. Luciana Axe   ROBOT ASSISTED MYOMECTOMY N/A 12/20/2022   Procedure: DIAGNOSTIC LAPAROSCOPY, XI ROBOTIC ASSISTED PELVIC MASS EXCISION;  Surgeon: [name], MD;  Location: [facility];  Service: Gynecology;  Laterality: N/A;   SUBMUCOSAL INJECTION  04/01/2018   Procedure: SUBMUCOSAL INJECTION;  Surgeon: Kathi Der, MD;  Location: [facility];  Service: Gastroenterology;;   TUBAL LIGATION Left 03/1978    Family History  Problem Relation Age of Onset   Lung cancer Mother 70       smoking hx   Skin cancer Father        dx after 50; non-melanoma; scalp   Lung cancer Maternal Aunt        dx after 50; smoking hx   Breast cancer Daughter 40       ER+   Skin cancer Cousin        face; surgery only   Ovarian cancer Neg Hx    Endometrial cancer Neg Hx    Prostate cancer Neg Hx    Pancreatic cancer Neg Hx  Colon cancer Neg Hx     Social History   Socioeconomic History   Marital status: Legally Separated    Spouse name: Not on file   Number of children: Not on file   Years of education: Not on file   Highest education level: Not on file  Occupational History   Not on file  Tobacco Use   Smoking status: Never    Passive exposure: Past (18 years)   Smokeless tobacco: Never  Vaping Use   Vaping status: Never Used  Substance and Sexual Activity   Alcohol use: Not Currently   Drug use: Never   Sexual activity: Not Currently  Other Topics Concern   Not on file  Social History Narrative   Not on file   Social Drivers of Health   Financial Resource Strain: Not on file  Food  Insecurity: No Food Insecurity (12/21/2022)   Hunger Vital Sign    Worried About Running Out of Food in the Last Year: Never true    Ran Out of Food in the Last Year: Never true  Transportation Needs: No Transportation Needs (12/21/2022)   PRAPARE - Administrator, Civil Service (Medical): No    Lack of Transportation (Non-Medical): No  Physical Activity: Not on file  Stress: Not on file  Social Connections: Not on file    Current Medications:  Current Outpatient Medications:    conjugated estrogens (PREMARIN) vaginal cream, Place 1 applicatorful vaginally 3 (three) times a week., Disp: 42.5 g, Rfl: 1   dexamethasone (DECADRON) 4 MG tablet, Take 2 tabs at the night before and 2 tab the morning of chemotherapy, every 3 weeks, by mouth x 6 cycles, Disp: 24 tablet, Rfl: 6   ondansetron (ZOFRAN) 4 MG tablet, Take 1-2 tablets (4-8 mg total) by mouth every 8 (eight) hours as needed for nausea or vomiting., Disp: 10 tablet, Rfl: 0   prochlorperazine (COMPAZINE) 10 MG tablet, Take 1 tablet (10 mg total) by mouth every 6 (six) hours as needed for nausea or vomiting., Disp: 30 tablet, Rfl: 1   risedronate (ACTONEL) 150 MG tablet, Take 150 mg by mouth every 30 (thirty) days. with water on empty stomach, nothing by mouth or lie down for next 30 minutes., Disp: , Rfl:    tretinoin (RETIN-A) 0.025 % cream, Apply 1 application  topically at bedtime., Disp: , Rfl:   Review of Symptoms: Pertinent positives as per HPI.  Physical Exam: Deferred given limitations of phone visit.  Laboratory & Radiologic Studies: CT A/P on /10/24: 1. No acute findings. 2. Interval resolution of the cystic process at the vaginal cuff. 3. No evidence of recurrent or metastatic disease.  Assessment & Plan: Robin Sellers is a 73 y.o. 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 R0 debulking. [facility]: ER+, HRD neg, PDL1 CPS 5, P53 abn. ERBB2  negative (I[facility] 0). FOLR1 40% (negative). Recurrence noted in the pelvis, now status post secondary debulking surgery with resection of 3 separate lesions, completely excised.  Patient has done very well with chemotherapy.  She was interested in discussing some of her decisions coming up about maintenance treatment.  We discussed options of no maintenance, a Avastin, and PARP inhibitor.  We discussed FDA approval changes for PARP inhibitors in the recurrent setting over the last couple of years based on long-term follow-up data.  Reviewed some of the more common and serious side effects of PARP inhibitors.  Ultimately, I told the patient I  would reach out to Dr. Bertis Ruddy.  She meets with her on Tuesday to make a decision about how to proceed.  I discussed the assessment and treatment plan with the patient. The patient was provided with an opportunity to ask questions and all were answered. The patient agreed with the plan and demonstrated an understanding of the instructions.   The patient was advised to call back or see an in-person evaluation if the symptoms worsen or if the condition fails to improve as anticipated.   18 minutes of total time was spent for this patient encounter, including preparation, phone counseling with the patient and coordination of care, and documentation of the encounter.   Eugene Garnet, MD  Division of Gynecologic Oncology  Department of Obstetrics and Gynecology  [facility]

## 2023-06-12 ENCOUNTER — Telehealth: Payer: Self-pay | Admitting: Pharmacy Technician

## 2023-06-12 ENCOUNTER — Other Ambulatory Visit (HOSPITAL_COMMUNITY): Payer: Self-pay

## 2023-06-12 ENCOUNTER — Encounter: Payer: Self-pay | Admitting: Hematology and Oncology

## 2023-06-12 ENCOUNTER — Inpatient Hospital Stay (HOSPITAL_BASED_OUTPATIENT_CLINIC_OR_DEPARTMENT_OTHER): Payer: Medicare Other | Admitting: Hematology and Oncology

## 2023-06-12 VITALS — BP 135/57 | HR 78 | Temp 98.0°F | Resp 18 | Ht 68.0 in | Wt 120.2 lb

## 2023-06-12 DIAGNOSIS — D696 Thrombocytopenia, unspecified: Secondary | ICD-10-CM | POA: Insufficient documentation

## 2023-06-12 DIAGNOSIS — C482 Malignant neoplasm of peritoneum, unspecified: Secondary | ICD-10-CM

## 2023-06-12 DIAGNOSIS — C786 Secondary malignant neoplasm of retroperitoneum and peritoneum: Secondary | ICD-10-CM | POA: Diagnosis not present

## 2023-06-12 MED ORDER — NIRAPARIB TOSYLATE 200 MG PO TABS
200.0000 mg | ORAL_TABLET | Freq: Every day | ORAL | 11 refills | Status: DC
  Filled 2023-06-19: qty 30, 30d supply, fill #0
  Filled 2023-07-20: qty 30, 30d supply, fill #1

## 2023-06-12 NOTE — Progress Notes (Signed)
[facility] OFFICE PROGRESS NOTE  Patient Care Team: Burton Apley, MD as PCP - General (Internal Medicine) Paulina Fusi Servando Snare, RN as Oncology Nurse Navigator (Oncology)  ASSESSMENT & PLAN:  Primary peritoneal carcinomatosis [facility]) I have reviewed multiple imaging studies with the patient and her husband She have no signs of residual disease We discussed risk and benefits of maintenance treatment with niraparib and she agreed to proceed I will start her on moderate dose at 200 mg daily We have agree start date on March 1 I will see her on March 8 for toxicity review  Thrombocytopenia (HCC) Likely due to recent treatment She is not symptomatic As above, I will start her on slightly reduced dose of niraparib  No orders of the defined types were placed in this encounter.   All questions were answered. The patient knows to call the clinic with any problems, questions or concerns. The total time spent in the appointment was 40 minutes encounter with patients including review of chart and various tests results, discussions about plan of care and coordination of care plan   Artis Delay, MD 06/12/2023 12:51 PM  INTERVAL HISTORY: Please see below for problem oriented charting. she returns for surveillance follow-up and review of recent imaging studies She have no residual side effects from recent chemo I reviewed multiple imaging studies with the patient We discussed risk and benefits of niraparib, as maintenance treatment due to recent recurrent cancer  REVIEW OF SYSTEMS:   Constitutional: Denies fevers, chills or abnormal weight loss Eyes: Denies blurriness of vision Ears, nose, mouth, throat, and face: Denies mucositis or sore throat Respiratory: Denies cough, dyspnea or wheezes Cardiovascular: Denies palpitation, chest discomfort or lower extremity swelling Gastrointestinal:  Denies nausea, heartburn or change in bowel habits Skin: Denies abnormal skin  rashes Lymphatics: Denies new lymphadenopathy or easy bruising Neurological:Denies numbness, tingling or new weaknesses Behavioral/Psych: Mood is stable, no new changes  All other systems were reviewed with the patient and are negative.  I have reviewed the past medical history, past surgical history, social history and family history with the patient and they are unchanged from previous note.  ALLERGIES:  is allergic to artichoke [cynara scolymus (artichoke)].  MEDICATIONS:  Current Outpatient Medications  Medication Sig Dispense Refill   niraparib tosylate (ZEJULA) 200 MG tablet Take 1 tablet (200 mg total) by mouth daily. May take at bedtime to reduce nausea and vomiting. 30 tablet 11   conjugated estrogens (PREMARIN) vaginal cream Place 1 applicatorful vaginally 3 (three) times a week. 42.5 g 1   dexamethasone (DECADRON) 4 MG tablet Take 2 tabs at the night before and 2 tab the morning of chemotherapy, every 3 weeks, by mouth x 6 cycles 24 tablet 6   ondansetron (ZOFRAN) 4 MG tablet Take 1-2 tablets (4-8 mg total) by mouth every 8 (eight) hours as needed for nausea or vomiting. 10 tablet 0   prochlorperazine (COMPAZINE) 10 MG tablet Take 1 tablet (10 mg total) by mouth every 6 (six) hours as needed for nausea or vomiting. 30 tablet 1   risedronate (ACTONEL) 150 MG tablet Take 150 mg by mouth every 30 (thirty) days. with water on empty stomach, nothing by mouth or lie down for next 30 minutes.     tretinoin (RETIN-A) 0.025 % cream Apply 1 application  topically at bedtime.     No current facility-administered medications for this visit.    SUMMARY OF ONCOLOGIC HISTORY: Oncology History Overview Note  Neg genetics,  Molecular testing through  [facility] in 2024:  HER2/neu 0, negative F0 LR 2+, 40%, negative PD-L1 CPS score 5% ER +75% HRD negative   Primary peritoneal carcinomatosis (HCC)  08/21/2020 Initial Diagnosis   The patient reports intermittent right upper quadrant pain that  feel like she pulled a muscle beginning in March 2022.  In April, she woke up with sharp pain in her right upper quadrant.  This was her first episode of sharp pain.  She thought that her pain was related to her gallbladder and presented to the emergency department.  She was seen in ED on 4/30. RUQ ultrasound and x-ray were normal as were LFTs and lipase.    10/27/2020 Imaging   Findings highly suspicious for peritoneal carcinomatosis. No site of primary malignancy identified.   Colonic diverticulosis, without radiographic evidence of diverticulitis   11/02/2020 Initial Diagnosis   Primary peritoneal carcinomatosis (HCC)   11/02/2020 Tumor Marker   Patient's tumor was tested for the following markers: CA-125. Results of the tumor marker test revealed 88.   11/11/2020 Pathology Results   FINAL MICROSCOPIC DIAGNOSIS:   A. CUL DE SAC, LEFT ANTERIOR, EXCISION:  -  Poorly differentiated carcinoma  -  See comment   B. SIDEWALL, RIGHT, BIOPSY:  -  Poorly differentiated carcinoma  -  See comment   COMMENT:   By immunohistochemistry, the neoplastic cells are positive for cytokeratin 7, p53, PAX8 and WT1 but negative for cytokeratin 20, GATA3, CDX2 and TTF-1.  Overall, the immunophenotype is consistent with a  gynecologic primary.    11/11/2020 Pathology Results   FINAL MICROSCOPIC DIAGNOSIS:  - Malignant cells consistent with metastatic adenocarcinoma    11/11/2020 Surgery   Pre-operative Diagnosis: Carcinomatosis, mildly elevated CA-125   Post-operative Diagnosis: same, At least stage IIIC gyn malignancy   Operation: Diagnostic laparoscopy, peritoneal biopsies    Surgeon: Eugene Garnet MD Operative Findings: On EUA, small mobile uterus. On intra-abdominal entry, carcinomatosis appreciated studding the right anterior diaphragm, bilateral liver surfaces, mesentery, pelvic peritoneum. Omental cake with measuring up to 2x3cm. Right ovary adherent to the right uterus with peritoneal  studding adjacent. Frondular implants noted along right pelvic sidewall. Miliary disease along anterior cul de sac. Cul de sac covered with friable miliary disease. Small volume dark amber ascites. Specimens: left anterior cul de sac peritoneum, right pelvic sidewall peritoneal biopsy          11/15/2020 Cancer Staging   Staging form: Ovary, Fallopian Tube, and Primary Peritoneal Carcinoma, AJCC 8th Edition - Clinical stage from 11/15/2020: FIGO Stage III (cT3, cN0, cM0) - Signed by Artis Delay, MD on 11/15/2020 Stage prefix: Initial diagnosis   11/26/2020 - 05/13/2021 Chemotherapy   Patient is on Treatment Plan : OVARIAN Carboplatin (AUC 6) / Paclitaxel (175) q21d x 6 cycles     11/26/2020 Tumor Marker   Patient's tumor was tested for the following markers: CA-125. Results of the tumor marker test revealed 76.8.   12/17/2020 Tumor Marker   Patient's tumor was tested for the following markers: CA-125. Results of the tumor marker test revealed 58.1.   01/07/2021 Tumor Marker   Patient's tumor was tested for the following markers: CA-125. Results of the tumor marker test revealed 21.6.   01/20/2021 Imaging   Decreased omental soft tissue nodularity and caking, consistent with interval improvement in carcinoma.   No evidence of new or progressive disease within the abdomen or pelvis.   Colonic diverticulosis. No radiographic evidence of diverticulitis.   Large stool burden noted; recommend clinical correlation for possible constipation  02/24/2021 Surgery   Robotic-assisted laparoscopic total hysterectomy with bilateral salpingoophorectomy, peritoneal stripping, mini-lap for omentectomy and intra-abdominal palpation  Findings: On EUA, small mobile uterus. Rectum free. On intra-abdominal entry, normal upper abdominal survey including liver edge, diaphragm and stomach. Omentum with several small (<2cm) areas of thickening, suspected treated tumor versus residual tumor. Uterus 6cm and normal in  appearance. Atrophic appearing bilateral adnexa. Some adhesions of the left ovary to the medial leaf of the broad ligament. Minimal peritoneal nodularity versus treated disease within posterior cul de sac and deep left pelvis, all either excised or ablated. No significant peritoneal disease or carcinomatosis. No sigmoid or rectal involvement. Small bowel normal in appearance. No adenopathy. No ascites. R0 resection.    02/24/2021 Pathology Results   A. PERTIONEAL SCAR, EXCISION:  - Microscopic focus of high-grade serous carcinoma   B. 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 OMENTUM, RESECTION:  - Microscopic foci of high-grade serous carcinoma   03/14/2021 Tumor Marker   Patient's tumor was tested for the following markers: CA-125. Results of the tumor marker test revealed 32.7.   03/23/2021 Imaging   Unremarkable right upper quadrant ultrasound   04/01/2021 Genetic Testing   HRD status through [facility] was negative.  The report date is 04/01/2021. Negative hereditary cancer genetic testing: no pathogenic variants detected in [facility].  The report date is 04/04/2021.  The [employer] gene panel offered by [employer] includes sequencing and deletion/duplication testing of the following 48 genes: APC, ATM, AXIN2, BAP1, BARD1, BMPR1A, BRCA1, BRCA2, BRIP1, CHD1, CDK4, CDKN2A(p16 and p14ARF), CHEK2, CTNNA1, EGFR, EPCAM, FH, FLCN, GREM1, HOXB13, MEN1, MET, MITF , MLH1, MSH2, MSH3, MSH6, MUTYH, NTHL1, PALB2, PMS2, POLD1, POLE, PTEN, RAD51C, RAD51D, RET, SDHA, SDHB, SDHC, SDHD, SMAD4, STK11,TERT, TP53, TSC1, TSC2, and VHL.   04/12/2021 Tumor Marker   Patient's tumor was tested for the following markers: CA-125. Results of the tumor marker test  revealed 12.7.   06/13/2021 Imaging   1. No new mass or lymphadenopathy identified in the abdomen or pelvis. Interval resolution of previous omental soft tissue caking. 2. Other ancillary findings.   06/13/2021 Tumor Marker   Patient's tumor was tested for the following markers: CA-125. Results of the tumor marker test revealed 10.1.   07/11/2021 Tumor Marker   Patient's tumor was tested for the following markers: CA-125. Results of the tumor marker test revealed 9.4.   09/08/2021 Imaging   1. Status post hysterectomy and omentectomy. 2. No evidence of lymphadenopathy or metastatic disease in the abdomen or pelvis.     09/08/2021 Tumor Marker   Patient's tumor was tested for the following markers: CA-125. Results of the tumor marker test revealed 9.0.   02/22/2022 Imaging   IMPRESSION: 1. No acute findings.  No ascites, mass or adenopathy. 2.  Aortic Atherosclerosis (ICD10-170.0).   03/10/2022 Tumor Marker   Patient's tumor was tested for the following markers: CA-125. Results of the tumor marker test revealed 11.7.   06/09/2022 Tumor Marker   Patient's tumor was tested for the following markers: CA-125. Results of the tumor marker test revealed 29.2.   09/07/2022 Tumor Marker   Patient's tumor was tested for the following markers: CA-125. Results of the tumor marker test revealed 39.7.   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 RADIATION DOSE REDUCTION: This exam was performed according to the departmental dose-optimization program which includes automated exposure control, adjustment of the mA and/or kV according to patient size and/or use of iterative reconstruction technique. CONTRAST:   60mL OMNIPAQUE IOHEXOL 350 MG/ML SOLN COMPARISON:  06/22/2022 and previous FINDINGS: Lower chest: No pleural or pericardial effusion. Visualized lung bases grossly clear, with some motion degradation. Hepatobiliary: No focal liver abnormality is seen. No gallstones, gallbladder wall thickening, or biliary dilatation. Pancreas: Unremarkable. No pancreatic ductal dilatation or surrounding inflammatory changes. Spleen: Normal in size without focal abnormality. Accessory splenule. Adrenals/Urinary Tract: No adrenal mass. Symmetric renal parenchymal enhancement without urolithiasis or hydronephrosis. Urinary bladder incompletely distended. Stomach/Bowel: Stomach incompletely distended, unremarkable. Small bowel decompressed. Post appendectomy. Gas and fecal material partially distend the colon, without acute finding. A few scattered diverticula in the sigmoid segment. Vascular/Lymphatic: Minimal calcified aortic plaque without aneurysm or stenosis. Portal vein patent. A few scattered mesenteric lymph nodes none greater than 4 mm short axis diameter. No abdominal or pelvic adenopathy. Reproductive: Status post hysterectomy. No adnexal masses. No peritoneal nodularity. Other: No ascites. No free air. Bilateral pelvic phleboliths stable. Musculoskeletal: Stable lower lumbar spondylitic change with early grade and grade 1 anterolisthesis L4-5 and degenerative disc disease L5-S1. No fracture or worrisome bone lesion. IMPRESSION: 1. No acute findings. 2. No evidence of recurrent or metastatic disease. 3. Sigmoid diverticulosis. Electronically Signed   By: Corlis Leak M.D.   On: 09/15/2022 20:09      11/24/2022 Tumor Marker   Patient's tumor was tested for the following markers: CA-125. Results of the tumor marker test revealed 47.6.   12/04/2022 Imaging   CT A/P 1. A new 2.4 x 2.1 cm low attenuating structure in the right pelvis abutting the vaginal cuff concerning for recurrent disease, given rise in CA 125. Clinical  correlation is recommended. 2. No bowel obstruction. Appendectomy. 3.  Aortic Atherosclerosis (ICD10-I70.0).   12/20/2022 Surgery   Robotic-assisted excision of pelvic mass requiring colpotomy with vaginal cuff closure [name]), excision of left para-colic gutter implant (White), low anterior resection with reanastomosis [name]), cystoscopy [name])   Findings: On EUA, 2-3 cm mass is palpable in the vaginal apex, and close proximity to the rectum but without direct involvement.  Intra-abdominal entry, normal-appearing diaphragm, liver edge, and stomach.  No peritoneal disease or implants noted.  Normal-appearing small bowel.  Surgically absent uterus, cervix, fallopian tubes and ovaries.  Small, 5 mm cystic nodule noted along the left paracolic gutter lateral to the sigmoid colon (excised).  Within the pelvis, there was a 3 cm cystic mass, apparently bilobed, adherent to the posterior aspect of the vaginal cuff in the midline into the right.  This was close to but not directly involving the rectum nor much of the rectal mesentery.  There was minimal spillage from the cystic lesion during manipulation to excise the specimen.  Upon resection of this area, the more superior aspect of the rectum was identified as having a similar appearing 1-1.5 cm cystic mass involving the rectum and adjacent mesentery.  The entire lesion was removed within the segment of rectosigmoid colon.  No additional implants or findings concerning for metastatic disease were noted in the abdomen or pelvis. R0 resection. On cystoscopy,  bladder dome intact. Good efflux noted from bilateral ureteral orifices.   12/20/2022 Pathology Results   A. PELVIC MASS, EXCISION:  - High-grade serous carcinoma, involving vaginal mucosa and adjacent  soft tissue   B. LEFT PERICOLIC GUTTER NODULE, EXCISION:  - High-grade serous carcinoma  - Resection margin is negative for carcinoma   C. RECTOSIGMOID COLON, RESECTION:  - High-grade serous  carcinoma involving segment of rectosigmoid colon  - Lymph node, negative for carcinoma (0/1)  - Resection margins, negative for carcinoma   D. FINAL DISTAL MARGIN OF ANASTOMOTIC RING, EXCISION:  - Colonic donut, negative for carcinoma   Cytology FINAL MICROSCOPIC DIAGNOSIS:  - Malignant cells present  - See comment     01/19/2023 - 05/04/2023 Chemotherapy   Patient is on Treatment Plan : OVARIAN Carboplatin (AUC 6) + Paclitaxel (175) q21d X 6 Cycles     01/19/2023 Tumor Marker   Patient's tumor was tested for the following markers: CA-125. Results of the tumor marker test revealed 11.2.   02/12/2023 Tumor Marker   Patient's tumor was tested for the following markers: CA-125. Results of the tumor marker test revealed 9.9.   03/05/2023 Tumor Marker   Patient's tumor was tested for the following markers: CA-125. Results of the tumor marker test revealed 11.7.   03/21/2023 Tumor Marker   Patient's tumor was tested for the following markers: CA-125. Results of the tumor marker test revealed 10.2.   04/16/2023 Tumor Marker   Patient's tumor was tested for the following markers: CA-125. Results of the tumor marker test revealed 10.4.   06/04/2023 Imaging   CT ABDOMEN PELVIS W CONTRAST Result Date: 06/05/2023 CLINICAL DATA:  History of ovarian carcinoma, assess treatment response EXAM: CT ABDOMEN AND PELVIS WITH CONTRAST TECHNIQUE: Multidetector CT imaging of the abdomen and pelvis was performed using the standard protocol following bolus administration of intravenous contrast. RADIATION DOSE REDUCTION: This exam was performed according to the departmental dose-optimization program which includes automated exposure control, adjustment of the mA and/or kV according to patient size and/or use of iterative reconstruction technique. CONTRAST:  OMNIPAQUE IOHEXOL 300 MG/ML  SOLN COMPARISON:  12/04/2022 and previous FINDINGS: Lower chest: No pleural or pericardial effusion. Visualized lung  bases clear. Hepatobiliary: No focal liver abnormality is seen. No gallstones, gallbladder wall thickening, or biliary dilatation. Pancreas: Unremarkable. No pancreatic ductal dilatation or surrounding inflammatory changes. Spleen: Normal in size without focal abnormality. Small accessory splenule, stable. Adrenals/Urinary Tract: No adrenal mass. Symmetric renal parenchymal enhancement without focal lesion, hydronephrosis, or evident urolithiasis. Urinary bladder nondistended. Stomach/Bowel: Stomach incompletely distended, unremarkable. Small bowel nondistended. Post appendectomy. The colon is partially distended. Anastomotic staple line at the rectosigmoid junction. Vascular/Lymphatic: Minimal calcified aortic plaque without aneurysm. Patent portal and renal veins. No abdominal or pelvic adenopathy. Reproductive: Status post hysterectomy. No adnexal masses. The cystic process previously described in the region of the vaginal cuff on the right is no longer visualized. Other: Left pelvic phlebolith.  No ascites.  No free air. Musculoskeletal: Mild spondylitic changes throughout the lumbar spine. Grade 1 anterolisthesis L4-5 attributed to bilateral facet DJD. IMPRESSION: 1. No acute findings. 2. Interval resolution of the cystic process at the vaginal cuff. 3. No evidence of recurrent or metastatic disease. Electronically Signed   By: Corlis Leak M.D.   On: 06/05/2023 13:20      06/05/2023 Tumor Marker   Patient's tumor was tested for the following markers: CA-125. Results of the tumor marker test revealed 9.5.     PHYSICAL EXAMINATION:  ECOG PERFORMANCE STATUS: 0 - Asymptomatic  Vitals:   06/12/23 1211  BP: (!) 135/57  Pulse: 78  Resp: 18  Temp: 98 F (36.7 C)  SpO2: 100%   Filed Weights   06/12/23 1211  Weight: 120 lb 3.2 oz (54.5 kg)    GENERAL:alert, no distress and comfortable LABORATORY DATA:  I have reviewed the data as listed    Component Value Date/Time   NA 141 06/04/2023 0753    K 4.6 06/04/2023 0753   CL 105 06/04/2023 0753   CO2 31 06/04/2023 0753   GLUCOSE 107 (H) 06/04/2023 0753   BUN 22 06/04/2023 0753   CREATININE 0.88 06/04/2023 0753   CREATININE 0.76 04/13/2023 0828   CALCIUM 9.4 06/04/2023 0753   PROT 6.9 06/04/2023 0753   ALBUMIN 4.5 06/04/2023 0753   AST 18 06/04/2023 0753   AST 17 04/13/2023 0828   ALT 13 06/04/2023 0753   ALT 12 04/13/2023 0828   ALKPHOS 40 06/04/2023 0753   BILITOT 0.5 06/04/2023 0753   BILITOT 0.4 04/13/2023 0828   GFRNONAA >60 06/04/2023 0753   GFRNONAA >60 04/13/2023 0828   GFRAA  02/27/2007 1510    >60        The eGFR has been calculated using the MDRD equation. This calculation has not been validated in all clinical    No results found for: "SPEP", "UPEP"  Lab Results  Component Value Date   WBC 4.9 06/04/2023   NEUTROABS 3.1 06/04/2023   HGB 13.1 06/04/2023   HCT 39.4 06/04/2023   MCV 92.5 06/04/2023   PLT 142 (L) 06/04/2023      Chemistry      Component Value Date/Time   NA 141 06/04/2023 0753   K 4.6 06/04/2023 0753   CL 105 06/04/2023 0753   CO2 31 06/04/2023 0753   BUN 22 06/04/2023 0753   CREATININE 0.88 06/04/2023 0753   CREATININE 0.76 04/13/2023 0828      Component Value Date/Time   CALCIUM 9.4 06/04/2023 0753   ALKPHOS 40 06/04/2023 0753   AST 18 06/04/2023 0753   AST 17 04/13/2023 0828   ALT 13 06/04/2023 0753   ALT 12 04/13/2023 0828   BILITOT 0.5 06/04/2023 0753   BILITOT 0.4 04/13/2023 0828       RADIOGRAPHIC STUDIES: I have reviewed multiple imaging studies with the patient and her husband I have personally reviewed the radiological images as listed and agreed with the findings in the report. CT ABDOMEN PELVIS W CONTRAST Result Date: 06/05/2023 CLINICAL DATA:  History of ovarian carcinoma, assess treatment response EXAM: CT ABDOMEN AND PELVIS WITH CONTRAST TECHNIQUE: Multidetector CT imaging of the abdomen and pelvis was performed using the standard protocol following bolus  administration of intravenous contrast. RADIATION DOSE REDUCTION: This exam was performed according to the departmental dose-optimization program which includes automated exposure control, adjustment of the mA and/or kV according to patient size and/or use of iterative reconstruction technique. CONTRAST:  OMNIPAQUE IOHEXOL 300 MG/ML  SOLN COMPARISON:  12/04/2022 and previous FINDINGS: Lower chest: No pleural or pericardial effusion. Visualized lung bases clear. Hepatobiliary: No focal liver abnormality is seen. No gallstones, gallbladder wall thickening, or biliary dilatation. Pancreas: Unremarkable. No pancreatic ductal dilatation or surrounding inflammatory changes. Spleen: Normal in size without focal abnormality. Small accessory splenule, stable. Adrenals/Urinary Tract: No adrenal mass. Symmetric renal parenchymal enhancement without focal lesion, hydronephrosis, or evident urolithiasis. Urinary bladder nondistended. Stomach/Bowel: Stomach incompletely distended, unremarkable. Small bowel nondistended. Post appendectomy. The colon is partially  distended. Anastomotic staple line at the rectosigmoid junction. Vascular/Lymphatic: Minimal calcified aortic plaque without aneurysm. Patent portal and renal veins. No abdominal or pelvic adenopathy. Reproductive: Status post hysterectomy. No adnexal masses. The cystic process previously described in the region of the vaginal cuff on the right is no longer visualized. Other: Left pelvic phlebolith.  No ascites.  No free air. Musculoskeletal: Mild spondylitic changes throughout the lumbar spine. Grade 1 anterolisthesis L4-5 attributed to bilateral facet DJD. IMPRESSION: 1. No acute findings. 2. Interval resolution of the cystic process at the vaginal cuff. 3. No evidence of recurrent or metastatic disease. Electronically Signed   By: Corlis Leak M.D.   On: 06/05/2023 13:20

## 2023-06-12 NOTE — Telephone Encounter (Signed)
Oral Oncology Patient Advocate Encounter   Received notification that prior authorization for Zejula is required.   PA submitted on 06/12/23 Key [id number] Status is pending     Jinger Neighbors, CPhT-Adv Oncology Pharmacy Patient Advocate [facility] Direct Number: [phone]  Fax: [phone]

## 2023-06-12 NOTE — Assessment & Plan Note (Signed)
I have reviewed multiple imaging studies with the patient and her husband She have no signs of residual disease We discussed risk and benefits of maintenance treatment with niraparib and she agreed to proceed I will start her on moderate dose at 200 mg daily We have agree start date on March 1 I will see her on March 8 for toxicity review

## 2023-06-12 NOTE — Telephone Encounter (Signed)
Oral Oncology Patient Advocate Encounter  Robin Sellers has been approved.    PA# [id number] Effective dates: 06/12/23 through 04/23/98  Patients co-pay is $2,000.    Jinger Neighbors, CPhT-Adv Oncology Pharmacy Patient Advocate [facility] Direct Number: [phone]  Fax: [phone]

## 2023-06-12 NOTE — Assessment & Plan Note (Signed)
Likely due to recent treatment She is not symptomatic As above, I will start her on slightly reduced dose of niraparib

## 2023-06-19 ENCOUNTER — Other Ambulatory Visit: Payer: Self-pay

## 2023-06-19 ENCOUNTER — Other Ambulatory Visit: Payer: Self-pay | Admitting: Pharmacy Technician

## 2023-06-19 ENCOUNTER — Other Ambulatory Visit (HOSPITAL_COMMUNITY): Payer: Self-pay

## 2023-06-19 ENCOUNTER — Inpatient Hospital Stay: Payer: Medicare Other | Admitting: Pharmacist

## 2023-06-19 DIAGNOSIS — C786 Secondary malignant neoplasm of retroperitoneum and peritoneum: Secondary | ICD-10-CM | POA: Diagnosis not present

## 2023-06-19 DIAGNOSIS — C482 Malignant neoplasm of peritoneum, unspecified: Secondary | ICD-10-CM

## 2023-06-19 NOTE — Progress Notes (Signed)
 Specialty Pharmacy Initial Fill Coordination Note  Robin Sellers is a 73 y.o. female contacted today regarding refills of specialty medication(s) Niraparib Tosylate (ZEJULA) .  Patient requested [name] at [facility] at [place]  on 06/21/23   Medication will be filled on 06/20/23.   Patient is aware of $1,996.20 copayment.

## 2023-06-19 NOTE — Progress Notes (Signed)
 Patient counseled in clinic telephone visit note on 06/19/23

## 2023-06-19 NOTE — Progress Notes (Signed)
 [facility]       Telephone: [phone]?Fax: [phone]   Oncology Clinical Pharmacist Practitioner Initial Assessment  Robin Sellers is a 73 y.o. female with a diagnosis of primary peritoneal carcinomatosis (PPC). They were contacted today via in-person visit.  Indication/Regimen Niraparib Robin Sellers) is being used appropriately for treatment of PPC by Dr. Artis Delay.      Wt Readings from Last 1 Encounters:  06/12/23 120 lb 3.2 oz (54.5 kg)    Estimated body surface area is 1.62 meters squared as calculated from the following:   Height as of 06/12/23: 5\' 8"  (1.727 m).   Weight as of 06/12/23: 120 lb 3.2 oz (54.5 kg).  The dosing regimen is 200 mg by mouth daily on days 1-28 of a 28-day cycle. This is being given  monotherapy. It is planned to continue until disease progression or unacceptable toxicity. Prescription dose and frequency assessed for appropriateness.  Patient has agreed to treatment which is documented in physician note on 06/12/23. Counseled patient on administration, dosing, side effects, monitoring, drug-food interactions, safe handling, storage, and disposal.  She will pick up the medication tomorrow and start on 06/23/23 per Dr. Bertis Ruddy instructions. She will then see Dr. Bertis Ruddy with labs on 06/29/23.  Dose Modifications None at this time   Access Assessment Robin Sellers will be receiving niraparib through [facility] Concerns: None Start date if known: 06/23/23  Adherence Assessment Reviewed importance on keeping a med schedule and plan for any missed doses Barriers to adherence identified? No  Allergies Allergies  Allergen Reactions   Artichoke [Cynara Scolymus (Artichoke)] Rash    Vitals    06/12/2023   12:11 PM 05/04/2023    2:24 PM 05/04/2023    8:06 AM  Oncology Vitals  Height 173 cm  173 cm  Weight 54.522 kg  53.706 kg  Weight (lbs) 120 lbs 3 oz  118 lbs 6 oz  BMI 18.28 kg/m2   18 kg/m2  Temp 98 F (36.7 C)  98.3 F (36.8 C)  Pulse Rate 78 75 86  BP 135/57 111/66 137/71  Resp 18 18 18   SpO2 100 % 98 % 97 %  BSA (m2) 1.62 m2  1.61 m2     Laboratory Data    Latest Ref Rng & Units 06/04/2023    7:53 AM 05/04/2023    7:26 AM 04/13/2023    8:28 AM  CBC EXTENDED  WBC 4.0 - 10.5 K/uL 4.9  5.6  10.4   RBC 3.87 - 5.11 MIL/uL 4.26  4.37  4.50   Hemoglobin 12.0 - 15.0 g/dL 65.7  84.6  96.2   HCT 36.0 - 46.0 % 39.4  38.8  39.6   Platelets 150 - 400 K/uL 142  192  163   NEUT# 1.7 - 7.7 K/uL 3.1  4.9  9.5   Lymph# 0.7 - 4.0 K/uL 1.3  0.6  0.7        Latest Ref Rng & Units 06/04/2023    7:53 AM 05/04/2023    7:26 AM 04/13/2023    8:28 AM  CMP  Glucose 70 - 99 mg/dL 952  841  324   BUN 8 - 23 mg/dL 22  19  27    Creatinine 0.44 - 1.00 mg/dL 4.01  0.27  2.53   Sodium 135 - 145 mmol/L 141  138  139   Potassium 3.5 - 5.1 mmol/L 4.6  4.3  3.9   Chloride 98 - 111  mmol/L 105  101  103   CO2 22 - 32 mmol/L 31  28  25    Calcium 8.9 - 10.3 mg/dL 9.4  9.4  9.6   Total Protein 6.5 - 8.1 g/dL 6.9  7.0  6.9   Total Bilirubin 0.0 - 1.2 mg/dL 0.5  0.5  0.4   Alkaline Phos 38 - 126 U/L 40  42  48   AST 15 - 41 U/L 18  19  17    ALT 0 - 44 U/L 13  13  12     Lab Results  Component Value Date   MG 1.8 12/21/2022   No results found for: "ZO1096"   Contraindications Contraindications were reviewed? Yes Contraindications to therapy were identified? No   Safety Precautions The following safety precautions for the use of niraparib were reviewed:  Fever: reviewed the importance of having a thermometer and the Centers for Disease Control and Prevention (CDC) definition of fever which is 100.33F (38C) or higher. Patient should call 24/7 triage at [phone] if experiencing a fever or any other symptoms Decreased white blood cells (WBCs) and increased risk for infection Decreased hemoglobin, part of the red blood cells that carry iron and oxygen Decreased platelet count and  increased risk for bleeding Abdominal pain Changes in electrolytes and other laboratory values Changes in kidney function Changes in liver function Constipation Fatigue Nausea or vomiting Muscle or joint pain or weakness Increase risk for blood cancers Mild to moderate rise in blood pressure Posterior reversible leukoencephalopathy syndrome (PRES) -- severe headache, seizures, confusion, changes in vision FD&C Yellow No. 5 -- previous reactions to aspirin Take with or without food Missed doses Handing body fluids and waste Pregnancy, sexual activity, and contraception Storage and handling  Medication Reconciliation Current Outpatient Medications  Medication Sig Dispense Refill   Biotin 5000 MCG CHEW Chew 5,000 mcg by mouth daily.     niraparib tosylate (ZEJULA) 200 MG tablet Take 1 tablet (200 mg total) by mouth daily. May take at bedtime to reduce nausea and vomiting. (Patient not taking: Reported on 06/19/2023) 30 tablet 11   ondansetron (ZOFRAN) 4 MG tablet Take 1-2 tablets (4-8 mg total) by mouth every 8 (eight) hours as needed for nausea or vomiting. (Patient not taking: Reported on 06/19/2023) 10 tablet 0   prochlorperazine (COMPAZINE) 10 MG tablet Take 1 tablet (10 mg total) by mouth every 6 (six) hours as needed for nausea or vomiting. (Patient not taking: Reported on 06/19/2023) 30 tablet 1   risedronate (ACTONEL) 150 MG tablet Take 150 mg by mouth every 30 (thirty) days. with water on empty stomach, nothing by mouth or lie down for next 30 minutes. (Patient not taking: Reported on 06/19/2023)     tretinoin (RETIN-A) 0.025 % cream Apply 1 application  topically at bedtime. (Patient not taking: Reported on 06/19/2023)     No current facility-administered medications for this visit.    Medication reconciliation is based on the patient's most recent medication list in the electronic medical record (EMR) including herbal products and OTC medications.   The patient's medication list  was reviewed today with the patient? Yes   Drug-drug interactions (DDIs) DDIs were evaluated? Yes Significant DDIs identified? No   Drug-Food Interactions Drug-food interactions were evaluated? Yes Drug-food interactions identified? No   Follow-up Plan  Patient education handout given to patient Start niraparib 200 mg by mouth daily on 06/23/23 Use ondansetron and prochlorperazine PRN for nausea Labs, Dr. Bertis Ruddy visit on 06/29/23 Robin Sellers can  follow up with clinical pharmacy as deemed necessary by Dr. Artis Delay going forward   Robin Sellers participated in the discussion, expressed understanding, and voiced agreement with the above plan. All questions were answered to their satisfaction. The patient was advised to contact the clinic at [phone] with any questions or concerns prior to their return visit.   I spent 30 minutes assessing the patient.  Robin Sellers A. Odetta Pink, PharmD, BCOP, CPP  Robin Sellers, RPH-CPP, 06/19/2023 1:35 PM  **Disclaimer: This note was dictated with voice recognition software. Similar sounding words can inadvertently be transcribed and this note may contain transcription errors which may not have been corrected upon publication of note.**

## 2023-06-20 ENCOUNTER — Other Ambulatory Visit: Payer: Self-pay

## 2023-06-29 ENCOUNTER — Encounter: Payer: Self-pay | Admitting: Hematology and Oncology

## 2023-06-29 ENCOUNTER — Inpatient Hospital Stay: Payer: Medicare Other | Attending: Hematology and Oncology

## 2023-06-29 ENCOUNTER — Inpatient Hospital Stay: Payer: Medicare Other | Admitting: Hematology and Oncology

## 2023-06-29 VITALS — BP 147/72 | HR 96 | Temp 97.9°F | Resp 18 | Ht 68.0 in | Wt 118.6 lb

## 2023-06-29 DIAGNOSIS — G44209 Tension-type headache, unspecified, not intractable: Secondary | ICD-10-CM

## 2023-06-29 DIAGNOSIS — Z8543 Personal history of malignant neoplasm of ovary: Secondary | ICD-10-CM | POA: Diagnosis present

## 2023-06-29 DIAGNOSIS — Z9079 Acquired absence of other genital organ(s): Secondary | ICD-10-CM | POA: Diagnosis not present

## 2023-06-29 DIAGNOSIS — D696 Thrombocytopenia, unspecified: Secondary | ICD-10-CM

## 2023-06-29 DIAGNOSIS — C7989 Secondary malignant neoplasm of other specified sites: Secondary | ICD-10-CM | POA: Insufficient documentation

## 2023-06-29 DIAGNOSIS — C7982 Secondary malignant neoplasm of genital organs: Secondary | ICD-10-CM | POA: Insufficient documentation

## 2023-06-29 DIAGNOSIS — C786 Secondary malignant neoplasm of retroperitoneum and peritoneum: Secondary | ICD-10-CM | POA: Insufficient documentation

## 2023-06-29 DIAGNOSIS — C482 Malignant neoplasm of peritoneum, unspecified: Secondary | ICD-10-CM

## 2023-06-29 DIAGNOSIS — Z79899 Other long term (current) drug therapy: Secondary | ICD-10-CM | POA: Diagnosis not present

## 2023-06-29 DIAGNOSIS — C785 Secondary malignant neoplasm of large intestine and rectum: Secondary | ICD-10-CM | POA: Diagnosis not present

## 2023-06-29 DIAGNOSIS — Z90722 Acquired absence of ovaries, bilateral: Secondary | ICD-10-CM | POA: Insufficient documentation

## 2023-06-29 DIAGNOSIS — Z9221 Personal history of antineoplastic chemotherapy: Secondary | ICD-10-CM | POA: Diagnosis not present

## 2023-06-29 DIAGNOSIS — Z90710 Acquired absence of both cervix and uterus: Secondary | ICD-10-CM | POA: Diagnosis not present

## 2023-06-29 LAB — CBC WITH DIFFERENTIAL/PLATELET
Abs Immature Granulocytes: 0.01 10*3/uL (ref 0.00–0.07)
Basophils Absolute: 0 10*3/uL (ref 0.0–0.1)
Basophils Relative: 0 %
Eosinophils Absolute: 0.1 10*3/uL (ref 0.0–0.5)
Eosinophils Relative: 3 %
HCT: 39.2 % (ref 36.0–46.0)
Hemoglobin: 13.3 g/dL (ref 12.0–15.0)
Immature Granulocytes: 0 %
Lymphocytes Relative: 22 %
Lymphs Abs: 1 10*3/uL (ref 0.7–4.0)
MCH: 31.1 pg (ref 26.0–34.0)
MCHC: 33.9 g/dL (ref 30.0–36.0)
MCV: 91.8 fL (ref 80.0–100.0)
Monocytes Absolute: 0.6 10*3/uL (ref 0.1–1.0)
Monocytes Relative: 13 %
Neutro Abs: 2.8 10*3/uL (ref 1.7–7.7)
Neutrophils Relative %: 62 %
Platelets: 146 10*3/uL — ABNORMAL LOW (ref 150–400)
RBC: 4.27 MIL/uL (ref 3.87–5.11)
RDW: 12.3 % (ref 11.5–15.5)
WBC: 4.5 10*3/uL (ref 4.0–10.5)
nRBC: 0 % (ref 0.0–0.2)

## 2023-06-29 LAB — COMPREHENSIVE METABOLIC PANEL
ALT: 16 U/L (ref 0–44)
AST: 23 U/L (ref 15–41)
Albumin: 4.8 g/dL (ref 3.5–5.0)
Alkaline Phosphatase: 48 U/L (ref 38–126)
Anion gap: 8 (ref 5–15)
BUN: 16 mg/dL (ref 8–23)
CO2: 31 mmol/L (ref 22–32)
Calcium: 9.3 mg/dL (ref 8.9–10.3)
Chloride: 100 mmol/L (ref 98–111)
Creatinine, Ser: 0.95 mg/dL (ref 0.44–1.00)
GFR, Estimated: >60 mL/min (ref >60)
Glucose, Bld: 111 mg/dL — ABNORMAL HIGH (ref 70–99)
Potassium: 3.8 mmol/L (ref 3.5–5.1)
Sodium: 139 mmol/L (ref 135–145)
Total Bilirubin: 0.6 mg/dL (ref 0.0–1.2)
Total Protein: 7.3 g/dL (ref 6.5–8.1)

## 2023-06-29 NOTE — Assessment & Plan Note (Addendum)
 She has intermittent atypical type tension sensation on the left temple She has strong family history of glioblastoma and is concerned she might have a brain tumor Despite my reassurance, ultimately, the patient is in agreement for referral to see neuro oncologist for further evaluation

## 2023-06-29 NOTE — Progress Notes (Addendum)
 [facility] OFFICE PROGRESS NOTE  Patient Care Team: Burton Apley, MD as PCP - General (Internal Medicine) Paulina Fusi Servando Snare, RN as Oncology Nurse Navigator (Oncology)  Assessment & Plan Primary peritoneal carcinomatosis [place] Day) The patient was initially diagnosed with primary peritoneal carcinomatosis in July 2022 Due to locally advanced disease, she was deemed not a surgical candidate She received neoadjuvant chemotherapy with excellent response to therapy followed by interval debulking surgery and subsequent completion of adjuvant treatment by January 2023 Pathology: Neg genetics, Molecular testing through [facility] in 2024: HER2/neu 0, negative, F0 LR 2+, 40%, negative, PD-L1 CPS score 5%, ER +75%, HRD negative  By February 2024, she was noted to have rising tumor marker.  Imaging study in May 2024 showed no evidence of disease but repeat imaging study by August 2024 show evidence of recurrence in the vaginal cuff, amenable to surgical resection The patient is given 6 more cycles of adjuvant chemotherapy with combination of carboplatin and paclitaxel, completed by January 2025  I reviewed CT imaging from February 2025 with the patient which show no evidence of residual disease We discussed the role of maintenance treatment and the patient elected to start olaparib on June 23, 2023 So far, she denies side effects from treatment but describes some atypical tension headache type sensation especially affecting on the left temple  I do not believe this is a typical side effects of niraparib For now, she will continue treatment as prescribed and we will bring her back again in 7 to 10 days for toxicity review I plan to repeat imaging study in May 2025  Tension-type headache, not intractable, unspecified chronicity pattern She has intermittent atypical type tension sensation on the left temple She has strong family history of glioblastoma and is concerned she might have a brain  tumor Despite my reassurance, ultimately, the patient is in agreement for referral to see neuro oncologist for further evaluation Thrombocytopenia (HCC) Likely due to recent treatment She is not symptomatic, monitor closely  Orders Placed This Encounter  Procedures   Amb Referral to Neuro Oncology    Referral Priority:   Routine    Referral Type:   Consultation    Referral Reason:   Specialty Services Required    Requested Specialty:   Oncology    Number of Visits Requested:   1     Artis Delay, MD  INTERVAL HISTORY: she returns for treatment follow-up Complications related to previous cycle of chemotherapy included thrombocytopenia, and atypical tension sensation on the left temple She started olaparib on March 1 She denies nausea The patient denies any recent signs or symptoms of bleeding such as spontaneous epistaxis, hematuria or hematochezia. She is concerned about atypical sensation that comes and goes as affecting the left side of her temple which she describes some form of tension sensation but not fully headaches She have no new neurological deficits PHYSICAL EXAMINATION: ECOG PERFORMANCE STATUS: 0 - Asymptomatic  Vitals:   06/29/23 0808  BP: (!) 147/72  Pulse: 96  Resp: 18  Temp: 97.9 F (36.6 C)  SpO2: 100%   Filed Weights   06/29/23 0808  Weight: 118 lb 9.6 oz (53.8 kg)    Relevant data reviewed during this visit included CBC and CMP

## 2023-06-29 NOTE — Assessment & Plan Note (Addendum)
 Likely due to recent treatment She is not symptomatic, monitor closely

## 2023-06-29 NOTE — Assessment & Plan Note (Addendum)
 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  Imaging study in May 2024 showed no evidence of disease but repeat imaging study by August 2024 show evidence of recurrence in the vaginal cuff, amenable to surgical resection The patient is given 6 more cycles of adjuvant chemotherapy with combination of carboplatin and paclitaxel, completed by January 2025  I reviewed CT imaging from February 2025 with the patient which show no evidence of residual disease We discussed the role of maintenance treatment and the patient elected to start olaparib on June 23, 2023 So far, she denies side effects from treatment but describes some atypical tension headache type sensation especially affecting on the left temple  I do not believe this is a typical side effects of niraparib For now, she will continue treatment as prescribed and we will bring her back again in 7 to 10 days for toxicity review I plan to repeat imaging study in May 2025

## 2023-06-30 LAB — CA 125: Cancer Antigen (CA) 125: 8.3 U/mL (ref 0.0–38.1)

## 2023-07-06 ENCOUNTER — Telehealth: Payer: Self-pay

## 2023-07-06 NOTE — Telephone Encounter (Signed)
 Called and given below message. She verbalized understanding.

## 2023-07-06 NOTE — Telephone Encounter (Signed)
 OK to take,

## 2023-07-06 NOTE — Telephone Encounter (Signed)
 She called and left a message asking if it is okay to take Cephalexin with Zejula? Her PCP wants to give her the antibiotic.

## 2023-07-09 ENCOUNTER — Inpatient Hospital Stay

## 2023-07-09 ENCOUNTER — Telehealth: Payer: Self-pay

## 2023-07-09 ENCOUNTER — Inpatient Hospital Stay: Admitting: Pharmacist

## 2023-07-09 VITALS — BP 134/65 | HR 100 | Temp 98.0°F | Resp 18 | Ht 68.0 in | Wt 116.4 lb

## 2023-07-09 DIAGNOSIS — C482 Malignant neoplasm of peritoneum, unspecified: Secondary | ICD-10-CM

## 2023-07-09 DIAGNOSIS — C786 Secondary malignant neoplasm of retroperitoneum and peritoneum: Secondary | ICD-10-CM | POA: Diagnosis not present

## 2023-07-09 LAB — CBC WITH DIFFERENTIAL/PLATELET
Abs Immature Granulocytes: 0.07 10*3/uL (ref 0.00–0.07)
Basophils Absolute: 0.1 10*3/uL (ref 0.0–0.1)
Basophils Relative: 1 %
Eosinophils Absolute: 0.1 10*3/uL (ref 0.0–0.5)
Eosinophils Relative: 1 %
HCT: 36.4 % (ref 36.0–46.0)
Hemoglobin: 12.4 g/dL (ref 12.0–15.0)
Immature Granulocytes: 1 %
Lymphocytes Relative: 8 %
Lymphs Abs: 0.7 10*3/uL (ref 0.7–4.0)
MCH: 30.8 pg (ref 26.0–34.0)
MCHC: 34.1 g/dL (ref 30.0–36.0)
MCV: 90.3 fL (ref 80.0–100.0)
Monocytes Absolute: 0.1 10*3/uL (ref 0.1–1.0)
Monocytes Relative: 2 %
Neutro Abs: 7.8 10*3/uL — ABNORMAL HIGH (ref 1.7–7.7)
Neutrophils Relative %: 87 %
Platelets: 154 10*3/uL (ref 150–400)
RBC: 4.03 MIL/uL (ref 3.87–5.11)
RDW: 11.8 % (ref 11.5–15.5)
WBC: 8.9 10*3/uL (ref 4.0–10.5)
nRBC: 0 % (ref 0.0–0.2)

## 2023-07-09 LAB — COMPREHENSIVE METABOLIC PANEL
ALT: 11 U/L (ref 0–44)
AST: 15 U/L (ref 15–41)
Albumin: 4.1 g/dL (ref 3.5–5.0)
Alkaline Phosphatase: 62 U/L (ref 38–126)
Anion gap: 6 (ref 5–15)
BUN: 16 mg/dL (ref 8–23)
CO2: 30 mmol/L (ref 22–32)
Calcium: 9.2 mg/dL (ref 8.9–10.3)
Chloride: 99 mmol/L (ref 98–111)
Creatinine, Ser: 0.92 mg/dL (ref 0.44–1.00)
GFR, Estimated: >60 mL/min (ref >60)
Glucose, Bld: 122 mg/dL — ABNORMAL HIGH (ref 70–99)
Potassium: 4.4 mmol/L (ref 3.5–5.1)
Sodium: 135 mmol/L (ref 135–145)
Total Bilirubin: 0.8 mg/dL (ref 0.0–1.2)
Total Protein: 7.6 g/dL (ref 6.5–8.1)

## 2023-07-09 MED ORDER — BENZONATATE 100 MG PO CAPS: 100.0000 mg | ORAL_CAPSULE | Freq: Three times a day (TID) | ORAL | 0 refills | Status: DC | PRN

## 2023-07-09 NOTE — Telephone Encounter (Signed)
 Returned her call. She is asking if she needs to keep appt today. She started antibiotic Friday and is feeling better. Told her to keep appts as scheduled. She recently tested for covid and is negative.

## 2023-07-09 NOTE — Progress Notes (Signed)
 [facility]       Telephone: [phone]?Fax: [phone]   Oncology Clinical Pharmacist Practitioner Progress Note  Robin Sellers was contacted via in-person visit to discuss her chemotherapy regimen for niraparib which they receive under the care of Dr. Artis Delay. She is accompanied by her daughter.  Current treatment regimen and start date Niraparib (06/23/23)  Interval History She continues on niraparib 200 mg by mouth daily on days 1 to 28 of a 28-day cycle. This is being given  monotherapy. Therapy is planned to continue until disease progression or unacceptable toxicity. She was last seen by clinical pharmacy on 06/19/23 and Dr. Bertis Ruddy on 06/29/23. She will see Dr. Bertis Ruddy again with labs on 07/17/23. She was prescribed cephalexin on 07/06/23 (10 day supply) for an upper respiratory infection she has had since 06/29/23. She has remained afebrile. The cephalexin has been added to her medication list. She is using loratadine PRN which seems to help. She tried dextromethorphan/guaifenesin but it elevated her heart rate. We sent some benzonatate (Tessalon) by mouth today to use as needed for her persistent cough.   Response to Therapy Ms. Martindelcampo has an elevated ANC today, likely due to the infection she describes. She has remained afebrile and Dr. Bertis Ruddy was okay with her continuing niraparib. Her other labs have remained stable. She should finish cephalexin on 07/15/23. This was prescribed by Dr. Zenaida Deed, her internal medicine physician that she sees when not feeling well. She will monitor for worsening symptoms. Labs, vitals, treatment parameters, and manufacturer guidelines assessing toxicity were reviewed with Felicity Pellegrini today. Based on these values, patient is in agreement to continue therapy at this time.  Allergies Allergies  Allergen Reactions   Artichoke [Cynara Scolymus (Artichoke)] Rash    Vitals    06/29/2023    8:08 AM 06/12/2023    12:11 PM 05/04/2023    2:24 PM  Oncology Vitals  Height 173 cm 173 cm   Weight 53.797 kg 54.522 kg   Weight (lbs) 118 lbs 10 oz 120 lbs 3 oz   BMI 18.03 kg/m2 18.28 kg/m2   Temp 97.9 F (36.6 C) 98 F (36.7 C)   Pulse Rate 96 78 75  BP 147/72 135/57 111/66  Resp 18 18 18   SpO2 100 % 100 % 98 %  BSA (m2) 1.61 m2 1.62 m2     Laboratory Data    Latest Ref Rng & Units 07/09/2023    9:05 AM 06/29/2023    7:47 AM 06/04/2023    7:53 AM  CBC EXTENDED  WBC 4.0 - 10.5 K/uL 8.9  4.5  4.9   RBC 3.87 - 5.11 MIL/uL 4.03  4.27  4.26   Hemoglobin 12.0 - 15.0 g/dL 53.6  64.4  03.4   HCT 36.0 - 46.0 % 36.4  39.2  39.4   Platelets 150 - 400 K/uL 154  146  142   NEUT# 1.7 - 7.7 K/uL 7.8  2.8  3.1   Lymph# 0.7 - 4.0 K/uL 0.7  1.0  1.3        Latest Ref Rng & Units 07/09/2023    9:05 AM 06/29/2023    7:47 AM 06/04/2023    7:53 AM  CMP  Glucose 70 - 99 mg/dL 742  595  638   BUN 8 - 23 mg/dL 16  16  22    Creatinine 0.44 - 1.00 mg/dL 7.56  4.33  2.95   Sodium 135 - 145 mmol/L 135  139  141   Potassium 3.5 - 5.1 mmol/L 4.4  3.8  4.6   Chloride 98 - 111 mmol/L 99  100  105   CO2 22 - 32 mmol/L 30  31  31    Calcium 8.9 - 10.3 mg/dL 9.2  9.3  9.4   Total Protein 6.5 - 8.1 g/dL 7.6  7.3  6.9   Total Bilirubin 0.0 - 1.2 mg/dL 0.8  0.6  0.5   Alkaline Phos 38 - 126 U/L 62  48  40   AST 15 - 41 U/L 15  23  18    ALT 0 - 44 U/L 11  16  13      Lab Results  Component Value Date   MG 1.8 12/21/2022   Adverse Effects Assessment No side effects reported for niraparib. On abx as described above for URI  Adherence Assessment Noe Amalie Koran reports missing 0 doses over the past 2 weeks.   Reason for missed dose: N/A Patient was re-educated on importance of adherence.   Access Assessment Erian Lariviere is currently receiving her niraparib through [facility] concerns:  None  Medication Reconciliation The patient's medication list was reviewed today  with the patient? Yes New medications or herbal supplements have recently been started? Yes , adding cephalexin (10 day course) as above. Will start tessalon pearls for cough, added loratadine PRN for sinus drainage Any medications have been discontinued? No  The medication list was updated and reconciled based on the patient's most recent medication list in the electronic medical record (EMR) including herbal products and OTC medications.   Medications Current Outpatient Medications  Medication Sig Dispense Refill   Biotin 5000 MCG CHEW Chew 5,000 mcg by mouth daily.     niraparib tosylate (ZEJULA) 200 MG tablet Take 1 tablet (200 mg total) by mouth daily. May take at bedtime to reduce nausea and vomiting. (Patient not taking: Reported on 06/19/2023) 30 tablet 11   ondansetron (ZOFRAN) 4 MG tablet Take 1-2 tablets (4-8 mg total) by mouth every 8 (eight) hours as needed for nausea or vomiting. (Patient not taking: Reported on 06/19/2023) 10 tablet 0   prochlorperazine (COMPAZINE) 10 MG tablet Take 1 tablet (10 mg total) by mouth every 6 (six) hours as needed for nausea or vomiting. (Patient not taking: Reported on 06/19/2023) 30 tablet 1   risedronate (ACTONEL) 150 MG tablet Take 150 mg by mouth every 30 (thirty) days. with water on empty stomach, nothing by mouth or lie down for next 30 minutes. (Patient not taking: Reported on 06/19/2023)     tretinoin (RETIN-A) 0.025 % cream Apply 1 application  topically at bedtime. (Patient not taking: Reported on 06/19/2023)     No current facility-administered medications for this visit.    Drug-Drug Interactions (DDIs) DDIs were evaluated? Yes Significant DDIs? No  The patient was instructed to speak with their health care provider and/or the oral chemotherapy pharmacist before starting any new drug, including prescription or over the counter, natural / herbal products, or vitamins.  Supportive Care Fever: reviewed the importance of having a thermometer  and the Centers for Disease Control and Prevention (CDC) definition of fever which is 100.62F (38C) or higher. Patient should call 24/7 triage at [phone] if experiencing a fever or any other symptoms Decreased white blood cells (WBCs) and increased risk for infection Decreased hemoglobin, part of the red blood cells that carry iron and oxygen Decreased platelet count and increased risk for bleeding Abdominal  pain Changes in electrolytes and other laboratory values Changes in kidney function Changes in liver function Constipation Fatigue Nausea or vomiting Muscle or joint pain or weakness Increase risk for blood cancers Mild to moderate rise in blood pressure Posterior reversible leukoencephalopathy syndrome (PRES) -- severe headache, seizures, confusion, changes in vision FD&C Yellow No. 5 -- previous reactions to aspirin Take with or without food Missed doses Handing body fluids and waste Pregnancy, sexual activity, and contraception  Dosing Assessment Hepatic adjustments needed? No  Renal adjustments needed? No  Toxicity adjustments needed? No  The current dosing regimen is appropriate to continue at this time.  Follow-Up Plan Continue niraparib 200 mg by mouth daily Continue cephalexin 250 mg QID until finished on 07/15/23 Monitor for worsening symptoms of URI Use Tessalon pearls PRN for cough and Claritin PRN for nasal drainage Labs, Dr. Bertis Ruddy visit scheduled for 07/20/23 Felicity Pellegrini can follow up with clinical pharmacy as deemed necessary by Dr. Artis Delay going forward   Felicity Pellegrini participated in the discussion, expressed understanding, and voiced agreement with the above plan. All questions were answered to her satisfaction. The patient was advised to contact the clinic at [phone] with any questions or concerns prior to her return visit.   I spent 30 minutes assessing and educating the patient.  Ronith Berti A. Odetta Pink, PharmD, BCOP,  CPP  Anselm Lis, RPH-CPP, 07/09/2023  9:56 AM   **Disclaimer: This note was dictated with voice recognition software. Similar sounding words can inadvertently be transcribed and this note may contain transcription errors which may not have been corrected upon publication of note.**

## 2023-07-10 ENCOUNTER — Emergency Department (HOSPITAL_BASED_OUTPATIENT_CLINIC_OR_DEPARTMENT_OTHER): Admitting: Radiology

## 2023-07-10 ENCOUNTER — Other Ambulatory Visit: Payer: Self-pay

## 2023-07-10 ENCOUNTER — Other Ambulatory Visit (HOSPITAL_COMMUNITY): Payer: Self-pay

## 2023-07-10 ENCOUNTER — Telehealth: Payer: Self-pay

## 2023-07-10 ENCOUNTER — Encounter (HOSPITAL_BASED_OUTPATIENT_CLINIC_OR_DEPARTMENT_OTHER): Payer: Self-pay | Admitting: Emergency Medicine

## 2023-07-10 ENCOUNTER — Emergency Department (HOSPITAL_BASED_OUTPATIENT_CLINIC_OR_DEPARTMENT_OTHER)
Admission: EM | Admit: 2023-07-10 | Discharge: 2023-07-10 | Disposition: A | Discharge status: Home / Self Care | Attending: Emergency Medicine | Admitting: Emergency Medicine

## 2023-07-10 DIAGNOSIS — J181 Lobar pneumonia, unspecified organism: Secondary | ICD-10-CM | POA: Insufficient documentation

## 2023-07-10 DIAGNOSIS — R059 Cough, unspecified: Secondary | ICD-10-CM | POA: Diagnosis present

## 2023-07-10 DIAGNOSIS — J189 Pneumonia, unspecified organism: Secondary | ICD-10-CM

## 2023-07-10 LAB — CA 125: Cancer Antigen (CA) 125: 10.7 U/mL (ref 0.0–38.1)

## 2023-07-10 LAB — RESP PANEL BY RT-PCR (RSV, FLU A&B, COVID)  RVPGX2
Influenza A by PCR: NEGATIVE
Influenza B by PCR: NEGATIVE
Resp Syncytial Virus by PCR: NEGATIVE
SARS Coronavirus 2 by RT PCR: NEGATIVE

## 2023-07-10 MED ORDER — GUAIFENESIN 100 MG/5ML PO LIQD
5.0000 mL | Freq: Once | ORAL | Status: AC
  Administered 2023-07-10: 5 mL via ORAL
  Filled 2023-07-10: qty 10

## 2023-07-10 MED ORDER — AMOXICILLIN-POT CLAVULANATE 875-125 MG PO TABS: 1.0000 | ORAL_TABLET | Freq: Two times a day (BID) | ORAL | 0 refills | Status: AC

## 2023-07-10 MED ORDER — SODIUM CHLORIDE 0.9 % IV BOLUS
1000.0000 mL | Freq: Once | INTRAVENOUS | Status: AC
  Administered 2023-07-10: 1000 mL via INTRAVENOUS

## 2023-07-10 MED ORDER — AZITHROMYCIN 250 MG PO TABS: 250.0000 mg | ORAL_TABLET | Freq: Every day | ORAL | 0 refills | Status: DC

## 2023-07-10 NOTE — ED Notes (Signed)
 Patient transported to X-ray

## 2023-07-10 NOTE — ED Notes (Signed)
 Called lab to run flu/covid swab on hold.

## 2023-07-10 NOTE — ED Triage Notes (Signed)
 C/o hacking cough x 11 days. Denies any recent illness. Hx of ovarian cancer. Currently taking chemo. Denies fevers.

## 2023-07-10 NOTE — ED Notes (Signed)
 Patient has incentive spirometer at home.

## 2023-07-10 NOTE — Telephone Encounter (Signed)
 Returned her call. She has not slept in 24 hours and feels horrible. She has been sick for 11 days. She is having trouble eating and drinking. She is complaining of severe coughing episodes, congestion. Denies fever. She is exhausted and asking what she should do. She is concerned.  She has been to PCP and does not want to go back to PCP. She started antibiotic on 3/14 from PCP.  Instructed to go to the ER to be evaluated. She verbalized understanding and will have her daughter take her now.  FYI

## 2023-07-10 NOTE — Telephone Encounter (Signed)
 She called and left a message. She just left the ER at [facility]. She has pneumonia and they are starting her on a different antibiotic. She is asking if she can stop the [place] while on the antibiotic?

## 2023-07-10 NOTE — Telephone Encounter (Signed)
 Yes, HOLD olaparib while on antibiotics, resume after it is complete

## 2023-07-10 NOTE — Telephone Encounter (Signed)
 Called and given below message. She verbalized understanding.

## 2023-07-10 NOTE — Discharge Instructions (Addendum)
 As discussed, you have bilateral pneumonia of the lower lobes.  Take Azithromycin, 500 mg the first day and then 250 mg for total of 5 days, as well as Augmentin twice a day for 7 days.  Antibiotics usually take 2 days to kick in and then he should start to see improvement of symptoms on the third day.  Please complete full course of antibiotics to ensure resolution of infection.  You can take OTC cough medications like Delsym or Robitussin if you would like as well.  You can also try medications like Ibuprofen, Tylenol, or lidocaine patches to help with the rib pain with coughing.  Follow-up with your primary care provider in the next 4 to 6-week for repeat chest x-ray to make sure pneumonia has cleared.  Get help right away if: Your shortness of breath becomes worse. Your chest pain increases. Your sickness becomes worse, especially if you are an older adult or have a weak immune system. You cough up blood.

## 2023-07-10 NOTE — ED Provider Notes (Signed)
 [place] EMERGENCY DEPARTMENT AT [facility] Provider Note   CSN: [id number] Arrival date & time: 07/10/23  0915     History  Chief Complaint  Patient presents with   Cough    Robin Sellers is a 73 y.o. female with a history of peritoneal carcinomatosis, and neuropathy due to chemotherapy, and thrombocytopenia presents the ED today for cough.  Patient reports persistent cough for the past 11 days.  Denies any associated fevers, nausea, or vomiting.  No known sick contact.  Has some right sided rib pain with coughing.  She is tried OTC cough medication for several days but it raise her heart rate so she stopped taking it.  She is currently undergoing chemotherapy for cancer.  Only has chest pain and shortness of breath while coughing.  Denies history of asthma, COPD, or smoking. No any additional complaints or concerns at this time.    Home Medications Prior to Admission medications   Medication Sig Start Date End Date Taking? Authorizing Provider  amoxicillin-clavulanate (AUGMENTIN) 875-125 MG tablet Take 1 tablet by mouth every 12 (twelve) hours for 7 days. 07/10/23 07/17/23 Yes Maxwell Marion, PA-C  azithromycin (ZITHROMAX) 250 MG tablet Take 1 tablet (250 mg total) by mouth daily. Take first 2 tablets together, then 1 every day until finished for a total of 5 days 07/10/23  Yes Maxwell Marion, PA-C  HYDROcodone-acetaminophen (NORCO/VICODIN) 5-325 MG tablet Take 1 tablet by mouth 4 (four) times daily. 07/07/23  Yes [provider]  ofloxacin (OCUFLOX) 0.3 % ophthalmic solution Place 1 drop into the left eye 3 (three) times daily. 06/14/23  Yes [provider]  Biotin 5000 MCG CHEW Chew 5,000 mcg by mouth daily.    [provider]  cephALEXin (KEFLEX) 250 MG capsule Take by mouth 4 (four) times daily. 07/06/23 07/15/23  Burton Apley, MD  loratadine (CLARITIN) 10 MG tablet Take 10 mg by mouth daily.    [provider]  niraparib tosylate  (ZEJULA) 200 MG tablet Take 1 tablet (200 mg total) by mouth daily. May take at bedtime to reduce nausea and vomiting. 06/12/23   Artis Delay, MD  ondansetron (ZOFRAN) 4 MG tablet Take 1-2 tablets (4-8 mg total) by mouth every 8 (eight) hours as needed for nausea or vomiting. Patient not taking: Reported on 07/09/2023 12/22/22   Warner Mccreedy D, NP  prochlorperazine (COMPAZINE) 10 MG tablet Take 1 tablet (10 mg total) by mouth every 6 (six) hours as needed for nausea or vomiting. Patient not taking: Reported on 07/09/2023 01/15/23   Artis Delay, MD      Allergies    [place] scolymus (artichoke)]    Review of Systems   Review of Systems  Respiratory:  Positive for cough.   All other systems reviewed and are negative.   Physical Exam Updated Vital Signs BP (!) 152/75   Pulse 93   Temp 98.3 F (36.8 C)   Resp 16   Ht 5\' 4"  (1.626 m)   Wt 52 kg   SpO2 97%   BMI 19.68 kg/m  Physical Exam Vitals and nursing note reviewed.  Constitutional:      Appearance: Normal appearance.  HENT:     Head: Normocephalic and atraumatic.     Mouth/Throat:     Mouth: Mucous membranes are moist.  Eyes:     Conjunctiva/sclera: Conjunctivae normal.     Pupils: Pupils are equal, round, and reactive to light.  Cardiovascular:     Rate and Rhythm: Normal rate  and regular rhythm.     Pulses: Normal pulses.     Heart sounds: Normal heart sounds.  Pulmonary:     Effort: Pulmonary effort is normal.     Breath sounds: Normal breath sounds.  Abdominal:     Palpations: Abdomen is soft.     Tenderness: There is no abdominal tenderness.  Musculoskeletal:     Cervical back: Normal range of motion.  Skin:    General: Skin is warm and dry.     Findings: No rash.  Neurological:     General: No focal deficit present.     Mental Status: She is alert.  Psychiatric:        Mood and Affect: Mood normal.        Behavior: Behavior normal.    ED Results / Procedures / Treatments   Labs (all labs  ordered are listed, but only abnormal results are displayed) Labs Reviewed  RESP PANEL BY RT-PCR (RSV, FLU A&B, COVID)  [id number]    EKG None  Radiology DG Chest 2 View Result Date: 07/10/2023 CLINICAL DATA:  Cough. EXAM: CHEST - 2 VIEW COMPARISON:  Chest radiograph dated 08/21/2020. FINDINGS: Bilateral lower lobe airspace opacities new since the prior study is and concerning for developing infiltrate. Clinical correlation and follow-up to resolution recommended. No pleural effusion or pneumothorax. The cardiac silhouette is within normal limits. No acute osseous pathology. IMPRESSION: Bilateral lower lobe infiltrates.  Follow-up recommended. Electronically Signed   By: Elgie Collard M.D.   On: 07/10/2023 10:51    Procedures Procedures: not indicated.   Medications Ordered in ED Medications  sodium chloride 0.9 % bolus 1,000 mL (0 mLs Intravenous Stopped 07/10/23 1153)  guaiFENesin (ROBITUSSIN) 100 MG/5ML liquid 5 mL (5 mLs Oral Given 07/10/23 1051)    ED Course/ Medical Decision Making/ A&P                                 Medical Decision Making Amount and/or Complexity of Data Reviewed Radiology: ordered.  Risk OTC drugs. Prescription drug management.   This patient presents to the ED for concern of cough, this involves an extensive number of treatment options, and is a complaint that carries with it a high risk of complications and morbidity.   Differential diagnosis includes: Flu, COVID, RSV, other viral illness,, pneumonia, etc.   Comorbidities  See HPI above   Additional History  Additional history obtained from prior records   Lab Tests  I ordered and personally interpreted labs.  The pertinent results include:   Negative respiratory panel   Imaging Studies  I ordered imaging studies including CXR  I independently visualized and interpreted imaging which showed:  Bilateral lower lobe infiltrate.  Follow-up recommended. I agree with the radiologist  interpretation   Problem List / ED Course / Critical Interventions / Medication Management  Patient is currently undergoing chemotherapy for peritoneal carcinomatosis.  She states that she has been having a persistent cough for the past 11 days with intermittent nausea.  No fevers, vomiting, abdominal pain, or diarrhea.  Denies any known sick contact.  She tried taking over-the-counter cough medicine without improvement.  She states that she has lost her appetite and has not been eating much either.  She is concerned about dehydration. I ordered medications including: Fluids and Robitussin for dehydration and cough Reevaluation of the patient after these medicines showed that the patient improved Discussed findings with patient and family ember  at bedside.  All questions were answered.   Social Determinants of Health  Access to healthcare   Test / Admission - Considered  Patient is stable and safe for discharge home. Return precautions given.       Final Clinical Impression(s) / ED Diagnoses Final diagnoses:  Pneumonia of both lower lobes due to infectious organism    Rx / DC Orders ED Discharge Orders          Ordered    azithromycin (ZITHROMAX) 250 MG tablet  Daily        07/10/23 1130    amoxicillin-clavulanate (AUGMENTIN) 875-125 MG tablet  Every 12 hours        07/10/23 1130              Maxwell Marion, PA-C 07/10/23 1602    Vanetta Mulders, MD 07/13/23 [phone]

## 2023-07-11 ENCOUNTER — Telehealth: Payer: Self-pay | Admitting: *Deleted

## 2023-07-11 NOTE — Telephone Encounter (Signed)
 Spoke with Robin Sellers who called the office stating she has pneumonia and is not sure if she will make her appt. This Friday at 1:30 with Dr. [name]. Pt is on antibiotics and was advised to reach back out to the office either tomorrow or early Friday and let us know how she is feeling. Pt verbalized understanding, and thanked the office.

## 2023-07-12 NOTE — Telephone Encounter (Signed)
 Spoke with Robin Sellers and patient states she is feeling better today and plans on keeping her appt. With Dr. [name] tomorrow, Friday, 3/21 at 1:30.

## 2023-07-13 ENCOUNTER — Inpatient Hospital Stay: Payer: Medicare Other | Admitting: Gynecologic Oncology

## 2023-07-13 ENCOUNTER — Other Ambulatory Visit: Payer: Self-pay

## 2023-07-13 ENCOUNTER — Encounter: Payer: Self-pay | Admitting: Gynecologic Oncology

## 2023-07-13 VITALS — BP 128/68 | HR 97 | Temp 98.0°F | Resp 18 | Wt 115.0 lb

## 2023-07-13 DIAGNOSIS — C7989 Secondary malignant neoplasm of other specified sites: Secondary | ICD-10-CM | POA: Diagnosis not present

## 2023-07-13 DIAGNOSIS — C7982 Secondary malignant neoplasm of genital organs: Secondary | ICD-10-CM | POA: Diagnosis not present

## 2023-07-13 DIAGNOSIS — C786 Secondary malignant neoplasm of retroperitoneum and peritoneum: Secondary | ICD-10-CM

## 2023-07-13 DIAGNOSIS — C482 Malignant neoplasm of peritoneum, unspecified: Secondary | ICD-10-CM

## 2023-07-13 DIAGNOSIS — C785 Secondary malignant neoplasm of large intestine and rectum: Secondary | ICD-10-CM | POA: Diagnosis not present

## 2023-07-13 DIAGNOSIS — Z8543 Personal history of malignant neoplasm of ovary: Secondary | ICD-10-CM

## 2023-07-13 NOTE — Progress Notes (Signed)
 Gynecologic Oncology Return Clinic Visit  07/13/23  Reason for Visit: follow-up  Treatment History: Oncology History Overview Note  Neg genetics,  Molecular testing through [facility] in 2024:  HER2/neu 0, negative F0 LR 2+, 40%, negative PD-L1 CPS score 5% ER +75% HRD negative   Primary peritoneal carcinomatosis (HCC)  08/21/2020 Initial Diagnosis   The patient reports intermittent right upper quadrant pain that feel like she pulled a muscle beginning in March 2022.  In April, she woke up with sharp pain in her right upper quadrant.  This was her first episode of sharp pain.  She thought that her pain was related to her gallbladder and presented to the emergency department.  She was seen in ED on 4/30. RUQ ultrasound and x-ray were normal as were LFTs and lipase.    10/27/2020 Imaging   Findings highly suspicious for peritoneal carcinomatosis. No site of primary malignancy identified.   Colonic diverticulosis, without radiographic evidence of diverticulitis   11/02/2020 Initial Diagnosis   Primary peritoneal carcinomatosis (HCC)   11/02/2020 Tumor Marker   Patient's tumor was tested for the following markers: CA-125. Results of the tumor marker test revealed 88.   11/11/2020 Pathology Results   FINAL MICROSCOPIC DIAGNOSIS:   A. CUL DE SAC, LEFT ANTERIOR, EXCISION:  -  Poorly differentiated carcinoma  -  See comment   B. SIDEWALL, RIGHT, BIOPSY:  -  Poorly differentiated carcinoma  -  See comment   COMMENT:   By immunohistochemistry, the neoplastic cells are positive for cytokeratin 7, p53, PAX8 and WT1 but negative for cytokeratin 20, GATA3, CDX2 and TTF-1.  Overall, the immunophenotype is consistent with a  gynecologic primary.    11/11/2020 Pathology Results   FINAL MICROSCOPIC DIAGNOSIS:  - Malignant cells consistent with metastatic adenocarcinoma    11/11/2020 Surgery   Pre-operative Diagnosis: Carcinomatosis, mildly elevated CA-125   Post-operative Diagnosis: same, At  least stage IIIC gyn malignancy   Operation: Diagnostic laparoscopy, peritoneal biopsies    Surgeon: Eugene Garnet MD Operative Findings: On EUA, small mobile uterus. On intra-abdominal entry, carcinomatosis appreciated studding the right anterior diaphragm, bilateral liver surfaces, mesentery, pelvic peritoneum. Omental cake with measuring up to 2x3cm. Right ovary adherent to the right uterus with peritoneal studding adjacent. Frondular implants noted along right pelvic sidewall. Miliary disease along anterior cul de sac. Cul de sac covered with friable miliary disease. Small volume dark amber ascites. Specimens: left anterior cul de sac peritoneum, right pelvic sidewall peritoneal biopsy          11/15/2020 Cancer Staging   Staging form: Ovary, Fallopian Tube, and Primary Peritoneal Carcinoma, AJCC 8th Edition - Clinical stage from 11/15/2020: FIGO Stage III (cT3, cN0, cM0) - Signed by Artis Delay, MD on 11/15/2020 Stage prefix: Initial diagnosis   11/26/2020 - 05/13/2021 Chemotherapy   Patient is on Treatment Plan : OVARIAN Carboplatin (AUC 6) / Paclitaxel (175) q21d x 6 cycles     11/26/2020 Tumor Marker   Patient's tumor was tested for the following markers: CA-125. Results of the tumor marker test revealed 76.8.   12/17/2020 Tumor Marker   Patient's tumor was tested for the following markers: CA-125. Results of the tumor marker test revealed 58.1.   01/07/2021 Tumor Marker   Patient's tumor was tested for the following markers: CA-125. Results of the tumor marker test revealed 21.6.   01/20/2021 Imaging   Decreased omental soft tissue nodularity and caking, consistent with interval improvement in carcinoma.   No evidence of new or progressive disease within  the abdomen or pelvis.   Colonic diverticulosis. No radiographic evidence of diverticulitis.   Large stool burden noted; recommend clinical correlation for possible constipation   02/24/2021 Surgery   Robotic-assisted  laparoscopic total hysterectomy with bilateral salpingoophorectomy, peritoneal stripping, mini-lap for omentectomy and intra-abdominal palpation  Findings: On EUA, small mobile uterus. Rectum free. On intra-abdominal entry, normal upper abdominal survey including liver edge, diaphragm and stomach. Omentum with several small (<2cm) areas of thickening, suspected treated tumor versus residual tumor. Uterus 6cm and normal in appearance. Atrophic appearing bilateral adnexa. Some adhesions of the left ovary to the medial leaf of the broad ligament. Minimal peritoneal nodularity versus treated disease within posterior cul de sac and deep left pelvis, all either excised or ablated. No significant peritoneal disease or carcinomatosis. No sigmoid or rectal involvement. Small bowel normal in appearance. No adenopathy. No ascites. R0 resection.    02/24/2021 Pathology Results   A. PERTIONEAL SCAR, EXCISION:  - Microscopic focus of high-grade serous carcinoma   B. 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 OMENTUM, RESECTION:  - Microscopic foci of high-grade serous carcinoma   03/14/2021 Tumor Marker   Patient's tumor was tested for the following markers: CA-125. Results of the tumor marker test revealed 32.7.   03/23/2021 Imaging   Unremarkable right upper quadrant ultrasound   04/01/2021 Genetic Testing   HRD status through [facility] was negative.  The report date is 04/01/2021. Negative hereditary cancer genetic testing: no pathogenic variants detected in [facility].  The report date is 04/04/2021.  The [facility] gene panel offered by [employer] includes sequencing and deletion/duplication testing of the following 48 genes: APC, ATM, AXIN2, BAP1, BARD1,  BMPR1A, BRCA1, BRCA2, BRIP1, CHD1, CDK4, CDKN2A(p16 and p14ARF), CHEK2, CTNNA1, EGFR, EPCAM, FH, FLCN, GREM1, HOXB13, MEN1, MET, MITF , MLH1, MSH2, MSH3, MSH6, MUTYH, NTHL1, PALB2, PMS2, POLD1, POLE, PTEN, RAD51C, RAD51D, RET, SDHA, SDHB, SDHC, SDHD, SMAD4, STK11,TERT, TP53, TSC1, TSC2, and VHL.   04/12/2021 Tumor Marker   Patient's tumor was tested for the following markers: CA-125. Results of the tumor marker test revealed 12.7.   06/13/2021 Imaging   1. No new mass or lymphadenopathy identified in the abdomen or pelvis. Interval resolution of previous omental soft tissue caking. 2. Other ancillary findings.   06/13/2021 Tumor Marker   Patient's tumor was tested for the following markers: CA-125. Results of the tumor marker test revealed 10.1.   07/11/2021 Tumor Marker   Patient's tumor was tested for the following markers: CA-125. Results of the tumor marker test revealed 9.4.   09/08/2021 Imaging   1. Status post hysterectomy and omentectomy. 2. No evidence of lymphadenopathy or metastatic disease in the abdomen or pelvis.     09/08/2021 Tumor Marker   Patient's tumor was tested for the following markers: CA-125. Results of the tumor marker test revealed 9.0.   02/22/2022 Imaging   IMPRESSION: 1. No acute findings.  No ascites, mass or adenopathy. 2.  Aortic Atherosclerosis (ICD10-170.0).   03/10/2022 Tumor Marker   Patient's tumor was tested for the following markers: CA-125. Results of the tumor marker test revealed 11.7.   06/09/2022 Tumor Marker   Patient's tumor was tested for the following markers: CA-125. Results of the tumor marker test revealed 29.2.   09/07/2022 Tumor  Marker   Patient's tumor was tested for the following markers: CA-125. Results of the tumor marker test revealed 39.7.   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 RADIATION DOSE REDUCTION: This exam was performed according to the departmental dose-optimization program which includes automated exposure control, adjustment of the mA and/or kV according to patient size and/or use of iterative reconstruction technique. CONTRAST:  60mL OMNIPAQUE IOHEXOL 350 MG/ML SOLN COMPARISON:  06/22/2022 and previous FINDINGS: Lower chest: No pleural or pericardial effusion. Visualized lung bases grossly clear, with some motion degradation. Hepatobiliary: No focal liver abnormality is seen. No gallstones, gallbladder wall thickening, or biliary dilatation. Pancreas: Unremarkable. No pancreatic ductal dilatation or surrounding inflammatory changes. Spleen: Normal in size without focal abnormality. Accessory splenule. Adrenals/Urinary Tract: No adrenal mass. Symmetric renal parenchymal enhancement without urolithiasis or hydronephrosis. Urinary bladder incompletely distended. Stomach/Bowel: Stomach incompletely distended, unremarkable. Small bowel decompressed. Post appendectomy. Gas and fecal material partially distend the colon, without acute finding. A few scattered diverticula in the sigmoid segment. Vascular/Lymphatic: Minimal calcified aortic plaque without aneurysm or stenosis. Portal vein patent. A few scattered mesenteric lymph nodes none greater than 4 mm short axis diameter. No abdominal or pelvic adenopathy. Reproductive: Status post hysterectomy. No adnexal masses. No peritoneal nodularity. Other: No ascites. No free air. Bilateral pelvic phleboliths stable. Musculoskeletal: Stable lower lumbar spondylitic change with early grade and grade 1 anterolisthesis L4-5 and degenerative disc disease L5-S1. No fracture or worrisome bone lesion. IMPRESSION: 1. No acute findings. 2. No evidence of recurrent or metastatic disease. 3.  Sigmoid diverticulosis. Electronically Signed   By: Corlis Leak M.D.   On: 09/15/2022 20:09      11/24/2022 Tumor Marker   Patient's tumor was tested for the following markers: CA-125. Results of the tumor marker test revealed 47.6.   12/04/2022 Imaging   CT A/P 1. A new 2.4 x 2.1 cm low attenuating structure in the right pelvis abutting the vaginal cuff concerning for recurrent disease, given rise in CA 125. Clinical correlation is recommended. 2. No bowel obstruction. Appendectomy. 3.  Aortic Atherosclerosis (ICD10-I70.0).   12/20/2022 Surgery   Robotic-assisted excision of pelvic mass requiring colpotomy with vaginal cuff closure [name]), excision of left para-colic gutter implant (White), low anterior resection with reanastomosis [name]), cystoscopy [name])   Findings: On EUA, 2-3 cm mass is palpable in the vaginal apex, and close proximity to the rectum but without direct involvement.  Intra-abdominal entry, normal-appearing diaphragm, liver edge, and stomach.  No peritoneal disease or implants noted.  Normal-appearing small bowel.  Surgically absent uterus, cervix, fallopian tubes and ovaries.  Small, 5 mm cystic nodule noted along the left paracolic gutter lateral to the sigmoid colon (excised).  Within the pelvis, there was a 3 cm cystic mass, apparently bilobed, adherent to the posterior aspect of the vaginal cuff in the midline into the right.  This was close to but not directly involving the rectum nor much of the rectal mesentery.  There was minimal spillage from the cystic lesion during manipulation to excise the specimen.  Upon resection of this area, the more superior aspect of the rectum was identified as having a similar appearing 1-1.5 cm cystic mass involving the rectum and adjacent mesentery.  The entire lesion was removed  within the segment of rectosigmoid colon.  No additional implants or findings concerning for metastatic disease were noted in the abdomen or pelvis. R0  resection. On cystoscopy, bladder dome intact. Good efflux noted from bilateral ureteral orifices.   12/20/2022 Pathology Results   A. PELVIC MASS, EXCISION:  - High-grade serous carcinoma, involving vaginal mucosa and adjacent  soft tissue   B. LEFT PERICOLIC GUTTER NODULE, EXCISION:  - High-grade serous carcinoma  - Resection margin is negative for carcinoma   C. RECTOSIGMOID COLON, RESECTION:  - High-grade serous carcinoma involving segment of rectosigmoid colon  - Lymph node, negative for carcinoma (0/1)  - Resection margins, negative for carcinoma   D. FINAL DISTAL MARGIN OF ANASTOMOTIC RING, EXCISION:  - Colonic donut, negative for carcinoma   Cytology FINAL MICROSCOPIC DIAGNOSIS:  - Malignant cells present  - See comment     01/19/2023 - 05/04/2023 Chemotherapy   Patient is on Treatment Plan : OVARIAN Carboplatin (AUC 6) + Paclitaxel (175) q21d X 6 Cycles     01/19/2023 Tumor Marker   Patient's tumor was tested for the following markers: CA-125. Results of the tumor marker test revealed 11.2.   02/12/2023 Tumor Marker   Patient's tumor was tested for the following markers: CA-125. Results of the tumor marker test revealed 9.9.   03/05/2023 Tumor Marker   Patient's tumor was tested for the following markers: CA-125. Results of the tumor marker test revealed 11.7.   03/21/2023 Tumor Marker   Patient's tumor was tested for the following markers: CA-125. Results of the tumor marker test revealed 10.2.   04/16/2023 Tumor Marker   Patient's tumor was tested for the following markers: CA-125. Results of the tumor marker test revealed 10.4.   06/04/2023 Imaging   CT ABDOMEN PELVIS W CONTRAST Result Date: 06/05/2023 CLINICAL DATA:  History of ovarian carcinoma, assess treatment response EXAM: CT ABDOMEN AND PELVIS WITH CONTRAST TECHNIQUE: Multidetector CT imaging of the abdomen and pelvis was performed using the standard protocol following bolus administration of  intravenous contrast. RADIATION DOSE REDUCTION: This exam was performed according to the departmental dose-optimization program which includes automated exposure control, adjustment of the mA and/or kV according to patient size and/or use of iterative reconstruction technique. CONTRAST:  OMNIPAQUE IOHEXOL 300 MG/ML  SOLN COMPARISON:  12/04/2022 and previous FINDINGS: Lower chest: No pleural or pericardial effusion. Visualized lung bases clear. Hepatobiliary: No focal liver abnormality is seen. No gallstones, gallbladder wall thickening, or biliary dilatation. Pancreas: Unremarkable. No pancreatic ductal dilatation or surrounding inflammatory changes. Spleen: Normal in size without focal abnormality. Small accessory splenule, stable. Adrenals/Urinary Tract: No adrenal mass. Symmetric renal parenchymal enhancement without focal lesion, hydronephrosis, or evident urolithiasis. Urinary bladder nondistended. Stomach/Bowel: Stomach incompletely distended, unremarkable. Small bowel nondistended. Post appendectomy. The colon is partially distended. Anastomotic staple line at the rectosigmoid junction. Vascular/Lymphatic: Minimal calcified aortic plaque without aneurysm. Patent portal and renal veins. No abdominal or pelvic adenopathy. Reproductive: Status post hysterectomy. No adnexal masses. The cystic process previously described in the region of the vaginal cuff on the right is no longer visualized. Other: Left pelvic phlebolith.  No ascites.  No free air. Musculoskeletal: Mild spondylitic changes throughout the lumbar spine. Grade 1 anterolisthesis L4-5 attributed to bilateral facet DJD. IMPRESSION: 1. No acute findings. 2. Interval resolution of the cystic process at the vaginal cuff. 3. No evidence of recurrent or metastatic disease. Electronically Signed   By: Corlis Leak M.D.   On: 06/05/2023 13:20      06/05/2023  Tumor Marker   Patient's tumor was tested for the following markers: CA-125. Results of the  tumor marker test revealed 9.5.   06/30/2023 Tumor Marker   Patient's tumor was tested for the following markers: CA-125. Results of the tumor marker test revealed 8.3.   07/10/2023 Tumor Marker   Patient's tumor was tested for the following markers: CA-`125. Results of the tumor marker test revealed 10.7.     Interval History: Overall doing well although recently diagnosed with pneumonia.  Finishes one of her antibiotics tomorrow and has 5 days of the second.  Was sick for about 11 days for being seen in the emergency department.  She is now finally starting to feel better, sleeping better and her cough is improved.  Appetite is also improving.  She had some constipation although has started having regular daily bowel movements as of middle of this week.  Did not have any side effects after starting PARPi, holding medication now in the setting of recent infection.  Past Medical/Surgical History: Past Medical History:  Diagnosis Date   Anemia    Cancer (HCC)    ovarian   Family history of breast cancer 03/14/2021   Hole, retinal    Right eye   Osteoporosis    Polyp of colon, unspecified part of colon, unspecified type    Benign    Past Surgical History:  Procedure Laterality Date   APPENDECTOMY  02/27/2007   [facility]   BILATERAL SALPINGECTOMY Right 03/1978   right ectopic, tied other tube   BOWEL RESECTION N/A 12/20/2022   Procedure: RECTOSIGMOID RESECTION AND REANASTAMOSIS, MINI LAPAROTOMY;  Surgeon: [name], MD;  Location: [facility];  Service: Gynecology;  Laterality: N/A;   CATARACT EXTRACTION Right 10/31/2011   Dr. Dione Booze   COLONOSCOPY  03/2019   Dr. Levora Angel   COLONOSCOPY WITH PROPOFOL N/A 04/01/2018   Procedure: COLONOSCOPY WITH PROPOFOL;  Surgeon: Kathi Der, MD;  Location: [facility];  Service: Gastroenterology;  Laterality: N/A;   CYSTOSCOPY  12/20/2022   Procedure: CYSTOSCOPY;  Surgeon: [name], MD;  Location: [facility];  Service: Gynecology;;    DILATION AND CURETTAGE OF UTERUS     x3, 2 for miscarriages, one for polyp vs fibroid   HAND / FINGER LESION EXCISION Left    lt. index finger   LAPAROSCOPIC HYSTERECTOMY     LAPAROSCOPY N/A 11/11/2020   Procedure: LAPAROSCOPY DIAGNOSTIC WITH PERITONEAL BIOPSY;  Surgeon: [name], MD;  Location: [facility];  Service: Gynecology;  Laterality: N/A;   POLYPECTOMY  04/01/2018   Procedure: POLYPECTOMY;  Surgeon: Kathi Der, MD;  Location: [facility];  Service: Gastroenterology;;   RETINAL TEAR REPAIR CRYOTHERAPY Right 04/25/2011   Dr. Luciana Axe   ROBOT ASSISTED MYOMECTOMY N/A 12/20/2022   Procedure: DIAGNOSTIC LAPAROSCOPY, XI ROBOTIC ASSISTED PELVIC MASS EXCISION;  Surgeon: [name], MD;  Location: [facility];  Service: Gynecology;  Laterality: N/A;   SUBMUCOSAL INJECTION  04/01/2018   Procedure: SUBMUCOSAL INJECTION;  Surgeon: Kathi Der, MD;  Location: [facility];  Service: Gastroenterology;;   TUBAL LIGATION Left 03/1978    Family History  Problem Relation Age of Onset   Lung cancer Mother 66       smoking hx   Skin cancer Father        dx after 50; non-melanoma; scalp   Lung cancer Maternal Aunt        dx after 50; smoking hx   Breast cancer Daughter 40       ER+  Skin cancer Cousin        face; surgery only   Ovarian cancer Neg Hx    Endometrial cancer Neg Hx    Prostate cancer Neg Hx    Pancreatic cancer Neg Hx    Colon cancer Neg Hx     Social History   Socioeconomic History   Marital status: Legally Separated    Spouse name: Not on file   Number of children: Not on file   Years of education: Not on file   Highest education level: Not on file  Occupational History   Not on file  Tobacco Use   Smoking status: Never    Passive exposure: Past (18 years)   Smokeless tobacco: Never  Vaping Use   Vaping status: Never Used  Substance and Sexual Activity   Alcohol use: Not Currently   Drug use: Never   Sexual activity: Not Currently   Other Topics Concern   Not on file  Social History Narrative   Not on file   Social Drivers of Health   Financial Resource Strain: Not on file  Food Insecurity: No Food Insecurity (12/21/2022)   Hunger Vital Sign    Worried About Running Out of Food in the Last Year: Never true    Ran Out of Food in the Last Year: Never true  Transportation Needs: No Transportation Needs (12/21/2022)   PRAPARE - Administrator, Civil Service (Medical): No    Lack of Transportation (Non-Medical): No  Physical Activity: Not on file  Stress: Not on file  Social Connections: Not on file    Current Medications:  Current Outpatient Medications:    Biotin 5000 MCG CHEW, Chew 5,000 mcg by mouth daily., Disp: , Rfl:    cephALEXin (KEFLEX) 250 MG capsule, Take by mouth 4 (four) times daily., Disp: , Rfl:    loratadine (CLARITIN) 10 MG tablet, Take 10 mg by mouth daily., Disp: , Rfl:    niraparib tosylate (ZEJULA) 200 MG tablet, Take 1 tablet (200 mg total) by mouth daily. May take at bedtime to reduce nausea and vomiting., Disp: 30 tablet, Rfl: 11   amoxicillin-clavulanate (AUGMENTIN) 875-125 MG tablet, Take 1 tablet by mouth every 12 (twelve) hours for 7 days., Disp: 14 tablet, Rfl: 0   azithromycin (ZITHROMAX) 250 MG tablet, Take 1 tablet (250 mg total) by mouth daily. Take first 2 tablets together, then 1 every day until finished for a total of 5 days, Disp: 6 tablet, Rfl: 0   HYDROcodone-acetaminophen (NORCO/VICODIN) 5-325 MG tablet, Take 1 tablet by mouth 4 (four) times daily., Disp: , Rfl:    ofloxacin (OCUFLOX) 0.3 % ophthalmic solution, Place 1 drop into the left eye 3 (three) times daily., Disp: , Rfl:    ondansetron (ZOFRAN) 4 MG tablet, Take 1-2 tablets (4-8 mg total) by mouth every 8 (eight) hours as needed for nausea or vomiting. (Patient not taking: Reported on 07/09/2023), Disp: 10 tablet, Rfl: 0   prochlorperazine (COMPAZINE) 10 MG tablet, Take 1 tablet (10 mg total) by mouth every 6  (six) hours as needed for nausea or vomiting. (Patient not taking: Reported on 07/09/2023), Disp: 30 tablet, Rfl: 1  Review of Systems: Denies appetite changes, fevers, chills, fatigue, unexplained weight changes. Denies hearing loss, neck lumps or masses, mouth sores, ringing in ears or voice changes. Denies cough or wheezing.  Denies shortness of breath. Denies chest pain or palpitations. Denies leg swelling. Denies abdominal distention, pain, blood in stools, constipation, diarrhea, nausea, vomiting, or  early satiety. Denies pain with intercourse, dysuria, frequency, hematuria or incontinence. Denies hot flashes, pelvic pain, vaginal bleeding or vaginal discharge.   Denies joint pain, back pain or muscle pain/cramps. Denies itching, rash, or wounds. Denies dizziness, headaches, numbness or seizures. Denies swollen lymph nodes or glands, denies easy bruising or bleeding. Denies anxiety, depression, confusion, or decreased concentration.  Physical Exam: BP 128/68 Comment: manual recheck  Pulse 97   Temp 98 F (36.7 C) (Oral)   Resp 18   Wt 115 lb (52.2 kg)   SpO2 100%   BMI 19.74 kg/m  General: Alert, oriented, no acute distress. HEENT: Posterior oropharynx clear, sclera anicteric. Chest: Clear to auscultation bilaterally.  No wheezes or rhonchi. Cardiovascular: Regular rate and rhythm, no murmurs.  Laboratory & Radiologic Studies: None new  Assessment & Plan: Zoe Goonan is a 73 y.o. 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 Initially treated with NACT, R0 IDS in 2022, completed adjuvant chermotherapy in 04/2021.  Recurrence noted in the pelvis, now status post secondary debulking surgery (11/2022) with resection of 3 separate lesions, completely excised.  Completed adjuvant carboplatin and paclitaxel, 6 cycles, 04/2023. Imaging 05/2023 with no metastatic disease. [facility]: ER+, HRD neg, PDL1 CPS 5, P53 abn. ERBB2 negative ([facility] 0). FOLR1 40%  (negative).  The patient is overall doing well, undergoing treatment for pneumonia.   I recently had a long conversation with the patient as she was deciding risks versus benefits of maintenance therapy (see note from 2/14).  Ultimately, the patient elected to proceed with maintenance PARP inhibitor therapy.  She is overall tolerating this well although holding it in the setting of pneumonia.  I will see her back for follow-up in 4 months.  20 minutes of total time was spent for this patient encounter, including preparation, face-to-face counseling with the patient and coordination of care, and documentation of the encounter.  Eugene Garnet, MD  Division of Gynecologic Oncology  Department of Obstetrics and Gynecology  [facility]

## 2023-07-18 ENCOUNTER — Telehealth: Payer: Self-pay

## 2023-07-18 ENCOUNTER — Other Ambulatory Visit: Payer: Self-pay

## 2023-07-18 NOTE — Telephone Encounter (Signed)
 Pt called to ask if she needs to keep her lab and MD appt this week. She says she is recovering from PNA and has not taken Zejula since 07/09/23. The purpose of her appt 3/28 is to discuss medication tolerance and lab f/u, but since she hasn't taken it for 10 days she wants to know if we should r/s. Says she is going to restart today.  Called pt back and LVM advising I would route this question to MD for further recommendation.

## 2023-07-19 NOTE — Telephone Encounter (Signed)
 Called and given appts on 4/4, she is aware of appts.

## 2023-07-19 NOTE — Telephone Encounter (Signed)
 Steward Drone, pls reschedule everything to 4/4

## 2023-07-19 NOTE — Telephone Encounter (Signed)
 Called and left a message asking her to call the office back to reschedule appts.

## 2023-07-20 ENCOUNTER — Other Ambulatory Visit: Payer: Self-pay

## 2023-07-20 ENCOUNTER — Inpatient Hospital Stay

## 2023-07-20 ENCOUNTER — Inpatient Hospital Stay: Admitting: Hematology and Oncology

## 2023-07-20 NOTE — Progress Notes (Signed)
 Specialty Pharmacy Refill Coordination Note  Robin Sellers is a 73 y.o. female contacted today regarding refills of specialty medication(s) Niraparib Tosylate [name])   Patient requested [name] at [facility] at [place] date: 07/27/23   Medication will be filled on 04.03.25.

## 2023-07-23 ENCOUNTER — Ambulatory Visit
Admission: RE | Admit: 2023-07-23 | Discharge: 2023-07-23 | Disposition: A | Discharge status: Home / Self Care | Source: Ambulatory Visit | Attending: Internal Medicine | Admitting: Internal Medicine

## 2023-07-23 ENCOUNTER — Other Ambulatory Visit: Payer: Self-pay | Admitting: Internal Medicine

## 2023-07-23 DIAGNOSIS — R059 Cough, unspecified: Secondary | ICD-10-CM

## 2023-07-26 ENCOUNTER — Other Ambulatory Visit: Payer: Self-pay

## 2023-07-26 ENCOUNTER — Inpatient Hospital Stay: Attending: Hematology and Oncology | Admitting: Internal Medicine

## 2023-07-26 VITALS — BP 138/67 | HR 100 | Temp 98.7°F | Resp 16 | Wt 114.7 lb

## 2023-07-26 DIAGNOSIS — D701 Agranulocytosis secondary to cancer chemotherapy: Secondary | ICD-10-CM | POA: Diagnosis not present

## 2023-07-26 DIAGNOSIS — G44209 Tension-type headache, unspecified, not intractable: Secondary | ICD-10-CM | POA: Diagnosis not present

## 2023-07-26 DIAGNOSIS — C786 Secondary malignant neoplasm of retroperitoneum and peritoneum: Secondary | ICD-10-CM | POA: Insufficient documentation

## 2023-07-26 DIAGNOSIS — D6481 Anemia due to antineoplastic chemotherapy: Secondary | ICD-10-CM | POA: Diagnosis not present

## 2023-07-26 DIAGNOSIS — T451X5A Adverse effect of antineoplastic and immunosuppressive drugs, initial encounter: Secondary | ICD-10-CM | POA: Diagnosis not present

## 2023-07-26 DIAGNOSIS — Z9079 Acquired absence of other genital organ(s): Secondary | ICD-10-CM | POA: Diagnosis not present

## 2023-07-26 DIAGNOSIS — Z8543 Personal history of malignant neoplasm of ovary: Secondary | ICD-10-CM | POA: Insufficient documentation

## 2023-07-26 DIAGNOSIS — Z90710 Acquired absence of both cervix and uterus: Secondary | ICD-10-CM | POA: Insufficient documentation

## 2023-07-26 NOTE — Progress Notes (Signed)
 [facility] at [facility] [address]  [place], [place] [address] [phone]   New Patient Evaluation  Date of Service: 07/26/23 Patient Name: Robin Sellers Patient MRN: [id number] Patient DOB: [birth year] Provider: Henreitta Leber, MD  Identifying Statement:  Robin Sellers is a 73 y.o. female with Tension-type headache, not intractable, unspecified chronicity pattern who presents for initial consultation and evaluation regarding cancer associated neurologic deficits.    Referring Provider: Burton Apley, MD [address] [place],  [place] [address]  Primary Cancer:  Oncologic History: Oncology History Overview Note  Neg genetics,  Molecular testing through [facility] in 2024:  HER2/neu 0, negative F0 LR 2+, 40%, negative PD-L1 CPS score 5% ER +75% HRD negative   Primary peritoneal carcinomatosis (HCC)  08/21/2020 Initial Diagnosis   The patient reports intermittent right upper quadrant pain that feel like she pulled a muscle beginning in March 2022.  In April, she woke up with sharp pain in her right upper quadrant.  This was her first episode of sharp pain.  She thought that her pain was related to her gallbladder and presented to the emergency department.  She was seen in ED on 4/30. RUQ ultrasound and x-ray were normal as were LFTs and lipase.    10/27/2020 Imaging   Findings highly suspicious for peritoneal carcinomatosis. No site of primary malignancy identified.   Colonic diverticulosis, without radiographic evidence of diverticulitis   11/02/2020 Initial Diagnosis   Primary peritoneal carcinomatosis (HCC)   11/02/2020 Tumor Marker   Patient's tumor was tested for the following markers: CA-125. Results of the tumor marker test revealed 88.   11/11/2020 Pathology Results   FINAL MICROSCOPIC DIAGNOSIS:   A. CUL DE SAC, LEFT ANTERIOR, EXCISION:  -  Poorly differentiated carcinoma  -  See comment   B. SIDEWALL, RIGHT,  BIOPSY:  -  Poorly differentiated carcinoma  -  See comment   COMMENT:   By immunohistochemistry, the neoplastic cells are positive for cytokeratin 7, p53, PAX8 and WT1 but negative for cytokeratin 20, GATA3, CDX2 and TTF-1.  Overall, the immunophenotype is consistent with a  gynecologic primary.    11/11/2020 Pathology Results   FINAL MICROSCOPIC DIAGNOSIS:  - Malignant cells consistent with metastatic adenocarcinoma    11/11/2020 Surgery   Pre-operative Diagnosis: Carcinomatosis, mildly elevated CA-125   Post-operative Diagnosis: same, At least stage IIIC gyn malignancy   Operation: Diagnostic laparoscopy, peritoneal biopsies    Surgeon: Eugene Garnet MD Operative Findings: On EUA, small mobile uterus. On intra-abdominal entry, carcinomatosis appreciated studding the right anterior diaphragm, bilateral liver surfaces, mesentery, pelvic peritoneum. Omental cake with measuring up to 2x3cm. Right ovary adherent to the right uterus with peritoneal studding adjacent. Frondular implants noted along right pelvic sidewall. Miliary disease along anterior cul de sac. Cul de sac covered with friable miliary disease. Small volume dark amber ascites. Specimens: left anterior cul de sac peritoneum, right pelvic sidewall peritoneal biopsy          11/15/2020 Cancer Staging   Staging form: Ovary, Fallopian Tube, and Primary Peritoneal Carcinoma, AJCC 8th Edition - Clinical stage from 11/15/2020: FIGO Stage III (cT3, cN0, cM0) - Signed by Artis Delay, MD on 11/15/2020 Stage prefix: Initial diagnosis   11/26/2020 - 05/13/2021 Chemotherapy   Patient is on Treatment Plan : OVARIAN Carboplatin (AUC 6) / Paclitaxel (175) q21d x 6 cycles     11/26/2020 Tumor Marker   Patient's tumor was tested for the following markers: CA-125. Results of  the tumor marker test revealed 76.8.   12/17/2020 Tumor Marker   Patient's tumor was tested for the following markers: CA-125. Results of the tumor marker test revealed  58.1.   01/07/2021 Tumor Marker   Patient's tumor was tested for the following markers: CA-125. Results of the tumor marker test revealed 21.6.   01/20/2021 Imaging   Decreased omental soft tissue nodularity and caking, consistent with interval improvement in carcinoma.   No evidence of new or progressive disease within the abdomen or pelvis.   Colonic diverticulosis. No radiographic evidence of diverticulitis.   Large stool burden noted; recommend clinical correlation for possible constipation   02/24/2021 Surgery   Robotic-assisted laparoscopic total hysterectomy with bilateral salpingoophorectomy, peritoneal stripping, mini-lap for omentectomy and intra-abdominal palpation  Findings: On EUA, small mobile uterus. Rectum free. On intra-abdominal entry, normal upper abdominal survey including liver edge, diaphragm and stomach. Omentum with several small (<2cm) areas of thickening, suspected treated tumor versus residual tumor. Uterus 6cm and normal in appearance. Atrophic appearing bilateral adnexa. Some adhesions of the left ovary to the medial leaf of the broad ligament. Minimal peritoneal nodularity versus treated disease within posterior cul de sac and deep left pelvis, all either excised or ablated. No significant peritoneal disease or carcinomatosis. No sigmoid or rectal involvement. Small bowel normal in appearance. No adenopathy. No ascites. R0 resection.    02/24/2021 Pathology Results   A. PERTIONEAL SCAR, EXCISION:  - Microscopic focus of high-grade serous carcinoma   B. 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 OMENTUM, RESECTION:  - Microscopic foci of high-grade serous carcinoma   03/14/2021 Tumor Marker   Patient's tumor was tested for the  following markers: CA-125. Results of the tumor marker test revealed 32.7.   03/23/2021 Imaging   Unremarkable right upper quadrant ultrasound   04/01/2021 Genetic Testing   HRD status through [employer] was negative.  The report date is 04/01/2021. Negative hereditary cancer genetic testing: no pathogenic variants detected in [employer] [facility].  The report date is 04/04/2021.  The [facility] gene panel offered by [employer] includes sequencing and deletion/duplication testing of the following 48 genes: APC, ATM, AXIN2, BAP1, BARD1, BMPR1A, BRCA1, BRCA2, BRIP1, CHD1, CDK4, CDKN2A(p16 and p14ARF), CHEK2, CTNNA1, EGFR, EPCAM, FH, FLCN, GREM1, HOXB13, MEN1, MET, MITF , MLH1, MSH2, MSH3, MSH6, MUTYH, NTHL1, PALB2, PMS2, POLD1, POLE, PTEN, RAD51C, RAD51D, RET, SDHA, SDHB, SDHC, SDHD, SMAD4, STK11,TERT, TP53, TSC1, TSC2, and VHL.   04/12/2021 Tumor Marker   Patient's tumor was tested for the following markers: CA-125. Results of the tumor marker test revealed 12.7.   06/13/2021 Imaging   1. No new mass or lymphadenopathy identified in the abdomen or pelvis. Interval resolution of previous omental soft tissue caking. 2. Other ancillary findings.   06/13/2021 Tumor Marker   Patient's tumor was tested for the following markers: CA-125. Results of the tumor marker test revealed 10.1.   07/11/2021 Tumor Marker   Patient's tumor was tested for the following markers: CA-125. Results of the tumor marker test revealed 9.4.   09/08/2021 Imaging   1. Status post hysterectomy and omentectomy. 2. No evidence of lymphadenopathy or metastatic disease in the abdomen or pelvis.     09/08/2021 Tumor Marker   Patient's tumor was tested for the following markers:  CA-125. Results of the tumor marker test revealed 9.0.   02/22/2022 Imaging   IMPRESSION: 1. No acute findings.  No ascites, mass or adenopathy. 2.  Aortic Atherosclerosis (ICD10-170.0).   03/10/2022 Tumor Marker   Patient's tumor was  tested for the following markers: CA-125. Results of the tumor marker test revealed 11.7.   06/09/2022 Tumor Marker   Patient's tumor was tested for the following markers: CA-125. Results of the tumor marker test revealed 29.2.   09/07/2022 Tumor Marker   Patient's tumor was tested for the following markers: CA-125. Results of the tumor marker test revealed 39.7.   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 RADIATION DOSE REDUCTION: This exam was performed according to the departmental dose-optimization program which includes automated exposure control, adjustment of the mA and/or kV according to patient size and/or use of iterative reconstruction technique. CONTRAST:  60mL OMNIPAQUE IOHEXOL 350 MG/ML SOLN COMPARISON:  06/22/2022 and previous FINDINGS: Lower chest: No pleural or pericardial effusion. Visualized lung bases grossly clear, with some motion degradation. Hepatobiliary: No focal liver abnormality is seen. No gallstones, gallbladder wall thickening, or biliary dilatation. Pancreas: Unremarkable. No pancreatic ductal dilatation or surrounding inflammatory changes. Spleen: Normal in size without focal abnormality. Accessory splenule. Adrenals/Urinary Tract: No adrenal mass. Symmetric renal parenchymal enhancement without urolithiasis or hydronephrosis. Urinary bladder incompletely distended. Stomach/Bowel: Stomach incompletely distended, unremarkable. Small bowel decompressed. Post appendectomy. Gas and fecal material partially distend the colon, without acute finding. A few scattered diverticula in the sigmoid segment. Vascular/Lymphatic: Minimal calcified aortic plaque without aneurysm or stenosis. Portal  vein patent. A few scattered mesenteric lymph nodes none greater than 4 mm short axis diameter. No abdominal or pelvic adenopathy. Reproductive: Status post hysterectomy. No adnexal masses. No peritoneal nodularity. Other: No ascites. No free air. Bilateral pelvic phleboliths stable. Musculoskeletal: Stable lower lumbar spondylitic change with early grade and grade 1 anterolisthesis L4-5 and degenerative disc disease L5-S1. No fracture or worrisome bone lesion. IMPRESSION: 1. No acute findings. 2. No evidence of recurrent or metastatic disease. 3. Sigmoid diverticulosis. Electronically Signed   By: Corlis Leak M.D.   On: 09/15/2022 20:09      11/24/2022 Tumor Marker   Patient's tumor was tested for the following markers: CA-125. Results of the tumor marker test revealed 47.6.   12/04/2022 Imaging   CT A/P 1. A new 2.4 x 2.1 cm low attenuating structure in the right pelvis abutting the vaginal cuff concerning for recurrent disease, given rise in CA 125. Clinical correlation is recommended. 2. No bowel obstruction. Appendectomy. 3.  Aortic Atherosclerosis (ICD10-I70.0).   12/20/2022 Surgery   Robotic-assisted excision of pelvic mass requiring colpotomy with vaginal cuff closure [name]), excision of left para-colic gutter implant (White), low anterior resection with reanastomosis [name]), cystoscopy [name])   Findings: On EUA, 2-3 cm mass is palpable in the vaginal apex, and close proximity to the rectum but without direct involvement.  Intra-abdominal entry, normal-appearing diaphragm, liver edge, and stomach.  No peritoneal disease or implants noted.  Normal-appearing small bowel.  Surgically absent uterus, cervix, fallopian tubes and ovaries.  Small, 5 mm cystic nodule noted along the left paracolic gutter lateral to the sigmoid colon (excised).  Within the pelvis, there was a 3 cm cystic mass, apparently bilobed,  adherent to the posterior aspect of the vaginal cuff in the midline into the right.  This  was close to but not directly involving the rectum nor much of the rectal mesentery.  There was minimal spillage from the cystic lesion during manipulation to excise the specimen.  Upon resection of this area, the more superior aspect of the rectum was identified as having a similar appearing 1-1.5 cm cystic mass involving the rectum and adjacent mesentery.  The entire lesion was removed within the segment of rectosigmoid colon.  No additional implants or findings concerning for metastatic disease were noted in the abdomen or pelvis. R0 resection. On cystoscopy, bladder dome intact. Good efflux noted from bilateral ureteral orifices.   12/20/2022 Pathology Results   A. PELVIC MASS, EXCISION:  - High-grade serous carcinoma, involving vaginal mucosa and adjacent  soft tissue   B. LEFT PERICOLIC GUTTER NODULE, EXCISION:  - High-grade serous carcinoma  - Resection margin is negative for carcinoma   C. RECTOSIGMOID COLON, RESECTION:  - High-grade serous carcinoma involving segment of rectosigmoid colon  - Lymph node, negative for carcinoma (0/1)  - Resection margins, negative for carcinoma   D. FINAL DISTAL MARGIN OF ANASTOMOTIC RING, EXCISION:  - Colonic donut, negative for carcinoma   Cytology FINAL MICROSCOPIC DIAGNOSIS:  - Malignant cells present  - See comment     01/19/2023 - 05/04/2023 Chemotherapy   Patient is on Treatment Plan : OVARIAN Carboplatin (AUC 6) + Paclitaxel (175) q21d X 6 Cycles     01/19/2023 Tumor Marker   Patient's tumor was tested for the following markers: CA-125. Results of the tumor marker test revealed 11.2.   02/12/2023 Tumor Marker   Patient's tumor was tested for the following markers: CA-125. Results of the tumor marker test revealed 9.9.   03/05/2023 Tumor Marker   Patient's tumor was tested for the following markers: CA-125. Results of the tumor marker test revealed 11.7.   03/21/2023 Tumor Marker   Patient's tumor was tested for the following  markers: CA-125. Results of the tumor marker test revealed 10.2.   04/16/2023 Tumor Marker   Patient's tumor was tested for the following markers: CA-125. Results of the tumor marker test revealed 10.4.   06/04/2023 Imaging   CT ABDOMEN PELVIS W CONTRAST Result Date: 06/05/2023 CLINICAL DATA:  History of ovarian carcinoma, assess treatment response EXAM: CT ABDOMEN AND PELVIS WITH CONTRAST TECHNIQUE: Multidetector CT imaging of the abdomen and pelvis was performed using the standard protocol following bolus administration of intravenous contrast. RADIATION DOSE REDUCTION: This exam was performed according to the departmental dose-optimization program which includes automated exposure control, adjustment of the mA and/or kV according to patient size and/or use of iterative reconstruction technique. CONTRAST:  OMNIPAQUE IOHEXOL 300 MG/ML  SOLN COMPARISON:  12/04/2022 and previous FINDINGS: Lower chest: No pleural or pericardial effusion. Visualized lung bases clear. Hepatobiliary: No focal liver abnormality is seen. No gallstones, gallbladder wall thickening, or biliary dilatation. Pancreas: Unremarkable. No pancreatic ductal dilatation or surrounding inflammatory changes. Spleen: Normal in size without focal abnormality. Small accessory splenule, stable. Adrenals/Urinary Tract: No adrenal mass. Symmetric renal parenchymal enhancement without focal lesion, hydronephrosis, or evident urolithiasis. Urinary bladder nondistended. Stomach/Bowel: Stomach incompletely distended, unremarkable. Small bowel nondistended. Post appendectomy. The colon is partially distended. Anastomotic staple line at the rectosigmoid junction. Vascular/Lymphatic: Minimal calcified aortic plaque without aneurysm. Patent portal and renal veins. No abdominal or pelvic adenopathy. Reproductive: Status post hysterectomy. No adnexal masses. The cystic process previously described in the region  of the vaginal cuff on the right is no  longer visualized. Other: Left pelvic phlebolith.  No ascites.  No free air. Musculoskeletal: Mild spondylitic changes throughout the lumbar spine. Grade 1 anterolisthesis L4-5 attributed to bilateral facet DJD. IMPRESSION: 1. No acute findings. 2. Interval resolution of the cystic process at the vaginal cuff. 3. No evidence of recurrent or metastatic disease. Electronically Signed   By: Corlis Leak M.D.   On: 06/05/2023 13:20      06/05/2023 Tumor Marker   Patient's tumor was tested for the following markers: CA-125. Results of the tumor marker test revealed 9.5.   06/30/2023 Tumor Marker   Patient's tumor was tested for the following markers: CA-125. Results of the tumor marker test revealed 8.3.   07/10/2023 Tumor Marker   Patient's tumor was tested for the following markers: CA-`125. Results of the tumor marker test revealed 10.7.     History of Present Illness: The patient's records from the referring physician were obtained and reviewed and the patient interviewed to confirm this HPI.  Robin Sellers presents to review headache symptoms.  She describes "tightening type pain or discomfort" in the left side of her head.  Frequency is less than daily, duration is minutes to hours, more often at night.  No fulminant migraine, photophobia or nausea.  She does have a history of migraines from her 60's and 30's.  Onset was several months ago during chemotherapy for ovarian cancer.  She is now on an oral agent.  Sleep is normal, pain medication use is sporadic.  Eyeglasses are out of date, she is squinting a lot.   Medications: Current Outpatient Medications on File Prior to Visit  Medication Sig Dispense Refill   azithromycin (ZITHROMAX) 250 MG tablet Take 1 tablet (250 mg total) by mouth daily. Take first 2 tablets together, then 1 every day until finished for a total of 5 days 6 tablet 0   Biotin 5000 MCG CHEW Chew 5,000 mcg by mouth daily.     HYDROcodone-acetaminophen (NORCO/VICODIN)  5-325 MG tablet Take 1 tablet by mouth 4 (four) times daily.     loratadine (CLARITIN) 10 MG tablet Take 10 mg by mouth daily.     niraparib tosylate (ZEJULA) 200 MG tablet Take 1 tablet (200 mg total) by mouth daily. May take at bedtime to reduce nausea and vomiting. 30 tablet 11   ofloxacin (OCUFLOX) 0.3 % ophthalmic solution Place 1 drop into the left eye 3 (three) times daily.     ondansetron (ZOFRAN) 4 MG tablet Take 1-2 tablets (4-8 mg total) by mouth every 8 (eight) hours as needed for nausea or vomiting. (Patient not taking: Reported on 07/09/2023) 10 tablet 0   prochlorperazine (COMPAZINE) 10 MG tablet Take 1 tablet (10 mg total) by mouth every 6 (six) hours as needed for nausea or vomiting. (Patient not taking: Reported on 07/09/2023) 30 tablet 1   No current facility-administered medications on file prior to visit.    Allergies:  Allergies  Allergen Reactions   Artichoke [Cynara Scolymus (Artichoke)] Rash   Past Medical History:  Past Medical History:  Diagnosis Date   Anemia    Cancer (HCC)    ovarian   Family history of breast cancer 03/14/2021   Hole, retinal    Right eye   Osteoporosis    Polyp of colon, unspecified part of colon, unspecified type    Benign   Past Surgical History:  Past Surgical History:  Procedure Laterality Date   APPENDECTOMY  02/27/2007   [facility]   BILATERAL SALPINGECTOMY Right 03/1978   right ectopic, tied other tube   BOWEL RESECTION N/A 12/20/2022   Procedure: RECTOSIGMOID RESECTION AND REANASTAMOSIS, MINI LAPAROTOMY;  Surgeon: [name], MD;  Location: [facility];  Service: Gynecology;  Laterality: N/A;   CATARACT EXTRACTION Right 10/31/2011   Dr. Dione Booze   COLONOSCOPY  03/2019   Dr. Levora Angel   COLONOSCOPY WITH PROPOFOL N/A 04/01/2018   Procedure: COLONOSCOPY WITH PROPOFOL;  Surgeon: Kathi Der, MD;  Location: [facility];  Service: Gastroenterology;  Laterality: N/A;   CYSTOSCOPY  12/20/2022   Procedure: CYSTOSCOPY;  Surgeon:  [name], MD;  Location: [facility];  Service: Gynecology;;   DILATION AND CURETTAGE OF UTERUS     x3, 2 for miscarriages, one for polyp vs fibroid   HAND / FINGER LESION EXCISION Left    lt. index finger   LAPAROSCOPIC HYSTERECTOMY     LAPAROSCOPY N/A 11/11/2020   Procedure: LAPAROSCOPY DIAGNOSTIC WITH PERITONEAL BIOPSY;  Surgeon: [name], MD;  Location: [facility];  Service: Gynecology;  Laterality: N/A;   POLYPECTOMY  04/01/2018   Procedure: POLYPECTOMY;  Surgeon: Kathi Der, MD;  Location: [facility];  Service: Gastroenterology;;   RETINAL TEAR REPAIR CRYOTHERAPY Right 04/25/2011   Dr. Luciana Axe   ROBOT ASSISTED MYOMECTOMY N/A 12/20/2022   Procedure: DIAGNOSTIC LAPAROSCOPY, XI ROBOTIC ASSISTED PELVIC MASS EXCISION;  Surgeon: [name], MD;  Location: [facility];  Service: Gynecology;  Laterality: N/A;   SUBMUCOSAL INJECTION  04/01/2018   Procedure: SUBMUCOSAL INJECTION;  Surgeon: Kathi Der, MD;  Location: [facility];  Service: Gastroenterology;;   TUBAL LIGATION Left 03/1978   Social History:  Social History   Socioeconomic History   Marital status: Legally Separated    Spouse name: Not on file   Number of children: Not on file   Years of education: Not on file   Highest education level: Not on file  Occupational History   Not on file  Tobacco Use   Smoking status: Never    Passive exposure: Past (18 years)   Smokeless tobacco: Never  Vaping Use   Vaping status: Never Used  Substance and Sexual Activity   Alcohol use: Not Currently   Drug use: Never   Sexual activity: Not Currently  Other Topics Concern   Not on file  Social History Narrative   Not on file   Social Drivers of Health   Financial Resource Strain: Not on file  Food Insecurity: No Food Insecurity (12/21/2022)   Hunger Vital Sign    Worried About Running Out of Food in the Last Year: Never true    Ran Out of Food in the Last Year: Never true  Transportation Needs:  No Transportation Needs (12/21/2022)   PRAPARE - Administrator, Civil Service (Medical): No    Lack of Transportation (Non-Medical): No  Physical Activity: Not on file  Stress: Not on file  Social Connections: Not on file  Intimate Partner Violence: Not At Risk (12/21/2022)   Humiliation, Afraid, Rape, and Kick questionnaire    Fear of Current or Ex-Partner: No    Emotionally Abused: No    Physically Abused: No    Sexually Abused: No   Family History:  Family History  Problem Relation Age of Onset   Lung cancer Mother 52       smoking hx   Skin cancer Father        dx after 50; non-melanoma; scalp   Lung  cancer Maternal Aunt        dx after 50; smoking hx   Breast cancer Daughter 40       ER+   Skin cancer Cousin        face; surgery only   Ovarian cancer Neg Hx    Endometrial cancer Neg Hx    Prostate cancer Neg Hx    Pancreatic cancer Neg Hx    Colon cancer Neg Hx     Review of Systems: Constitutional: Doesn't report fevers, chills or abnormal weight loss Eyes: Doesn't report blurriness of vision Ears, nose, mouth, throat, and face: Doesn't report sore throat Respiratory: Doesn't report cough, dyspnea or wheezes Cardiovascular: Doesn't report palpitation, chest discomfort  Gastrointestinal:  Doesn't report nausea, constipation, diarrhea GU: Doesn't report incontinence Skin: Doesn't report skin rashes Neurological: Per HPI Musculoskeletal: Doesn't report joint pain Behavioral/Psych: Doesn't report anxiety  Physical Exam: Vitals:   07/26/23 0911  BP: 138/67  Pulse: 100  Resp: 16  Temp: 98.7 F (37.1 C)  SpO2: 100%   KPS: 90. General: Alert, cooperative, pleasant, in no acute distress Head: Normal EENT: No conjunctival injection or scleral icterus.  Lungs: Resp effort normal Cardiac: Regular rate Abdomen: Non-distended abdomen Skin: No rashes cyanosis or petechiae. Extremities: No clubbing or edema  Neurologic Exam: Mental Status: Awake,  alert, attentive to examiner. Oriented to self and environment. Language is fluent with intact comprehension.  Cranial Nerves: Visual acuity is grossly normal. Visual fields are full. Extra-ocular movements intact. No ptosis. Face is symmetric Motor: Tone and bulk are normal. Power is full in both arms and legs. Reflexes are symmetric, no pathologic reflexes present.  Sensory: Intact to light touch Gait: Normal.   Labs: I have reviewed the data as listed    Component Value Date/Time   NA 135 07/09/2023 0905   K 4.4 07/09/2023 0905   CL 99 07/09/2023 0905   CO2 30 07/09/2023 0905   GLUCOSE 122 (H) 07/09/2023 0905   BUN 16 07/09/2023 0905   CREATININE 0.92 07/09/2023 0905   CREATININE 0.76 04/13/2023 0828   CALCIUM 9.2 07/09/2023 0905   PROT 7.6 07/09/2023 0905   ALBUMIN 4.1 07/09/2023 0905   AST 15 07/09/2023 0905   AST 17 04/13/2023 0828   ALT 11 07/09/2023 0905   ALT 12 04/13/2023 0828   ALKPHOS 62 07/09/2023 0905   BILITOT 0.8 07/09/2023 0905   BILITOT 0.4 04/13/2023 0828   GFRNONAA >60 07/09/2023 0905   GFRNONAA >60 04/13/2023 0828   GFRAA  02/27/2007 1510    >60        The eGFR has been calculated using the MDRD equation. This calculation has not been validated in all clinical   Lab Results  Component Value Date   WBC 8.9 07/09/2023   NEUTROABS 7.8 (H) 07/09/2023   HGB 12.4 07/09/2023   HCT 36.4 07/09/2023   MCV 90.3 07/09/2023   PLT 154 07/09/2023    Imaging:  DG Chest 2 View Result Date: 07/24/2023 CLINICAL DATA:  Cough EXAM: CHEST - 2 VIEW COMPARISON:  July 10, 2023 FINDINGS: Marked improvement in the previously described right basilar infiltrates. No significant residual infiltrates. Heart and mediastinum normal. Electronically Signed   By: Shaaron Adler M.D.   On: 07/24/2023 10:57   DG Chest 2 View Result Date: 07/10/2023 CLINICAL DATA:  Cough. EXAM: CHEST - 2 VIEW COMPARISON:  Chest radiograph dated 08/21/2020. FINDINGS: Bilateral lower lobe airspace  opacities new since the prior study is and concerning for  developing infiltrate. Clinical correlation and follow-up to resolution recommended. No pleural effusion or pneumothorax. The cardiac silhouette is within normal limits. No acute osseous pathology. IMPRESSION: Bilateral lower lobe infiltrates.  Follow-up recommended. Electronically Signed   By: Elgie Collard M.D.   On: 07/10/2023 10:51     Assessment/Plan Tension-type headache, not intractable, unspecified chronicity pattern  Robin Sellers presents with clinical syndrome c/w tensions headaches.    Etiology is unclear; she does have a history of migraines.  Tensions headaches can result from improper prescription lenses, chronic squinting.  She has no other risk factors for chronic daily headaches.  Do not recommend further workup based on normal CNS exam today, lack of focal neurologic complaints.  She will con't to follow with Dr. Bertis Ruddy otherwise.  We spent twenty additional minutes teaching regarding the natural history, biology, and historical experience in the treatment of neurologic complications of cancer.   We appreciate the opportunity to participate in the care of Robin Sellers.   All questions were answered. The patient knows to call the clinic with any problems, questions or concerns. No barriers to learning were detected.  The total time spent in the encounter was 40 minutes and more than 50% was on counseling and review of test results   Henreitta Leber, MD Medical Director of Neuro-Oncology [facility] [place] 07/26/23 9:06 AM

## 2023-07-27 ENCOUNTER — Other Ambulatory Visit: Payer: Self-pay

## 2023-07-27 ENCOUNTER — Other Ambulatory Visit (HOSPITAL_COMMUNITY): Payer: Self-pay

## 2023-07-27 ENCOUNTER — Inpatient Hospital Stay: Admitting: Hematology and Oncology

## 2023-07-27 ENCOUNTER — Inpatient Hospital Stay

## 2023-07-27 ENCOUNTER — Encounter: Payer: Self-pay | Admitting: Hematology and Oncology

## 2023-07-27 VITALS — BP 146/82 | HR 86 | Temp 97.5°F | Resp 18 | Ht 64.0 in | Wt 113.8 lb

## 2023-07-27 DIAGNOSIS — C482 Malignant neoplasm of peritoneum, unspecified: Secondary | ICD-10-CM | POA: Diagnosis not present

## 2023-07-27 DIAGNOSIS — D701 Agranulocytosis secondary to cancer chemotherapy: Secondary | ICD-10-CM | POA: Insufficient documentation

## 2023-07-27 DIAGNOSIS — T451X5A Adverse effect of antineoplastic and immunosuppressive drugs, initial encounter: Secondary | ICD-10-CM

## 2023-07-27 DIAGNOSIS — R110 Nausea: Secondary | ICD-10-CM | POA: Diagnosis not present

## 2023-07-27 DIAGNOSIS — C786 Secondary malignant neoplasm of retroperitoneum and peritoneum: Secondary | ICD-10-CM | POA: Diagnosis not present

## 2023-07-27 DIAGNOSIS — E860 Dehydration: Secondary | ICD-10-CM | POA: Diagnosis not present

## 2023-07-27 LAB — CBC WITH DIFFERENTIAL/PLATELET
Abs Immature Granulocytes: 0.01 10*3/uL (ref 0.00–0.07)
Basophils Absolute: 0.1 10*3/uL (ref 0.0–0.1)
Basophils Relative: 2 %
Eosinophils Absolute: 0.1 10*3/uL (ref 0.0–0.5)
Eosinophils Relative: 4 %
HCT: 37.6 % (ref 36.0–46.0)
Hemoglobin: 12.6 g/dL (ref 12.0–15.0)
Immature Granulocytes: 0 %
Lymphocytes Relative: 39 %
Lymphs Abs: 1.4 10*3/uL (ref 0.7–4.0)
MCH: 30.7 pg (ref 26.0–34.0)
MCHC: 33.5 g/dL (ref 30.0–36.0)
MCV: 91.7 fL (ref 80.0–100.0)
Monocytes Absolute: 0.4 10*3/uL (ref 0.1–1.0)
Monocytes Relative: 11 %
Neutro Abs: 1.6 10*3/uL — ABNORMAL LOW (ref 1.7–7.7)
Neutrophils Relative %: 44 %
Platelets: 354 10*3/uL (ref 150–400)
RBC: 4.1 MIL/uL (ref 3.87–5.11)
RDW: 12.2 % (ref 11.5–15.5)
WBC: 3.5 10*3/uL — ABNORMAL LOW (ref 4.0–10.5)
nRBC: 0 % (ref 0.0–0.2)

## 2023-07-27 LAB — COMPREHENSIVE METABOLIC PANEL WITH GFR
ALT: 8 U/L (ref 0–44)
AST: 17 U/L (ref 15–41)
Albumin: 4.6 g/dL (ref 3.5–5.0)
Alkaline Phosphatase: 74 U/L (ref 38–126)
Anion gap: 7 (ref 5–15)
BUN: 19 mg/dL (ref 8–23)
CO2: 30 mmol/L (ref 22–32)
Calcium: 9.6 mg/dL (ref 8.9–10.3)
Chloride: 103 mmol/L (ref 98–111)
Creatinine, Ser: 1.08 mg/dL — ABNORMAL HIGH (ref 0.44–1.00)
GFR, Estimated: 55 mL/min — ABNORMAL LOW (ref >60)
Glucose, Bld: 104 mg/dL — ABNORMAL HIGH (ref 70–99)
Potassium: 4 mmol/L (ref 3.5–5.1)
Sodium: 140 mmol/L (ref 135–145)
Total Bilirubin: 0.5 mg/dL (ref 0.0–1.2)
Total Protein: 7.4 g/dL (ref 6.5–8.1)

## 2023-07-27 MED ORDER — NIRAPARIB TOSYLATE 100 MG PO TABS
100.0000 mg | ORAL_TABLET | Freq: Every day | ORAL | 11 refills | Status: AC
  Filled 2023-07-27: qty 30, 30d supply, fill #0
  Filled 2023-08-20: qty 30, 30d supply, fill #1
  Filled 2023-09-12: qty 30, 30d supply, fill #2
  Filled 2023-10-15: qty 30, 30d supply, fill #3
  Filled 2023-11-05 – 2023-11-19 (×2): qty 30, 30d supply, fill #4
  Filled 2023-12-11: qty 30, 30d supply, fill #5
  Filled 2024-01-14: qty 30, 30d supply, fill #6
  Filled 2024-02-13: qty 30, 30d supply, fill #7
  Filled 2024-03-12 – 2024-03-13 (×2): qty 30, 30d supply, fill #8
  Filled 2024-04-07: qty 30, 30d supply, fill #9
  Filled 2024-05-09 – 2024-05-14 (×3): qty 30, 30d supply, fill #10

## 2023-07-27 NOTE — Progress Notes (Signed)
 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  Imaging study in May 2024 showed no evidence of disease but repeat imaging study by August 2024 show evidence of recurrence in the vaginal cuff, amenable to surgical resection The patient is given 6 more cycles of adjuvant chemotherapy with combination of carboplatin and paclitaxel, completed by January 2025  I reviewed CT imaging from February 2025 with the patient which show no evidence of residual disease We discussed the role of maintenance treatment and the patient elected to start niraparib on June 23, 2023 Since the last time I saw her, she developed infection and was prescribed 2 different courses of antibiotics She also developed some nausea Review of her labs today show leukopenia as well as slight dehydration Overall, I believe the current dose of 200 mg is too strong for her I recommend reducing niraparib to 100 mg daily I will see her again in about 2-1/2 weeks I plan to repeat imaging study in May 2025  Leukopenia due to antineoplastic chemotherapy (HCC) This is likely due to recent treatment and related to recent infection Plan dose reduction as above Nausea without vomiting She has antiemetics to take as needed I am hopeful we reduced dose niraparib, she might not  experience much nausea in the future Dehydration We discussed importance of increasing oral fluid intake I recommended patient to try electrolyte drinks as tolerated  No orders of the defined types were placed in this encounter.    Artis Delay, MD  INTERVAL HISTORY: she returns for treatment follow-up Complications related to previous cycle of chemotherapy included leukopenia,, nausea with/without vomiting,, dehydration,, and infection, She was recently treated with antibiotics for pneumonia She has mild intermittent nausea She felt somewhat dehydrated We reviewed test results and discuss dose modification  PHYSICAL EXAMINATION: ECOG PERFORMANCE STATUS: 1 - Symptomatic but completely ambulatory  Vitals:   07/27/23 0853  BP: (!) 146/82  Pulse: 86  Resp: 18  Temp: (!) 97.5 F (36.4 C)  SpO2: 100%   Filed Weights   07/27/23 0853  Weight: 113 lb 12.8 oz (51.6 kg)    Relevant data reviewed during this visit included CBC and CMP

## 2023-07-27 NOTE — Assessment & Plan Note (Addendum)
 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  Imaging study in May 2024 showed no evidence of disease but repeat imaging study by August 2024 show evidence of recurrence in the vaginal cuff, amenable to surgical resection The patient is given 6 more cycles of adjuvant chemotherapy with combination of carboplatin and paclitaxel, completed by January 2025  I reviewed CT imaging from February 2025 with the patient which show no evidence of residual disease We discussed the role of maintenance treatment and the patient elected to start niraparib on June 23, 2023 Since the last time I saw her, she developed infection and was prescribed 2 different courses of antibiotics She also developed some nausea Review of her labs today show leukopenia as well as slight dehydration Overall, I believe the current dose of 200 mg is too strong for her I recommend reducing niraparib to 100 mg daily I will see her again in about 2-1/2 weeks I plan to repeat imaging study in May 2025

## 2023-07-27 NOTE — Assessment & Plan Note (Addendum)
 This is likely due to recent treatment and related to recent infection Plan dose reduction as above

## 2023-07-27 NOTE — Assessment & Plan Note (Addendum)
 We discussed importance of increasing oral fluid intake I recommended patient to try electrolyte drinks as tolerated

## 2023-07-27 NOTE — Progress Notes (Signed)
 Specialty Pharmacy Refill Coordination Note  Dose reduced by provider. New rx in [facility], patient would still like to pick up 07/27/23 or after.

## 2023-07-27 NOTE — Assessment & Plan Note (Addendum)
 She has antiemetics to take as needed I am hopeful we reduced dose niraparib, she might not experience much nausea in the future

## 2023-07-30 ENCOUNTER — Other Ambulatory Visit: Payer: Self-pay

## 2023-07-30 ENCOUNTER — Other Ambulatory Visit (HOSPITAL_COMMUNITY): Payer: Self-pay

## 2023-07-30 ENCOUNTER — Telehealth: Payer: Self-pay | Admitting: Hematology and Oncology

## 2023-07-30 NOTE — Telephone Encounter (Signed)
 Spoke with patient confirming upcoming appointment

## 2023-08-08 ENCOUNTER — Telehealth: Payer: Self-pay | Admitting: *Deleted

## 2023-08-08 NOTE — Telephone Encounter (Signed)
 Per provider moved appt from 8/1 to 8/29. Patient aware of new date/time

## 2023-08-13 ENCOUNTER — Inpatient Hospital Stay

## 2023-08-13 ENCOUNTER — Inpatient Hospital Stay (HOSPITAL_BASED_OUTPATIENT_CLINIC_OR_DEPARTMENT_OTHER): Admitting: Hematology and Oncology

## 2023-08-13 ENCOUNTER — Encounter: Payer: Self-pay | Admitting: Hematology and Oncology

## 2023-08-13 ENCOUNTER — Other Ambulatory Visit: Payer: Self-pay | Admitting: Hematology and Oncology

## 2023-08-13 VITALS — BP 143/77 | HR 88 | Temp 98.0°F | Resp 20 | Ht 64.0 in | Wt 115.8 lb

## 2023-08-13 DIAGNOSIS — C482 Malignant neoplasm of peritoneum, unspecified: Secondary | ICD-10-CM

## 2023-08-13 DIAGNOSIS — T451X5A Adverse effect of antineoplastic and immunosuppressive drugs, initial encounter: Secondary | ICD-10-CM | POA: Insufficient documentation

## 2023-08-13 DIAGNOSIS — C786 Secondary malignant neoplasm of retroperitoneum and peritoneum: Secondary | ICD-10-CM | POA: Diagnosis not present

## 2023-08-13 DIAGNOSIS — D6481 Anemia due to antineoplastic chemotherapy: Secondary | ICD-10-CM | POA: Insufficient documentation

## 2023-08-13 LAB — CBC WITH DIFFERENTIAL/PLATELET
Abs Immature Granulocytes: 0.02 10*3/uL (ref 0.00–0.07)
Basophils Absolute: 0.1 10*3/uL (ref 0.0–0.1)
Basophils Relative: 1 %
Eosinophils Absolute: 0.2 10*3/uL (ref 0.0–0.5)
Eosinophils Relative: 3 %
HCT: 34.4 % — ABNORMAL LOW (ref 36.0–46.0)
Hemoglobin: 11.9 g/dL — ABNORMAL LOW (ref 12.0–15.0)
Immature Granulocytes: 0 %
Lymphocytes Relative: 24 %
Lymphs Abs: 1.5 10*3/uL (ref 0.7–4.0)
MCH: 31.5 pg (ref 26.0–34.0)
MCHC: 34.6 g/dL (ref 30.0–36.0)
MCV: 91 fL (ref 80.0–100.0)
Monocytes Absolute: 0.4 10*3/uL (ref 0.1–1.0)
Monocytes Relative: 6 %
Neutro Abs: 4.3 10*3/uL (ref 1.7–7.7)
Neutrophils Relative %: 66 %
Platelets: 150 10*3/uL (ref 150–400)
RBC: 3.78 MIL/uL — ABNORMAL LOW (ref 3.87–5.11)
RDW: 13.4 % (ref 11.5–15.5)
WBC: 6.5 10*3/uL (ref 4.0–10.5)
nRBC: 0 % (ref 0.0–0.2)

## 2023-08-13 LAB — COMPREHENSIVE METABOLIC PANEL WITH GFR
ALT: 10 U/L (ref 0–44)
AST: 18 U/L (ref 15–41)
Albumin: 4.5 g/dL (ref 3.5–5.0)
Alkaline Phosphatase: 50 U/L (ref 38–126)
Anion gap: 6 (ref 5–15)
BUN: 28 mg/dL — ABNORMAL HIGH (ref 8–23)
CO2: 31 mmol/L (ref 22–32)
Calcium: 9.5 mg/dL (ref 8.9–10.3)
Chloride: 102 mmol/L (ref 98–111)
Creatinine, Ser: 0.99 mg/dL (ref 0.44–1.00)
GFR, Estimated: >60 mL/min (ref >60)
Glucose, Bld: 159 mg/dL — ABNORMAL HIGH (ref 70–99)
Potassium: 4.2 mmol/L (ref 3.5–5.1)
Sodium: 139 mmol/L (ref 135–145)
Total Bilirubin: 0.6 mg/dL (ref 0.0–1.2)
Total Protein: 6.8 g/dL (ref 6.5–8.1)

## 2023-08-13 NOTE — Assessment & Plan Note (Addendum)
 This is likely due to recent treatment. The patient denies recent history of bleeding such as epistaxis, hematuria or hematochezia. She is asymptomatic from the anemia. I will observe for now.  She does not require transfusion now. I will continue the chemotherapy at current dose without dosage adjustment.  If the anemia gets progressive worse in the future, I might have to delay her treatment or adjust the chemotherapy dose.

## 2023-08-13 NOTE — Assessment & Plan Note (Addendum)
 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  Imaging study in May 2024 showed no evidence of disease but repeat imaging study by August 2024 show evidence of recurrence in the vaginal cuff, amenable to surgical resection The patient is given 6 more cycles of adjuvant chemotherapy with combination of carboplatin  and paclitaxel , completed by January 2025  I reviewed CT imaging from February 2025 with the patient which show no evidence of residual disease  She started niraparib  on June 23, 2023 Due to side effects, niraparib  dose was reduced to 100 mg which she tolerated well She will come in her treatment for now at same dose I plan to repeat imaging study in June 2025

## 2023-08-13 NOTE — Progress Notes (Signed)
 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  Imaging study in May 2024 showed no evidence of disease but repeat imaging study by August 2024 show evidence of recurrence in the vaginal cuff, amenable to surgical resection The patient is given 6 more cycles of adjuvant chemotherapy with combination of carboplatin  and paclitaxel , completed by January 2025  I reviewed CT imaging from February 2025 with the patient which show no evidence of residual disease  She started niraparib  on June 23, 2023 Due to side effects, niraparib  dose was reduced to 100 mg which she tolerated well She will come in her treatment for now at same dose I plan to repeat imaging study in June 2025  Anemia due to antineoplastic chemotherapy This is likely due to recent treatment. The patient denies recent history of bleeding such as epistaxis, hematuria or hematochezia. She is asymptomatic from the anemia. I will observe for now.  She does not require transfusion now. I will continue the chemotherapy at current dose without dosage adjustment.  If the anemia gets progressive worse in the future, I might have to delay her treatment or adjust the chemotherapy dose.   Orders Placed This Encounter  Procedures   CT ABDOMEN PELVIS W CONTRAST    Standing Status:    Future    Expected Date:   09/25/2023    Expiration Date:   08/12/2024    Scheduling Instructions:     No need oral contrast    If indicated for the ordered procedure, I authorize the administration of contrast media per Radiology protocol:   Yes    Does the patient have a contrast media/X-ray dye allergy?:   No    Preferred imaging location?:   [facility]    If indicated for the ordered procedure, I authorize the administration of oral contrast media per Radiology protocol:   No    Reason for no oral contrast::   No need oral contrast     Almeda Jacobs, MD  INTERVAL HISTORY: she returns for treatment follow-up on niraparib  She tolerated reduced dose well No recent infection Nausea has resolved Energy level is good and she started to eat better and has gained some weight We discussed test results and future follow-up including imaging study Complications related to previous cycle of chemotherapy included anemia,  PHYSICAL EXAMINATION: ECOG PERFORMANCE STATUS: 0 - Asymptomatic  Vitals:   08/13/23 1451  BP: (!) 143/77  Pulse: 88  Resp: 20  Temp: 98 F (36.7 C)  SpO2: 100%   Filed Weights   08/13/23 1451  Weight: 115 lb 12.8 oz (52.5 kg)    Relevant data reviewed during this visit included CBC and CMP

## 2023-08-14 ENCOUNTER — Other Ambulatory Visit: Payer: Self-pay

## 2023-08-20 ENCOUNTER — Other Ambulatory Visit (HOSPITAL_COMMUNITY): Payer: Self-pay

## 2023-08-20 ENCOUNTER — Other Ambulatory Visit: Payer: Self-pay

## 2023-08-20 NOTE — Progress Notes (Signed)
 Specialty Pharmacy Ongoing Clinical Assessment Note  Robin Sellers is a 73 y.o. female who is being followed by the specialty pharmacy service for RxSp Oncology   Patient's specialty medication(s) reviewed today: Niraparib  Tosylate (ZEJULA )   Missed doses in the last 4 weeks: 0   Patient/Caregiver did not have any additional questions or concerns.   Therapeutic benefit summary: Unable to assess   Adverse events/side effects summary: No adverse events/side effects   Patient's therapy is appropriate to: Continue    Goals Addressed             This Visit's Progress    Maintain optimal adherence to therapy   No change    Patient is initiating therapy. Patient will maintain adherence         Follow up:  3 months  Malachi Screws Specialty Pharmacist

## 2023-08-20 NOTE — Progress Notes (Signed)
 Specialty Pharmacy Refill Coordination Note  Robin Sellers is a 73 y.o. female contacted today regarding refills of specialty medication(s) Niraparib  Tosylate (ZEJULA )   Patient requested [facility] [place] date: 08/22/23   Medication will be filled on 08/21/23.

## 2023-08-21 ENCOUNTER — Other Ambulatory Visit: Payer: Self-pay

## 2023-08-28 ENCOUNTER — Ambulatory Visit
Admission: RE | Admit: 2023-08-28 | Discharge: 2023-08-28 | Discharge status: Home / Self Care | Source: Ambulatory Visit | Attending: Internal Medicine | Admitting: Internal Medicine

## 2023-08-28 DIAGNOSIS — Z1231 Encounter for screening mammogram for malignant neoplasm of breast: Secondary | ICD-10-CM

## 2023-08-29 ENCOUNTER — Other Ambulatory Visit: Payer: Self-pay | Admitting: Internal Medicine

## 2023-08-29 DIAGNOSIS — M810 Age-related osteoporosis without current pathological fracture: Secondary | ICD-10-CM

## 2023-09-12 ENCOUNTER — Other Ambulatory Visit: Payer: Self-pay

## 2023-09-12 NOTE — Progress Notes (Signed)
 Specialty Pharmacy Refill Coordination Note  Robin Sellers is a 73 y.o. female contacted today regarding refills of specialty medication(s) Niraparib  Tosylate (ZEJULA )   Patient requested (Patient-Rptd) Pickup at [facility] date: (Patient-Rptd) 09/20/23   Medication will be filled on 09/19/23.

## 2023-09-25 ENCOUNTER — Inpatient Hospital Stay: Attending: Hematology and Oncology

## 2023-09-25 ENCOUNTER — Ambulatory Visit (HOSPITAL_COMMUNITY)
Admission: RE | Admit: 2023-09-25 | Discharge: 2023-09-25 | Disposition: A | Discharge status: Home / Self Care | Source: Ambulatory Visit | Attending: Hematology and Oncology | Admitting: Hematology and Oncology

## 2023-09-25 DIAGNOSIS — C482 Malignant neoplasm of peritoneum, unspecified: Secondary | ICD-10-CM | POA: Insufficient documentation

## 2023-09-25 DIAGNOSIS — C786 Secondary malignant neoplasm of retroperitoneum and peritoneum: Secondary | ICD-10-CM | POA: Insufficient documentation

## 2023-09-25 DIAGNOSIS — Z8543 Personal history of malignant neoplasm of ovary: Secondary | ICD-10-CM | POA: Diagnosis present

## 2023-09-25 LAB — CBC WITH DIFFERENTIAL/PLATELET
Abs Immature Granulocytes: 0.02 10*3/uL (ref 0.00–0.07)
Basophils Absolute: 0 10*3/uL (ref 0.0–0.1)
Basophils Relative: 1 %
Eosinophils Absolute: 0.1 10*3/uL (ref 0.0–0.5)
Eosinophils Relative: 1 %
HCT: 37.7 % (ref 36.0–46.0)
Hemoglobin: 12.9 g/dL (ref 12.0–15.0)
Immature Granulocytes: 0 %
Lymphocytes Relative: 16 %
Lymphs Abs: 1.2 10*3/uL (ref 0.7–4.0)
MCH: 31.5 pg (ref 26.0–34.0)
MCHC: 34.2 g/dL (ref 30.0–36.0)
MCV: 92.2 fL (ref 80.0–100.0)
Monocytes Absolute: 0.3 10*3/uL (ref 0.1–1.0)
Monocytes Relative: 5 %
Neutro Abs: 5.7 10*3/uL (ref 1.7–7.7)
Neutrophils Relative %: 77 %
Platelets: 157 10*3/uL (ref 150–400)
RBC: 4.09 MIL/uL (ref 3.87–5.11)
RDW: 13.1 % (ref 11.5–15.5)
WBC: 7.4 10*3/uL (ref 4.0–10.5)
nRBC: 0 % (ref 0.0–0.2)

## 2023-09-25 LAB — COMPREHENSIVE METABOLIC PANEL WITH GFR
ALT: 14 U/L (ref 0–44)
AST: 21 U/L (ref 15–41)
Albumin: 4.6 g/dL (ref 3.5–5.0)
Alkaline Phosphatase: 50 U/L (ref 38–126)
Anion gap: 6 (ref 5–15)
BUN: 20 mg/dL (ref 8–23)
CO2: 30 mmol/L (ref 22–32)
Calcium: 9 mg/dL (ref 8.9–10.3)
Chloride: 103 mmol/L (ref 98–111)
Creatinine, Ser: 0.88 mg/dL (ref 0.44–1.00)
GFR, Estimated: >60 mL/min (ref >60)
Glucose, Bld: 112 mg/dL — ABNORMAL HIGH (ref 70–99)
Potassium: 4.3 mmol/L (ref 3.5–5.1)
Sodium: 139 mmol/L (ref 135–145)
Total Bilirubin: 0.7 mg/dL (ref 0.0–1.2)
Total Protein: 7.3 g/dL (ref 6.5–8.1)

## 2023-09-25 MED ORDER — IOHEXOL 300 MG/ML  SOLN
80.0000 mL | Freq: Once | INTRAMUSCULAR | Status: AC | PRN
Start: 2023-09-25 — End: 2023-09-25
  Administered 2023-09-25: 80 mL via INTRAVENOUS

## 2023-09-26 LAB — CA 125: Cancer Antigen (CA) 125: 9.4 U/mL (ref 0.0–38.1)

## 2023-10-02 ENCOUNTER — Inpatient Hospital Stay: Admitting: Hematology and Oncology

## 2023-10-02 ENCOUNTER — Telehealth: Payer: Self-pay

## 2023-10-02 ENCOUNTER — Ambulatory Visit (HOSPITAL_BASED_OUTPATIENT_CLINIC_OR_DEPARTMENT_OTHER): Admitting: Hematology and Oncology

## 2023-10-02 ENCOUNTER — Encounter: Payer: Self-pay | Admitting: Hematology and Oncology

## 2023-10-02 DIAGNOSIS — C482 Malignant neoplasm of peritoneum, unspecified: Secondary | ICD-10-CM | POA: Diagnosis not present

## 2023-10-02 NOTE — Assessment & Plan Note (Signed)
 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  Imaging study in May 2024 showed no evidence of disease but repeat imaging study by August 2024 show evidence of recurrence in the vaginal cuff, amenable to surgical resection The patient is given 6 more cycles of adjuvant chemotherapy with combination of carboplatin  and paclitaxel , completed by January 2025  I reviewed CT imaging from February 2025 with the patient which show no evidence of residual disease  She started niraparib  on June 23, 2023 Due to side effects, niraparib  dose was reduced to 100 mg which she tolerated well I have reviewed blood work and imaging study from June 2025 which showed no evidence of disease She will continue niraparib  indefinitely I plan to see her again in August for further follow-up

## 2023-10-02 NOTE — Progress Notes (Signed)
 HEMATOLOGY-ONCOLOGY ELECTRONIC VISIT PROGRESS NOTE  Patient Care Team: Default, Provider, MD as PCP - General  I connected with the patient via telephone conference and verified that I am speaking with the correct person using two identifiers. The patient's location is at home and I am providing care from the [facility] I discussed the limitations, risks, security and privacy concerns of performing an evaluation and management service by e-visits and the availability of in person appointments.  I also discussed with the patient that there may be a patient responsible charge related to this service. The patient expressed understanding and agreed to proceed.   ASSESSMENT & PLAN:  Primary peritoneal carcinomatosis (HCC) The patient was initially diagnosed with primary peritoneal carcinomatosis in July 2022 Due to locally advanced disease, she was deemed not a surgical candidate She received neoadjuvant chemotherapy with excellent response to therapy followed by interval debulking surgery and subsequent completion of adjuvant treatment by January 2023 Pathology: Neg genetics, Molecular testing through [facility] in 2024: HER2/neu 0, negative, F0 LR 2+, 40%, negative, PD-L1 CPS score 5%, ER +75%, HRD negative  By February 2024, she was noted to have rising tumor marker.  Imaging study in May 2024 showed no evidence of disease but repeat imaging study by August 2024 show evidence of recurrence in the vaginal cuff, amenable to surgical resection The patient is given 6 more cycles of adjuvant chemotherapy with combination of carboplatin  and paclitaxel , completed by January 2025  I reviewed CT imaging from February 2025 with the patient which show no evidence of residual disease  She started niraparib  on June 23, 2023 Due to side effects, niraparib  dose was reduced to 100 mg which she tolerated well I have reviewed blood work and imaging study from June 2025 which showed no evidence of disease She  will continue niraparib  indefinitely I plan to see her again in August for further follow-up   No orders of the defined types were placed in this encounter.   INTERVAL HISTORY: Please see below for problem oriented charting. The purpose of today's discussion is review test results She tolerated treatment well without new side effects We discussed results of blood work and CT imaging  SUMMARY OF ONCOLOGIC HISTORY: Oncology History Overview Note  Neg genetics,  Molecular testing through [facility] in 2024:  HER2/neu 0, negative F0 LR 2+, 40%, negative PD-L1 CPS score 5% ER +75% HRD negative   Primary peritoneal carcinomatosis (HCC)  08/21/2020 Initial Diagnosis   The patient reports intermittent right upper quadrant pain that feel like she pulled a muscle beginning in March 2022.  In April, she woke up with sharp pain in her right upper quadrant.  This was her first episode of sharp pain.  She thought that her pain was related to her gallbladder and presented to the emergency department.  She was seen in ED on 4/30. RUQ ultrasound and x-ray were normal as were LFTs and lipase.    10/27/2020 Imaging   Findings highly suspicious for peritoneal carcinomatosis. No site of primary malignancy identified.   Colonic diverticulosis, without radiographic evidence of diverticulitis   11/02/2020 Initial Diagnosis   Primary peritoneal carcinomatosis (HCC)   11/02/2020 Tumor Marker   Patient's tumor was tested for the following markers: CA-125. Results of the tumor marker test revealed 88.   11/11/2020 Pathology Results   FINAL MICROSCOPIC DIAGNOSIS:   A. CUL DE SAC, LEFT ANTERIOR, EXCISION:  -  Poorly differentiated carcinoma  -  See comment   B. SIDEWALL, RIGHT, BIOPSY:  -  Poorly differentiated carcinoma  -  See comment   COMMENT:   By immunohistochemistry, the neoplastic cells are positive for cytokeratin 7, p53, PAX8 and WT1 but negative for cytokeratin 20, GATA3, CDX2 and TTF-1.   Overall, the immunophenotype is consistent with a  gynecologic primary.    11/11/2020 Pathology Results   FINAL MICROSCOPIC DIAGNOSIS:  - Malignant cells consistent with metastatic adenocarcinoma    11/11/2020 Surgery   Pre-operative Diagnosis: Carcinomatosis, mildly elevated CA-125   Post-operative Diagnosis: same, At least stage IIIC gyn malignancy   Operation: Diagnostic laparoscopy, peritoneal biopsies    Surgeon: Wiley Hanger MD Operative Findings: On EUA, small mobile uterus. On intra-abdominal entry, carcinomatosis appreciated studding the right anterior diaphragm, bilateral liver surfaces, mesentery, pelvic peritoneum. Omental cake with measuring up to 2x3cm. Right ovary adherent to the right uterus with peritoneal studding adjacent. Frondular implants noted along right pelvic sidewall. Miliary disease along anterior cul de sac. Cul de sac covered with friable miliary disease. Small volume dark amber ascites. Specimens: left anterior cul de sac peritoneum, right pelvic sidewall peritoneal biopsy          11/15/2020 Cancer Staging   Staging form: Ovary, Fallopian Tube, and Primary Peritoneal Carcinoma, AJCC 8th Edition - Clinical stage from 11/15/2020: FIGO Stage III (cT3, cN0, cM0) - Signed by Almeda Jacobs, MD on 11/15/2020 Stage prefix: Initial diagnosis   11/26/2020 - 05/13/2021 Chemotherapy   Patient is on Treatment Plan : OVARIAN Carboplatin  (AUC 6) / Paclitaxel  (175) q21d x 6 cycles     11/26/2020 Tumor Marker   Patient's tumor was tested for the following markers: CA-125. Results of the tumor marker test revealed 76.8.   12/17/2020 Tumor Marker   Patient's tumor was tested for the following markers: CA-125. Results of the tumor marker test revealed 58.1.   01/07/2021 Tumor Marker   Patient's tumor was tested for the following markers: CA-125. Results of the tumor marker test revealed 21.6.   01/20/2021 Imaging   Decreased omental soft tissue nodularity and caking,  consistent with interval improvement in carcinoma.   No evidence of new or progressive disease within the abdomen or pelvis.   Colonic diverticulosis. No radiographic evidence of diverticulitis.   Large stool burden noted; recommend clinical correlation for possible constipation   02/24/2021 Surgery   Robotic-assisted laparoscopic total hysterectomy with bilateral salpingoophorectomy, peritoneal stripping, mini-lap for omentectomy and intra-abdominal palpation  Findings: On EUA, small mobile uterus. Rectum free. On intra-abdominal entry, normal upper abdominal survey including liver edge, diaphragm and stomach. Omentum with several small (<2cm) areas of thickening, suspected treated tumor versus residual tumor. Uterus 6cm and normal in appearance. Atrophic appearing bilateral adnexa. Some adhesions of the left ovary to the medial leaf of the broad ligament. Minimal peritoneal nodularity versus treated disease within posterior cul de sac and deep left pelvis, all either excised or ablated. No significant peritoneal disease or carcinomatosis. No sigmoid or rectal involvement. Small bowel normal in appearance. No adenopathy. No ascites. R0 resection.    02/24/2021 Pathology Results   A. PERTIONEAL SCAR, EXCISION:  - Microscopic focus of high-grade serous carcinoma   B. 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 OMENTUM, RESECTION:  - Microscopic foci of high-grade serous carcinoma   03/14/2021 Tumor Marker   Patient's tumor was  tested for the following markers: CA-125. Results of the tumor marker test revealed 32.7.   03/23/2021 Imaging   Unremarkable right upper quadrant ultrasound   04/01/2021 Genetic Testing   HRD status through [facility] was negative.  The  report date is 04/01/2021. Negative hereditary cancer genetic testing: no pathogenic variants detected in [employer] [facility].  The report date is 04/04/2021.  The [facility] gene panel offered by [employer] includes sequencing and deletion/duplication testing of the following 48 genes: APC, ATM, AXIN2, BAP1, BARD1, BMPR1A, BRCA1, BRCA2, BRIP1, CHD1, CDK4, CDKN2A(p16 and p14ARF), CHEK2, CTNNA1, EGFR, EPCAM, FH, FLCN, GREM1, HOXB13, MEN1, MET, MITF , MLH1, MSH2, MSH3, MSH6, MUTYH, NTHL1, PALB2, PMS2, POLD1, POLE, PTEN, RAD51C, RAD51D, RET, SDHA, SDHB, SDHC, SDHD, SMAD4, STK11,TERT, TP53, TSC1, TSC2, and VHL.   04/12/2021 Tumor Marker   Patient's tumor was tested for the following markers: CA-125. Results of the tumor marker test revealed 12.7.   06/13/2021 Imaging   1. No new mass or lymphadenopathy identified in the abdomen or pelvis. Interval resolution of previous omental soft tissue caking. 2. Other ancillary findings.   06/13/2021 Tumor Marker   Patient's tumor was tested for the following markers: CA-125. Results of the tumor marker test revealed 10.1.   07/11/2021 Tumor Marker   Patient's tumor was tested for the following markers: CA-125. Results of the tumor marker test revealed 9.4.   09/08/2021 Imaging   1. Status post hysterectomy and omentectomy. 2. No evidence of lymphadenopathy or metastatic disease in the abdomen or pelvis.     09/08/2021 Tumor Marker   Patient's tumor was tested for the following markers: CA-125. Results of the tumor marker test revealed 9.0.   02/22/2022 Imaging   IMPRESSION: 1. No acute findings.  No ascites, mass or adenopathy. 2.  Aortic Atherosclerosis (ICD10-170.0).   03/10/2022 Tumor Marker   Patient's tumor was tested for the following markers: CA-125. Results of the tumor marker test revealed 11.7.   06/09/2022 Tumor Marker   Patient's tumor was tested for the following markers: CA-125. Results of the tumor marker test revealed 29.2.    09/07/2022 Tumor Marker   Patient's tumor was tested for the following markers: CA-125. Results of the tumor marker test revealed 39.7.   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 RADIATION DOSE REDUCTION: This exam was performed according to the departmental dose-optimization program which includes automated exposure control, adjustment of the mA and/or kV according to patient size and/or use of iterative reconstruction technique. CONTRAST:  60mL OMNIPAQUE  IOHEXOL  350 MG/ML SOLN COMPARISON:  06/22/2022 and previous FINDINGS: Lower chest: No pleural or pericardial effusion. Visualized lung bases grossly clear, with some motion degradation. Hepatobiliary: No focal liver abnormality is seen. No gallstones, gallbladder wall thickening, or biliary dilatation. Pancreas: Unremarkable. No pancreatic ductal dilatation or surrounding inflammatory changes. Spleen: Normal in size without focal abnormality. Accessory splenule. Adrenals/Urinary Tract: No adrenal mass. Symmetric renal parenchymal enhancement without urolithiasis or hydronephrosis. Urinary bladder incompletely distended. Stomach/Bowel: Stomach incompletely distended, unremarkable. Small bowel decompressed. Post appendectomy. Gas and fecal material partially distend the colon, without acute finding. A few scattered diverticula in the sigmoid segment. Vascular/Lymphatic: Minimal calcified aortic plaque without aneurysm or stenosis. Portal vein patent. A few scattered mesenteric lymph nodes none greater than  4 mm short axis diameter. No abdominal or pelvic adenopathy. Reproductive: Status post hysterectomy. No adnexal masses. No peritoneal nodularity. Other: No  ascites. No free air. Bilateral pelvic phleboliths stable. Musculoskeletal: Stable lower lumbar spondylitic change with early grade and grade 1 anterolisthesis L4-5 and degenerative disc disease L5-S1. No fracture or worrisome bone lesion. IMPRESSION: 1. No acute findings. 2. No evidence of recurrent or metastatic disease. 3. Sigmoid diverticulosis. Electronically Signed   By: Nicoletta Barrier M.D.   On: 09/15/2022 20:09      11/24/2022 Tumor Marker   Patient's tumor was tested for the following markers: CA-125. Results of the tumor marker test revealed 47.6.   12/04/2022 Imaging   CT A/P 1. A new 2.4 x 2.1 cm low attenuating structure in the right pelvis abutting the vaginal cuff concerning for recurrent disease, given rise in CA 125. Clinical correlation is recommended. 2. No bowel obstruction. Appendectomy. 3.  Aortic Atherosclerosis (ICD10-I70.0).   12/20/2022 Surgery   Robotic-assisted excision of pelvic mass requiring colpotomy with vaginal cuff closure Orvil Bland), excision of left para-colic gutter implant (White), low anterior resection with reanastomosis Camilo Cella), cystoscopy Orvil Bland)   Findings: On EUA, 2-3 cm mass is palpable in the vaginal apex, and close proximity to the rectum but without direct involvement.  Intra-abdominal entry, normal-appearing diaphragm, liver edge, and stomach.  No peritoneal disease or implants noted.  Normal-appearing small bowel.  Surgically absent uterus, cervix, fallopian tubes and ovaries.  Small, 5 mm cystic nodule noted along the left paracolic gutter lateral to the sigmoid colon (excised).  Within the pelvis, there was a 3 cm cystic mass, apparently bilobed, adherent to the posterior aspect of the vaginal cuff in the midline into the right.  This was close to but not directly involving the rectum nor much of the rectal mesentery.  There was minimal spillage from the cystic lesion during manipulation to excise the specimen.  Upon resection of this area, the more  superior aspect of the rectum was identified as having a similar appearing 1-1.5 cm cystic mass involving the rectum and adjacent mesentery.  The entire lesion was removed within the segment of rectosigmoid colon.  No additional implants or findings concerning for metastatic disease were noted in the abdomen or pelvis. R0 resection. On cystoscopy, bladder dome intact. Good efflux noted from bilateral ureteral orifices.   12/20/2022 Pathology Results   A. PELVIC MASS, EXCISION:  - High-grade serous carcinoma, involving vaginal mucosa and adjacent  soft tissue   B. LEFT PERICOLIC GUTTER NODULE, EXCISION:  - High-grade serous carcinoma  - Resection margin is negative for carcinoma   C. RECTOSIGMOID COLON, RESECTION:  - High-grade serous carcinoma involving segment of rectosigmoid colon  - Lymph node, negative for carcinoma (0/1)  - Resection margins, negative for carcinoma   D. FINAL DISTAL MARGIN OF ANASTOMOTIC RING, EXCISION:  - Colonic donut, negative for carcinoma   Cytology FINAL MICROSCOPIC DIAGNOSIS:  - Malignant cells present  - See comment     01/19/2023 - 05/04/2023 Chemotherapy   Patient is on Treatment Plan : OVARIAN Carboplatin  (AUC 6) + Paclitaxel  (175) q21d X 6 Cycles     01/19/2023 Tumor Marker   Patient's tumor was tested for the following markers: CA-125. Results of the tumor marker test revealed 11.2.   02/12/2023 Tumor Marker   Patient's tumor was tested for the following markers: CA-125. Results of the tumor marker test revealed 9.9.   03/05/2023 Tumor Marker   Patient's tumor was tested for the following markers:  CA-125. Results of the tumor marker test revealed 11.7.   03/21/2023 Tumor Marker   Patient's tumor was tested for the following markers: CA-125. Results of the tumor marker test revealed 10.2.   04/16/2023 Tumor Marker   Patient's tumor was tested for the following markers: CA-125. Results of the tumor marker test revealed 10.4.   06/04/2023  Imaging   CT ABDOMEN PELVIS W CONTRAST Result Date: 06/05/2023 CLINICAL DATA:  History of ovarian carcinoma, assess treatment response EXAM: CT ABDOMEN AND PELVIS WITH CONTRAST TECHNIQUE: Multidetector CT imaging of the abdomen and pelvis was performed using the standard protocol following bolus administration of intravenous contrast. RADIATION DOSE REDUCTION: This exam was performed according to the departmental dose-optimization program which includes automated exposure control, adjustment of the mA and/or kV according to patient size and/or use of iterative reconstruction technique. CONTRAST:  OMNIPAQUE  IOHEXOL  300 MG/ML  SOLN COMPARISON:  12/04/2022 and previous FINDINGS: Lower chest: No pleural or pericardial effusion. Visualized lung bases clear. Hepatobiliary: No focal liver abnormality is seen. No gallstones, gallbladder wall thickening, or biliary dilatation. Pancreas: Unremarkable. No pancreatic ductal dilatation or surrounding inflammatory changes. Spleen: Normal in size without focal abnormality. Small accessory splenule, stable. Adrenals/Urinary Tract: No adrenal mass. Symmetric renal parenchymal enhancement without focal lesion, hydronephrosis, or evident urolithiasis. Urinary bladder nondistended. Stomach/Bowel: Stomach incompletely distended, unremarkable. Small bowel nondistended. Post appendectomy. The colon is partially distended. Anastomotic staple line at the rectosigmoid junction. Vascular/Lymphatic: Minimal calcified aortic plaque without aneurysm. Patent portal and renal veins. No abdominal or pelvic adenopathy. Reproductive: Status post hysterectomy. No adnexal masses. The cystic process previously described in the region of the vaginal cuff on the right is no longer visualized. Other: Left pelvic phlebolith.  No ascites.  No free air. Musculoskeletal: Mild spondylitic changes throughout the lumbar spine. Grade 1 anterolisthesis L4-5 attributed to bilateral facet DJD. IMPRESSION: 1.  No acute findings. 2. Interval resolution of the cystic process at the vaginal cuff. 3. No evidence of recurrent or metastatic disease. Electronically Signed   By: Nicoletta Barrier M.D.   On: 06/05/2023 13:20      06/05/2023 Tumor Marker   Patient's tumor was tested for the following markers: CA-125. Results of the tumor marker test revealed 9.5.   06/30/2023 Tumor Marker   Patient's tumor was tested for the following markers: CA-125. Results of the tumor marker test revealed 8.3.   07/10/2023 Tumor Marker   Patient's tumor was tested for the following markers: CA-`125. Results of the tumor marker test revealed 10.7.   09/25/2023 Imaging   CT ABDOMEN PELVIS W CONTRAST Result Date: 09/25/2023 CLINICAL DATA:  Ovarian cancer. In remission since February. * Tracking Code: BO * EXAM: CT ABDOMEN AND PELVIS WITH CONTRAST TECHNIQUE: Multidetector CT imaging of the abdomen and pelvis was performed using the standard protocol following bolus administration of intravenous contrast. RADIATION DOSE REDUCTION: This exam was performed according to the departmental dose-optimization program which includes automated exposure control, adjustment of the mA and/or kV according to patient size and/or use of iterative reconstruction technique. CONTRAST:  80mL OMNIPAQUE  IOHEXOL  300 MG/ML  SOLN COMPARISON:  06/04/2023 FINDINGS: Lower chest: Clear lung bases. Normal heart size without pericardial or pleural effusion. Hepatobiliary: Normal liver. Normal gallbladder, without biliary ductal dilatation. Pancreas: Normal, without mass or ductal dilatation. Spleen: Normal in size, without focal abnormality. Adrenals/Urinary Tract: Normal adrenal glands. Normal kidneys, without hydronephrosis. Normal urinary bladder. Stomach/Bowel: Normal stomach, without wall thickening. Surgical sutures in the rectum. Colonic stool burden suggests constipation. Normal  small bowel. Vascular/Lymphatic: Aortic atherosclerosis. No abdominopelvic adenopathy.  Reproductive: Hysterectomy.  No adnexal mass. Other: No significant free fluid. No evidence of omental or peritoneal disease. Musculoskeletal: Osteopenia.  Lumbosacral spondylosis. IMPRESSION: 1. No acute process or evidence of metastatic disease in the abdomen or pelvis. 2. Incidental findings, including: Possible constipation. Aortic Atherosclerosis (ICD10-I70.0). Electronically Signed   By: Lore Rode M.D.   On: 09/25/2023 12:39   MM 3D SCREENING MAMMOGRAM BILATERAL BREAST Result Date: 08/29/2023 CLINICAL DATA:  Screening. EXAM: DIGITAL SCREENING BILATERAL MAMMOGRAM WITH TOMOSYNTHESIS AND CAD TECHNIQUE: Bilateral screening digital craniocaudal and mediolateral oblique mammograms were obtained. Bilateral screening digital breast tomosynthesis was performed. The images were evaluated with computer-aided detection. COMPARISON:  Previous exam(s). ACR Breast Density Category b: There are scattered areas of fibroglandular density. FINDINGS: There are no findings suspicious for malignancy. IMPRESSION: No mammographic evidence of malignancy. A result letter of this screening mammogram will be mailed directly to the patient. RECOMMENDATION: Screening mammogram in one year. (Code:[id number]) BI-RADS CATEGORY  1: Negative. Electronically Signed   By: Sande Cromer M.D.   On: 08/29/2023 13:52      09/26/2023 Tumor Marker   Patient's tumor was tested for the following markers: CA-125. Results of the tumor marker test revealed 9.4.    I discussed the assessment and treatment plan with the patient. The patient was provided an opportunity to ask questions and all were answered. The patient agreed with the plan and demonstrated an understanding of the instructions. The patient was advised to call back or seek an in-person evaluation if the symptoms worsen or if the condition fails to improve as anticipated.    I spent 20 minutes for the appointment reviewing test results, discuss management and coordination of  care.  Almeda Jacobs, MD 10/02/2023 1:13 PM

## 2023-10-02 NOTE — Telephone Encounter (Signed)
 Called regarding no show to appt today. Changed to phone visit for today. She is aware of appt.

## 2023-10-15 ENCOUNTER — Other Ambulatory Visit: Payer: Self-pay

## 2023-10-15 ENCOUNTER — Encounter (INDEPENDENT_AMBULATORY_CARE_PROVIDER_SITE_OTHER): Payer: Self-pay

## 2023-10-15 NOTE — Progress Notes (Signed)
 Specialty Pharmacy Refill Coordination Note  Robin Sellers is a 73 y.o. female contacted today regarding refills of specialty medication(s) Niraparib  Tosylate (ZEJULA )   Patient requested [name] at [facility] at [place] date: 10/19/23   Medication will be filled on 10/18/23.

## 2023-10-18 ENCOUNTER — Other Ambulatory Visit: Payer: Self-pay

## 2023-10-19 ENCOUNTER — Other Ambulatory Visit (HOSPITAL_COMMUNITY): Payer: Self-pay

## 2023-10-19 ENCOUNTER — Other Ambulatory Visit: Payer: Self-pay

## 2023-10-19 MED ORDER — RISEDRONATE SODIUM 150 MG PO TABS
150.0000 mg | ORAL_TABLET | ORAL | 4 refills | Status: AC
  Filled 2023-10-19: qty 3, 90d supply, fill #0

## 2023-10-22 ENCOUNTER — Other Ambulatory Visit (HOSPITAL_COMMUNITY): Payer: Self-pay

## 2023-10-22 ENCOUNTER — Other Ambulatory Visit: Payer: Self-pay

## 2023-11-05 ENCOUNTER — Other Ambulatory Visit: Payer: Self-pay

## 2023-11-05 NOTE — Progress Notes (Signed)
 Specialty Pharmacy Refill Coordination Note  Robin Sellers is a 73 y.o. female contacted today regarding refills of specialty medication(s) Niraparib  Tosylate (ZEJULA )   Patient requested [name] at [facility] at [place] date: 11/09/23   Medication will be filled on 11/09/23.

## 2023-11-05 NOTE — Progress Notes (Signed)
 Specialty Pharmacy Ongoing Clinical Assessment Note  Robin Sellers is a 73 y.o. female who is being followed by the specialty pharmacy service for RxSp Oncology   Patient's specialty medication(s) reviewed today: Niraparib  Tosylate (ZEJULA )   Missed doses in the last 4 weeks: 0   Patient/Caregiver did not have any additional questions or concerns.   Therapeutic benefit summary: Patient is achieving benefit   Adverse events/side effects summary: No adverse events/side effects   Patient's therapy is appropriate to: Continue    Goals Addressed             This Visit's Progress    Maintain optimal adherence to therapy   On track    Patient is on track. Patient will maintain adherence         Follow up: 3 months  [facility] Specialty Pharmacist

## 2023-11-09 ENCOUNTER — Other Ambulatory Visit: Payer: Self-pay

## 2023-11-19 ENCOUNTER — Other Ambulatory Visit (HOSPITAL_COMMUNITY): Payer: Self-pay

## 2023-11-19 ENCOUNTER — Other Ambulatory Visit: Payer: Self-pay

## 2023-11-19 NOTE — Progress Notes (Signed)
 Prescription was returned to stock in error, patient was contacted and patient will pick up within the next hour.

## 2023-11-21 ENCOUNTER — Telehealth: Payer: Self-pay

## 2023-11-21 NOTE — Telephone Encounter (Signed)
 Pls schedule appt to see me next week, labs and see me 20 mins

## 2023-11-21 NOTE — Telephone Encounter (Signed)
 Pt called and states she is supposed to have labs and MD f/u in August; verified this by MD note 09/2023. This has not been scheduled. Pt states she prefers to come in a few days prior to her MD visit for labs so that her CA 125 will be back when she sees MD.  She also states she prefers NOT to have Dr Lonn visit same day as Dr Viktoria visit, which is currently scheduled for 8/29 at 1315. Advised pt I would send Dr Lonn a message to let her know about this conversation.

## 2023-11-22 ENCOUNTER — Telehealth: Payer: Self-pay

## 2023-11-22 ENCOUNTER — Inpatient Hospital Stay: Attending: Hematology and Oncology

## 2023-11-22 DIAGNOSIS — C482 Malignant neoplasm of peritoneum, unspecified: Secondary | ICD-10-CM

## 2023-11-22 DIAGNOSIS — C786 Secondary malignant neoplasm of retroperitoneum and peritoneum: Secondary | ICD-10-CM | POA: Insufficient documentation

## 2023-11-22 DIAGNOSIS — Z8543 Personal history of malignant neoplasm of ovary: Secondary | ICD-10-CM | POA: Insufficient documentation

## 2023-11-22 LAB — COMPREHENSIVE METABOLIC PANEL WITH GFR
ALT: 16 U/L (ref 0–44)
AST: 23 U/L (ref 15–41)
Albumin: 4.7 g/dL (ref 3.5–5.0)
Alkaline Phosphatase: 52 U/L (ref 38–126)
Anion gap: 7 (ref 5–15)
BUN: 25 mg/dL — ABNORMAL HIGH (ref 8–23)
CO2: 34 mmol/L — ABNORMAL HIGH (ref 22–32)
Calcium: 9.9 mg/dL (ref 8.9–10.3)
Chloride: 99 mmol/L (ref 98–111)
Creatinine, Ser: 0.9 mg/dL (ref 0.44–1.00)
GFR, Estimated: >60 mL/min (ref >60)
Glucose, Bld: 109 mg/dL — ABNORMAL HIGH (ref 70–99)
Potassium: 4.2 mmol/L (ref 3.5–5.1)
Sodium: 140 mmol/L (ref 135–145)
Total Bilirubin: 0.6 mg/dL (ref 0.0–1.2)
Total Protein: 7.7 g/dL (ref 6.5–8.1)

## 2023-11-22 LAB — CBC WITH DIFFERENTIAL/PLATELET
Abs Immature Granulocytes: 0.01 K/uL (ref 0.00–0.07)
Basophils Absolute: 0 K/uL (ref 0.0–0.1)
Basophils Relative: 1 %
Eosinophils Absolute: 0.2 K/uL (ref 0.0–0.5)
Eosinophils Relative: 3 %
HCT: 41.6 % (ref 36.0–46.0)
Hemoglobin: 14.4 g/dL (ref 12.0–15.0)
Immature Granulocytes: 0 %
Lymphocytes Relative: 19 %
Lymphs Abs: 1.2 K/uL (ref 0.7–4.0)
MCH: 31.9 pg (ref 26.0–34.0)
MCHC: 34.6 g/dL (ref 30.0–36.0)
MCV: 92 fL (ref 80.0–100.0)
Monocytes Absolute: 0.5 K/uL (ref 0.1–1.0)
Monocytes Relative: 8 %
Neutro Abs: 4.3 K/uL (ref 1.7–7.7)
Neutrophils Relative %: 69 %
Platelets: 177 K/uL (ref 150–400)
RBC: 4.52 MIL/uL (ref 3.87–5.11)
RDW: 12 % (ref 11.5–15.5)
WBC: 6.1 K/uL (ref 4.0–10.5)
nRBC: 0 % (ref 0.0–0.2)

## 2023-11-22 NOTE — Telephone Encounter (Signed)
 Patient called and scheduled for lab appointment today at 2 PM. Follow-up appointment made for Monday, 8/4 at 10:45 AM with Dr. Lonn to review labs. Patient verbalized and confirmed new appointment dates and times.

## 2023-11-22 NOTE — Telephone Encounter (Signed)
 Can you call her and ask if she can come in today for labs and see me Monday morning for follow-up? 20 mins

## 2023-11-23 ENCOUNTER — Ambulatory Visit: Admitting: Gynecologic Oncology

## 2023-11-23 LAB — CA 125: Cancer Antigen (CA) 125: 11.6 U/mL (ref 0.0–38.1)

## 2023-11-26 ENCOUNTER — Inpatient Hospital Stay: Attending: Hematology and Oncology | Admitting: Hematology and Oncology

## 2023-11-26 ENCOUNTER — Encounter: Payer: Self-pay | Admitting: Hematology and Oncology

## 2023-11-26 VITALS — BP 154/76 | HR 84 | Temp 98.1°F | Resp 18 | Ht 64.0 in | Wt 114.4 lb

## 2023-11-26 DIAGNOSIS — Z90722 Acquired absence of ovaries, bilateral: Secondary | ICD-10-CM | POA: Insufficient documentation

## 2023-11-26 DIAGNOSIS — Z8543 Personal history of malignant neoplasm of ovary: Secondary | ICD-10-CM | POA: Diagnosis present

## 2023-11-26 DIAGNOSIS — Z9221 Personal history of antineoplastic chemotherapy: Secondary | ICD-10-CM | POA: Diagnosis not present

## 2023-11-26 DIAGNOSIS — Z801 Family history of malignant neoplasm of trachea, bronchus and lung: Secondary | ICD-10-CM | POA: Diagnosis not present

## 2023-11-26 DIAGNOSIS — Z803 Family history of malignant neoplasm of breast: Secondary | ICD-10-CM | POA: Insufficient documentation

## 2023-11-26 DIAGNOSIS — C786 Secondary malignant neoplasm of retroperitoneum and peritoneum: Secondary | ICD-10-CM | POA: Diagnosis present

## 2023-11-26 DIAGNOSIS — Z90710 Acquired absence of both cervix and uterus: Secondary | ICD-10-CM | POA: Insufficient documentation

## 2023-11-26 DIAGNOSIS — C482 Malignant neoplasm of peritoneum, unspecified: Secondary | ICD-10-CM

## 2023-11-26 DIAGNOSIS — Z808 Family history of malignant neoplasm of other organs or systems: Secondary | ICD-10-CM | POA: Insufficient documentation

## 2023-11-26 DIAGNOSIS — L7682 Other postprocedural complications of skin and subcutaneous tissue: Secondary | ICD-10-CM

## 2023-11-26 DIAGNOSIS — C569 Malignant neoplasm of unspecified ovary: Secondary | ICD-10-CM

## 2023-11-26 DIAGNOSIS — Z9079 Acquired absence of other genital organ(s): Secondary | ICD-10-CM | POA: Diagnosis not present

## 2023-11-26 NOTE — Assessment & Plan Note (Addendum)
 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  Imaging study in May 2024 showed no evidence of disease but repeat imaging study by August 2024 show evidence of recurrence in the vaginal cuff, amenable to surgical resection The patient is given 6 more cycles of adjuvant chemotherapy with combination of carboplatin  and paclitaxel , completed by January 2025, imaging study from February 2025 showed complete response  She started niraparib  on June 23, 2023 Due to side effects, niraparib  dose was reduced to 100 mg which she tolerated well Imaging study from June 2025 showed no evidence of disease She will continue niraparib  indefinitely Due to high risk for cancer recurrence, we will continue serial imaging study every 3 months, next imaging will be in September

## 2023-11-26 NOTE — Assessment & Plan Note (Addendum)
 She continues to have intermittent abdominal discomfort that comes and goes I suspect this is related to her postop status As above, plan to repeat imaging study next month

## 2023-11-26 NOTE — Progress Notes (Signed)
 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  Imaging study in May 2024 showed no evidence of disease but repeat imaging study by August 2024 show evidence of recurrence in the vaginal cuff, amenable to surgical resection The patient is given 6 more cycles of adjuvant chemotherapy with combination of carboplatin  and paclitaxel , completed by January 2025, imaging study from February 2025 showed complete response  She started niraparib  on June 23, 2023 Due to side effects, niraparib  dose was reduced to 100 mg which she tolerated well Imaging study from June 2025 showed no evidence of disease She will continue niraparib  indefinitely Due to high risk for cancer recurrence, we will continue serial imaging study every 3 months, next imaging will be in September  Incisional pain She continues to have intermittent abdominal discomfort that comes and goes I suspect this is related to her postop status As above, plan to repeat imaging study next month   Orders Placed This Encounter  Procedures   Ovarian CT ABD/PELVIS no oral contrast    No need oral contrast    Standing Status:   Future    Expected Date:   12/26/2023    Expiration Date:   11/25/2024    If indicated for the ordered procedure, I authorize the administration of  contrast media per Radiology protocol:   Yes    Does the patient have a contrast media/X-ray dye allergy?:   No    Preferred imaging location?:   [facility]    If indicated for the ordered procedure, I authorize the administration of oral contrast media per Radiology protocol:   No    Reason for no oral contrast::   No need for oral contrast    Cathie Bonnell, MD  INTERVAL HISTORY: she returns for treatment follow-up Complications related to previous cycle of chemotherapy included intermittent abdominal pain Denies recent nausea or headaches  PHYSICAL EXAMINATION: ECOG PERFORMANCE STATUS: 1 - Symptomatic but completely ambulatory  Vitals:   11/26/23 1052  BP: (!) 154/76  Pulse: 84  Resp: 18  Temp: 98.1 F (36.7 C)  SpO2: 100%   Filed Weights   11/26/23 1052  Weight: 114 lb 6.4 oz (51.9 kg)    Relevant data reviewed during this visit included CBC, CMP, CA125

## 2023-12-07 ENCOUNTER — Other Ambulatory Visit (HOSPITAL_COMMUNITY): Payer: Self-pay

## 2023-12-10 ENCOUNTER — Other Ambulatory Visit: Payer: Self-pay

## 2023-12-10 ENCOUNTER — Encounter (INDEPENDENT_AMBULATORY_CARE_PROVIDER_SITE_OTHER): Payer: Self-pay

## 2023-12-11 ENCOUNTER — Other Ambulatory Visit: Payer: Self-pay

## 2023-12-11 NOTE — Progress Notes (Signed)
 Specialty Pharmacy Refill Coordination Note  Robin Sellers is a 73 y.o. female contacted today regarding refills of specialty medication(s) Niraparib  Tosylate (ZEJULA )   Patient requested (Patient-Rptd) Pickup at [facility] date: (Patient-Rptd) 12/21/23   Medication will be filled on 08.28.25.

## 2023-12-21 ENCOUNTER — Inpatient Hospital Stay: Admitting: Gynecologic Oncology

## 2023-12-21 ENCOUNTER — Encounter: Payer: Self-pay | Admitting: Gynecologic Oncology

## 2023-12-21 VITALS — BP 130/78 | HR 84 | Temp 98.4°F | Wt 113.4 lb

## 2023-12-21 DIAGNOSIS — C482 Malignant neoplasm of peritoneum, unspecified: Secondary | ICD-10-CM

## 2023-12-21 DIAGNOSIS — R1011 Right upper quadrant pain: Secondary | ICD-10-CM

## 2023-12-21 DIAGNOSIS — Z8543 Personal history of malignant neoplasm of ovary: Secondary | ICD-10-CM | POA: Diagnosis not present

## 2023-12-21 DIAGNOSIS — C786 Secondary malignant neoplasm of retroperitoneum and peritoneum: Secondary | ICD-10-CM | POA: Diagnosis not present

## 2023-12-21 NOTE — Progress Notes (Signed)
 Gynecologic Oncology Return Clinic Visit  12/21/23  Reason for Visit: surveillance  Treatment History: Oncology History Overview Note  Neg genetics,  Molecular testing through [facility] in 2024:  HER2/neu 0, negative F0 LR 2+, 40%, negative PD-L1 CPS score 5% ER +75% HRD negative   Primary peritoneal carcinomatosis (HCC)  08/21/2020 Initial Diagnosis   The patient reports intermittent right upper quadrant pain that feel like she pulled a muscle beginning in March 2022.  In April, she woke up with sharp pain in her right upper quadrant.  This was her first episode of sharp pain.  She thought that her pain was related to her gallbladder and presented to the emergency department.  She was seen in ED on 4/30. RUQ ultrasound and x-ray were normal as were LFTs and lipase.    10/27/2020 Imaging   Findings highly suspicious for peritoneal carcinomatosis. No site of primary malignancy identified.   Colonic diverticulosis, without radiographic evidence of diverticulitis   11/02/2020 Initial Diagnosis   Primary peritoneal carcinomatosis (HCC)   11/02/2020 Tumor Marker   Patient's tumor was tested for the following markers: CA-125. Results of the tumor marker test revealed 88.   11/11/2020 Pathology Results   FINAL MICROSCOPIC DIAGNOSIS:   A. CUL DE SAC, LEFT ANTERIOR, EXCISION:  -  Poorly differentiated carcinoma  -  See comment   B. SIDEWALL, RIGHT, BIOPSY:  -  Poorly differentiated carcinoma  -  See comment   COMMENT:   By immunohistochemistry, the neoplastic cells are positive for cytokeratin 7, p53, PAX8 and WT1 but negative for cytokeratin 20, GATA3, CDX2 and TTF-1.  Overall, the immunophenotype is consistent with a  gynecologic primary.    11/11/2020 Pathology Results   FINAL MICROSCOPIC DIAGNOSIS:  - Malignant cells consistent with metastatic adenocarcinoma    11/11/2020 Surgery   Pre-operative Diagnosis: Carcinomatosis, mildly elevated CA-125   Post-operative Diagnosis: same,  At least stage IIIC gyn malignancy   Operation: Diagnostic laparoscopy, peritoneal biopsies    Surgeon: Viktoria Crank MD Operative Findings: On EUA, small mobile uterus. On intra-abdominal entry, carcinomatosis appreciated studding the right anterior diaphragm, bilateral liver surfaces, mesentery, pelvic peritoneum. Omental cake with measuring up to 2x3cm. Right ovary adherent to the right uterus with peritoneal studding adjacent. Frondular implants noted along right pelvic sidewall. Miliary disease along anterior cul de sac. Cul de sac covered with friable miliary disease. Small volume dark amber ascites. Specimens: left anterior cul de sac peritoneum, right pelvic sidewall peritoneal biopsy          11/15/2020 Cancer Staging   Staging form: Ovary, Fallopian Tube, and Primary Peritoneal Carcinoma, AJCC 8th Edition - Clinical stage from 11/15/2020: FIGO Stage III (cT3, cN0, cM0) - Signed by Lonn Hicks, MD on 11/15/2020 Stage prefix: Initial diagnosis   11/26/2020 - 05/13/2021 Chemotherapy   Patient is on Treatment Plan : OVARIAN Carboplatin  (AUC 6) / Paclitaxel  (175) q21d x 6 cycles     11/26/2020 Tumor Marker   Patient's tumor was tested for the following markers: CA-125. Results of the tumor marker test revealed 76.8.   12/17/2020 Tumor Marker   Patient's tumor was tested for the following markers: CA-125. Results of the tumor marker test revealed 58.1.   01/07/2021 Tumor Marker   Patient's tumor was tested for the following markers: CA-125. Results of the tumor marker test revealed 21.6.   01/20/2021 Imaging   Decreased omental soft tissue nodularity and caking, consistent with interval improvement in carcinoma.   No evidence of new or progressive disease within  the abdomen or pelvis.   Colonic diverticulosis. No radiographic evidence of diverticulitis.   Large stool burden noted; recommend clinical correlation for possible constipation   02/24/2021 Surgery   Robotic-assisted  laparoscopic total hysterectomy with bilateral salpingoophorectomy, peritoneal stripping, mini-lap for omentectomy and intra-abdominal palpation  Findings: On EUA, small mobile uterus. Rectum free. On intra-abdominal entry, normal upper abdominal survey including liver edge, diaphragm and stomach. Omentum with several small (<2cm) areas of thickening, suspected treated tumor versus residual tumor. Uterus 6cm and normal in appearance. Atrophic appearing bilateral adnexa. Some adhesions of the left ovary to the medial leaf of the broad ligament. Minimal peritoneal nodularity versus treated disease within posterior cul de sac and deep left pelvis, all either excised or ablated. No significant peritoneal disease or carcinomatosis. No sigmoid or rectal involvement. Small bowel normal in appearance. No adenopathy. No ascites. R0 resection.    02/24/2021 Pathology Results   A. PERTIONEAL SCAR, EXCISION:  - Microscopic focus of high-grade serous carcinoma   B. 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 OMENTUM, RESECTION:  - Microscopic foci of high-grade serous carcinoma   03/14/2021 Tumor Marker   Patient's tumor was tested for the following markers: CA-125. Results of the tumor marker test revealed 32.7.   03/23/2021 Imaging   Unremarkable right upper quadrant ultrasound   04/01/2021 Genetic Testing   HRD status through [facility] was negative.  The report date is 04/01/2021. Negative hereditary cancer genetic testing: no pathogenic variants detected in [facility] [facility].  The report date is 04/04/2021.  The [facility] gene panel offered by [employer] includes sequencing and deletion/duplication testing of the following 48 genes: APC, ATM, AXIN2, BAP1, BARD1,  BMPR1A, BRCA1, BRCA2, BRIP1, CHD1, CDK4, CDKN2A(p16 and p14ARF), CHEK2, CTNNA1, EGFR, EPCAM, FH, FLCN, GREM1, HOXB13, MEN1, MET, MITF , MLH1, MSH2, MSH3, MSH6, MUTYH, NTHL1, PALB2, PMS2, POLD1, POLE, PTEN, RAD51C, RAD51D, RET, SDHA, SDHB, SDHC, SDHD, SMAD4, STK11,TERT, TP53, TSC1, TSC2, and VHL.   04/12/2021 Tumor Marker   Patient's tumor was tested for the following markers: CA-125. Results of the tumor marker test revealed 12.7.   06/13/2021 Imaging   1. No new mass or lymphadenopathy identified in the abdomen or pelvis. Interval resolution of previous omental soft tissue caking. 2. Other ancillary findings.   06/13/2021 Tumor Marker   Patient's tumor was tested for the following markers: CA-125. Results of the tumor marker test revealed 10.1.   07/11/2021 Tumor Marker   Patient's tumor was tested for the following markers: CA-125. Results of the tumor marker test revealed 9.4.   09/08/2021 Imaging   1. Status post hysterectomy and omentectomy. 2. No evidence of lymphadenopathy or metastatic disease in the abdomen or pelvis.     09/08/2021 Tumor Marker   Patient's tumor was tested for the following markers: CA-125. Results of the tumor marker test revealed 9.0.   02/22/2022 Imaging   IMPRESSION: 1. No acute findings.  No ascites, mass or adenopathy. 2.  Aortic Atherosclerosis (ICD10-170.0).   03/10/2022 Tumor Marker   Patient's tumor was tested for the following markers: CA-125. Results of the tumor marker test revealed 11.7.   06/09/2022 Tumor Marker   Patient's tumor was tested for the following markers: CA-125. Results of the tumor marker test revealed 29.2.   09/07/2022 Tumor  Marker   Patient's tumor was tested for the following markers: CA-125. Results of the tumor marker test revealed 39.7.   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 RADIATION DOSE REDUCTION: This exam was performed according to the departmental dose-optimization program which includes automated exposure control, adjustment of the mA and/or kV according to patient size and/or use of iterative reconstruction technique. CONTRAST:  60mL OMNIPAQUE  IOHEXOL  350 MG/ML SOLN COMPARISON:  06/22/2022 and previous FINDINGS: Lower chest: No pleural or pericardial effusion. Visualized lung bases grossly clear, with some motion degradation. Hepatobiliary: No focal liver abnormality is seen. No gallstones, gallbladder wall thickening, or biliary dilatation. Pancreas: Unremarkable. No pancreatic ductal dilatation or surrounding inflammatory changes. Spleen: Normal in size without focal abnormality. Accessory splenule. Adrenals/Urinary Tract: No adrenal mass. Symmetric renal parenchymal enhancement without urolithiasis or hydronephrosis. Urinary bladder incompletely distended. Stomach/Bowel: Stomach incompletely distended, unremarkable. Small bowel decompressed. Post appendectomy. Gas and fecal material partially distend the colon, without acute finding. A few scattered diverticula in the sigmoid segment. Vascular/Lymphatic: Minimal calcified aortic plaque without aneurysm or stenosis. Portal vein patent. A few scattered mesenteric lymph nodes none greater than 4 mm short axis diameter. No abdominal or pelvic adenopathy. Reproductive: Status post hysterectomy. No adnexal masses. No peritoneal nodularity. Other: No ascites. No free air. Bilateral pelvic phleboliths stable. Musculoskeletal: Stable lower lumbar spondylitic change with early grade and grade 1 anterolisthesis L4-5 and degenerative disc disease L5-S1. No fracture or worrisome bone lesion. IMPRESSION: 1. No acute findings. 2. No evidence of recurrent or metastatic disease. 3.  Sigmoid diverticulosis. Electronically Signed   By: JONETTA Faes M.D.   On: 09/15/2022 20:09      11/24/2022 Tumor Marker   Patient's tumor was tested for the following markers: CA-125. Results of the tumor marker test revealed 47.6.   12/04/2022 Imaging   CT A/P 1. A new 2.4 x 2.1 cm low attenuating structure in the right pelvis abutting the vaginal cuff concerning for recurrent disease, given rise in CA 125. Clinical correlation is recommended. 2. No bowel obstruction. Appendectomy. 3.  Aortic Atherosclerosis (ICD10-I70.0).   12/20/2022 Surgery   Robotic-assisted excision of pelvic mass requiring colpotomy with vaginal cuff closure Carletta), excision of left para-colic gutter implant (White), low anterior resection with reanastomosis Isabel), cystoscopy Carletta)   Findings: On EUA, 2-3 cm mass is palpable in the vaginal apex, and close proximity to the rectum but without direct involvement.  Intra-abdominal entry, normal-appearing diaphragm, liver edge, and stomach.  No peritoneal disease or implants noted.  Normal-appearing small bowel.  Surgically absent uterus, cervix, fallopian tubes and ovaries.  Small, 5 mm cystic nodule noted along the left paracolic gutter lateral to the sigmoid colon (excised).  Within the pelvis, there was a 3 cm cystic mass, apparently bilobed, adherent to the posterior aspect of the vaginal cuff in the midline into the right.  This was close to but not directly involving the rectum nor much of the rectal mesentery.  There was minimal spillage from the cystic lesion during manipulation to excise the specimen.  Upon resection of this area, the more superior aspect of the rectum was identified as having a similar appearing 1-1.5 cm cystic mass involving the rectum and adjacent mesentery.  The entire lesion was removed  within the segment of rectosigmoid colon.  No additional implants or findings concerning for metastatic disease were noted in the abdomen or pelvis. R0  resection. On cystoscopy, bladder dome intact. Good efflux noted from bilateral ureteral orifices.   12/20/2022 Pathology Results   A. PELVIC MASS, EXCISION:  - High-grade serous carcinoma, involving vaginal mucosa and adjacent  soft tissue   B. LEFT PERICOLIC GUTTER NODULE, EXCISION:  - High-grade serous carcinoma  - Resection margin is negative for carcinoma   C. RECTOSIGMOID COLON, RESECTION:  - High-grade serous carcinoma involving segment of rectosigmoid colon  - Lymph node, negative for carcinoma (0/1)  - Resection margins, negative for carcinoma   D. FINAL DISTAL MARGIN OF ANASTOMOTIC RING, EXCISION:  - Colonic donut, negative for carcinoma   Cytology FINAL MICROSCOPIC DIAGNOSIS:  - Malignant cells present  - See comment     01/19/2023 - 05/04/2023 Chemotherapy   Patient is on Treatment Plan : OVARIAN Carboplatin  (AUC 6) + Paclitaxel  (175) q21d X 6 Cycles     01/19/2023 Tumor Marker   Patient's tumor was tested for the following markers: CA-125. Results of the tumor marker test revealed 11.2.   02/12/2023 Tumor Marker   Patient's tumor was tested for the following markers: CA-125. Results of the tumor marker test revealed 9.9.   03/05/2023 Tumor Marker   Patient's tumor was tested for the following markers: CA-125. Results of the tumor marker test revealed 11.7.   03/21/2023 Tumor Marker   Patient's tumor was tested for the following markers: CA-125. Results of the tumor marker test revealed 10.2.   04/16/2023 Tumor Marker   Patient's tumor was tested for the following markers: CA-125. Results of the tumor marker test revealed 10.4.   06/04/2023 Imaging   CT ABDOMEN PELVIS W CONTRAST Result Date: 06/05/2023 CLINICAL DATA:  History of ovarian carcinoma, assess treatment response EXAM: CT ABDOMEN AND PELVIS WITH CONTRAST TECHNIQUE: Multidetector CT imaging of the abdomen and pelvis was performed using the standard protocol following bolus administration of  intravenous contrast. RADIATION DOSE REDUCTION: This exam was performed according to the departmental dose-optimization program which includes automated exposure control, adjustment of the mA and/or kV according to patient size and/or use of iterative reconstruction technique. CONTRAST:  OMNIPAQUE  IOHEXOL  300 MG/ML  SOLN COMPARISON:  12/04/2022 and previous FINDINGS: Lower chest: No pleural or pericardial effusion. Visualized lung bases clear. Hepatobiliary: No focal liver abnormality is seen. No gallstones, gallbladder wall thickening, or biliary dilatation. Pancreas: Unremarkable. No pancreatic ductal dilatation or surrounding inflammatory changes. Spleen: Normal in size without focal abnormality. Small accessory splenule, stable. Adrenals/Urinary Tract: No adrenal mass. Symmetric renal parenchymal enhancement without focal lesion, hydronephrosis, or evident urolithiasis. Urinary bladder nondistended. Stomach/Bowel: Stomach incompletely distended, unremarkable. Small bowel nondistended. Post appendectomy. The colon is partially distended. Anastomotic staple line at the rectosigmoid junction. Vascular/Lymphatic: Minimal calcified aortic plaque without aneurysm. Patent portal and renal veins. No abdominal or pelvic adenopathy. Reproductive: Status post hysterectomy. No adnexal masses. The cystic process previously described in the region of the vaginal cuff on the right is no longer visualized. Other: Left pelvic phlebolith.  No ascites.  No free air. Musculoskeletal: Mild spondylitic changes throughout the lumbar spine. Grade 1 anterolisthesis L4-5 attributed to bilateral facet DJD. IMPRESSION: 1. No acute findings. 2. Interval resolution of the cystic process at the vaginal cuff. 3. No evidence of recurrent or metastatic disease. Electronically Signed   By: JONETTA Faes M.D.   On: 06/05/2023 13:20      06/05/2023  Tumor Marker   Patient's tumor was tested for the following markers: CA-125. Results of the  tumor marker test revealed 9.5.   06/30/2023 Tumor Marker   Patient's tumor was tested for the following markers: CA-125. Results of the tumor marker test revealed 8.3.   07/10/2023 Tumor Marker   Patient's tumor was tested for the following markers: CA-`125. Results of the tumor marker test revealed 10.7.   09/25/2023 Imaging   CT ABDOMEN PELVIS W CONTRAST Result Date: 09/25/2023 CLINICAL DATA:  Ovarian cancer. In remission since February. * Tracking Code: BO * EXAM: CT ABDOMEN AND PELVIS WITH CONTRAST TECHNIQUE: Multidetector CT imaging of the abdomen and pelvis was performed using the standard protocol following bolus administration of intravenous contrast. RADIATION DOSE REDUCTION: This exam was performed according to the departmental dose-optimization program which includes automated exposure control, adjustment of the mA and/or kV according to patient size and/or use of iterative reconstruction technique. CONTRAST:  80mL OMNIPAQUE  IOHEXOL  300 MG/ML  SOLN COMPARISON:  06/04/2023 FINDINGS: Lower chest: Clear lung bases. Normal heart size without pericardial or pleural effusion. Hepatobiliary: Normal liver. Normal gallbladder, without biliary ductal dilatation. Pancreas: Normal, without mass or ductal dilatation. Spleen: Normal in size, without focal abnormality. Adrenals/Urinary Tract: Normal adrenal glands. Normal kidneys, without hydronephrosis. Normal urinary bladder. Stomach/Bowel: Normal stomach, without wall thickening. Surgical sutures in the rectum. Colonic stool burden suggests constipation. Normal small bowel. Vascular/Lymphatic: Aortic atherosclerosis. No abdominopelvic adenopathy. Reproductive: Hysterectomy.  No adnexal mass. Other: No significant free fluid. No evidence of omental or peritoneal disease. Musculoskeletal: Osteopenia.  Lumbosacral spondylosis. IMPRESSION: 1. No acute process or evidence of metastatic disease in the abdomen or pelvis. 2. Incidental findings, including: Possible  constipation. Aortic Atherosclerosis (ICD10-I70.0). Electronically Signed   By: Rockey Kilts M.D.   On: 09/25/2023 12:39   MM 3D SCREENING MAMMOGRAM BILATERAL BREAST Result Date: 08/29/2023 CLINICAL DATA:  Screening. EXAM: DIGITAL SCREENING BILATERAL MAMMOGRAM WITH TOMOSYNTHESIS AND CAD TECHNIQUE: Bilateral screening digital craniocaudal and mediolateral oblique mammograms were obtained. Bilateral screening digital breast tomosynthesis was performed. The images were evaluated with computer-aided detection. COMPARISON:  Previous exam(s). ACR Breast Density Category b: There are scattered areas of fibroglandular density. FINDINGS: There are no findings suspicious for malignancy. IMPRESSION: No mammographic evidence of malignancy. A result letter of this screening mammogram will be mailed directly to the patient. RECOMMENDATION: Screening mammogram in one year. (Code:[id number]-B-01Y) BI-RADS CATEGORY  1: Negative. Electronically Signed   By: Dirk Arrant M.D.   On: 08/29/2023 13:52      09/26/2023 Tumor Marker   Patient's tumor was tested for the following markers: CA-125. Results of the tumor marker test revealed 9.4.   11/23/2023 Tumor Marker   Patient's tumor was tested for the following markers: CA-125. Results of the tumor marker test revealed 11.6.    Started niraparib  in 06/2023 after secondary debulking surgery and adjuvant carbo/taxol (6) in the setting of recurrent platinum-sensitive disease.  Interval History: Overall doing well.  Notes that nausea she was having at the higher dose of the PARP inhibitor has resolved with the lower dose.  Continues to have some intermittent right upper quadrant discomfort, unchanged.  Also has intermittent right pelvic discomfort.  This is in the same location where she has felt pain or cramping intermittently since her ectopic pregnancy.  Notes some intermittent constipation.  She describes this as having regular bowel function daily for 2 or 3 days and then going  1-2 days without a bowel movement.  When this happens, she takes a  laxative and very quickly has results.  Past Medical/Surgical History: Past Medical History:  Diagnosis Date   Anemia    Cancer (HCC)    ovarian   Family history of breast cancer 03/14/2021   Hole, retinal    Right eye   Osteoporosis    Polyp of colon, unspecified part of colon, unspecified type    Benign    Past Surgical History:  Procedure Laterality Date   APPENDECTOMY  02/27/2007   lsc   BILATERAL SALPINGECTOMY Right 03/1978   right ectopic, tied other tube   BOWEL RESECTION N/A 12/20/2022   Procedure: RECTOSIGMOID RESECTION AND REANASTAMOSIS, MINI LAPAROTOMY;  Surgeon: Viktoria Robin SAUNDERS, MD;  Location: [facility];  Service: Gynecology;  Laterality: N/A;   CATARACT EXTRACTION Right 10/31/2011   Dr. Octavia   COLONOSCOPY  03/2019   Dr. Elicia   COLONOSCOPY WITH PROPOFOL  N/A 04/01/2018   Procedure: COLONOSCOPY WITH PROPOFOL ;  Surgeon: Elicia Claw, MD;  Location: [facility];  Service: Gastroenterology;  Laterality: N/A;   CYSTOSCOPY  12/20/2022   Procedure: CYSTOSCOPY;  Surgeon: Viktoria Robin SAUNDERS, MD;  Location: [facility];  Service: Gynecology;;   DILATION AND CURETTAGE OF UTERUS     x3, 2 for miscarriages, one for polyp vs fibroid   HAND / FINGER LESION EXCISION Left    lt. index finger   LAPAROSCOPIC HYSTERECTOMY     LAPAROSCOPY N/A 11/11/2020   Procedure: LAPAROSCOPY DIAGNOSTIC WITH PERITONEAL BIOPSY;  Surgeon: Viktoria Robin SAUNDERS, MD;  Location: [facility];  Service: Gynecology;  Laterality: N/A;   POLYPECTOMY  04/01/2018   Procedure: POLYPECTOMY;  Surgeon: Elicia Claw, MD;  Location: [facility];  Service: Gastroenterology;;   RETINAL TEAR REPAIR CRYOTHERAPY Right 04/25/2011   Dr. Elner   ROBOT ASSISTED MYOMECTOMY N/A 12/20/2022   Procedure: DIAGNOSTIC LAPAROSCOPY, XI ROBOTIC ASSISTED PELVIC MASS EXCISION;  Surgeon: Viktoria Robin SAUNDERS, MD;  Location: [facility];  Service: Gynecology;  Laterality:  N/A;   SUBMUCOSAL INJECTION  04/01/2018   Procedure: SUBMUCOSAL INJECTION;  Surgeon: Elicia Claw, MD;  Location: [facility];  Service: Gastroenterology;;   TUBAL LIGATION Left 03/1978    Family History  Problem Relation Age of Onset   Lung cancer Mother 78       smoking hx   Skin cancer Father        dx after 50; non-melanoma; scalp   Lung cancer Maternal Aunt        dx after 50; smoking hx   Breast cancer Daughter 40       ER+   Skin cancer Cousin        face; surgery only   Ovarian cancer Neg Hx    Endometrial cancer Neg Hx    Prostate cancer Neg Hx    Pancreatic cancer Neg Hx    Colon cancer Neg Hx     Social History   Socioeconomic History   Marital status: Legally Separated    Spouse name: Not on file   Number of children: Not on file   Years of education: Not on file   Highest education level: Not on file  Occupational History   Not on file  Tobacco Use   Smoking status: Never    Passive exposure: Past (18 years)   Smokeless tobacco: Never  Vaping Use   Vaping status: Never Used  Substance and Sexual Activity   Alcohol use: Not Currently   Drug use: Never   Sexual activity: Not Currently  Other Topics Concern   Not on file  Social History Narrative   Not on file   Social Drivers of Health   Financial Resource Strain: Not on file  Food Insecurity: No Food Insecurity (12/18/2023)   Hunger Vital Sign    Worried About Running Out of Food in the Last Year: Never true    Ran Out of Food in the Last Year: Never true  Transportation Needs: No Transportation Needs (12/18/2023)   PRAPARE - Administrator, Civil Service (Medical): No    Lack of Transportation (Non-Medical): No  Physical Activity: Not on file  Stress: Not on file  Social Connections: Not on file    Current Medications:  Current Outpatient Medications:    niraparib  tosylate (ZEJULA ) 100 MG tablet, Take 1 tablet (100 mg total) by mouth daily. May take at bedtime to reduce  nausea and vomiting., Disp: 30 tablet, Rfl: 11   ondansetron  (ZOFRAN ) 4 MG tablet, Take 1-2 tablets (4-8 mg total) by mouth every 8 (eight) hours as needed for nausea or vomiting. (Patient not taking: Reported on 12/18/2023), Disp: 10 tablet, Rfl: 0   prochlorperazine  (COMPAZINE ) 10 MG tablet, Take 1 tablet (10 mg total) by mouth every 6 (six) hours as needed for nausea or vomiting. (Patient not taking: Reported on 12/18/2023), Disp: 30 tablet, Rfl: 1   risedronate  (ACTONEL ) 150 MG tablet, Take 1 tablet (150 mg total) by mouth once monthly (Patient not taking: Reported on 12/18/2023), Disp: 3 tablet, Rfl: 4  Review of Systems: Pertinent positives as per HPI Denies appetite changes, fevers, chills, fatigue, unexplained weight changes. Denies hearing loss, neck lumps or masses, mouth sores, ringing in ears or voice changes. Denies cough or wheezing.  Denies shortness of breath. Denies chest pain or palpitations. Denies leg swelling. Denies abdominal distention, blood in stools, diarrhea, nausea, vomiting, or early satiety. Denies pain with intercourse, dysuria, frequency, hematuria or incontinence. Denies hot flashes, vaginal bleeding or vaginal discharge.   Denies joint pain, back pain or muscle pain/cramps. Denies itching, rash, or wounds. Denies dizziness, headaches, numbness or seizures. Denies swollen lymph nodes or glands, denies easy bruising or bleeding. Denies anxiety, depression, confusion, or decreased concentration.  Physical Exam: BP (!) 143/67 (BP Location: Left Arm, Patient Position: Sitting)   Pulse 84   Temp 98.4 F (36.9 C) (Oral)   Wt 113 lb 6.4 oz (51.4 kg)   SpO2 100%   BMI 19.47 kg/m  General: Alert, oriented, no acute distress. HEENT: Normocephalic, atraumatic, sclera anicteric. Chest: Unlabored breathing on room air.  Lungs clear to auscultation bilaterally.  No wheezes or rhonchi. Cardiovascular: Regular rate and rhythm, no murmurs or rubs appreciated. Abdomen:  soft, nontender.  Unable to induce tenderness with palpation in the right upper quadrant.  Normoactive bowel sounds.  No masses or hepatosplenomegaly appreciated.  Well-healed incisions. Extremities: Grossly normal range of motion.  Warm, well perfused.  No edema bilaterally. Phytex: No cervical, supraclavicular, or inguinal adenopathy. GU: Normal appearing external genitalia without erythema, excoriation, or lesions.  Speculum exam reveals cuff intact, moderate atrophy.  On bimanual exam, cuff is intact, no masses or nodularity.  This is confirmed on rectovaginal exam.  Laboratory & Radiologic Studies: CT A/P 09/25/23: 1. No acute process or evidence of metastatic disease in the abdomen or pelvis. 2. Incidental findings, including: Possible constipation. Aortic Atherosclerosis (ICD10-I70.0).           Component Ref Range & Units (hover) 4 wk ago (11/22/23) 2 mo ago (09/25/23) 5 mo ago (07/09/23) 5 mo ago (06/29/23) 6 mo  ago (06/04/23) 7 mo ago (05/04/23) 8 mo ago (04/13/23)  Cancer Antigen (CA) 125 11.6 9.4 CM 10.7 CM 8.3 CM 9.5 CM 9.8 CM 10.4      Assessment & Plan: Robin Sellers is a 73 y.o. 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 Initially treated with NACT, R0 IDS in 2022, completed adjuvant chermotherapy in 04/2021.  Recurrence noted in the pelvis, now status post secondary debulking surgery (11/2022) with resection of 3 separate lesions, completely excised.  Completed adjuvant carboplatin  and paclitaxel , 6 cycles, 04/2023. Imaging 05/2023 with no metastatic disease. [facility]: ER+, HRD neg, PDL1 CPS 5, P53 abn. ERBB2 negative (IHC 0). FOLR1 40% (negative).  Doing well.  NED on exam today. Tolerating PARP inhibitor without significant side effects.  She is scheduled for imaging next month.  She sees Dr. Lonn on 9/9.  I will plan to see her back in 4 months.  Discussed her right upper quadrant pain.  She has had this since surgery.  We have previously  discussed that this may be related to scar tissue.  If upcoming CT scan does not show any obvious cause for this, discussed possibility of referral to physical therapy to see if massage would be helpful.  Might also benefit from discussion of whether trigger point injection or dry needling could be beneficial.  22 minutes of total time was spent for this patient encounter, including preparation, face-to-face counseling with the patient and coordination of care, and documentation of the encounter.  Robin Dollar, MD  Division of Gynecologic Oncology  Department of Obstetrics and Gynecology  [facility]

## 2023-12-21 NOTE — Patient Instructions (Signed)
 It was good to see you today.  I do not see or feel any evidence of cancer recurrence on your exam.  I will see you for follow-up in 4 months.  After get your CT results, if you be interested in meeting with a physical therapist as we discussed today, please send me a MyChart message.  As always, if you develop any new and concerning symptoms before your next visit, please call to see me sooner.

## 2023-12-26 ENCOUNTER — Ambulatory Visit (HOSPITAL_BASED_OUTPATIENT_CLINIC_OR_DEPARTMENT_OTHER)
Admission: RE | Admit: 2023-12-26 | Discharge: 2023-12-26 | Disposition: A | Discharge status: Home / Self Care | Source: Ambulatory Visit | Attending: Hematology and Oncology | Admitting: Hematology and Oncology

## 2023-12-26 DIAGNOSIS — C482 Malignant neoplasm of peritoneum, unspecified: Secondary | ICD-10-CM | POA: Insufficient documentation

## 2023-12-26 DIAGNOSIS — C569 Malignant neoplasm of unspecified ovary: Secondary | ICD-10-CM | POA: Insufficient documentation

## 2023-12-26 MED ORDER — IOHEXOL 300 MG/ML  SOLN
100.0000 mL | Freq: Once | INTRAMUSCULAR | Status: AC | PRN
  Administered 2023-12-26: 75 mL via INTRAVENOUS

## 2024-01-01 ENCOUNTER — Inpatient Hospital Stay: Attending: Hematology and Oncology | Admitting: Hematology and Oncology

## 2024-01-01 ENCOUNTER — Encounter: Payer: Self-pay | Admitting: Hematology and Oncology

## 2024-01-01 VITALS — BP 139/82 | HR 78 | Temp 97.4°F | Resp 18 | Ht 64.0 in | Wt 115.0 lb

## 2024-01-01 DIAGNOSIS — K5909 Other constipation: Secondary | ICD-10-CM

## 2024-01-01 DIAGNOSIS — Z8543 Personal history of malignant neoplasm of ovary: Secondary | ICD-10-CM | POA: Diagnosis present

## 2024-01-01 DIAGNOSIS — C482 Malignant neoplasm of peritoneum, unspecified: Secondary | ICD-10-CM

## 2024-01-01 DIAGNOSIS — C786 Secondary malignant neoplasm of retroperitoneum and peritoneum: Secondary | ICD-10-CM | POA: Diagnosis present

## 2024-01-01 NOTE — Progress Notes (Signed)
  Cancer Center OFFICE PROGRESS NOTE  Patient Care Team: Default, Provider, MD as PCP - General  Assessment & Plan Primary peritoneal carcinomatosis [facility]) The patient was initially diagnosed with primary peritoneal carcinomatosis in July 2022 Due to locally advanced disease, she was deemed not a surgical candidate She received neoadjuvant chemotherapy with excellent response to therapy followed by interval debulking surgery and subsequent completion of adjuvant treatment by January 2023 Pathology: Neg genetics, Molecular testing through [facility] in 2024: HER2/neu 0, negative, F0 LR 2+, 40%, negative, PD-L1 CPS score 5%, ER +75%, HRD negative  By February 2024, she was noted to have rising tumor marker.  Imaging study in May 2024 showed no evidence of disease but repeat imaging study by August 2024 show evidence of recurrence in the vaginal cuff, amenable to surgical resection The patient is given 6 more cycles of adjuvant chemotherapy with combination of carboplatin  and paclitaxel , completed by January 2025, imaging study from February 2025 showed complete response  She started niraparib  on June 23, 2023 Due to side effects, niraparib  dose was reduced to 100 mg which she tolerated well Imaging study from September 2025 was reviewed with the patient which showed no evidence of disease She will continue niraparib  indefinitely Due to high risk for cancer recurrence, we will continue serial imaging study every 3 months, next imaging will be in December  Other constipation I suspect her recent intermittent abdominal discomfort could be due to constipation We discussed importance of adequate hydration and laxatives  No orders of the defined types were placed in this encounter.    Almarie Bedford, MD  INTERVAL HISTORY: she returns for treatment follow-up on Zejula  Complications related to previous cycle of chemotherapy included none, except for intermittent constipation We discussed  labs, review of CT imaging and discussed importance of influenza vaccination  PHYSICAL EXAMINATION: ECOG PERFORMANCE STATUS: 0 - Asymptomatic  Lab Results  Component Value Date   CAN125 11.6 11/22/2023   CAN125 9.4 09/25/2023   CAN125 10.7 07/09/2023      Latest Ref Rng & Units 11/22/2023    1:59 PM 09/25/2023    8:54 AM 08/13/2023    2:26 PM  CBC  WBC 4.0 - 10.5 K/uL 6.1  7.4  6.5   Hemoglobin 12.0 - 15.0 g/dL 85.5  87.0  88.0   Hematocrit 36.0 - 46.0 % 41.6  37.7  34.4   Platelets 150 - 400 K/uL 177  157  150       Chemistry      Component Value Date/Time   NA 140 11/22/2023 1359   K 4.2 11/22/2023 1359   CL 99 11/22/2023 1359   CO2 34 (H) 11/22/2023 1359   BUN 25 (H) 11/22/2023 1359   CREATININE 0.90 11/22/2023 1359   CREATININE 0.76 04/13/2023 0828      Component Value Date/Time   CALCIUM 9.9 11/22/2023 1359   ALKPHOS 52 11/22/2023 1359   AST 23 11/22/2023 1359   AST 17 04/13/2023 0828   ALT 16 11/22/2023 1359   ALT 12 04/13/2023 0828   BILITOT 0.6 11/22/2023 1359   BILITOT 0.4 04/13/2023 0828       Vitals:   01/01/24 1007  BP: 139/82  Pulse: 78  Resp: 18  Temp: (!) 97.4 F (36.3 C)  SpO2: 100%   Filed Weights   01/01/24 1007  Weight: 115 lb (52.2 kg)   Other relevant data reviewed during this visit included CBC, CMP, CT imaging from September 2025

## 2024-01-01 NOTE — Assessment & Plan Note (Addendum)
 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  Imaging study in May 2024 showed no evidence of disease but repeat imaging study by August 2024 show evidence of recurrence in the vaginal cuff, amenable to surgical resection The patient is given 6 more cycles of adjuvant chemotherapy with combination of carboplatin  and paclitaxel , completed by January 2025, imaging study from February 2025 showed complete response  She started niraparib  on June 23, 2023 Due to side effects, niraparib  dose was reduced to 100 mg which she tolerated well Imaging study from September 2025 was reviewed with the patient which showed no evidence of disease She will continue niraparib  indefinitely Due to high risk for cancer recurrence, we will continue serial imaging study every 3 months, next imaging will be in December

## 2024-01-01 NOTE — Assessment & Plan Note (Addendum)
 I suspect her recent intermittent abdominal discomfort could be due to constipation We discussed importance of adequate hydration and laxatives

## 2024-01-11 ENCOUNTER — Other Ambulatory Visit (HOSPITAL_COMMUNITY): Payer: Self-pay

## 2024-01-14 ENCOUNTER — Other Ambulatory Visit: Payer: Self-pay

## 2024-01-14 ENCOUNTER — Encounter (INDEPENDENT_AMBULATORY_CARE_PROVIDER_SITE_OTHER): Payer: Self-pay

## 2024-01-14 NOTE — Progress Notes (Signed)
 Specialty Pharmacy Refill Coordination Note  Robin Sellers is a 73 y.o. female contacted today regarding refills of specialty medication(s) Niraparib  Tosylate (ZEJULA )   Patient requested (Patient-Rptd) Pickup at [facility] at [facility] date: (Patient-Rptd) 01/18/24   Medication will be filled on 01/17/24.

## 2024-01-16 ENCOUNTER — Other Ambulatory Visit: Payer: Self-pay

## 2024-01-21 ENCOUNTER — Other Ambulatory Visit: Payer: Self-pay

## 2024-01-21 NOTE — Progress Notes (Signed)
 Specialty Pharmacy Ongoing Clinical Assessment Note  [name] is a 73 y.o. female who is being followed by the specialty pharmacy service for RxSp Oncology   Patient's specialty medication(s) reviewed today: Niraparib  Tosylate (ZEJULA )   Missed doses in the last 4 weeks: 0   Patient/Caregiver did not have any additional questions or concerns.   Therapeutic benefit summary: Patient is achieving benefit   Adverse events/side effects summary: No adverse events/side effects   Patient's therapy is appropriate to: Continue    Goals Addressed             This Visit's Progress    Maintain optimal adherence to therapy   On track    Patient is on track. Patient will maintain adherence         Follow up: 3 months  Silvano LOISE Dolly Specialty Pharmacist

## 2024-02-13 ENCOUNTER — Other Ambulatory Visit: Payer: Self-pay

## 2024-02-13 ENCOUNTER — Encounter (INDEPENDENT_AMBULATORY_CARE_PROVIDER_SITE_OTHER): Payer: Self-pay

## 2024-02-13 NOTE — Progress Notes (Signed)
 Specialty Pharmacy Refill Coordination Note  Robin Sellers is a 73 y.o. female contacted today regarding refills of specialty medication(s) Niraparib  Tosylate (ZEJULA )   Patient requested (Patient-Rptd) Pickup at [facility] at [facility] date: (Patient-Rptd) 02/21/24   Medication will be filled on 02/20/24.

## 2024-02-20 ENCOUNTER — Other Ambulatory Visit: Payer: Self-pay

## 2024-03-03 ENCOUNTER — Inpatient Hospital Stay (HOSPITAL_BASED_OUTPATIENT_CLINIC_OR_DEPARTMENT_OTHER): Admitting: Hematology and Oncology

## 2024-03-03 ENCOUNTER — Inpatient Hospital Stay: Attending: Hematology and Oncology

## 2024-03-03 ENCOUNTER — Encounter: Payer: Self-pay | Admitting: Hematology and Oncology

## 2024-03-03 VITALS — BP 149/68 | HR 82 | Temp 97.9°F | Resp 18 | Ht 64.0 in | Wt 112.6 lb

## 2024-03-03 DIAGNOSIS — C482 Malignant neoplasm of peritoneum, unspecified: Secondary | ICD-10-CM | POA: Diagnosis not present

## 2024-03-03 DIAGNOSIS — Z79899 Other long term (current) drug therapy: Secondary | ICD-10-CM | POA: Insufficient documentation

## 2024-03-03 DIAGNOSIS — Z9221 Personal history of antineoplastic chemotherapy: Secondary | ICD-10-CM | POA: Insufficient documentation

## 2024-03-03 LAB — CBC WITH DIFFERENTIAL/PLATELET
Abs Immature Granulocytes: 0.01 K/uL (ref 0.00–0.07)
Basophils Absolute: 0 K/uL (ref 0.0–0.1)
Basophils Relative: 0 %
Eosinophils Absolute: 0.1 K/uL (ref 0.0–0.5)
Eosinophils Relative: 2 %
HCT: 42.7 % (ref 36.0–46.0)
Hemoglobin: 14.3 g/dL (ref 12.0–15.0)
Immature Granulocytes: 0 %
Lymphocytes Relative: 30 %
Lymphs Abs: 1.4 K/uL (ref 0.7–4.0)
MCH: 30.6 pg (ref 26.0–34.0)
MCHC: 33.5 g/dL (ref 30.0–36.0)
MCV: 91.2 fL (ref 80.0–100.0)
Monocytes Absolute: 0.3 K/uL (ref 0.1–1.0)
Monocytes Relative: 6 %
Neutro Abs: 2.8 K/uL (ref 1.7–7.7)
Neutrophils Relative %: 62 %
Platelets: 170 K/uL (ref 150–400)
RBC: 4.68 MIL/uL (ref 3.87–5.11)
RDW: 12.4 % (ref 11.5–15.5)
WBC: 4.6 K/uL (ref 4.0–10.5)
nRBC: 0 % (ref 0.0–0.2)

## 2024-03-03 LAB — COMPREHENSIVE METABOLIC PANEL WITH GFR
ALT: 14 U/L (ref 0–44)
AST: 21 U/L (ref 15–41)
Albumin: 4.8 g/dL (ref 3.5–5.0)
Alkaline Phosphatase: 45 U/L (ref 38–126)
Anion gap: 7 (ref 5–15)
BUN: 15 mg/dL (ref 8–23)
CO2: 30 mmol/L (ref 22–32)
Calcium: 9.6 mg/dL (ref 8.9–10.3)
Chloride: 104 mmol/L (ref 98–111)
Creatinine, Ser: 0.99 mg/dL (ref 0.44–1.00)
GFR, Estimated: >60 mL/min (ref >60)
Glucose, Bld: 102 mg/dL — ABNORMAL HIGH (ref 70–99)
Potassium: 4.5 mmol/L (ref 3.5–5.1)
Sodium: 141 mmol/L (ref 135–145)
Total Bilirubin: 0.5 mg/dL (ref 0.0–1.2)
Total Protein: 7.5 g/dL (ref 6.5–8.1)

## 2024-03-03 NOTE — Assessment & Plan Note (Addendum)
 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  Imaging study in May 2024 showed no evidence of disease but repeat imaging study by August 2024 show evidence of recurrence in the vaginal cuff, amenable to surgical resection The patient is given 6 more cycles of adjuvant chemotherapy with combination of carboplatin  and paclitaxel , completed by January 2025, imaging study from February 2025 showed complete response  She started niraparib  on June 23, 2023 Due to side effects, niraparib  dose was reduced to 100 mg which she tolerated well Imaging study from September 2025 showed no evidence of disease She will continue niraparib  indefinitely Due to high risk for cancer recurrence, we will continue serial imaging study every 3 months, next imaging will be in December

## 2024-03-03 NOTE — Progress Notes (Signed)
  Cancer Center OFFICE PROGRESS NOTE  Patient Care Team: Default, Provider, MD as PCP - General  Assessment & Plan Primary peritoneal carcinomatosis [facility]) The patient was initially diagnosed with primary peritoneal carcinomatosis in July 2022 Due to locally advanced disease, she was deemed not a surgical candidate She received neoadjuvant chemotherapy with excellent response to therapy followed by interval debulking surgery and subsequent completion of adjuvant treatment by January 2023 Pathology: Neg genetics, Molecular testing through [facility] in 2024: HER2/neu 0, negative, F0 LR 2+, 40%, negative, PD-L1 CPS score 5%, ER +75%, HRD negative  By February 2024, she was noted to have rising tumor marker.  Imaging study in May 2024 showed no evidence of disease but repeat imaging study by August 2024 show evidence of recurrence in the vaginal cuff, amenable to surgical resection The patient is given 6 more cycles of adjuvant chemotherapy with combination of carboplatin  and paclitaxel , completed by January 2025, imaging study from February 2025 showed complete response  She started niraparib  on June 23, 2023 Due to side effects, niraparib  dose was reduced to 100 mg which she tolerated well Imaging study from September 2025 showed no evidence of disease She will continue niraparib  indefinitely Due to high risk for cancer recurrence, we will continue serial imaging study every 3 months, next imaging will be in December   Orders Placed This Encounter  Procedures   Ovarian CT ABD/PELVIS no oral contrast    No need oral contrast    Standing Status:   Future    Expected Date:   04/21/2024    Expiration Date:   03/03/2025    If indicated for the ordered procedure, I authorize the administration of contrast media per Radiology protocol:   Yes    Does the patient have a contrast media/X-ray dye allergy?:   No    Preferred imaging location?:   [facility]    If indicated for the  ordered procedure, I authorize the administration of oral contrast media per Radiology protocol:   No    Reason for no oral contrast::   No need for oral contrast     Robin Bedford, MD  INTERVAL HISTORY: she returns for treatment follow-up Complications related to previous cycle of chemotherapy included none  PHYSICAL EXAMINATION: ECOG PERFORMANCE STATUS: 0 - Asymptomatic  Lab Results  Component Value Date   CAN125 11.6 11/22/2023   CAN125 9.4 09/25/2023   CAN125 10.7 07/09/2023      Latest Ref Rng & Units 03/03/2024    8:22 AM 11/22/2023    1:59 PM 09/25/2023    8:54 AM  CBC  WBC 4.0 - 10.5 K/uL 4.6  6.1  7.4   Hemoglobin 12.0 - 15.0 g/dL 85.6  85.5  87.0   Hematocrit 36.0 - 46.0 % 42.7  41.6  37.7   Platelets 150 - 400 K/uL 170  177  157       Chemistry      Component Value Date/Time   NA 141 03/03/2024 0822   K 4.5 03/03/2024 0822   CL 104 03/03/2024 0822   CO2 30 03/03/2024 0822   BUN 15 03/03/2024 0822   CREATININE 0.99 03/03/2024 0822   CREATININE 0.76 04/13/2023 0828      Component Value Date/Time   CALCIUM 9.6 03/03/2024 0822   ALKPHOS 45 03/03/2024 0822   AST 21 03/03/2024 0822   AST 17 04/13/2023 0828   ALT 14 03/03/2024 0822   ALT 12 04/13/2023 0828   BILITOT 0.5 03/03/2024  9177   BILITOT 0.4 04/13/2023 0828       Vitals:   03/03/24 0905  BP: (!) 149/68  Pulse: 82  Resp: 18  Temp: 97.9 F (36.6 C)  SpO2: 98%   Filed Weights   03/03/24 0905  Weight: 112 lb 9.6 oz (51.1 kg)   Other relevant data reviewed during this visit included CBC and CMP

## 2024-03-05 LAB — CA 125: Cancer Antigen (CA) 125: 11 U/mL (ref 0.0–38.1)

## 2024-03-12 ENCOUNTER — Other Ambulatory Visit: Payer: Self-pay

## 2024-03-13 ENCOUNTER — Other Ambulatory Visit: Payer: Self-pay

## 2024-03-17 ENCOUNTER — Other Ambulatory Visit: Payer: Self-pay

## 2024-03-17 NOTE — Progress Notes (Signed)
 Specialty Pharmacy Refill Coordination Note  Robin Sellers is a 73 y.o. female contacted today regarding refills of specialty medication(s) Niraparib  Tosylate (ZEJULA )   Patient requested [name] at [facility] at [place] date: 03/18/24   Medication will be filled on: 03/17/24

## 2024-03-22 ENCOUNTER — Other Ambulatory Visit (HOSPITAL_COMMUNITY): Payer: Self-pay

## 2024-04-07 ENCOUNTER — Other Ambulatory Visit: Payer: Self-pay

## 2024-04-07 NOTE — Progress Notes (Signed)
 Specialty Pharmacy Ongoing Clinical Assessment Note  Robin Sellers is a 73 y.o. female who is being followed by the specialty pharmacy service for RxSp Oncology   Patient's specialty medication(s) reviewed today: Niraparib  Tosylate (ZEJULA )   Missed doses in the last 4 weeks: 0   Patient/Caregiver did not have any additional questions or concerns.   Therapeutic benefit summary: Patient is achieving benefit   Adverse events/side effects summary: No adverse events/side effects   Patient's therapy is appropriate to: Continue    Goals Addressed             This Visit's Progress    Maintain optimal adherence to therapy   On track    Patient is on track. Patient will maintain adherence         Follow up: 3 months  Robin Sellers M Kiyonna Tortorelli Specialty Pharmacist

## 2024-04-07 NOTE — Progress Notes (Signed)
 Specialty Pharmacy Refill Coordination Note  Robin Sellers is a 73 y.o. female contacted today regarding refills of specialty medication(s) Niraparib  Tosylate (ZEJULA )   Patient requested [name] at [facility] at [place] date: 04/21/24   Medication will be filled on: 04/18/24

## 2024-04-18 ENCOUNTER — Other Ambulatory Visit: Payer: Self-pay

## 2024-04-21 ENCOUNTER — Inpatient Hospital Stay: Attending: Hematology and Oncology

## 2024-04-21 ENCOUNTER — Ambulatory Visit (HOSPITAL_BASED_OUTPATIENT_CLINIC_OR_DEPARTMENT_OTHER)
Admission: RE | Admit: 2024-04-21 | Discharge: 2024-04-21 | Disposition: A | Discharge status: Home / Self Care | Source: Ambulatory Visit | Attending: Hematology and Oncology | Admitting: Hematology and Oncology

## 2024-04-21 DIAGNOSIS — C482 Malignant neoplasm of peritoneum, unspecified: Secondary | ICD-10-CM | POA: Insufficient documentation

## 2024-04-21 LAB — CBC WITH DIFFERENTIAL/PLATELET
Abs Immature Granulocytes: 0.01 K/uL (ref 0.00–0.07)
Basophils Absolute: 0 K/uL (ref 0.0–0.1)
Basophils Relative: 1 %
Eosinophils Absolute: 0.1 K/uL (ref 0.0–0.5)
Eosinophils Relative: 1 %
HCT: 41.1 % (ref 36.0–46.0)
Hemoglobin: 14.2 g/dL (ref 12.0–15.0)
Immature Granulocytes: 0 %
Lymphocytes Relative: 26 %
Lymphs Abs: 1.2 K/uL (ref 0.7–4.0)
MCH: 31.1 pg (ref 26.0–34.0)
MCHC: 34.5 g/dL (ref 30.0–36.0)
MCV: 89.9 fL (ref 80.0–100.0)
Monocytes Absolute: 0.3 K/uL (ref 0.1–1.0)
Monocytes Relative: 7 %
Neutro Abs: 3 K/uL (ref 1.7–7.7)
Neutrophils Relative %: 65 %
Platelets: 163 K/uL (ref 150–400)
RBC: 4.57 MIL/uL (ref 3.87–5.11)
RDW: 12.4 % (ref 11.5–15.5)
WBC: 4.6 K/uL (ref 4.0–10.5)
nRBC: 0 % (ref 0.0–0.2)

## 2024-04-21 LAB — COMPREHENSIVE METABOLIC PANEL WITH GFR
ALT: 18 U/L (ref 0–44)
AST: 28 U/L (ref 15–41)
Albumin: 4.7 g/dL (ref 3.5–5.0)
Alkaline Phosphatase: 49 U/L (ref 38–126)
Anion gap: 11 (ref 5–15)
BUN: 15 mg/dL (ref 8–23)
CO2: 27 mmol/L (ref 22–32)
Calcium: 9.4 mg/dL (ref 8.9–10.3)
Chloride: 102 mmol/L (ref 98–111)
Creatinine, Ser: 0.99 mg/dL (ref 0.44–1.00)
GFR, Estimated: >60 mL/min
Glucose, Bld: 108 mg/dL — ABNORMAL HIGH (ref 70–99)
Potassium: 4.4 mmol/L (ref 3.5–5.1)
Sodium: 139 mmol/L (ref 135–145)
Total Bilirubin: 0.5 mg/dL (ref 0.0–1.2)
Total Protein: 7.3 g/dL (ref 6.5–8.1)

## 2024-04-21 MED ORDER — IOHEXOL 300 MG/ML  SOLN
100.0000 mL | Freq: Once | INTRAMUSCULAR | Status: AC | PRN
  Administered 2024-04-21: 100 mL via INTRAVENOUS

## 2024-04-22 LAB — CA 125: Cancer Antigen (CA) 125: 10 U/mL (ref 0.0–38.1)

## 2024-05-02 ENCOUNTER — Encounter: Payer: Self-pay | Admitting: Gynecologic Oncology

## 2024-05-02 ENCOUNTER — Inpatient Hospital Stay: Attending: Hematology and Oncology | Admitting: Gynecologic Oncology

## 2024-05-02 VITALS — BP 137/63 | HR 86 | Temp 98.4°F | Resp 18 | Wt 115.6 lb

## 2024-05-02 DIAGNOSIS — Z79899 Other long term (current) drug therapy: Secondary | ICD-10-CM | POA: Diagnosis not present

## 2024-05-02 DIAGNOSIS — Z90710 Acquired absence of both cervix and uterus: Secondary | ICD-10-CM | POA: Insufficient documentation

## 2024-05-02 DIAGNOSIS — C482 Malignant neoplasm of peritoneum, unspecified: Secondary | ICD-10-CM | POA: Insufficient documentation

## 2024-05-02 DIAGNOSIS — Z9079 Acquired absence of other genital organ(s): Secondary | ICD-10-CM | POA: Insufficient documentation

## 2024-05-02 DIAGNOSIS — Z90722 Acquired absence of ovaries, bilateral: Secondary | ICD-10-CM | POA: Diagnosis not present

## 2024-05-02 DIAGNOSIS — Z9221 Personal history of antineoplastic chemotherapy: Secondary | ICD-10-CM | POA: Diagnosis not present

## 2024-05-02 NOTE — Patient Instructions (Signed)
 It was good to see you today.  I do not see or feel any evidence of cancer recurrence on your exam.  I will see you for follow-up in May.  As always, if you develop any new and concerning symptoms before your next visit, please call to see me sooner.

## 2024-05-02 NOTE — Progress Notes (Signed)
 Gynecologic Oncology Return Clinic Visit  05/02/2024  Reason for Visit: surveillance   Treatment History: Oncology History Overview Note  Neg genetics,  Molecular testing through [facility] in 2024:  HER2/neu 0, negative F0 LR 2+, 40%, negative PD-L1 CPS score 5% ER +75% HRD negative   Primary peritoneal carcinomatosis (HCC)  08/21/2020 Initial Diagnosis   The patient reports intermittent right upper quadrant pain that feel like she pulled a muscle beginning in March 2022.  In April, she woke up with sharp pain in her right upper quadrant.  This was her first episode of sharp pain.  She thought that her pain was related to her gallbladder and presented to the emergency department.  She was seen in ED on 4/30. RUQ ultrasound and x-ray were normal as were LFTs and lipase.    10/27/2020 Imaging   Findings highly suspicious for peritoneal carcinomatosis. No site of primary malignancy identified.   Colonic diverticulosis, without radiographic evidence of diverticulitis   11/02/2020 Initial Diagnosis   Primary peritoneal carcinomatosis (HCC)   11/02/2020 Tumor Marker   Patient's tumor was tested for the following markers: CA-125. Results of the tumor marker test revealed 88.   11/11/2020 Pathology Results   FINAL MICROSCOPIC DIAGNOSIS:   A. CUL DE SAC, LEFT ANTERIOR, EXCISION:  -  Poorly differentiated carcinoma  -  See comment   B. SIDEWALL, RIGHT, BIOPSY:  -  Poorly differentiated carcinoma  -  See comment   COMMENT:   By immunohistochemistry, the neoplastic cells are positive for cytokeratin 7, p53, PAX8 and WT1 but negative for cytokeratin 20, GATA3, CDX2 and TTF-1.  Overall, the immunophenotype is consistent with a  gynecologic primary.    11/11/2020 Pathology Results   FINAL MICROSCOPIC DIAGNOSIS:  - Malignant cells consistent with metastatic adenocarcinoma    11/11/2020 Surgery   Pre-operative Diagnosis: Carcinomatosis, mildly elevated CA-125   Post-operative Diagnosis: same,  At least stage IIIC gyn malignancy   Operation: Diagnostic laparoscopy, peritoneal biopsies    Surgeon: Viktoria Crank MD Operative Findings: On EUA, small mobile uterus. On intra-abdominal entry, carcinomatosis appreciated studding the right anterior diaphragm, bilateral liver surfaces, mesentery, pelvic peritoneum. Omental cake with measuring up to 2x3cm. Right ovary adherent to the right uterus with peritoneal studding adjacent. Frondular implants noted along right pelvic sidewall. Miliary disease along anterior cul de sac. Cul de sac covered with friable miliary disease. Small volume dark amber ascites. Specimens: left anterior cul de sac peritoneum, right pelvic sidewall peritoneal biopsy          11/15/2020 Cancer Staging   Staging form: Ovary, Fallopian Tube, and Primary Peritoneal Carcinoma, AJCC 8th Edition - Clinical stage from 11/15/2020: FIGO Stage III (cT3, cN0, cM0) - Signed by Lonn Hicks, MD on 11/15/2020 Stage prefix: Initial diagnosis   11/26/2020 - 05/13/2021 Chemotherapy   Patient is on Treatment Plan : OVARIAN Carboplatin  (AUC 6) / Paclitaxel  (175) q21d x 6 cycles     11/26/2020 Tumor Marker   Patient's tumor was tested for the following markers: CA-125. Results of the tumor marker test revealed 76.8.   12/17/2020 Tumor Marker   Patient's tumor was tested for the following markers: CA-125. Results of the tumor marker test revealed 58.1.   01/07/2021 Tumor Marker   Patient's tumor was tested for the following markers: CA-125. Results of the tumor marker test revealed 21.6.   01/20/2021 Imaging   Decreased omental soft tissue nodularity and caking, consistent with interval improvement in carcinoma.   No evidence of new or progressive disease  within the abdomen or pelvis.   Colonic diverticulosis. No radiographic evidence of diverticulitis.   Large stool burden noted; recommend clinical correlation for possible constipation   02/24/2021 Surgery   Robotic-assisted  laparoscopic total hysterectomy with bilateral salpingoophorectomy, peritoneal stripping, mini-lap for omentectomy and intra-abdominal palpation  Findings: On EUA, small mobile uterus. Rectum free. On intra-abdominal entry, normal upper abdominal survey including liver edge, diaphragm and stomach. Omentum with several small (<2cm) areas of thickening, suspected treated tumor versus residual tumor. Uterus 6cm and normal in appearance. Atrophic appearing bilateral adnexa. Some adhesions of the left ovary to the medial leaf of the broad ligament. Minimal peritoneal nodularity versus treated disease within posterior cul de sac and deep left pelvis, all either excised or ablated. No significant peritoneal disease or carcinomatosis. No sigmoid or rectal involvement. Small bowel normal in appearance. No adenopathy. No ascites. R0 resection.    02/24/2021 Pathology Results   A. PERTIONEAL SCAR, EXCISION:  - Microscopic focus of high-grade serous carcinoma   B. 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 OMENTUM, RESECTION:  - Microscopic foci of high-grade serous carcinoma   03/14/2021 Tumor Marker   Patient's tumor was tested for the following markers: CA-125. Results of the tumor marker test revealed 32.7.   03/23/2021 Imaging   Unremarkable right upper quadrant ultrasound   04/01/2021 Genetic Testing   HRD status through [facility] was negative.  The report date is 04/01/2021. Negative hereditary cancer genetic testing: no pathogenic variants detected in [facility].  The report date is 04/04/2021.  The [facility] gene panel offered by [employer] includes sequencing and deletion/duplication testing of the following 48 genes: APC, ATM, AXIN2, BAP1, BARD1,  BMPR1A, BRCA1, BRCA2, BRIP1, CHD1, CDK4, CDKN2A(p16 and p14ARF), CHEK2, CTNNA1, EGFR, EPCAM, FH, FLCN, GREM1, HOXB13, MEN1, MET, MITF , MLH1, MSH2, MSH3, MSH6, MUTYH, NTHL1, PALB2, PMS2, POLD1, POLE, PTEN, RAD51C, RAD51D, RET, SDHA, SDHB, SDHC, SDHD, SMAD4, STK11,TERT, TP53, TSC1, TSC2, and VHL.   04/12/2021 Tumor Marker   Patient's tumor was tested for the following markers: CA-125. Results of the tumor marker test revealed 12.7.   06/13/2021 Imaging   1. No new mass or lymphadenopathy identified in the abdomen or pelvis. Interval resolution of previous omental soft tissue caking. 2. Other ancillary findings.   06/13/2021 Tumor Marker   Patient's tumor was tested for the following markers: CA-125. Results of the tumor marker test revealed 10.1.   07/11/2021 Tumor Marker   Patient's tumor was tested for the following markers: CA-125. Results of the tumor marker test revealed 9.4.   09/08/2021 Imaging   1. Status post hysterectomy and omentectomy. 2. No evidence of lymphadenopathy or metastatic disease in the abdomen or pelvis.     09/08/2021 Tumor Marker   Patient's tumor was tested for the following markers: CA-125. Results of the tumor marker test revealed 9.0.   02/22/2022 Imaging   IMPRESSION: 1. No acute findings.  No ascites, mass or adenopathy. 2.  Aortic Atherosclerosis (ICD10-170.0).   03/10/2022 Tumor Marker   Patient's tumor was tested for the following markers: CA-125. Results of the tumor marker test revealed 11.7.   06/09/2022 Tumor Marker   Patient's tumor was tested for the following markers: CA-125. Results of the tumor marker test revealed 29.2.   09/07/2022  Tumor Marker   Patient's tumor was tested for the following markers: CA-125. Results of the tumor marker test revealed 39.7.   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 RADIATION DOSE REDUCTION: This exam was performed according to the departmental dose-optimization program which includes automated exposure control, adjustment of the mA and/or kV according to patient size and/or use of iterative reconstruction technique. CONTRAST:  60mL OMNIPAQUE  IOHEXOL  350 MG/ML SOLN COMPARISON:  06/22/2022 and previous FINDINGS: Lower chest: No pleural or pericardial effusion. Visualized lung bases grossly clear, with some motion degradation. Hepatobiliary: No focal liver abnormality is seen. No gallstones, gallbladder wall thickening, or biliary dilatation. Pancreas: Unremarkable. No pancreatic ductal dilatation or surrounding inflammatory changes. Spleen: Normal in size without focal abnormality. Accessory splenule. Adrenals/Urinary Tract: No adrenal mass. Symmetric renal parenchymal enhancement without urolithiasis or hydronephrosis. Urinary bladder incompletely distended. Stomach/Bowel: Stomach incompletely distended, unremarkable. Small bowel decompressed. Post appendectomy. Gas and fecal material partially distend the colon, without acute finding. A few scattered diverticula in the sigmoid segment. Vascular/Lymphatic: Minimal calcified aortic plaque without aneurysm or stenosis. Portal vein patent. A few scattered mesenteric lymph nodes none greater than 4 mm short axis diameter. No abdominal or pelvic adenopathy. Reproductive: Status post hysterectomy. No adnexal masses. No peritoneal nodularity. Other: No ascites. No free air. Bilateral pelvic phleboliths stable. Musculoskeletal: Stable lower lumbar spondylitic change with early grade and grade 1 anterolisthesis L4-5 and degenerative disc disease L5-S1. No fracture or worrisome bone lesion. IMPRESSION: 1. No acute findings. 2. No evidence of recurrent or metastatic disease. 3.  Sigmoid diverticulosis. Electronically Signed   By: JONETTA Faes M.D.   On: 09/15/2022 20:09      11/24/2022 Tumor Marker   Patient's tumor was tested for the following markers: CA-125. Results of the tumor marker test revealed 47.6.   12/04/2022 Imaging   CT A/P 1. A new 2.4 x 2.1 cm low attenuating structure in the right pelvis abutting the vaginal cuff concerning for recurrent disease, given rise in CA 125. Clinical correlation is recommended. 2. No bowel obstruction. Appendectomy. 3.  Aortic Atherosclerosis (ICD10-I70.0).   12/20/2022 Surgery   Robotic-assisted excision of pelvic mass requiring colpotomy with vaginal cuff closure Carletta), excision of left para-colic gutter implant (White), low anterior resection with reanastomosis Isabel), cystoscopy Carletta)   Findings: On EUA, 2-3 cm mass is palpable in the vaginal apex, and close proximity to the rectum but without direct involvement.  Intra-abdominal entry, normal-appearing diaphragm, liver edge, and stomach.  No peritoneal disease or implants noted.  Normal-appearing small bowel.  Surgically absent uterus, cervix, fallopian tubes and ovaries.  Small, 5 mm cystic nodule noted along the left paracolic gutter lateral to the sigmoid colon (excised).  Within the pelvis, there was a 3 cm cystic mass, apparently bilobed, adherent to the posterior aspect of the vaginal cuff in the midline into the right.  This was close to but not directly involving the rectum nor much of the rectal mesentery.  There was minimal spillage from the cystic lesion during manipulation to excise the specimen.  Upon resection of this area, the more superior aspect of the rectum was identified as having a similar appearing 1-1.5 cm cystic mass involving the rectum and adjacent mesentery.  The entire lesion was  removed within the segment of rectosigmoid colon.  No additional implants or findings concerning for metastatic disease were noted in the abdomen or pelvis. R0  resection. On cystoscopy, bladder dome intact. Good efflux noted from bilateral ureteral orifices.   12/20/2022 Pathology Results   A. PELVIC MASS, EXCISION:  - High-grade serous carcinoma, involving vaginal mucosa and adjacent  soft tissue   B. LEFT PERICOLIC GUTTER NODULE, EXCISION:  - High-grade serous carcinoma  - Resection margin is negative for carcinoma   C. RECTOSIGMOID COLON, RESECTION:  - High-grade serous carcinoma involving segment of rectosigmoid colon  - Lymph node, negative for carcinoma (0/1)  - Resection margins, negative for carcinoma   D. FINAL DISTAL MARGIN OF ANASTOMOTIC RING, EXCISION:  - Colonic donut, negative for carcinoma   Cytology FINAL MICROSCOPIC DIAGNOSIS:  - Malignant cells present  - See comment     01/19/2023 - 05/04/2023 Chemotherapy   Patient is on Treatment Plan : OVARIAN Carboplatin  (AUC 6) + Paclitaxel  (175) q21d X 6 Cycles     01/19/2023 Tumor Marker   Patient's tumor was tested for the following markers: CA-125. Results of the tumor marker test revealed 11.2.   02/12/2023 Tumor Marker   Patient's tumor was tested for the following markers: CA-125. Results of the tumor marker test revealed 9.9.   03/05/2023 Tumor Marker   Patient's tumor was tested for the following markers: CA-125. Results of the tumor marker test revealed 11.7.   03/21/2023 Tumor Marker   Patient's tumor was tested for the following markers: CA-125. Results of the tumor marker test revealed 10.2.   04/16/2023 Tumor Marker   Patient's tumor was tested for the following markers: CA-125. Results of the tumor marker test revealed 10.4.   06/04/2023 Imaging   CT ABDOMEN PELVIS W CONTRAST Result Date: 06/05/2023 CLINICAL DATA:  History of ovarian carcinoma, assess treatment response EXAM: CT ABDOMEN AND PELVIS WITH CONTRAST TECHNIQUE: Multidetector CT imaging of the abdomen and pelvis was performed using the standard protocol following bolus administration of  intravenous contrast. RADIATION DOSE REDUCTION: This exam was performed according to the departmental dose-optimization program which includes automated exposure control, adjustment of the mA and/or kV according to patient size and/or use of iterative reconstruction technique. CONTRAST:  OMNIPAQUE  IOHEXOL  300 MG/ML  SOLN COMPARISON:  12/04/2022 and previous FINDINGS: Lower chest: No pleural or pericardial effusion. Visualized lung bases clear. Hepatobiliary: No focal liver abnormality is seen. No gallstones, gallbladder wall thickening, or biliary dilatation. Pancreas: Unremarkable. No pancreatic ductal dilatation or surrounding inflammatory changes. Spleen: Normal in size without focal abnormality. Small accessory splenule, stable. Adrenals/Urinary Tract: No adrenal mass. Symmetric renal parenchymal enhancement without focal lesion, hydronephrosis, or evident urolithiasis. Urinary bladder nondistended. Stomach/Bowel: Stomach incompletely distended, unremarkable. Small bowel nondistended. Post appendectomy. The colon is partially distended. Anastomotic staple line at the rectosigmoid junction. Vascular/Lymphatic: Minimal calcified aortic plaque without aneurysm. Patent portal and renal veins. No abdominal or pelvic adenopathy. Reproductive: Status post hysterectomy. No adnexal masses. The cystic process previously described in the region of the vaginal cuff on the right is no longer visualized. Other: Left pelvic phlebolith.  No ascites.  No free air. Musculoskeletal: Mild spondylitic changes throughout the lumbar spine. Grade 1 anterolisthesis L4-5 attributed to bilateral facet DJD. IMPRESSION: 1. No acute findings. 2. Interval resolution of the cystic process at the vaginal cuff. 3. No evidence of recurrent or metastatic disease. Electronically Signed   By: JONETTA Faes M.D.   On: 06/05/2023 13:20  06/05/2023 Tumor Marker   Patient's tumor was tested for the following markers: CA-125. Results of the  tumor marker test revealed 9.5.   06/30/2023 Tumor Marker   Patient's tumor was tested for the following markers: CA-125. Results of the tumor marker test revealed 8.3.   07/10/2023 Tumor Marker   Patient's tumor was tested for the following markers: CA-`125. Results of the tumor marker test revealed 10.7.   09/25/2023 Imaging   CT ABDOMEN PELVIS W CONTRAST Result Date: 09/25/2023 CLINICAL DATA:  Ovarian cancer. In remission since February. * Tracking Code: BO * EXAM: CT ABDOMEN AND PELVIS WITH CONTRAST TECHNIQUE: Multidetector CT imaging of the abdomen and pelvis was performed using the standard protocol following bolus administration of intravenous contrast. RADIATION DOSE REDUCTION: This exam was performed according to the departmental dose-optimization program which includes automated exposure control, adjustment of the mA and/or kV according to patient size and/or use of iterative reconstruction technique. CONTRAST:  80mL OMNIPAQUE  IOHEXOL  300 MG/ML  SOLN COMPARISON:  06/04/2023 FINDINGS: Lower chest: Clear lung bases. Normal heart size without pericardial or pleural effusion. Hepatobiliary: Normal liver. Normal gallbladder, without biliary ductal dilatation. Pancreas: Normal, without mass or ductal dilatation. Spleen: Normal in size, without focal abnormality. Adrenals/Urinary Tract: Normal adrenal glands. Normal kidneys, without hydronephrosis. Normal urinary bladder. Stomach/Bowel: Normal stomach, without wall thickening. Surgical sutures in the rectum. Colonic stool burden suggests constipation. Normal small bowel. Vascular/Lymphatic: Aortic atherosclerosis. No abdominopelvic adenopathy. Reproductive: Hysterectomy.  No adnexal mass. Other: No significant free fluid. No evidence of omental or peritoneal disease. Musculoskeletal: Osteopenia.  Lumbosacral spondylosis. IMPRESSION: 1. No acute process or evidence of metastatic disease in the abdomen or pelvis. 2. Incidental findings, including: Possible  constipation. Aortic Atherosclerosis (ICD10-I70.0). Electronically Signed   By: Rockey Kilts M.D.   On: 09/25/2023 12:39   MM 3D SCREENING MAMMOGRAM BILATERAL BREAST Result Date: 08/29/2023 CLINICAL DATA:  Screening. EXAM: DIGITAL SCREENING BILATERAL MAMMOGRAM WITH TOMOSYNTHESIS AND CAD TECHNIQUE: Bilateral screening digital craniocaudal and mediolateral oblique mammograms were obtained. Bilateral screening digital breast tomosynthesis was performed. The images were evaluated with computer-aided detection. COMPARISON:  Previous exam(s). ACR Breast Density Category b: There are scattered areas of fibroglandular density. FINDINGS: There are no findings suspicious for malignancy. IMPRESSION: No mammographic evidence of malignancy. A result letter of this screening mammogram will be mailed directly to the patient. RECOMMENDATION: Screening mammogram in one year. (Code:[id number]) BI-RADS CATEGORY  1: Negative. Electronically Signed   By: Dirk Arrant M.D.   On: 08/29/2023 13:52      09/26/2023 Tumor Marker   Patient's tumor was tested for the following markers: CA-125. Results of the tumor marker test revealed 9.4.   11/23/2023 Tumor Marker   Patient's tumor was tested for the following markers: CA-125. Results of the tumor marker test revealed 11.6.   12/26/2023 Imaging   Ovarian CT ABD/PELVIS no oral contrast Result Date: 12/26/2023 EXAM: CT ABDOMEN AND PELVIS WITH CONTRAST 12/26/2023 08:49:46 AM TECHNIQUE: CT of the abdomen and pelvis was performed with the administration of intravenous contrast. Multiplanar reformatted images are provided for review. Automated exposure control, iterative reconstruction, and/or weight-based adjustment of the mA/kV was utilized to reduce the radiation dose to as low as reasonably achievable. COMPARISON: 09/25/2023 and previous CLINICAL HISTORY: Ovarian cancer monitor for recurrence. Pt in for surveillance ovarian CA. Currently on a chemo pill. Hx total hysterectomy, appy.  FINDINGS: LOWER CHEST: Visualized lung bases are clear. LIVER: Normal size and contour. GALLBLADDER AND BILE DUCTS: No wall thickening. No cholelithiasis. No  biliary ductal dilatation. SPLEEN: Normal size. No focal lesion. PANCREAS: No mass. No ductal dilatation. ADRENAL GLANDS: Normal appearance. No mass. KIDNEYS, URETERS AND BLADDER: No stones in the kidneys or ureters. No hydronephrosis. No perinephric or periureteral stranding. Urinary bladder is unremarkable. GI AND BOWEL: Stomach demonstrates no acute abnormality. There is no bowel obstruction. No bowel wall thickening. PERITONEUM AND RETROPERITONEUM: No ascites. No free air. VASCULATURE: Minimal aortic plaque. LYMPH NODES: No lymphadenopathy. REPRODUCTIVE ORGANS: No significant abnormality. BONES AND SOFT TISSUES: Mild spondylotic changes in the lumbar spine, stable grade 1 anterolisthesis L4-5. Pelvic phleboliths. IMPRESSION: 1. No evidence of recurrent ovarian cancer. Electronically signed by: Katheleen Faes MD 12/26/2023 01:20 PM EDT RP Workstation: [id number]      03/06/2024 Tumor Marker   Patient's tumor was tested for the following markers: CA-125. Results of the tumor marker test revealed 11.   04/21/2024 Imaging   Ovarian CT ABD/PELVIS no oral contrast Result Date: 04/30/2024 EXAM: CT ABDOMEN AND PELVIS WITH CONTRAST 04/21/2024 09:59:00 AM TECHNIQUE: CT of the abdomen and pelvis was performed with the administration of 100 mL of iohexol  (OMNIPAQUE ) 300 MG/ML solution. Multiplanar reformatted images are provided for review. Automated exposure control, iterative reconstruction, and/or weight-based adjustment of the mA/kV was utilized to reduce the radiation dose to as low as reasonably achievable. COMPARISON: 12/26/2023 and previous. CLINICAL HISTORY: ovarian cancer monitor FINDINGS: LOWER CHEST: No acute abnormality. LIVER: The liver is unremarkable. GALLBLADDER AND BILE DUCTS: Gallbladder is unremarkable. No biliary ductal dilatation. SPLEEN:  No acute abnormality. PANCREAS: No acute abnormality. ADRENAL GLANDS: No acute abnormality. KIDNEYS, URETERS AND BLADDER: No stones in the kidneys or ureters. No hydronephrosis. No perinephric or periureteral stranding. Urinary bladder is unremarkable. GI AND BOWEL: Stomach demonstrates no acute abnormality. Surgical clip near the base of the cecum. Anastomotic staple line near the rectosigmoid junction. A few sigmoid diverticula without adjacent inflammatory change. There is no bowel obstruction. PERITONEUM AND RETROPERITONEUM: No ascites. No free air. VASCULATURE: Aorta is normal in caliber. Minimal calcified aortic plaque. LYMPH NODES: No lymphadenopathy. REPRODUCTIVE ORGANS: Post hysterectomy. BONES AND SOFT TISSUES: Stable grade 1 anterolisthesis L4-5. Stable moderate degenerative disc disease of L5-S1. Bilateral pelvic phleboliths. No acute osseous abnormality. No focal soft tissue abnormality. IMPRESSION: 1. No evidence of metastatic disease. 2. No acute findings in the abdomen or pelvis. Electronically signed by: Dayne Hassell MD 04/30/2024 01:08 PM EST RP Workstation: [id number]        Interval History: Doing well.  Endorses good energy.  Has changed her appetite some, cut out sweets after Christmas.  Endorses good bowel function sometimes with several formed bowel movements a day.  Denies any abdominal or pelvic pain.  Past Medical/Surgical History: Past Medical History:  Diagnosis Date   Anemia    Cancer (HCC)    ovarian   Family history of breast cancer 03/14/2021   Hole, retinal    Right eye   Osteoporosis    Polyp of colon, unspecified part of colon, unspecified type    Benign    Past Surgical History:  Procedure Laterality Date   APPENDECTOMY  02/27/2007   [facility]   BILATERAL SALPINGECTOMY Right 03/1978   right ectopic, tied other tube   BOWEL RESECTION N/A 12/20/2022   Procedure: RECTOSIGMOID RESECTION AND REANASTAMOSIS, MINI LAPAROTOMY;  Surgeon: Viktoria Comer SAUNDERS, MD;   Location: [facility];  Service: Gynecology;  Laterality: N/A;   CATARACT EXTRACTION Right 10/31/2011   Dr. Octavia   COLONOSCOPY  03/2019   Dr. Elicia   COLONOSCOPY WITH  PROPOFOL  N/A 04/01/2018   Procedure: COLONOSCOPY WITH PROPOFOL ;  Surgeon: Elicia Claw, MD;  Location: [facility];  Service: Gastroenterology;  Laterality: N/A;   CYSTOSCOPY  12/20/2022   Procedure: CYSTOSCOPY;  Surgeon: Viktoria Comer SAUNDERS, MD;  Location: [facility];  Service: Gynecology;;   DILATION AND CURETTAGE OF UTERUS     x3, 2 for miscarriages, one for polyp vs fibroid   HAND / FINGER LESION EXCISION Left    lt. index finger   LAPAROSCOPIC HYSTERECTOMY     LAPAROSCOPY N/A 11/11/2020   Procedure: LAPAROSCOPY DIAGNOSTIC WITH PERITONEAL BIOPSY;  Surgeon: Viktoria Comer SAUNDERS, MD;  Location: [facility];  Service: Gynecology;  Laterality: N/A;   POLYPECTOMY  04/01/2018   Procedure: POLYPECTOMY;  Surgeon: Elicia Claw, MD;  Location: [facility];  Service: Gastroenterology;;   RETINAL TEAR REPAIR CRYOTHERAPY Right 04/25/2011   Dr. Elner   ROBOT ASSISTED MYOMECTOMY N/A 12/20/2022   Procedure: DIAGNOSTIC LAPAROSCOPY, XI ROBOTIC ASSISTED PELVIC MASS EXCISION;  Surgeon: Viktoria Comer SAUNDERS, MD;  Location: [facility];  Service: Gynecology;  Laterality: N/A;   SUBMUCOSAL INJECTION  04/01/2018   Procedure: SUBMUCOSAL INJECTION;  Surgeon: Elicia Claw, MD;  Location: [facility];  Service: Gastroenterology;;   TUBAL LIGATION Left 03/1978    Family History  Problem Relation Age of Onset   Lung cancer Mother 67       smoking hx   Skin cancer Father        dx after 50; non-melanoma; scalp   Lung cancer Maternal Aunt        dx after 50; smoking hx   Breast cancer Daughter 40       ER+   Skin cancer Cousin        face; surgery only   Ovarian cancer Neg Hx    Endometrial cancer Neg Hx    Prostate cancer Neg Hx    Pancreatic cancer Neg Hx    Colon cancer Neg Hx     Social History   Socioeconomic History    Marital status: Legally Separated    Spouse name: Not on file   Number of children: Not on file   Years of education: Not on file   Highest education level: Not on file  Occupational History   Not on file  Tobacco Use   Smoking status: Never    Passive exposure: Past (18 years)   Smokeless tobacco: Never  Vaping Use   Vaping status: Never Used  Substance and Sexual Activity   Alcohol use: Not Currently   Drug use: Never   Sexual activity: Not Currently  Other Topics Concern   Not on file  Social History Narrative   Not on file   Social Drivers of Health   Tobacco Use: Low Risk (03/03/2024)   Patient History    Smoking Tobacco Use: Never    Smokeless Tobacco Use: Never    Passive Exposure: Past  Financial Resource Strain: Not on file  Food Insecurity: No Food Insecurity (12/18/2023)   Epic    Worried About Programme Researcher, Broadcasting/film/video in the Last Year: Never true    Ran Out of Food in the Last Year: Never true  Transportation Needs: No Transportation Needs (12/18/2023)   Epic    Lack of Transportation (Medical): No    Lack of Transportation (Non-Medical): No  Physical Activity: Not on file  Stress: Not on file  Social Connections: Not on file  Depression (PHQ2-9): Low Risk (12/18/2023)   Depression (PHQ2-9)    PHQ-2 Score:  0  Alcohol Screen: Not on file  Housing: Unknown (12/18/2023)   Epic    Unable to Pay for Housing in the Last Year: No    Number of Times Moved in the Last Year: Not on file    Homeless in the Last Year: No  Utilities: Not At Risk (12/18/2023)   Epic    Threatened with loss of utilities: No  Health Literacy: Not on file    Current Medications: Current Medications[1]  Review of Systems: + hearing loss Denies appetite changes, fevers, chills, fatigue, unexplained weight changes. Denies neck lumps or masses, mouth sores, ringing in ears or voice changes. Denies cough or wheezing.  Denies shortness of breath. Denies chest pain or palpitations. Denies  leg swelling. Denies abdominal distention, pain, blood in stools, constipation, diarrhea, nausea, vomiting, or early satiety. Denies pain with intercourse, dysuria, frequency, hematuria or incontinence. Denies hot flashes, pelvic pain, vaginal bleeding or vaginal discharge.   Denies joint pain, back pain or muscle pain/cramps. Denies itching, rash, or wounds. Denies dizziness, headaches, numbness or seizures. Denies swollen lymph nodes or glands, denies easy bruising or bleeding. Denies anxiety, depression, confusion, or decreased concentration.  Physical Exam: BP 137/63 (BP Location: Left Arm, Patient Position: Sitting)   Pulse 86   Temp 98.4 F (36.9 C) (Oral)   Resp 18   Wt 115 lb 9.6 oz (52.4 kg)   SpO2 100%   BMI 19.84 kg/m  General: Alert, oriented, no acute distress. HEENT: Normocephalic, atraumatic, sclera anicteric. Chest: Unlabored breathing on room air.  Lungs clear to auscultation bilaterally.  No wheezes or rhonchi. Cardiovascular: Regular rate and rhythm, no murmurs or rubs appreciated. Abdomen: soft, nontender.  Normoactive bowel sounds.  No masses or hepatosplenomegaly appreciated.  Well-healed incisions. Extremities: Grossly normal range of motion.  Warm, well perfused.  No edema bilaterally. Phytex: No cervical, supraclavicular, or inguinal adenopathy. GU: Normal appearing external genitalia without erythema, excoriation, or lesions.  Speculum exam reveals cuff intact, moderate atrophy.  On bimanual exam, cuff is intact, no masses or nodularity.  Findings are confirmed on rectovaginal exam.  Laboratory & Radiologic Studies: 04/21/24: CT A/P 1. No evidence of metastatic disease. 2. No acute findings in the abdomen or pelvis.           Component Ref Range & Units (hover) 11 d ago 2 mo ago 5 mo ago 7 mo ago 9 mo ago 10 mo ago 11 mo ago  Cancer Antigen (CA) 125 10.0 11.0 CM 11.6 CM 9.4 CM 10.7 CM 8.3 CM 9.5 C      Assessment & Plan: Robin Sellers is a  74 y.o. 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 Initially treated with NACT, R0 IDS in 2022, completed adjuvant chermotherapy in 04/2021.  Recurrence noted in the pelvis, now status post secondary debulking surgery (11/2022) with resection of 3 separate lesions, completely excised.  Completed adjuvant carboplatin  and paclitaxel , 6 cycles, 04/2023. Imaging 05/2023 with no metastatic disease. Started Niraparib  in 06/2023. [facility]: ER+, HRD neg, PDL1 CPS 5, P53 abn. ERBB2 negative ([facility] 0). FOLR1 40% (negative).  Doing well, NED on exam today.  Tolerating PARPi without significant AEs.  Reviewed recent imaging, which shows no evidence of recurrent or metastatic disease.  Per NCCN surveillance recommendations, we will continue with visits every 3 months at this time.  She sees Dr. Lonn in February.  I will plan to see her in May.  We reviewed signs and symptoms that should prompt a phone call  before her neck scheduled visit.  20 minutes of total time was spent for this patient encounter, including preparation, face-to-face counseling with the patient and coordination of care, and documentation of the encounter.  Comer Dollar, MD  Division of Gynecologic Oncology  Department of Obstetrics and Gynecology  [facility]      [1]  Current Outpatient Medications:    niraparib  tosylate (ZEJULA ) 100 MG tablet, Take 1 tablet (100 mg total) by mouth daily. May take at bedtime to reduce nausea and vomiting., Disp: 30 tablet, Rfl: 11   risedronate  (ACTONEL ) 150 MG tablet, Take 1 tablet (150 mg total) by mouth once monthly, Disp: 3 tablet, Rfl: 4   ondansetron  (ZOFRAN ) 4 MG tablet, Take 1-2 tablets (4-8 mg total) by mouth every 8 (eight) hours as needed for nausea or vomiting. (Patient not taking: Reported on 12/18/2023), Disp: 10 tablet, Rfl: 0   prochlorperazine  (COMPAZINE ) 10 MG tablet, Take 1 tablet (10 mg total) by mouth every 6 (six) hours as  needed for nausea or vomiting. (Patient not taking: Reported on 12/18/2023), Disp: 30 tablet, Rfl: 1

## 2024-05-09 ENCOUNTER — Other Ambulatory Visit: Payer: Self-pay

## 2024-05-12 ENCOUNTER — Other Ambulatory Visit: Payer: Self-pay

## 2024-05-13 ENCOUNTER — Other Ambulatory Visit: Payer: Self-pay

## 2024-05-14 ENCOUNTER — Other Ambulatory Visit: Payer: Self-pay

## 2024-05-14 NOTE — Progress Notes (Signed)
 Specialty Pharmacy Refill Coordination Note  Robin Sellers is a 74 y.o. female contacted today regarding refills of specialty medication(s) Niraparib  Tosylate (ZEJULA )   Patient requested [name] at [facility] at [place] date: 05/16/24   Medication will be filled on: 05/15/24

## 2024-05-15 ENCOUNTER — Other Ambulatory Visit: Payer: Self-pay

## 2024-05-22 ENCOUNTER — Other Ambulatory Visit: Payer: Self-pay | Admitting: Internal Medicine

## 2024-05-22 ENCOUNTER — Ambulatory Visit
Admission: RE | Admit: 2024-05-22 | Discharge: 2024-05-22 | Disposition: A | Discharge status: Home / Self Care | Source: Ambulatory Visit | Attending: Internal Medicine | Admitting: Internal Medicine

## 2024-05-22 DIAGNOSIS — M109 Gout, unspecified: Secondary | ICD-10-CM

## 2024-05-28 ENCOUNTER — Telehealth: Payer: Self-pay

## 2024-05-28 NOTE — Telephone Encounter (Signed)
 Patient Update: Patient expressed concern about weather conditions and the status of their upcoming appointment. Informed patient that there are no changes at this time per [facility]. We are currently expecting normal business operations. Advised patient that we will reach out if anything changes.

## 2024-05-29 ENCOUNTER — Inpatient Hospital Stay: Attending: Hematology and Oncology

## 2024-05-29 ENCOUNTER — Encounter: Payer: Self-pay | Admitting: Hematology and Oncology

## 2024-05-29 ENCOUNTER — Inpatient Hospital Stay: Admitting: Hematology and Oncology

## 2024-05-29 ENCOUNTER — Other Ambulatory Visit (HOSPITAL_COMMUNITY): Payer: Self-pay

## 2024-05-29 VITALS — BP 135/63 | HR 92 | Temp 97.9°F | Resp 18 | Ht 64.0 in | Wt 111.4 lb

## 2024-05-29 DIAGNOSIS — M79672 Pain in left foot: Secondary | ICD-10-CM

## 2024-05-29 DIAGNOSIS — R7303 Prediabetes: Secondary | ICD-10-CM | POA: Insufficient documentation

## 2024-05-29 DIAGNOSIS — C482 Malignant neoplasm of peritoneum, unspecified: Secondary | ICD-10-CM

## 2024-05-29 DIAGNOSIS — M79671 Pain in right foot: Secondary | ICD-10-CM | POA: Insufficient documentation

## 2024-05-29 LAB — CBC WITH DIFFERENTIAL/PLATELET
Abs Immature Granulocytes: 0.01 10*3/uL (ref 0.00–0.07)
Basophils Absolute: 0 10*3/uL (ref 0.0–0.1)
Basophils Relative: 1 %
Eosinophils Absolute: 0.1 10*3/uL (ref 0.0–0.5)
Eosinophils Relative: 1 %
HCT: 41.9 % (ref 36.0–46.0)
Hemoglobin: 14.5 g/dL (ref 12.0–15.0)
Immature Granulocytes: 0 %
Lymphocytes Relative: 22 %
Lymphs Abs: 1.4 10*3/uL (ref 0.7–4.0)
MCH: 31.4 pg (ref 26.0–34.0)
MCHC: 34.6 g/dL (ref 30.0–36.0)
MCV: 90.7 fL (ref 80.0–100.0)
Monocytes Absolute: 0.3 10*3/uL (ref 0.1–1.0)
Monocytes Relative: 5 %
Neutro Abs: 4.7 10*3/uL (ref 1.7–7.7)
Neutrophils Relative %: 71 %
Platelets: 194 10*3/uL (ref 150–400)
RBC: 4.62 MIL/uL (ref 3.87–5.11)
RDW: 12.3 % (ref 11.5–15.5)
WBC: 6.5 10*3/uL (ref 4.0–10.5)
nRBC: 0 % (ref 0.0–0.2)

## 2024-05-29 LAB — COMPREHENSIVE METABOLIC PANEL WITH GFR
ALT: 18 U/L (ref 0–44)
AST: 27 U/L (ref 15–41)
Albumin: 4.8 g/dL (ref 3.5–5.0)
Alkaline Phosphatase: 53 U/L (ref 38–126)
Anion gap: 10 (ref 5–15)
BUN: 19 mg/dL (ref 8–23)
CO2: 27 mmol/L (ref 22–32)
Calcium: 9.4 mg/dL (ref 8.9–10.3)
Chloride: 102 mmol/L (ref 98–111)
Creatinine, Ser: 0.93 mg/dL (ref 0.44–1.00)
GFR, Estimated: >60 mL/min
Glucose, Bld: 112 mg/dL — ABNORMAL HIGH (ref 70–99)
Potassium: 4.6 mmol/L (ref 3.5–5.1)
Sodium: 139 mmol/L (ref 135–145)
Total Bilirubin: 0.5 mg/dL (ref 0.0–1.2)
Total Protein: 7.5 g/dL (ref 6.5–8.1)

## 2024-05-29 MED ORDER — METFORMIN HCL 500 MG PO TABS
500.0000 mg | ORAL_TABLET | Freq: Every day | ORAL | 0 refills | Status: AC
  Filled 2024-05-29: qty 90, 90d supply, fill #0

## 2024-05-29 NOTE — Assessment & Plan Note (Addendum)
 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  Imaging study in May 2024 showed no evidence of disease but repeat imaging study by August 2024 show evidence of recurrence in the vaginal cuff, amenable to surgical resection The patient is given 6 more cycles of adjuvant chemotherapy with combination of carboplatin  and paclitaxel , completed by January 2025, imaging study from February 2025 showed complete response  She started niraparib  on June 23, 2023 Due to side effects, niraparib  dose was reduced to 100 mg which she tolerated well Imaging study from December 2025 showed no evidence of disease She will continue niraparib  indefinitely Due to high risk for cancer recurrence, we will continue serial imaging study,, next imaging will be in May 2026

## 2024-05-29 NOTE — Progress Notes (Signed)
 [facility] OFFICE PROGRESS NOTE  Patient Care Team: Default, Provider, MD as PCP - General  Assessment & Plan Primary peritoneal carcinomatosis [facility]) The patient was initially diagnosed with primary peritoneal carcinomatosis in July 2022 Due to locally advanced disease, she was deemed not a surgical candidate She received neoadjuvant chemotherapy with excellent response to therapy followed by interval debulking surgery and subsequent completion of adjuvant treatment by January 2023 Pathology: Neg genetics, Molecular testing through [facility] in 2024: HER2/neu 0, negative, F0 LR 2+, 40%, negative, PD-L1 CPS score 5%, ER +75%, HRD negative  By February 2024, she was noted to have rising tumor marker.  Imaging study in May 2024 showed no evidence of disease but repeat imaging study by August 2024 show evidence of recurrence in the vaginal cuff, amenable to surgical resection The patient is given 6 more cycles of adjuvant chemotherapy with combination of carboplatin  and paclitaxel , completed by January 2025, imaging study from February 2025 showed complete response  She started niraparib  on June 23, 2023 Due to side effects, niraparib  dose was reduced to 100 mg which she tolerated well Imaging study from December 2025 showed no evidence of disease She will continue niraparib  indefinitely Due to high risk for cancer recurrence, we will continue serial imaging study,, next imaging will be in May 2026  Bilateral foot pain Examination of both feet revealed signs of overcrowding causing pressure changes to her skin We discussed importance of changing footwear This is not due to recent treatment Prediabetes She has prediabetes for many years We discussed role of CGM We discussed dietary changes I recommend the patient to start metformin  and establish care with a new primary care doctor  No orders of the defined types were placed in this encounter.    Robin Bedford, MD  INTERVAL  HISTORY: she returns for treatment follow-up Overall, she tolerated niraparib  well Complications related to previous cycle of chemotherapy included bilateral foot pain and prediabetes According to the patient, her hemoglobin A1c is in the region of 6.1% and has been elevated for years She does not have a primary care doctor I examined both feet and I do not think this is related to side effects of her treatment I recommend calcium and vitamin D supplement for her osteoporosis  PHYSICAL EXAMINATION: ECOG PERFORMANCE STATUS: 1 - Symptomatic but completely ambulatory  Lab Results  Component Value Date   CAN125 10.0 04/21/2024   CAN125 11.0 03/03/2024   CAN125 11.6 11/22/2023      Latest Ref Rng & Units 05/29/2024    8:58 AM 04/21/2024    8:40 AM 03/03/2024    8:22 AM  CBC  WBC 4.0 - 10.5 K/uL 6.5  4.6  4.6   Hemoglobin 12.0 - 15.0 g/dL 85.4  85.7  85.6   Hematocrit 36.0 - 46.0 % 41.9  41.1  42.7   Platelets 150 - 400 K/uL 194  163  170       Chemistry      Component Value Date/Time   NA 139 05/29/2024 0858   K 4.6 05/29/2024 0858   CL 102 05/29/2024 0858   CO2 27 05/29/2024 0858   BUN 19 05/29/2024 0858   CREATININE 0.93 05/29/2024 0858   CREATININE 0.76 04/13/2023 0828      Component Value Date/Time   CALCIUM 9.4 05/29/2024 0858   ALKPHOS 53 05/29/2024 0858   AST 27 05/29/2024 0858   AST 17 04/13/2023 0828   ALT 18 05/29/2024 0858   ALT 12 04/13/2023  9171   BILITOT 0.5 05/29/2024 0858   BILITOT 0.4 04/13/2023 0828       Vitals:   05/29/24 0937  BP: 135/63  Pulse: 92  Resp: 18  Temp: 97.9 F (36.6 C)  SpO2: 98%   Filed Weights   05/29/24 0937  Weight: 111 lb 6.4 oz (50.5 kg)   Other relevant data reviewed during this visit included CBC, CMP

## 2024-05-29 NOTE — Assessment & Plan Note (Addendum)
 She has prediabetes for many years We discussed role of CGM We discussed dietary changes I recommend the patient to start metformin  and establish care with a new primary care doctor

## 2024-05-29 NOTE — Assessment & Plan Note (Addendum)
 Examination of both feet revealed signs of overcrowding causing pressure changes to her skin We discussed importance of changing footwear This is not due to recent treatment

## 2024-05-30 LAB — CA 125: Cancer Antigen (CA) 125: 10.2 U/mL (ref 0.0–38.1)

## 2024-08-27 ENCOUNTER — Inpatient Hospital Stay: Attending: Hematology and Oncology

## 2024-08-29 ENCOUNTER — Inpatient Hospital Stay: Admitting: Gynecologic Oncology

## 2024-10-20 ENCOUNTER — Inpatient Hospital Stay: Attending: Hematology and Oncology

## 2024-10-27 ENCOUNTER — Inpatient Hospital Stay: Attending: Hematology and Oncology | Admitting: Hematology and Oncology
